# Patient Record
Sex: Male | Born: 1960 | Race: White | Hispanic: No | Smoking: Current every day smoker
Health system: Southern US, Community
[De-identification: ages and names within clinical notes are randomized; demographics above are authoritative.]

## PROBLEM LIST (undated history)

## (undated) DIAGNOSIS — J449 Chronic obstructive pulmonary disease, unspecified: Secondary | ICD-10-CM

## (undated) DIAGNOSIS — F319 Bipolar disorder, unspecified: Secondary | ICD-10-CM

## (undated) DIAGNOSIS — Z8701 Personal history of pneumonia (recurrent): Secondary | ICD-10-CM

## (undated) DIAGNOSIS — G8929 Other chronic pain: Secondary | ICD-10-CM

## (undated) DIAGNOSIS — M719 Bursopathy, unspecified: Secondary | ICD-10-CM

## (undated) DIAGNOSIS — F419 Anxiety disorder, unspecified: Secondary | ICD-10-CM

## (undated) DIAGNOSIS — D6859 Other primary thrombophilia: Secondary | ICD-10-CM

## (undated) DIAGNOSIS — B1920 Unspecified viral hepatitis C without hepatic coma: Secondary | ICD-10-CM

## (undated) DIAGNOSIS — J189 Pneumonia, unspecified organism: Secondary | ICD-10-CM

## (undated) DIAGNOSIS — I2699 Other pulmonary embolism without acute cor pulmonale: Secondary | ICD-10-CM

## (undated) DIAGNOSIS — IMO0002 Reserved for concepts with insufficient information to code with codable children: Secondary | ICD-10-CM

## (undated) DIAGNOSIS — B1910 Unspecified viral hepatitis B without hepatic coma: Secondary | ICD-10-CM

## (undated) DIAGNOSIS — K219 Gastro-esophageal reflux disease without esophagitis: Secondary | ICD-10-CM

## (undated) DIAGNOSIS — K449 Diaphragmatic hernia without obstruction or gangrene: Secondary | ICD-10-CM

## (undated) DIAGNOSIS — I639 Cerebral infarction, unspecified: Secondary | ICD-10-CM

## (undated) DIAGNOSIS — F209 Schizophrenia, unspecified: Secondary | ICD-10-CM

## (undated) DIAGNOSIS — K7460 Unspecified cirrhosis of liver: Secondary | ICD-10-CM

## (undated) DIAGNOSIS — J45909 Unspecified asthma, uncomplicated: Secondary | ICD-10-CM

## (undated) DIAGNOSIS — M1990 Unspecified osteoarthritis, unspecified site: Secondary | ICD-10-CM

## (undated) HISTORY — DX: Other primary thrombophilia: D68.59

## (undated) HISTORY — PX: CHOLECYSTECTOMY: SHX55

## (undated) HISTORY — PX: LEG SURGERY: SHX1003

## (undated) HISTORY — DX: Other chronic pain: G89.29

## (undated) HISTORY — DX: Personal history of pneumonia (recurrent): Z87.01

## (undated) HISTORY — PX: HAND SURGERY: SHX662

## (undated) HISTORY — DX: Bipolar disorder, unspecified: F31.9

## (undated) HISTORY — PX: UMBILICAL HERNIA REPAIR: SHX196

---

## 1998-08-20 ENCOUNTER — Emergency Department (HOSPITAL_COMMUNITY): Admission: EM | Admit: 1998-08-20 | Discharge: 1998-08-20 | Payer: Self-pay | Admitting: Emergency Medicine

## 1998-08-20 ENCOUNTER — Encounter: Payer: Self-pay | Admitting: Emergency Medicine

## 1999-02-11 ENCOUNTER — Emergency Department (HOSPITAL_COMMUNITY): Admission: EM | Admit: 1999-02-11 | Discharge: 1999-02-11 | Payer: Self-pay | Admitting: Emergency Medicine

## 1999-08-31 ENCOUNTER — Emergency Department (HOSPITAL_COMMUNITY): Admission: EM | Admit: 1999-08-31 | Discharge: 1999-08-31 | Payer: Self-pay | Admitting: *Deleted

## 2003-12-18 ENCOUNTER — Emergency Department (HOSPITAL_COMMUNITY): Admission: EM | Admit: 2003-12-18 | Discharge: 2003-12-18 | Payer: Self-pay | Admitting: Emergency Medicine

## 2004-11-28 ENCOUNTER — Emergency Department (HOSPITAL_COMMUNITY): Admission: EM | Admit: 2004-11-28 | Discharge: 2004-11-28 | Payer: Self-pay | Admitting: Emergency Medicine

## 2005-08-22 ENCOUNTER — Emergency Department (HOSPITAL_COMMUNITY): Admission: EM | Admit: 2005-08-22 | Discharge: 2005-08-22 | Payer: Self-pay | Admitting: Emergency Medicine

## 2007-04-29 ENCOUNTER — Emergency Department (HOSPITAL_COMMUNITY): Admission: EM | Admit: 2007-04-29 | Discharge: 2007-04-29 | Payer: Self-pay | Admitting: Emergency Medicine

## 2007-11-26 ENCOUNTER — Emergency Department (HOSPITAL_COMMUNITY): Admission: EM | Admit: 2007-11-26 | Discharge: 2007-11-26 | Payer: Self-pay | Admitting: Emergency Medicine

## 2008-01-30 ENCOUNTER — Inpatient Hospital Stay (HOSPITAL_COMMUNITY): Admission: EM | Admit: 2008-01-30 | Discharge: 2008-02-03 | Payer: Self-pay | Admitting: Emergency Medicine

## 2008-02-14 ENCOUNTER — Emergency Department (HOSPITAL_COMMUNITY): Admission: EM | Admit: 2008-02-14 | Discharge: 2008-02-14 | Payer: Self-pay | Admitting: Emergency Medicine

## 2008-04-24 ENCOUNTER — Emergency Department (HOSPITAL_COMMUNITY): Admission: EM | Admit: 2008-04-24 | Discharge: 2008-04-24 | Payer: Self-pay | Admitting: Emergency Medicine

## 2008-10-18 ENCOUNTER — Emergency Department (HOSPITAL_COMMUNITY): Admission: EM | Admit: 2008-10-18 | Discharge: 2008-10-18 | Payer: Self-pay | Admitting: Emergency Medicine

## 2008-12-19 ENCOUNTER — Emergency Department (HOSPITAL_COMMUNITY): Admission: EM | Admit: 2008-12-19 | Discharge: 2008-12-20 | Payer: Self-pay | Admitting: Emergency Medicine

## 2009-03-03 ENCOUNTER — Emergency Department (HOSPITAL_COMMUNITY): Admission: EM | Admit: 2009-03-03 | Discharge: 2009-03-03 | Payer: Self-pay | Admitting: Emergency Medicine

## 2009-08-22 ENCOUNTER — Ambulatory Visit (HOSPITAL_COMMUNITY): Admission: RE | Admit: 2009-08-22 | Discharge: 2009-08-22 | Payer: Self-pay

## 2009-08-26 ENCOUNTER — Emergency Department (HOSPITAL_COMMUNITY): Admission: EM | Admit: 2009-08-26 | Discharge: 2009-08-26 | Payer: Self-pay | Admitting: Emergency Medicine

## 2009-10-31 ENCOUNTER — Ambulatory Visit (HOSPITAL_COMMUNITY)
Admission: RE | Admit: 2009-10-31 | Discharge: 2009-10-31 | Payer: Self-pay | Source: Home / Self Care | Admitting: General Surgery

## 2009-11-03 ENCOUNTER — Emergency Department (HOSPITAL_COMMUNITY): Admission: EM | Admit: 2009-11-03 | Discharge: 2009-11-03 | Payer: Self-pay | Admitting: Emergency Medicine

## 2010-01-22 ENCOUNTER — Inpatient Hospital Stay (HOSPITAL_COMMUNITY)
Admission: EM | Admit: 2010-01-22 | Discharge: 2010-01-26 | Payer: Self-pay | Source: Home / Self Care | Admitting: Emergency Medicine

## 2010-04-14 DIAGNOSIS — I2699 Other pulmonary embolism without acute cor pulmonale: Secondary | ICD-10-CM

## 2010-04-14 HISTORY — DX: Other pulmonary embolism without acute cor pulmonale: I26.99

## 2010-04-19 ENCOUNTER — Ambulatory Visit (HOSPITAL_COMMUNITY)
Admission: RE | Admit: 2010-04-19 | Discharge: 2010-04-19 | Payer: Self-pay | Source: Home / Self Care | Attending: General Surgery | Admitting: General Surgery

## 2010-06-16 ENCOUNTER — Emergency Department (HOSPITAL_COMMUNITY): Admission: EM | Admit: 2010-06-16 | Payer: Medicare (Managed Care) | Source: Home / Self Care

## 2010-06-24 LAB — BASIC METABOLIC PANEL
CO2: 26 mEq/L (ref 19–32)
Calcium: 9.1 mg/dL (ref 8.4–10.5)
Chloride: 105 mEq/L (ref 96–112)
GFR calc Af Amer: >60 mL/min (ref >60)
Sodium: 141 mEq/L (ref 135–145)

## 2010-06-24 LAB — CBC
Hemoglobin: 12.7 g/dL — ABNORMAL LOW (ref 13.0–17.0)
MCH: 30 pg (ref 26.0–34.0)
Platelets: 181 10*3/uL (ref 150–400)
RBC: 4.24 MIL/uL (ref 4.22–5.81)
WBC: 6.9 10*3/uL (ref 4.0–10.5)

## 2010-06-24 LAB — SURGICAL PCR SCREEN: Staphylococcus aureus: POSITIVE — AB

## 2010-06-26 LAB — DIFFERENTIAL
Basophils Relative: 0 % (ref 0–1)
Basophils Relative: 0 % (ref 0–1)
Eosinophils Absolute: 0.1 10*3/uL (ref 0.0–0.7)
Eosinophils Absolute: 0.1 10*3/uL (ref 0.0–0.7)
Eosinophils Absolute: 0.1 10*3/uL (ref 0.0–0.7)
Eosinophils Relative: 1 % (ref 0–5)
Eosinophils Relative: 2 % (ref 0–5)
Lymphocytes Relative: 17 % (ref 12–46)
Lymphocytes Relative: 22 % (ref 12–46)
Lymphocytes Relative: 24 % (ref 12–46)
Lymphs Abs: 1.2 10*3/uL (ref 0.7–4.0)
Lymphs Abs: 1.5 10*3/uL (ref 0.7–4.0)
Lymphs Abs: 1.5 10*3/uL (ref 0.7–4.0)
Lymphs Abs: 1.6 10*3/uL (ref 0.7–4.0)
Lymphs Abs: 1.8 10*3/uL (ref 0.7–4.0)
Monocytes Absolute: 0.6 10*3/uL (ref 0.1–1.0)
Monocytes Relative: 11 % (ref 3–12)
Monocytes Relative: 13 % — ABNORMAL HIGH (ref 3–12)
Monocytes Relative: 9 % (ref 3–12)
Neutro Abs: 4.6 10*3/uL (ref 1.7–7.7)
Neutro Abs: 4.6 10*3/uL (ref 1.7–7.7)
Neutro Abs: 5.6 10*3/uL (ref 1.7–7.7)
Neutro Abs: 6.4 10*3/uL (ref 1.7–7.7)
Neutrophils Relative %: 67 % (ref 43–77)
Neutrophils Relative %: 70 % (ref 43–77)
Neutrophils Relative %: 72 % (ref 43–77)

## 2010-06-26 LAB — HEPARIN LEVEL (UNFRACTIONATED)
Heparin Unfractionated: <0.1 IU/mL — ABNORMAL LOW (ref 0.30–0.70)
Heparin Unfractionated: <0.1 IU/mL — ABNORMAL LOW (ref 0.30–0.70)

## 2010-06-26 LAB — LUPUS ANTICOAGULANT PANEL
DRVVT: 41.7 secs (ref 36.2–44.3)
Lupus Anticoagulant: NOT DETECTED

## 2010-06-26 LAB — CULTURE, BLOOD (ROUTINE X 2)

## 2010-06-26 LAB — BETA-2-GLYCOPROTEIN I ABS, IGG/M/A
Beta-2 Glyco I IgG: 8 G Units (ref <20)
Beta-2-Glycoprotein I IgM: 7 M Units (ref <20)

## 2010-06-26 LAB — CBC
HCT: 36.2 % — ABNORMAL LOW (ref 39.0–52.0)
HCT: 37.1 % — ABNORMAL LOW (ref 39.0–52.0)
HCT: 38.2 % — ABNORMAL LOW (ref 39.0–52.0)
Hemoglobin: 11.9 g/dL — ABNORMAL LOW (ref 13.0–17.0)
Hemoglobin: 12.3 g/dL — ABNORMAL LOW (ref 13.0–17.0)
MCH: 30.9 pg (ref 26.0–34.0)
MCH: 31.1 pg (ref 26.0–34.0)
MCHC: 33.8 g/dL (ref 30.0–36.0)
MCV: 91.2 fL (ref 78.0–100.0)
MCV: 91.8 fL (ref 78.0–100.0)
Platelets: 144 10*3/uL — ABNORMAL LOW (ref 150–400)
Platelets: 189 10*3/uL (ref 150–400)
RBC: 3.8 MIL/uL — ABNORMAL LOW (ref 4.22–5.81)
RBC: 3.84 MIL/uL — ABNORMAL LOW (ref 4.22–5.81)
RBC: 3.98 MIL/uL — ABNORMAL LOW (ref 4.22–5.81)
RBC: 4.07 MIL/uL — ABNORMAL LOW (ref 4.22–5.81)
RBC: 4.16 MIL/uL — ABNORMAL LOW (ref 4.22–5.81)
RDW: 14.1 % (ref 11.5–15.5)
WBC: 7 10*3/uL (ref 4.0–10.5)
WBC: 7.5 10*3/uL (ref 4.0–10.5)
WBC: 8.6 10*3/uL (ref 4.0–10.5)
WBC: 9.1 10*3/uL (ref 4.0–10.5)

## 2010-06-26 LAB — PROTIME-INR
INR: 1.11 (ref 0.00–1.49)
INR: 1.34 (ref 0.00–1.49)

## 2010-06-26 LAB — CARDIOLIPIN ANTIBODIES, IGG, IGM, IGA
Anticardiolipin IgG: 5 GPL U/mL — ABNORMAL LOW (ref <23)
Anticardiolipin IgM: 8 MPL U/mL — ABNORMAL LOW (ref <11)

## 2010-06-26 LAB — BASIC METABOLIC PANEL
CO2: 26 mEq/L (ref 19–32)
Calcium: 8.5 mg/dL (ref 8.4–10.5)
Calcium: 8.6 mg/dL (ref 8.4–10.5)
Calcium: 8.7 mg/dL (ref 8.4–10.5)
Chloride: 107 mEq/L (ref 96–112)
Creatinine, Ser: 0.59 mg/dL (ref 0.4–1.5)
Creatinine, Ser: 0.67 mg/dL (ref 0.4–1.5)
GFR calc Af Amer: >60 mL/min (ref >60)
GFR calc Af Amer: >60 mL/min (ref >60)
GFR calc Af Amer: >60 mL/min (ref >60)
GFR calc Af Amer: >60 mL/min (ref >60)
GFR calc non Af Amer: >60 mL/min (ref >60)
GFR calc non Af Amer: >60 mL/min (ref >60)
Glucose, Bld: 104 mg/dL — ABNORMAL HIGH (ref 70–99)
Potassium: 3.7 mEq/L (ref 3.5–5.1)
Potassium: 3.8 mEq/L (ref 3.5–5.1)
Sodium: 135 mEq/L (ref 135–145)
Sodium: 142 mEq/L (ref 135–145)

## 2010-06-26 LAB — COMPREHENSIVE METABOLIC PANEL
ALT: 39 U/L (ref 0–53)
Albumin: 2.9 g/dL — ABNORMAL LOW (ref 3.5–5.2)
Alkaline Phosphatase: 44 U/L (ref 39–117)
BUN: 10 mg/dL (ref 6–23)
Chloride: 101 mEq/L (ref 96–112)
Glucose, Bld: 116 mg/dL — ABNORMAL HIGH (ref 70–99)
Potassium: 3.7 mEq/L (ref 3.5–5.1)
Sodium: 137 mEq/L (ref 135–145)
Total Bilirubin: 0.6 mg/dL (ref 0.3–1.2)
Total Protein: 6.7 g/dL (ref 6.0–8.3)

## 2010-06-26 LAB — PROTEIN C, TOTAL: Protein C, Total: 108 % (ref 70–140)

## 2010-06-29 LAB — CBC
HCT: 41.4 % (ref 39.0–52.0)
Hemoglobin: 15.1 g/dL (ref 13.0–17.0)
MCH: 31.1 pg (ref 26.0–34.0)
MCHC: 33.5 g/dL (ref 30.0–36.0)
Platelets: 146 10*3/uL — ABNORMAL LOW (ref 150–400)
Platelets: 182 10*3/uL (ref 150–400)
RBC: 4.84 MIL/uL (ref 4.22–5.81)
RDW: 13.6 % (ref 11.5–15.5)
WBC: 13.6 10*3/uL — ABNORMAL HIGH (ref 4.0–10.5)
WBC: 9.1 10*3/uL (ref 4.0–10.5)

## 2010-06-29 LAB — COMPREHENSIVE METABOLIC PANEL
Albumin: 3.5 g/dL (ref 3.5–5.2)
Alkaline Phosphatase: 63 U/L (ref 39–117)
BUN: 12 mg/dL (ref 6–23)
Calcium: 9.1 mg/dL (ref 8.4–10.5)
Glucose, Bld: 102 mg/dL — ABNORMAL HIGH (ref 70–99)
Potassium: 3.7 mEq/L (ref 3.5–5.1)
Sodium: 137 mEq/L (ref 135–145)
Total Protein: 7.5 g/dL (ref 6.0–8.3)

## 2010-06-29 LAB — URINALYSIS, ROUTINE W REFLEX MICROSCOPIC
Glucose, UA: NEGATIVE mg/dL
Leukocytes, UA: NEGATIVE
Specific Gravity, Urine: 1.025 (ref 1.005–1.030)
pH: 7.5 (ref 5.0–8.0)

## 2010-06-29 LAB — URINE MICROSCOPIC-ADD ON

## 2010-06-29 LAB — DIFFERENTIAL
Lymphs Abs: 2 10*3/uL (ref 0.7–4.0)
Monocytes Absolute: 1 10*3/uL (ref 0.1–1.0)
Monocytes Relative: 7 % (ref 3–12)
Neutro Abs: 10.5 10*3/uL — ABNORMAL HIGH (ref 1.7–7.7)
Neutrophils Relative %: 77 % (ref 43–77)

## 2010-06-29 LAB — HEPATIC FUNCTION PANEL
ALT: 38 U/L (ref 0–53)
AST: 23 U/L (ref 0–37)
Albumin: 4 g/dL (ref 3.5–5.2)
Alkaline Phosphatase: 56 U/L (ref 39–117)
Total Protein: 7 g/dL (ref 6.0–8.3)

## 2010-06-29 LAB — BASIC METABOLIC PANEL
CO2: 25 mEq/L (ref 19–32)
Calcium: 9.6 mg/dL (ref 8.4–10.5)
GFR calc Af Amer: >60 mL/min (ref >60)
GFR calc non Af Amer: >60 mL/min (ref >60)
Sodium: 137 mEq/L (ref 135–145)

## 2010-06-29 LAB — URINE CULTURE: Colony Count: 1000

## 2010-07-01 LAB — DIFFERENTIAL
Eosinophils Absolute: 0.1 10*3/uL (ref 0.0–0.7)
Lymphocytes Relative: 7 % — ABNORMAL LOW (ref 12–46)
Lymphs Abs: 1 10*3/uL (ref 0.7–4.0)
Neutro Abs: 12 10*3/uL — ABNORMAL HIGH (ref 1.7–7.7)
Neutrophils Relative %: 86 % — ABNORMAL HIGH (ref 43–77)

## 2010-07-01 LAB — URINALYSIS, ROUTINE W REFLEX MICROSCOPIC
Glucose, UA: NEGATIVE mg/dL
Protein, ur: NEGATIVE mg/dL
Specific Gravity, Urine: 1.02 (ref 1.005–1.030)

## 2010-07-01 LAB — BASIC METABOLIC PANEL
BUN: 18 mg/dL (ref 6–23)
Calcium: 9.1 mg/dL (ref 8.4–10.5)
Creatinine, Ser: 0.86 mg/dL (ref 0.4–1.5)
GFR calc Af Amer: >60 mL/min (ref >60)
GFR calc non Af Amer: >60 mL/min (ref >60)

## 2010-07-01 LAB — CBC
MCV: 93.1 fL (ref 78.0–100.0)
Platelets: 136 10*3/uL — ABNORMAL LOW (ref 150–400)
RBC: 4.3 MIL/uL (ref 4.22–5.81)
WBC: 14 10*3/uL — ABNORMAL HIGH (ref 4.0–10.5)

## 2010-07-19 LAB — RAPID STREP SCREEN (MED CTR MEBANE ONLY): Streptococcus, Group A Screen (Direct): NEGATIVE

## 2010-07-19 LAB — CBC
Hemoglobin: 14.7 g/dL (ref 13.0–17.0)
Platelets: 115 10*3/uL — ABNORMAL LOW (ref 150–400)
RDW: 13 % (ref 11.5–15.5)
WBC: 11.9 10*3/uL — ABNORMAL HIGH (ref 4.0–10.5)

## 2010-07-19 LAB — ETHANOL: Alcohol, Ethyl (B): <5 mg/dL (ref 0–10)

## 2010-07-19 LAB — DIFFERENTIAL
Basophils Absolute: 0 10*3/uL (ref 0.0–0.1)
Lymphocytes Relative: 11 % — ABNORMAL LOW (ref 12–46)
Lymphs Abs: 1.3 10*3/uL (ref 0.7–4.0)
Monocytes Absolute: 0.6 10*3/uL (ref 0.1–1.0)
Neutro Abs: 9.9 10*3/uL — ABNORMAL HIGH (ref 1.7–7.7)

## 2010-07-19 LAB — POCT I-STAT, CHEM 8
BUN: 14 mg/dL (ref 6–23)
Calcium, Ion: 1.14 mmol/L (ref 1.12–1.32)
Creatinine, Ser: 1 mg/dL (ref 0.4–1.5)
Glucose, Bld: 101 mg/dL — ABNORMAL HIGH (ref 70–99)
TCO2: 23 mmol/L (ref 0–100)

## 2010-07-19 LAB — RAPID URINE DRUG SCREEN, HOSP PERFORMED
Cocaine: NOT DETECTED
Opiates: NOT DETECTED

## 2010-07-24 ENCOUNTER — Emergency Department (HOSPITAL_COMMUNITY)
Admission: EM | Admit: 2010-07-24 | Discharge: 2010-07-24 | Disposition: A | Discharge status: Home / Self Care | Payer: Medicare (Managed Care) | Attending: Emergency Medicine | Admitting: Emergency Medicine

## 2010-07-24 DIAGNOSIS — F17200 Nicotine dependence, unspecified, uncomplicated: Secondary | ICD-10-CM | POA: Insufficient documentation

## 2010-07-24 DIAGNOSIS — R42 Dizziness and giddiness: Secondary | ICD-10-CM | POA: Insufficient documentation

## 2010-07-24 DIAGNOSIS — E876 Hypokalemia: Secondary | ICD-10-CM | POA: Insufficient documentation

## 2010-07-24 DIAGNOSIS — M549 Dorsalgia, unspecified: Secondary | ICD-10-CM | POA: Insufficient documentation

## 2010-07-24 DIAGNOSIS — K219 Gastro-esophageal reflux disease without esophagitis: Secondary | ICD-10-CM | POA: Insufficient documentation

## 2010-07-24 DIAGNOSIS — I2699 Other pulmonary embolism without acute cor pulmonale: Secondary | ICD-10-CM | POA: Insufficient documentation

## 2010-07-24 DIAGNOSIS — Z8739 Personal history of other diseases of the musculoskeletal system and connective tissue: Secondary | ICD-10-CM | POA: Insufficient documentation

## 2010-07-24 DIAGNOSIS — R112 Nausea with vomiting, unspecified: Secondary | ICD-10-CM | POA: Insufficient documentation

## 2010-07-24 DIAGNOSIS — G8929 Other chronic pain: Secondary | ICD-10-CM | POA: Insufficient documentation

## 2010-07-24 LAB — BASIC METABOLIC PANEL
Chloride: 111 mEq/L (ref 96–112)
Creatinine, Ser: 0.66 mg/dL (ref 0.4–1.5)
GFR calc Af Amer: >60 mL/min (ref >60)
GFR calc non Af Amer: >60 mL/min (ref >60)
Potassium: 3.2 mEq/L — ABNORMAL LOW (ref 3.5–5.1)

## 2010-07-24 LAB — URINALYSIS, ROUTINE W REFLEX MICROSCOPIC
Glucose, UA: NEGATIVE mg/dL
Protein, ur: NEGATIVE mg/dL
Specific Gravity, Urine: 1.015 (ref 1.005–1.030)
pH: 7 (ref 5.0–8.0)

## 2010-07-24 LAB — CBC
MCH: 30.2 pg (ref 26.0–34.0)
Platelets: 207 10*3/uL (ref 150–400)
RBC: 4.74 MIL/uL (ref 4.22–5.81)
WBC: 5.8 10*3/uL (ref 4.0–10.5)

## 2010-07-24 LAB — DIFFERENTIAL
Eosinophils Absolute: 0.1 10*3/uL (ref 0.0–0.7)
Lymphocytes Relative: 26 % (ref 12–46)
Lymphs Abs: 1.5 10*3/uL (ref 0.7–4.0)
Neutrophils Relative %: 66 % (ref 43–77)

## 2010-07-24 LAB — PROTIME-INR
INR: 2.31 — ABNORMAL HIGH (ref 0.00–1.49)
Prothrombin Time: 25.5 seconds — ABNORMAL HIGH (ref 11.6–15.2)

## 2010-08-27 NOTE — Group Therapy Note (Signed)
NAMEMACARI, ZALESKY                 ACCOUNT NO.:  [id number]   MEDICAL RECORD NO.:  [id number]          PATIENT TYPE:  INP   LOCATION:  A341                          FACILITY:  [facility]   PHYSICIAN:  Edward L. Juanetta Gosling, M.D.DATE OF BIRTH:  24-Aug-1960   DATE OF PROCEDURE:  DATE OF DISCHARGE:                                 PROGRESS NOTE   PATIENT OF:  [facility].   Mr. Hasten is admitted with pneumonia.  He has been in isolation because  of concerns about a cavity.  I do not think that is necessary after his  CT.   His temperature is 98.1, pulse 45, respirations 22, blood pressure  128/88, O2 sats 93% on room air.   His respiratory cultures so far are normal oropharyngeal flora.   White count 9000, hemoglobin 12, platelets 182.  BMET essentially  normal.  Glucose was 121.  AFB smear is negative so far, and blood  cultures are negative so far.   I think with him having pneumonia by CT, some reactive adenopathy, with  no pulmonary mass, and no cavity, I do not think we need to have him  necessarily in isolation.  Otherwise, we would continue his treatments  and follow.      Edward L. Juanetta Gosling, M.D.  Electronically Signed     ELH/MEDQ  D:  02/02/2008  T:  02/02/2008  Job:  [id number]

## 2010-08-27 NOTE — Consult Note (Signed)
[name], [name]                 ACCOUNT NO.:  [id number]   MEDICAL RECORD NO.:  [id number]          PATIENT TYPE:  INP   LOCATION:  IC08                          FACILITY:  [facility]   PHYSICIAN:  Edward L. Juanetta Gosling, M.D.DATE OF BIRTH:  1960/07/15   DATE OF CONSULTATION:  DATE OF DISCHARGE:                                 CONSULTATION   SUBJECTIVE:  [name] is a 50 year old who came to the emergency room  because of cough.  He says he has had this for several days.  It has  been getting worse.  He says he has had multiple bouts of pneumonia in  the past and he was concerned that he had pneumonia.  He has had fever,  he says to a 105.  He has coughed up a brownish red sputum.  He has had  headache and myalgias.  He did not have any chest pain or he did have  chills.  He had fever and he is coughing up very rusty brown sputum.   PAST MEDICAL HISTORY:  Positive for what is apparently osteoarthritis,  gastroesophageal reflux disease, and previous bouts of pneumonia.  He  has been to [facility] on at least two occasions and there is some question  about whether he had some sort of a abnormality on his chest x-ray.  We  are going to need to get those films.   SOCIAL HISTORY:  He does not use any alcohol or illicit drugs.  He  smokes about a pack or more of cigarettes daily.  He is on no  prescription medications.   REVIEW OF SYSTEMS:  Except as mentioned is negative.   PHYSICAL EXAMINATION:  GENERAL:  Well-developed and well-nourished male  who does not appear to be in any acute distress, but he feels much  better.  VITAL SIGNS:  His pulses in the 90s, blood pressure 106/53, respirations  16, and O2 sats 96%.  HEENT:  His pupils are reactive .  His nose and throat are clear.  NECK:  Supple.  CHEST:  Rhonchi bilaterally.  HEART:  Regular.  ABDOMEN:  Soft.   LABORATORY DATA:  Lab work this morning blood gas on 2 liters pO2 is 62,  pCO2 of 34, and pH 7.35.  Blood gas yesterday showed no  more problem  with oxygenation.  White count was 9800, hemoglobin 13.3, platelets 114  compared to a white count of 17,500 yesterday.  Common Metabolic profile  showed a glucose of 167, potassium of 3.2, and albumin was 2.7.  Cardiac  panel was normal.  Magnesium 2.2, phosphorus 1.4.  PT and INR normal.  Chest x-ray shows no left upper lobe air space disease with some  cavitation.  He has had previous x-rays, which showed  some problems, so we need to try to get that information from [place].  If  he has a cavitary pneumonia, and has been there before that would bring  to my and the possibility of fungal disease, TB, etc.  I think, we  should continue with his chronic treatments with medications for now.  Edward L. Juanetta Gosling, M.D.  Electronically Signed     ELH/MEDQ  D:  01/31/2008  T:  01/31/2008  Job:  [id number]

## 2010-08-27 NOTE — Group Therapy Note (Signed)
[name], [name]                 ACCOUNT NO.:  [id number]   MEDICAL RECORD NO.:  [id number]          PATIENT TYPE:  INP   LOCATION:  A341                          FACILITY:  [facility]   PHYSICIAN:  Edward L. Juanetta Gosling, M.D.DATE OF BIRTH:  July 19, 1960   DATE OF PROCEDURE:  DATE OF DISCHARGE:                                 PROGRESS NOTE   [name] is improving.  He still remains in the respiratory isolation.   His temperature is 97.7, pulse 66, respirations 20, blood pressure  118/71.  He has one AFB smear that is negative so far.  I think it is  unlikely that this represents tuberculosis and I do not think he has to  stay here until three AFBs are done, if he is otherwise okay to go.  I  am going to plan to follow more peripherally.   Thank you very much for allowing me to see him with you.      Edward L. Juanetta Gosling, M.D.  Electronically Signed     ELH/MEDQ  D:  02/03/2008  T:  02/03/2008  Job:  [id number]

## 2010-08-27 NOTE — Group Therapy Note (Signed)
NAMEALPER, GUILMETTE NO.:  [id number]   MEDICAL RECORD NO.:  [id number]          PATIENT TYPE:  INP   LOCATION:  A341                          FACILITY:  [facility]   PHYSICIAN:  Dorris Singh, DO    DATE OF BIRTH:  [birth year]   DATE OF PROCEDURE:  02/01/2008  DATE OF DISCHARGE:                                 PROGRESS NOTE   The patient seen today in the ICU and transferred to the floor,  continuing to do well.  At this point in time, it was determined that  the patient could be discharged to the general floor in an isolation  room.  We have obtained 3 sputums for his AFB, and we will continue to  monitor this with a concern of possible TB.  However, the patient has  improved greatly and we will continue to monitor him.   VITAL SIGNS:  Blood pressure 124/86, temperature 97.2, pulse 93,  respirations 20.   PHYSICAL EXAM:  GENERAL:  The patient is a 50 year old Caucasian male  who is well-developed, well-nourished in no acute distress.  HEART:  Regular rate and rhythm.  LUNGS:  Clear to auscultation bilaterally.  Decreased breath sounds,  however.  ABDOMEN:  Soft, nontender, nondistended.  EXTREMITIES:  Positive pulses.   White count today is 10.6, hemoglobin 11.8, hematocrit 34.7, platelet  count of 149.  His sodium is 139, potassium 3.5, chloride 113, glucose  159, BUN 14, creatinine 0.61.   ASSESSMENT AND PLAN:  1. Respiratory distress.  The patient is improved.  2. Pneumonia.  The patient is improved clinically as well.  He is able      to be transported out of the ICU.  3. Suspicion for tuberculosis.  The patient has a CT ordered, but I      have not seen the CT done, however.  I will double-check to see if      it is going to be accomplished or not.  Will continue to follow the      patient and change therapy as necessary.      Dorris Singh, DO  Electronically Signed     CB/MEDQ  D:  02/01/2008  T:  02/01/2008  Job:  [id number]

## 2010-08-27 NOTE — H&P (Signed)
NAMEZYAIR, RUSSI NO.:  [id number]   MEDICAL RECORD NO.:  [id number]          PATIENT TYPE:  INP   LOCATION:  IC09                          FACILITY:  [facility]   PHYSICIAN:  Dorris Singh, DO    DATE OF BIRTH:  [birth year]   DATE OF ADMISSION:  01/30/2008  DATE OF DISCHARGE:  LH                              HISTORY & PHYSICAL   The patient is a 50 year old Caucasian male who presented to the [facility] emergency room with a chief complaint of cough.  States the cough  has been persistent for the last 2 days.  He also has had fever and has  been getting worse.  He also is complaining of a brown to red sputum and  headache and diffuse myalgias.  Also has sick contacts in the family but  states this is the worst he has felt in a long time.  Nothing was making  it better, everything was making it worse.  He does deny any chest pain.  Positive for nausea.  Negative for vomiting.  No diarrhea.   PAST MEDICAL HISTORY:  Significant for arthritis, GERD, hiatal hernia  and pneumonia.   SOCIAL HISTORY:  He currently is a nondrinker.  No drug abuse but he  does smoke.  He does not have a travel history.   ALLERGIES:  No known drug allergies.   MEDICATIONS:  Currently he has been taking medications Tylenol over-the-  counter as needed and per directed.   REVIEW OF SYSTEMS:  CONSTITUTIONAL:  Positive for fever, chills and  appetite change.  EYES:  Negative.  EARS, NOSE, MOUTH, THROAT:  Positive  sore throat.  HEART:  Negative chest pain, palpitations.  LUNGS:  Positive crackles.  ABDOMEN:  Nausea.  MUSCULOSKELETAL:  Positive  myalgias.  GU:  Negative.   PHYSICAL EXAM:  VITAL SIGNS:  Blood pressure 131/74, pulse rate 96,  respirations 24, temperature 99.  GENERAL:  The patient is a well-developed, well-nourished Caucasian male  who is currently in mild distress.  HEENT:  Head is normocephalic, atraumatic.  Eyes are PERRLA.  EOMI.  PMI  is visualized.  No erythema.   Nose turbinates are moist.  Mouth, the  patient currently is on nonrebreather but mouth is clear.  Mucous  membranes are dry.  NECK:  Neck is supple.  Full range of motion.  No lymphadenopathy noted.  No meningeal signs.  CARDIOVASCULAR:  Tachycardic.  No murmur, rub or gallop noted.  RESPIRATORY:  There are decreased breath sounds particularly in the  right upper lobe.  However, clear to auscultation of the left lobe.  ABDOMEN:  Soft, nontender, nondistended.  No organomegaly noted.  Bowel  sounds x all four quadrants.  EXTREMITIES:  Positive pulses.  No ecchymosis, edema or cyanosis.  Good  strength, good turgor in all four extremities.  NEUROLOGICAL:  Cranial nerves II-XII grossly intact.  The patient is  appropriate and normal speech.  A and O x3.  SKIN:  Good turgor, good texture.   Testing that was done include an x-ray which stated likely cavitary like  lesion in the right lung, likely pneumonia with some atelectasis, hilar  adenopathy.  Close followup was recommended as a component of malignancy  cannot be excluded, however, this most likely represents cavitary  pneumonia.  Consider chest CT if this does not resolved.  Streaky  opacity and bronchial thickening in the left lower lobe may represent an  additional component of infection bronchitis on the left.  White count  is 17.5, hemoglobin 15.4, hematocrit 45.3, platelet count of 143.  BMP  is sodium 135, potassium 3.9, chloride 104, carbon dioxide 22, glucose  161, BUN 16, creatinine 0.7.   ASSESSMENT:  1. Right lobe pneumonia.  2. Respiratory distress.  3. Possible sepsis.  4. Tobacco abuse.   PLAN:  Will be to admit the patient to the service of [facility].  We  will place him in the ICU under droplet precaution.  We will try to keep  his sats above 90% using Venti and nonrebreather BiPAP and if necessary  mechanical ventilation.  Also will put him on IV fluids giving him a 1  liter bolus followed by a rate of 150  mL/hour.  Will check sputum  culture and sensitivity, Gram stain and AFBs x3.  Will repeat chest x-  ray in the morning.  Due to the patient's respiratory condition will go  ahead and place him on steroids q.8 and start him on vancomycin with  pharmacy dosing of Rocephin.  Will place him on an antitussive as well  as antiemetics and breathing treatments.  We will also counsel the  patient on smoking cessation as well.  Will consider a CT of his chest  as he improves from a respiratory standpoint over the next several days.  We will continue to monitor the patient and change therapy as necessary.  All this has been discussed with the patient and he understands the  nature of his admission.      Dorris Singh, DO  Electronically Signed     CB/MEDQ  D:  01/30/2008  T:  01/30/2008  Job:  [id number]

## 2010-08-27 NOTE — Group Therapy Note (Signed)
NAMEJUANDIEGO, KOLENOVIC                 ACCOUNT NO.:  [id number]   MEDICAL RECORD NO.:  [id number]          PATIENT TYPE:  INP   LOCATION:  IC08                          FACILITY:  [facility]   PHYSICIAN:  Edward L. Juanetta Gosling, M.D.DATE OF BIRTH:  [birth year]   DATE OF PROCEDURE:  DATE OF DISCHARGE:                                 PROGRESS NOTE   Mr. Mainwaring is the patient [employer], and he is admitted  with pneumonia.  It was concerned that he has had some sort of a chronic  infiltrative mass on chest x-ray with some cavitation, but it does not  show that on CT.  He does show what appears to be bilateral pneumonia.  He says he is much better.  He has had trouble with his IV through the  night, and he is an actually disconnect his monitor right now.   His blood pressure 143/71.  His respirations are about 14.  He looks  pretty comfortable.  His chest is clear.  His O2 sats 100% on 2 liters.  Marland Kitchen  His white blood count this morning is down to 10,600, hemoglobin  11.8, platelets 149,000 and his BMET shows BUN 14, creatinine 0.61,  respiratory culture reintubated for better growth.  Blood cultures thus  far negative.   ASSESSMENT:  __________ that he has bilateral pneumonia.  He was quite  sick when came to the hospital, but he has made a remarkable  improvement.  Comparison of the x-ray best as I can tell from the x-ray  report is that he had a pneumonia not a mass, TB, cavitation etc.  On  his previous films and his CT does not show any of that now.  I do not  plan to change anything from our doing I think he has got orders written  for transfer to a regular __________ today.      Edward L. Juanetta Gosling, M.D.  Electronically Signed     ELH/MEDQ  D:  02/01/2008  T:  02/01/2008  Job:  [id number]

## 2010-08-27 NOTE — Discharge Summary (Signed)
NAMEURIAH, PHILIPSON [name].:  [id number]   MEDICAL RECORD [name].:  [id number]          PATIENT TYPE:  INP   LOCATION:  A341                          FACILITY:  [facility]   PHYSICIAN:  Monte Fantasia, MD  DATE OF BIRTH:  1960-05-01   DATE OF ADMISSION:  01/30/2008  DATE OF DISCHARGE:  10/22/2009LH                               DISCHARGE SUMMARY   Mr. [name] was admitted on January 30, 2008, for the bilateral cavitary  pneumonia and respiratory failure.  The patient was started on  vancomycin and gradually has symptomatically improved.  CT scan, which  was done showed bilateral cavitary pneumonia.  Three of these were sent  out of which 2 have came back as negative as per pulmonary followup is  appreciated and will discontinued the airborne isolation at present and  the patient has been since symptomatically better and can be discharged  home.   VITALS:  Today morning, the patient's temperature has been 97.9 with  heart rate of 57, respirations of 20, blood pressure 127/80, and 93%  oxygenation on room air.  HEENT:  Neck supple.  [name] pallor.  Pupils equally reacting to light.  [name]  icterus.  [name] lymphadenopathy.  ABDOMEN:  Soft, [name] guarding, [name] rebound, [name] rigidity.  [name] organomegaly.  [name] masses felt.  RESPIRATORY:  Air entry is bilaterally equal.  [name] rales, rhonchi, or  vesicular breath sounds on both sides.  Bilateral lung fields.  CARDIOVASCULAR:  Heart sounds S1 and S2 heard.  Regular rate and rhythm.  EXTREMITIES:  [name] edema.   LABORATORY DATA:  Total WBC count of 9 with an H and H of 12.1/35,  platelet count of 182, and neutrophils of 77.  BMET is sodium of 139,  potassium of 3.9, chloride 111, bicarb of 23, glucose 121, BUN 13, and  creatinine 0.6.  Blood cultures done on January 30, 2008, 2 sets have  been [name] growth for last 4 days.  Respiratory cultures showed normal  oropharyngeal flora.  AFB is negative done on January 31, 2008.   ASSESSMENT AND PLAN:   Bilateral cavitary pneumonia resolved.  The  patient advised to take medications as per the discharge instructions .  The patient discharged on Bactim. We will schedule an appointment with  the primary care physician to follow up with primary care physician.  The patient is fit to be discharged and will follow up with the primary  care physician in 1 week.      Monte Fantasia, MD  Electronically Signed     MP/MEDQ  D:  02/03/2008  T:  02/04/2008  Job:  [id number]

## 2010-08-27 NOTE — Group Therapy Note (Signed)
NAMEHALSEY, HAMMEN NO.:  [id number]   MEDICAL RECORD NO.:  [id number]          PATIENT TYPE:  INP   LOCATION:  A341                          FACILITY:  [facility]   PHYSICIAN:  Monte Fantasia, MD  DATE OF BIRTH:  1961/01/02   DATE OF PROCEDURE:  02/02/2008  DATE OF DISCHARGE:                                 PROGRESS NOTE   HISTORY:  Mr. Raybourn was admitted on January 30, 2008 for pneumonia and  respiratory failure.  The patient was started on vancomycin, was  admitted in the ICU, and has gradually symptomatically improved and has  done much better.  At present, the patient is awaiting the sputum  reports for AFBs x2.  One sputum AFB has been negative.  The patient  denies any chest pain, shortness of breath, and has occasional cough  with productive phlegm.  No complaints of any nausea, vomiting, or belly  pain.   PHYSICAL EXAMINATION:  VITALS:  Temperature today is 98.1 with pulse of  45, respirations of 18, blood pressure 128/88, and oxygen saturations  93%.  HEENT/NECK:  Supple.  No pallor.  No icterus.  No lymphadenopathy.  No  thyromegaly.  RESPIRATORY:  Air entry is bilaterally equal.  No rales.  No rhonchi or  rubs.  CVS:  S1 and S2 plus.  ABDOMEN:  Soft.  No organomegaly.  No distension.  No rigidity.  Bowel  sounds are good.  EXTREMITIES:  There is no edema.   LABORATORY DATA:  WBC is 9, H&H 12.1/35, platelets of 182, neutrophil  count of 77%.  Sodium of 139, potassium 3.9, chloride 111, bicarb of 23,  glucose 121, BUN 13, creatinine 0.6, and calcium of 8.6.  AFB x1  negative.  Two AFBs pending.   ASSESSMENT AND PLAN:  Pneumonia.  The patient is recovering well, but  symptomatically.  No high white count at present.  Acid-fast bacilli x1  is negative.  The patient awaiting 2 acid-fast bacilli smears.  Deep  vein thrombosis and gastrointestinal prophylaxis on board.      Monte Fantasia, MD  Electronically Signed     MP/MEDQ  D:   02/02/2008  T:  02/03/2008  Job:  [id number]

## 2010-08-30 NOTE — Group Therapy Note (Signed)
NAMEAUBURN, HERT NO.:  [id number]   MEDICAL RECORD NO.:  [id number]          PATIENT TYPE:  EMS   LOCATION:  MAJO                         FACILITY:  [facility]   PHYSICIAN:  Osvaldo Shipper, MD     DATE OF BIRTH:  [birth year]   DATE OF PROCEDURE:  DATE OF DISCHARGE:  02/14/2008                                 PROGRESS NOTE   DISCUSSION OF RESULTS  I was called by [facility], yesterday, March 16, 2008, regarding  Mr. [name]'s sputum cultures.  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  This is from sputum that  was obtained on January 31, 2008.  At that time, the patient was  admitted to the [facility] service, but he was admitted by Dr. Elige Radon,  and Dr. Juanetta Gosling was seeing the patient in consultation.  The patient was  admitted for pneumonia.  He was discharged by Dr. Kathryne Hitch on Bactrim.   I informed Dr. Juanetta Gosling regarding these results, and he told me that he  would look at patient's reports and his chart to make further  determination.  I will discuss this case again with him in the next  couple of days.   ADDENDUM:  Dr. Juanetta Gosling informed me that he will contact the patient and will make  an appointment to see him.      Osvaldo Shipper, MD  Electronically Signed     GK/MEDQ  D:  03/17/2008  T:  03/17/2008  Job:  [id number]   cc:   Ramon Dredge L. Juanetta Gosling, M.D.  Fax: [phone]

## 2010-09-04 ENCOUNTER — Inpatient Hospital Stay (HOSPITAL_COMMUNITY): Payer: Medicare (Managed Care)

## 2010-09-04 ENCOUNTER — Emergency Department (HOSPITAL_COMMUNITY): Payer: Medicare (Managed Care)

## 2010-09-04 ENCOUNTER — Inpatient Hospital Stay (HOSPITAL_COMMUNITY)
Admission: EM | Admit: 2010-09-04 | Discharge: 2010-09-05 | DRG: 074 | Disposition: A | Discharge status: Home / Self Care | Payer: Medicare (Managed Care) | Attending: Internal Medicine | Admitting: Internal Medicine

## 2010-09-04 DIAGNOSIS — G894 Chronic pain syndrome: Secondary | ICD-10-CM | POA: Diagnosis present

## 2010-09-04 DIAGNOSIS — K219 Gastro-esophageal reflux disease without esophagitis: Secondary | ICD-10-CM | POA: Diagnosis present

## 2010-09-04 DIAGNOSIS — D6859 Other primary thrombophilia: Secondary | ICD-10-CM | POA: Diagnosis present

## 2010-09-04 DIAGNOSIS — J45909 Unspecified asthma, uncomplicated: Secondary | ICD-10-CM | POA: Diagnosis present

## 2010-09-04 DIAGNOSIS — G5630 Lesion of radial nerve, unspecified upper limb: Principal | ICD-10-CM | POA: Diagnosis present

## 2010-09-04 DIAGNOSIS — F411 Generalized anxiety disorder: Secondary | ICD-10-CM | POA: Diagnosis present

## 2010-09-04 DIAGNOSIS — Z7901 Long term (current) use of anticoagulants: Secondary | ICD-10-CM

## 2010-09-04 DIAGNOSIS — Z86718 Personal history of other venous thrombosis and embolism: Secondary | ICD-10-CM

## 2010-09-04 LAB — CK TOTAL AND CKMB (NOT AT ARMC): CK, MB: 1.5 ng/mL (ref 0.3–4.0)

## 2010-09-04 LAB — CBC
MCH: 30.1 pg (ref 26.0–34.0)
MCHC: 33.8 g/dL (ref 30.0–36.0)
MCV: 89.1 fL (ref 78.0–100.0)
Platelets: 261 10*3/uL (ref 150–400)
RDW: 13.8 % (ref 11.5–15.5)
WBC: 6.2 10*3/uL (ref 4.0–10.5)

## 2010-09-04 LAB — CARDIAC PANEL(CRET KIN+CKTOT+MB+TROPI): CK, MB: 1.8 ng/mL (ref 0.3–4.0)

## 2010-09-04 LAB — TROPONIN I: Troponin I: <0.3 ng/mL (ref <0.30)

## 2010-09-04 LAB — GLUCOSE, CAPILLARY

## 2010-09-04 LAB — DIFFERENTIAL
Eosinophils Absolute: 0.1 10*3/uL (ref 0.0–0.7)
Eosinophils Relative: 1 % (ref 0–5)
Lymphs Abs: 1.4 10*3/uL (ref 0.7–4.0)
Monocytes Absolute: 0.3 10*3/uL (ref 0.1–1.0)
Monocytes Relative: 6 % (ref 3–12)

## 2010-09-04 LAB — COMPREHENSIVE METABOLIC PANEL
AST: 16 U/L (ref 0–37)
Albumin: 3.1 g/dL — ABNORMAL LOW (ref 3.5–5.2)
Alkaline Phosphatase: 64 U/L (ref 39–117)
Chloride: 104 mEq/L (ref 96–112)
GFR calc Af Amer: >60 mL/min (ref >60)
Potassium: 3.8 mEq/L (ref 3.5–5.1)
Sodium: 137 mEq/L (ref 135–145)
Total Bilirubin: 0.2 mg/dL — ABNORMAL LOW (ref 0.3–1.2)
Total Protein: 7.4 g/dL (ref 6.0–8.3)

## 2010-09-05 DIAGNOSIS — I369 Nonrheumatic tricuspid valve disorder, unspecified: Secondary | ICD-10-CM

## 2010-09-05 LAB — CARDIAC PANEL(CRET KIN+CKTOT+MB+TROPI)
CK, MB: 1.8 ng/mL (ref 0.3–4.0)
CK, MB: 1.5 ng/mL (ref 0.3–4.0)
Total CK: 25 U/L (ref 7–232)
Troponin I: <0.3 ng/mL (ref <0.30)

## 2010-09-05 LAB — COMPREHENSIVE METABOLIC PANEL
ALT: 14 U/L (ref 0–53)
AST: 13 U/L (ref 0–37)
Alkaline Phosphatase: 60 U/L (ref 39–117)
Calcium: 9.6 mg/dL (ref 8.4–10.5)
GFR calc Af Amer: >60 mL/min (ref >60)
Glucose, Bld: 112 mg/dL — ABNORMAL HIGH (ref 70–99)
Potassium: 3.5 mEq/L (ref 3.5–5.1)
Sodium: 138 mEq/L (ref 135–145)
Total Protein: 6.3 g/dL (ref 6.0–8.3)

## 2010-09-05 LAB — DIFFERENTIAL
Basophils Relative: 1 % (ref 0–1)
Eosinophils Absolute: 0.3 10*3/uL (ref 0.0–0.7)
Lymphs Abs: 2.3 10*3/uL (ref 0.7–4.0)
Monocytes Relative: 8 % (ref 3–12)
Neutro Abs: 2.3 10*3/uL (ref 1.7–7.7)
Neutrophils Relative %: 43 % (ref 43–77)

## 2010-09-05 LAB — CBC
Hemoglobin: 12.3 g/dL — ABNORMAL LOW (ref 13.0–17.0)
MCH: 30.2 pg (ref 26.0–34.0)
MCV: 88.7 fL (ref 78.0–100.0)
Platelets: 238 10*3/uL (ref 150–400)
RBC: 4.07 MIL/uL — ABNORMAL LOW (ref 4.22–5.81)
WBC: 5.4 10*3/uL (ref 4.0–10.5)

## 2010-09-05 LAB — HEMOGLOBIN A1C
Hgb A1c MFr Bld: 5.2 % (ref <5.7)
Mean Plasma Glucose: 103 mg/dL (ref <117)

## 2010-09-05 LAB — LIPID PANEL: VLDL: 18 mg/dL (ref 0–40)

## 2010-09-05 LAB — PROTIME-INR: Prothrombin Time: 23.7 seconds — ABNORMAL HIGH (ref 11.6–15.2)

## 2010-09-09 NOTE — Discharge Summary (Signed)
NAMEJOVONNI, [name] NO.:  [id number]  MEDICAL RECORD NO.:  [id number]           PATIENT TYPE:  I  LOCATION:  A336                          FACILITY:  [facility]  PHYSICIAN:  Jeoffrey Massed, MD    DATE OF BIRTH:  12/07/1960  DATE OF ADMISSION:  09/04/2010 DATE OF DISCHARGE:  LH                         DISCHARGE SUMMARY-REFERRING   PRIMARY CARE PRACTITIONER:  Dr. Jeanella Cara at [facility] in [place].  PRIMARY DISCHARGE DIAGNOSIS:  Left radial nerve palsy.  SECONDARY DISCHARGE DIAGNOSES: 1. 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 2. History of anxiety disorder. 3. Chronic pain syndrome. 4. Bronchial asthma. 5. Gastroesophageal reflux disease.  DISCHARGE MEDICATIONS: 1. Aspirin 81 mg 1 tablet daily. 2. Combivent inhaler 2 puffs inhaled q.4 h. p.r.n. 3. Flexeril 10 mg 1 tablet p.o. twice daily. 4. Fentanyl patch 75 mcg an hour one patch transdermally q. 48 h. 5. Fish oil 1000 mg 1 capsule p.o. daily. 6. Flaxseed oil 1200 mg 1 capsule daily. 7. L-arginine 900 mg 1 tablet daily. 8. Wadie Lessen flower herbal 1 tablet daily. 9. Meclizine 25 mg 1 tablet p.o. three times a day p.r.n. 10.Milk thistle to 40 mg 2 tablets p.o. daily. 11.N-acetylcysteine 1 tablet p.o. daily. 12.Nexium 40 mg 1 capsule daily. 13.Oxycodone 20 mg 1 tablet p.o. four times a day p.r.n. pain. 14.Bactrim DS 1 tablet p.o. twice daily.  The patient started taking     this on 5/16 and apparently has a 2-week supply.  He is to continue     this as previously directed. 15.Vitamin B complex 1 tablet p.o. daily. 16.Vitamin D3 2 tablets p.o. daily. 17.Coumadin 5 mg, 2.5 tablets p.o. daily. 18.Xanax 1 mg 1 tablet p.o. three times a day p.r.n.  CONSULTATION:  Kofi A. Doonquah, MD  BRIEF HISTORY OF PRESENT ILLNESS:  The patient is a 50 year old male with the above noted chronic medical issues  presented to the ED with 5 to 6 day history of left numbness and weakness.  Initially in the ED, it was felt that he could have had a CVA and as a result he was admitted to the hospitalist service for further evaluation and treatment.  For further details, please see the history and physical that was dictated by Dr. Onalee Hua on admission.  PERTINENT RADIOLOGICAL DATA: 1. CT of the head without contrast was negative for bleed or any other     acute intracranial process. 2. MRI of the brain showed 2 to 3 small right hemispheric white matter     lesions are seen which are nonspecific. 3. Carotid duplex ultrasound did not show any evidence of clot     formation or stenosis.  PERTINENT LABORATORY DATA: 1. Cardiac enzymes cycled and these were negative. 2. INR on discharge is 2.10.  BRIEF HOSPITAL COURSE:  Left arm and hand weakness.  This apparently started 6 to 7 days ago and is waxing and weaning, but over the past few days, he has had more movement and more improvement in his symptoms. Initially, it was thought  that he could have had a CVA and as a result, a CT of the head was done which was negative.  This was then followed by an MRI of his brain which was negative as well.  Neurology was then consulted and they felt that his symptomatology is more consistent with a radial nerve palsy.  I have spoken to Dr. Gerilyn Pilgrim personally on the floor and he suggested further workup to be done as an outpatient and from his point, the patient can be discharged home.  The patient will follow up with Dr. Gerilyn Pilgrim in around 1 to 2 weeks' time for further workup including nerve conduction studies.  Please also note that the patient gets intermittent on and off dizziness and he will also get a EEG done as an outpatient when he follows up with Dr. Gerilyn Pilgrim.  Since this was not a CVA and it is very unlikely that this was a TIA as his clinical symptomatology is most suggestive of left radial nerve  palsy, further stroke workup at this time is not being done.  Rest of his chronic medical issues were stable.  DISPOSITION:  The patient will be discharged home.  FOLLOWUP INSTRUCTIONS: 1. The patient will follow up with Dr. Gerilyn Pilgrim in the next 1 to 2     weeks. 2. The patient is to follow up with his primary care practitioner in     [place] in the next 1 to 2 days for an INR     checkup. 3. Outpatient occupational therapy services will be arranged for the     patient on discharge as well.     Jeoffrey Massed, MD     SG/MEDQ  D:  09/05/2010  T:  09/05/2010  Job:  [id number]  cc:   Jeanella Cara, FNP [facility]  Kofi A. Gerilyn Pilgrim, M.D. Fax: [phone]  Electronically Signed by Jeoffrey Massed  on 09/09/2010 05:42:08 PM

## 2010-09-11 ENCOUNTER — Ambulatory Visit (HOSPITAL_COMMUNITY): Payer: Medicare (Managed Care) | Admitting: Specialist

## 2010-09-13 NOTE — H&P (Signed)
[name], [name]                 ACCOUNT NO.:  [id number]  MEDICAL RECORD NO.:  [id number]           PATIENT TYPE:  I  LOCATION:  A336                          FACILITY:  [facility]  PHYSICIAN:  Tarry Kos, MD       DATE OF BIRTH:  1960-12-19  DATE OF ADMISSION:  09/04/2010 DATE OF DISCHARGE:  LH                             HISTORY & PHYSICAL   CHIEF COMPLAINT:  Left hand numbness.  HISTORY OF PRESENT ILLNESS:  Mr. [name] is a 50 year old male with a history of protein S deficiency, pulmonary emboli about 7 months ago, tobacco abuse who comes to the ED today because of some left hand numbness that he had last week.  His primary care physician instructed him to come to the ED for concerns of stroke.  He says that his whole entire hand is numb on both sides, off life fingers from the wrist beyond.  He is describing some shooting sharp pain into his thumb.  He does not have any history of carpal tunnel syndrome.  He also says that the whole hand is also somewhat weak.  He does not have any history of stroke in the past or any arrhythmias; however, again he does have clotting disorder.  His wife is with him along with his daughter.  He has not had any other focal neurological deficits.  He has not had any problems speaking, any problems walking.  He has not had any issues with his lower extremities.  He says this started last week in his hand and it improved and it kinds of gets worse again and then improves, it waxes and wanes.  Right now he says it is not as bad as it was last week. Denies running any fevers.  Denies any chest pain or any breathing issues.  PAST MEDICAL HISTORY: 1. Protein S deficiency with a history of pulmonary emboli about 7     months ago, on chronic anticoagulation for life. 2. Anxiety. 3. History of asthma. 4. Chronic pain syndrome. 5. Hypertension. 6. GERD. 7. Status post T and A. 8. He is status post right hand surgery. 9. He is status post lap  chole. 10.History of degenerative disk disease in his lumbar sacral spine.  ALLERGIES:  None.  HOME MEDICATIONS: 1. Nexium. 2. Multivitamin. 3. Fentanyl patch. 4. OxyContin. 5. Xanax. 6. Flexeril. 7. OxyContin 30 mg 2 times a day. 8. Xanax 1 mg 3 times a day. 9. Flexeril 10 mg 2 times a day. 10.Fentanyl 75 mcg patch every 72 hours. 11.Nexium 40 mg a day. 12.Multivitamin a day.  SOCIAL HISTORY:  He does smoke about a pack a day.  He is on disability. He denies alcohol.  No IV drug abuse.  He is married.  He has 2 children, one of his daughter is with him right now who is [redacted] weeks pregnant.  I have also advised her to speak to her Ob/Gyn about getting tested for protein S deficiency and the rest of his children.  REVIEW OF SYSTEMS:  Otherwise negative.  PHYSICAL EXAMINATION:  VITALS:  Temperature 98, blood pressure 128/73, pulse 71,  respirations 20, 98% O2 sats on room air. GENERAL:  He is alert and oriented x4.  No apparent distress. Cooperative and friendly. HEENT:  Extraocular muscles intact.  Pupils equal, reactive to light. Oropharynx clear.  Mucous membranes moist. NECK:  No JVD.  No carotid bruits. CARDIOVASCULAR:  Regular rate rhythm without murmurs, rubs, or gallops. CHEST:  Clear to auscultation bilaterally.  No wheezes or rales. ABDOMEN:  Soft, nontender, nondistended.  Positive bowel sounds.  No hepatosplenomegaly. EXTREMITIES:  No clubbing, cyanosis, or edema. PSYCH:  Normal affect. NEURO:  Cranial nerves II through XII are grossly intact.  He has got 4/5 strength in his left upper extremity, 5/5 strength in his right upper extremity.  Lower extremities 5/5 strength bilaterally and equal. Reflexes are intact. PSYCH:  Normal affect. SKIN:  No rashes.  He has already had a carotid duplex ultrasound, which was normal.  He had MRI of his brain also, which did not show any acute issues.  CT of his head was also negative.  His white count is normal.  Hemoglobin  is 13.  Electrolytes are normal.  BUN and creatinine normal.  Cardiac enzymes negative.  LFTs normal.  INR is 2.08.  12-lead EKG normal sinus rhythm without any acute ST-T wave changes.  ASSESSMENT/PLAN:  This is a 50 year old male with a history of protein S deficiency who presents with left hand numbness concerning mini stroke. 1. Transient ischemic attack.  It does not appear that he has had     acute stroke.  Going to place him on full-dose aspirin.  He is     already anticoagulated with Coumadin.  We will continue to monitor     on telemetry to make sure he does not have any underlying of     arrhythmia.  We will obtain neurological checks q.4 h. and obtain     physical therapy evaluation. 2. Protein S deficiency with a history of pulmonary emboli in the     past.  We will continue his Coumadin     treatment and also check cardiac enzymes. 3. Tobacco abuse.  He has been encouraged to stop smoking. 4. The patient is full code.  Further recommendations pending hospital     course.                                           ______________________________ Tarry Kos, MD     RD/MEDQ  D:  09/04/2010  T:  09/05/2010  Job:  [id number]  Electronically Signed by Tarry Kos MD on 09/13/2010 11:47:14 AM

## 2010-09-20 NOTE — Consult Note (Signed)
NAMEKOLBY, [name]                 ACCOUNT NO.:  [id number]  MEDICAL RECORD NO.:  [id number]           PATIENT TYPE:  I  LOCATION:  A336                          FACILITY:  [facility]  PHYSICIAN:  Jobin Montelongo A. Gerilyn Pilgrim, M.D. DATE OF BIRTH:  Jan 17, 1961  DATE OF CONSULTATION: DATE OF DISCHARGE:                                CONSULTATION   REASON FOR CONSULTATION:  Numbness left upper extremity.  HISTORY OF PRESENT ILLNESS:  The patient is a 50 year old white male who has a history of protein S deficiency and apparently is on chronic warfarin therapy.  Had a PE several months ago.  The patient reports that he has been having spells of acute vertiginous symptoms consisting mostly of a spinning like sensation in the room, it will awaken him out of sleep at nighttime.  The inpatient slurs that whenever he has these spells in which he had several days INR is typically up like in the 7 or 8 range.  They are associated with burning hot-like sensation in the back of his head.  The patient developed left arm numbness while sleeping and thought his INR was again up, although this spell was a little different; however, the INR apparently was not increased.  He decided to seek medical attention as the left arm numbness has not improved.  The patient has had MRI and head CT scan, which is essentially unremarkable for anything acute, hence I was consulted.  PAST MEDICAL HISTORY: 1. Protein S deficiency on chronic warfarin therapy. 2. Anxiety disorder. 3. Asthma. 4. Chronic pain syndrome. 5. Hypertension. 6. GERD.  SURGICAL HISTORY:  Status post tonsillectomy and adenoidectomy, status post laparoscopic cholecystectomy, he has had surgery involving the right arm.  ADMISSION MEDICATIONS:  Coumadin, Nexium, multivitamin, fentanyl, OxyContin, Xanax, Flexeril.  ALLERGIES:  None known.  SOCIAL HISTORY:  He is married.  He has 2 children, one apparently is present.  No IV drug use.  He smokes a pack of  cigarettes per day.  No significant alcohol use.  He is on disability.  REVIEW OF SYSTEMS:  As stated in HPI, otherwise negative.  The patient denies any fevers, does not have a lot of headaches other than the burning-type discomfort he has to the back to the head with these episodic spells, again the spells last few minutes up to 30 minutes and again he states that they happen when his INR is high.  PHYSICAL EXAMINATION:  GENERAL:  A pleasant thin man in no acute distress. VITAL SIGNS:  Temperature 97.9, heart rate 70, blood pressure 117/76, respirations 20. HEENT:  Head is normocephalic, atraumatic. NECK:  Supple ABDOMEN:  Soft. EXTREMITIES:  No significant edema. MENTAL STATUS:  He is awake and alert.  Speech, language, and cognition intact. CRANIAL NERVES:  Pupils are equal, round, reactive to light and accommodation.  Extraocular movements are intact.  Visual fields are full.  Facial muscle strength symmetric.  Tongue midline.  Uvula midline.  Shoulder shrug little diminished on the left. MOTOR:  Weakness essentially of distal innovated radial nurse on the left side.  Wrist extension is 3, supinator 3, triceps are actually  relatively well-preserved, deltoids are also good 5, triceps are about 4.  Brachioradialis also 4-.  Right side upper extremity shows normal tone, bulk, and strength.  Legs also shows normal tone, bulk, and strength.  Reflexes are brisk throughout.  Plantars are both downgoing. Sensation, he has impaired sensation in the radial distribution the dorsum of the thumb and the area between the thumb and the index finger on the left side.  Coordination shows no dysmetria, no tremor, no parkinsonism.  I did review the patient's MRI scan in person and he does have 2 tiny areas on left parietal area, otherwise no acute findings.  Head CT scan was negative.  Chemistries are fine.  Liver enzymes normal.  INR 2.  WBC 6, hemoglobin 13, platelet count  261.  ASSESSMENT: 1. Likely Saturday night palsy on the left.  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 2. Unusual spells of burning and vertiginous symptoms, unclear     etiology.  The patient believes is due to increased INR, which is     somewhat unusual.  RECOMMENDATIONS: 1. Nerve conduction study which can be done in the outpatient bed     setting. 2. Also suggest EEG to evaluate these recurrent spells of headache and     vertiginous symptoms. 3. Consider switching the warfarin to one of the neuro agents given     that he thinks it is also due to increased INR.  Thanks for the consultation.     Jery Hollern A. Gerilyn Pilgrim, M.D.     KAD/MEDQ  D:  09/05/2010  T:  09/05/2010  Job:  [id number]  Electronically Signed by Beryle Beams M.D. on 09/20/2010 04:52:59 PM

## 2011-01-14 LAB — CBC
HCT: 45.3 % (ref 39.0–52.0)
Hemoglobin: 13.3 g/dL (ref 13.0–17.0)
Hemoglobin: 15.4 g/dL (ref 13.0–17.0)
MCHC: 33.8 g/dL (ref 30.0–36.0)
MCHC: 33.9 g/dL (ref 30.0–36.0)
MCV: 94.5 fL (ref 78.0–100.0)
Platelets: 114 10*3/uL — ABNORMAL LOW (ref 150–400)
Platelets: 143 10*3/uL — ABNORMAL LOW (ref 150–400)
Platelets: 182 10*3/uL (ref 150–400)
RBC: 3.67 MIL/uL — ABNORMAL LOW (ref 4.22–5.81)
RBC: 3.81 MIL/uL — ABNORMAL LOW (ref 4.22–5.81)
RDW: 13 % (ref 11.5–15.5)
RDW: 13.1 % (ref 11.5–15.5)
RDW: 13.3 % (ref 11.5–15.5)
WBC: 17.5 10*3/uL — ABNORMAL HIGH (ref 4.0–10.5)
WBC: 9.8 10*3/uL (ref 4.0–10.5)

## 2011-01-14 LAB — COMPREHENSIVE METABOLIC PANEL
ALT: 15 U/L (ref 0–53)
Albumin: 2.7 g/dL — ABNORMAL LOW (ref 3.5–5.2)
Alkaline Phosphatase: 57 U/L (ref 39–117)
Potassium: 3.2 mEq/L — ABNORMAL LOW (ref 3.5–5.1)
Sodium: 140 mEq/L (ref 135–145)
Total Protein: 6.1 g/dL (ref 6.0–8.3)

## 2011-01-14 LAB — CARDIAC PANEL(CRET KIN+CKTOT+MB+TROPI)
CK, MB: 0.8 ng/mL (ref 0.3–4.0)
Relative Index: INVALID (ref 0.0–2.5)
Relative Index: INVALID (ref 0.0–2.5)
Total CK: 15 U/L (ref 7–232)
Total CK: 20 U/L (ref 7–232)
Troponin I: 0.02 ng/mL (ref 0.00–0.06)
Troponin I: 0.03 ng/mL (ref 0.00–0.06)

## 2011-01-14 LAB — CULTURE, RESPIRATORY W GRAM STAIN

## 2011-01-14 LAB — DIFFERENTIAL
Basophils Absolute: 0 10*3/uL (ref 0.0–0.1)
Basophils Absolute: 0 10*3/uL (ref 0.0–0.1)
Basophils Relative: 0 % (ref 0–1)
Basophils Relative: 0 % (ref 0–1)
Basophils Relative: 0 % (ref 0–1)
Eosinophils Absolute: 0 10*3/uL (ref 0.0–0.7)
Eosinophils Absolute: 0 10*3/uL (ref 0.0–0.7)
Eosinophils Relative: 0 % (ref 0–5)
Eosinophils Relative: 0 % (ref 0–5)
Lymphocytes Relative: 6 % — ABNORMAL LOW (ref 12–46)
Lymphs Abs: 1.1 10*3/uL (ref 0.7–4.0)
Monocytes Absolute: 0.2 10*3/uL (ref 0.1–1.0)
Monocytes Relative: 2 % — ABNORMAL LOW (ref 3–12)
Monocytes Relative: 5 % (ref 3–12)
Neutro Abs: 15.2 10*3/uL — ABNORMAL HIGH (ref 1.7–7.7)
Neutro Abs: 6.9 10*3/uL (ref 1.7–7.7)
Neutrophils Relative %: 77 % (ref 43–77)
Neutrophils Relative %: 85 % — ABNORMAL HIGH (ref 43–77)

## 2011-01-14 LAB — CULTURE, BLOOD (ROUTINE X 2)
Culture: NO GROWTH
Report Status: 10232009

## 2011-01-14 LAB — BLOOD GAS, ARTERIAL
Acid-base deficit: 5.8 mmol/L — ABNORMAL HIGH (ref 0.0–2.0)
Bicarbonate: 20.5 mEq/L (ref 20.0–24.0)
O2 Saturation: 91.1 %
Patient temperature: 37
TCO2: 15.7 mmol/L (ref 0–100)
TCO2: 18.4 mmol/L (ref 0–100)
pCO2 arterial: 35 mmHg (ref 35.0–45.0)
pH, Arterial: 7.386 (ref 7.350–7.450)
pO2, Arterial: 174 mmHg — ABNORMAL HIGH (ref 80.0–100.0)

## 2011-01-14 LAB — BASIC METABOLIC PANEL
BUN: 13 mg/dL (ref 6–23)
BUN: 14 mg/dL (ref 6–23)
BUN: 16 mg/dL (ref 6–23)
CO2: 21 mEq/L (ref 19–32)
CO2: 23 mEq/L (ref 19–32)
Calcium: 8.6 mg/dL (ref 8.4–10.5)
Calcium: 9.3 mg/dL (ref 8.4–10.5)
Chloride: 113 mEq/L — ABNORMAL HIGH (ref 96–112)
Creatinine, Ser: 0.6 mg/dL (ref 0.4–1.5)
Creatinine, Ser: 0.61 mg/dL (ref 0.4–1.5)
GFR calc Af Amer: >60 mL/min (ref >60)
GFR calc Af Amer: >60 mL/min (ref >60)
GFR calc non Af Amer: >60 mL/min (ref >60)
Glucose, Bld: 121 mg/dL — ABNORMAL HIGH (ref 70–99)
Glucose, Bld: 161 mg/dL — ABNORMAL HIGH (ref 70–99)
Potassium: 3.9 mEq/L (ref 3.5–5.1)
Sodium: 135 mEq/L (ref 135–145)

## 2011-01-14 LAB — AFB CULTURE WITH SMEAR (NOT AT ARMC): Acid Fast Smear: NONE SEEN

## 2011-01-14 LAB — HEMOGLOBIN A1C
Hgb A1c MFr Bld: 5.3 % (ref 4.6–6.1)
Mean Plasma Glucose: 105 mg/dL

## 2011-01-14 LAB — URINE CULTURE
Colony Count: NO GROWTH
Culture: NO GROWTH
Special Requests: NEGATIVE

## 2011-01-14 LAB — TSH: TSH: 0.083 u[IU]/mL — ABNORMAL LOW (ref 0.350–4.500)

## 2011-01-14 LAB — EXPECTORATED SPUTUM ASSESSMENT W GRAM STAIN, RFLX TO RESP C

## 2011-01-31 ENCOUNTER — Encounter: Payer: Self-pay | Admitting: Emergency Medicine

## 2011-01-31 ENCOUNTER — Emergency Department (HOSPITAL_COMMUNITY)
Admission: EM | Admit: 2011-01-31 | Discharge: 2011-01-31 | Disposition: A | Discharge status: Home / Self Care | Payer: Medicare (Managed Care) | Attending: Emergency Medicine | Admitting: Emergency Medicine

## 2011-01-31 DIAGNOSIS — M545 Low back pain, unspecified: Secondary | ICD-10-CM | POA: Insufficient documentation

## 2011-01-31 DIAGNOSIS — G8929 Other chronic pain: Secondary | ICD-10-CM | POA: Insufficient documentation

## 2011-01-31 DIAGNOSIS — Z86718 Personal history of other venous thrombosis and embolism: Secondary | ICD-10-CM | POA: Insufficient documentation

## 2011-01-31 DIAGNOSIS — Z7901 Long term (current) use of anticoagulants: Secondary | ICD-10-CM | POA: Insufficient documentation

## 2011-01-31 DIAGNOSIS — IMO0002 Reserved for concepts with insufficient information to code with codable children: Secondary | ICD-10-CM | POA: Insufficient documentation

## 2011-01-31 DIAGNOSIS — F17200 Nicotine dependence, unspecified, uncomplicated: Secondary | ICD-10-CM | POA: Insufficient documentation

## 2011-01-31 HISTORY — DX: Reserved for concepts with insufficient information to code with codable children: IMO0002

## 2011-01-31 HISTORY — DX: Pneumonia, unspecified organism: J18.9

## 2011-01-31 HISTORY — DX: Other pulmonary embolism without acute cor pulmonale: I26.99

## 2011-01-31 MED ORDER — HYDROMORPHONE HCL 1 MG/ML IJ SOLN
2.0000 mg | Freq: Once | INTRAMUSCULAR | Status: AC
  Administered 2011-01-31: 2 mg via INTRAMUSCULAR
  Filled 2011-01-31: qty 2

## 2011-01-31 MED ORDER — OXYCODONE-ACETAMINOPHEN 5-325 MG PO TABS: 1.0000 | ORAL_TABLET | ORAL | Status: AC | PRN

## 2011-01-31 NOTE — ED Notes (Signed)
Pt c/o bilateral leg and back pain. Pt has been unable to go to pain clinic in 2.5 months.

## 2011-01-31 NOTE — ED Notes (Signed)
Pt c/o severe lower back pain radiating to both legs for the last couple days. States that he had been on Oxycodone 30 mg daily and Fentanyl patches. States that he has been terminated from the pain clinic and is in severe pain. Pt alert and oriented x 3. Skin warm and dry. Color pink. Breath sounds equal bilaterally with bilateral expiratory wheezing. Pt states that he cannot get out of bed due to pain and cannot carry on his activities of daily living. States that he needs to be put in a home so he can get his medicine.
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
## 2011-02-04 NOTE — ED Provider Notes (Addendum)
Medical screening examination/treatment/procedure(s) were conducted as a shared visit with non-physician practitioner(s) and myself.  I personally evaluated the patient during the encounter   Alert HEENT:  Atraumatic Abd:  nondistended Skin: Warm and dry    Nelia Shi, MD 02/04/11 [id number]  Nelia Shi, MD 02/06/11 [phone]

## 2011-03-01 ENCOUNTER — Emergency Department (HOSPITAL_COMMUNITY)
Admission: EM | Admit: 2011-03-01 | Discharge: 2011-03-01 | Disposition: A | Discharge status: Home / Self Care | Payer: Medicare (Managed Care) | Attending: Emergency Medicine | Admitting: Emergency Medicine

## 2011-03-01 ENCOUNTER — Encounter (HOSPITAL_COMMUNITY): Payer: Self-pay | Admitting: *Deleted

## 2011-03-01 DIAGNOSIS — IMO0002 Reserved for concepts with insufficient information to code with codable children: Secondary | ICD-10-CM | POA: Insufficient documentation

## 2011-03-01 DIAGNOSIS — Z9889 Other specified postprocedural states: Secondary | ICD-10-CM | POA: Insufficient documentation

## 2011-03-01 DIAGNOSIS — R209 Unspecified disturbances of skin sensation: Secondary | ICD-10-CM | POA: Insufficient documentation

## 2011-03-01 DIAGNOSIS — M545 Low back pain, unspecified: Secondary | ICD-10-CM | POA: Insufficient documentation

## 2011-03-01 DIAGNOSIS — Z7901 Long term (current) use of anticoagulants: Secondary | ICD-10-CM | POA: Insufficient documentation

## 2011-03-01 DIAGNOSIS — M549 Dorsalgia, unspecified: Secondary | ICD-10-CM

## 2011-03-01 DIAGNOSIS — G8929 Other chronic pain: Secondary | ICD-10-CM | POA: Insufficient documentation

## 2011-03-01 DIAGNOSIS — M129 Arthropathy, unspecified: Secondary | ICD-10-CM | POA: Insufficient documentation

## 2011-03-01 DIAGNOSIS — Z86718 Personal history of other venous thrombosis and embolism: Secondary | ICD-10-CM | POA: Insufficient documentation

## 2011-03-01 DIAGNOSIS — F17200 Nicotine dependence, unspecified, uncomplicated: Secondary | ICD-10-CM | POA: Insufficient documentation

## 2011-03-01 HISTORY — DX: Unspecified osteoarthritis, unspecified site: M19.90

## 2011-03-01 MED ORDER — KETOROLAC TROMETHAMINE 60 MG/2ML IM SOLN
60.0000 mg | Freq: Once | INTRAMUSCULAR | Status: AC
  Administered 2011-03-01: 60 mg via INTRAMUSCULAR
  Filled 2011-03-01: qty 2

## 2011-03-01 MED ORDER — OXYCODONE-ACETAMINOPHEN 5-325 MG PO TABS: 2.0000 | ORAL_TABLET | ORAL | Status: AC | PRN

## 2011-03-01 MED ORDER — HYDROMORPHONE HCL PF 1 MG/ML IJ SOLN
1.0000 mg | Freq: Once | INTRAMUSCULAR | Status: AC
  Administered 2011-03-01: 1 mg via INTRAMUSCULAR
  Filled 2011-03-01: qty 1

## 2011-03-01 NOTE — ED Provider Notes (Signed)
History     CSN: [id number] Arrival date & time: 03/01/2011  5:59 AM   First MD Initiated Contact with Patient 03/01/11 0601      Chief Complaint  Patient presents with  . Back Pain    (Consider location/radiation/quality/duration/timing/severity/associated sxs/prior treatment) HPI Comments: Eric SHOULTZ is a 50 y.o. male who presents to the Emergency Department complaining of back and leg pain. Patient with long standing chronic back pain due to disc disease and arthritis who has been on oxycodone 30 mg daily and fentanyl patch for pain management. In the last two weeks he has been dismissed by his PCP, Dr. Jeanella Cara at [facility] in [place]. He has been seen in the ER here for pain and given a Rx for percocet which he has finished. He continues to have back pain. He was referred to Dr. Gerilyn Pilgrim by the ER and his doctor in [place] for pain management however Dr. Gerilyn Pilgrim does not take patients from [place].Nothing the patient has done makes the pain better. Movement and walking seems to make the pain worse. He is currently scheduled to be seen by a new PCP in two weeks.   Patient is a 50 y.o. male presenting with back pain. The history is provided by the patient and the spouse.  Back Pain  This is a chronic problem. Episode onset: years. The problem occurs constantly. The problem has been gradually worsening. The pain is associated with no known injury. The pain is present in the lumbar spine. The quality of the pain is described as aching and shooting. Radiates to: radiates down both legs. The pain is severe. The symptoms are aggravated by bending, twisting and certain positions. The pain is the same all the time. Stiffness is present all day. Associated symptoms include paresthesias. Pertinent negatives include no bowel incontinence, no perianal numbness, no bladder incontinence and no weakness. He has tried analgesics for the symptoms.    Past Medical  History  Diagnosis Date  . PE (pulmonary embolism)   . Pneumonia   . Degenerative disk disease   . Arthritis     Past Surgical History  Procedure Date  . Cholecystectomy     Family History  Problem Relation Age of Onset  . Coronary artery disease Mother   . Coronary artery disease Father     History  Substance Use Topics  . Smoking status: Current Everyday Smoker    Types: Cigarettes  . Smokeless tobacco: Not on file  . Alcohol Use: No      Review of Systems  Gastrointestinal: Negative for bowel incontinence.  Genitourinary: Negative for bladder incontinence.  Musculoskeletal: Positive for back pain.  Neurological: Positive for paresthesias. Negative for weakness.  All other systems reviewed and are negative.    Allergies  Review of patient's allergies indicates no known allergies.  Home Medications   Current Outpatient Rx  Name Route Sig Dispense Refill  . ALBUTEROL SULFATE HFA 108 (90 BASE) MCG/ACT IN AERS Inhalation Inhale 2 puffs into the lungs every 4 (four) hours as needed. FOR SHORTNESS OF BREATH      . VITAMIN D 2000 UNITS PO TABS Oral Take 2,000 Units by mouth daily.      . CYCLOBENZAPRINE HCL 10 MG PO TABS Oral Take 10 mg by mouth 3 (three) times daily as needed. FOR BACK PAIN     . FENTANYL 75 MCG/HR TD PT72 Transdermal Place 1 patch onto the skin every other day. PATCH WAS REMOVED ON 01-30-11     .  OMEGA-3 FATTY ACIDS 1000 MG PO CAPS Oral Take 1 g by mouth daily.      Marland Kitchen HYDROCODONE-ACETAMINOPHEN 10-500 MG PO TABS Oral Take 1 tablet by mouth every 6 (six) hours as needed. FOR PAIN      . MECLIZINE HCL 25 MG PO TABS Oral Take 25 mg by mouth 3 (three) times daily as needed. FOR NAUSEA     . MILK THISTLE EXTRACT PO Oral Take 3,000 mg by mouth daily. 1000 MG CAPSULES TAKE 3 ONCE DAILY     . OXYCODONE HCL 30 MG PO TABS Oral Take 30 mg by mouth 3 (three) times daily.      Marland Kitchen PROMETHAZINE HCL 25 MG PO TABS Oral Take 25 mg by mouth every 6 (six) hours as  needed. FOR NAUSEA     . WARFARIN SODIUM 10 MG PO TABS Oral Take 10 mg by mouth daily. TAKES WITH 5 MG TABLET. FOR DOSE OF 15 MG FOR 2 DAYS AND 12.5 MG (ONE AND A HALF TABLET) FOR 2 DAYS. LAST DOSE WAS 15 MG ON 01-30-11     . WARFARIN SODIUM 5 MG PO TABS Oral Take 5 mg by mouth daily. TAKES WITH A 10 MG TABLET.  DOSAGE IS 15 MG FOR 2 DAYS AND 12.5 MG ( ONE AND A HALF TABLET ) FOR 2 DAYS. LAST DOSE 15 MG  ON 01-30-11     . ALPRAZOLAM 1 MG PO TABS Oral Take 1 mg by mouth 3 (three) times daily.      Marland Kitchen FLAX PO OIL Oral Take 1,200 mg by mouth daily. 1 CAPSULE ONCE DAILY    . VITAMIN E 400 UNITS PO CAPS Oral Take 400 Units by mouth daily.        BP 120/69  Pulse 87  Temp(Src) 98.2 F (36.8 C) (Oral)  Resp 18  Wt 221 lb (100.245 kg)  SpO2 95%  Physical Exam  Nursing note and vitals reviewed. Constitutional: He is oriented to person, place, and time. He appears well-developed and well-nourished. No distress.  HENT:  Head: Normocephalic and atraumatic.  Neck: Normal range of motion.  Cardiovascular: Normal rate, normal heart sounds and intact distal pulses.   Pulmonary/Chest: Effort normal and breath sounds normal.  Musculoskeletal: Normal range of motion.       Right hip: He exhibits normal range of motion, normal strength, no tenderness, no bony tenderness, no swelling and no crepitus.       Left hip: He exhibits normal range of motion, normal strength, no tenderness, no bony tenderness, no swelling and no crepitus.       Right knee: He exhibits normal range of motion, no swelling, no effusion, no deformity and no erythema.       Left knee: He exhibits normal range of motion, no swelling, no effusion and no deformity. no tenderness found.       Lumbar back: He exhibits tenderness.       Tenderness to lumbar paraspinal muscles bilaterally. No spinal tenderness to percussion. Negative straight leg raise bilaterally.  Neurological: He is alert and oriented to person, place, and time. He has  normal reflexes.  Skin: Skin is warm and dry.    ED Course  Procedures (including critical care time)     MDM  Patient with chronic back pain here with continued back pain. Spoke with him about management of back pain. Will provide him with a referral to the pain management doctor recently established in [place], Dr. Peggye Pitt.Pt stable in  ED with no significant deterioration in condition.The patient appears reasonably screened and/or stabilized for discharge and I doubt any other medical condition or other [facility] requiring further screening, evaluation, or treatment in the ED at this time prior to discharge.  MDM Reviewed: previous chart, nursing note and vitals           Nicoletta Dress. Colon Branch, MD 03/01/11 [phone]

## 2011-03-01 NOTE — ED Notes (Signed)
Says he could not afford to go to pain clinic and was trying to do without pain meds.

## 2011-03-01 NOTE — ED Notes (Signed)
Low back, chronic , bil leg pain,  For 3 days

## 2011-03-01 NOTE — ED Notes (Signed)
Chronic back pain, out of his fentanyl patches and percocet.  Says he has appt with MD but cannot wait for pain meds.  Alert, cooperative.

## 2011-06-21 IMAGING — CT CT HEAD W/O CM
1 series · 16 of 30 positions shown, 20 images · non-contrast
Comparison: None.

CLINICAL DATA: Dizziness, left-sided weakness and numbness

CT HEAD WITHOUT CONTRAST
TECHNIQUE: Contiguous axial images were obtained from the base of
the skull through the vertex without contrast.

[Series 2: headseq 4.8 h37s · axial · 0.43mm/px · z∈[+131,+288]mm · 16 of 36 slices shown, 20 images]
[im 2/36  brain]
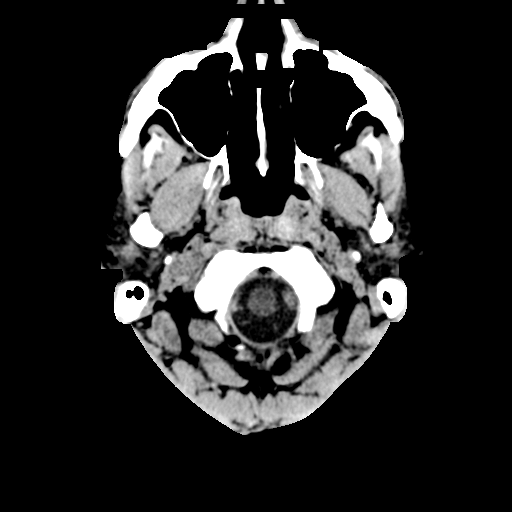
[im 2/36  bone]
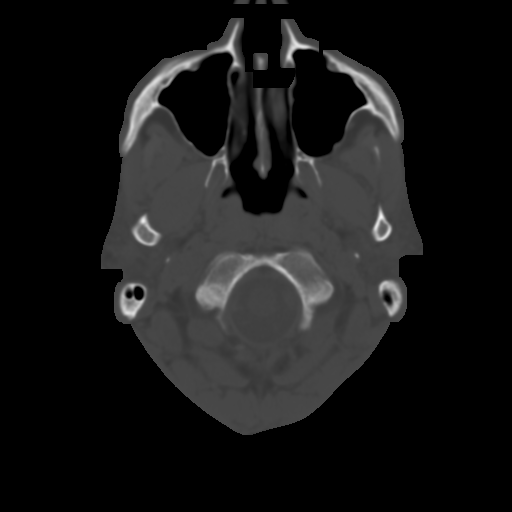
[im 4/36  brain]
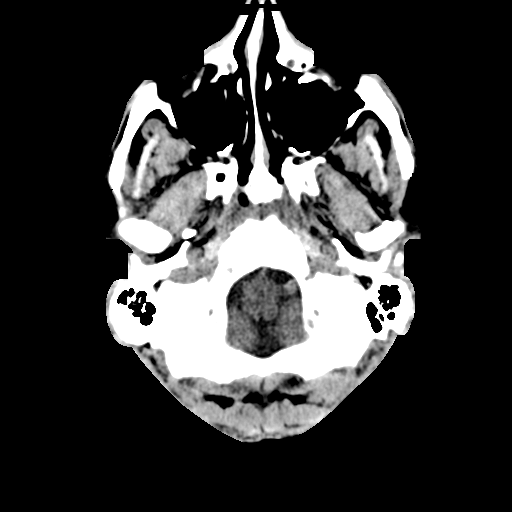
[im 7/36  brain]
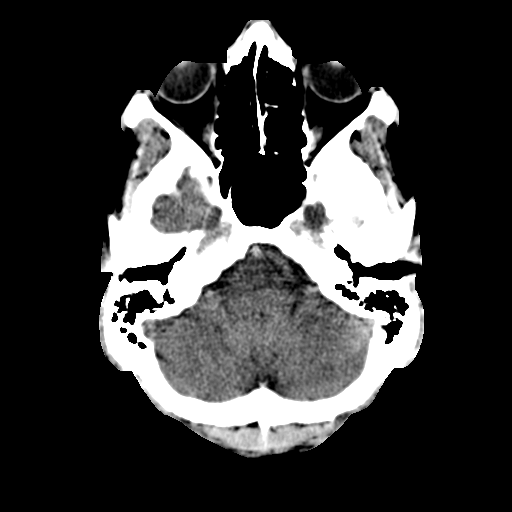
[im 9/36  brain]
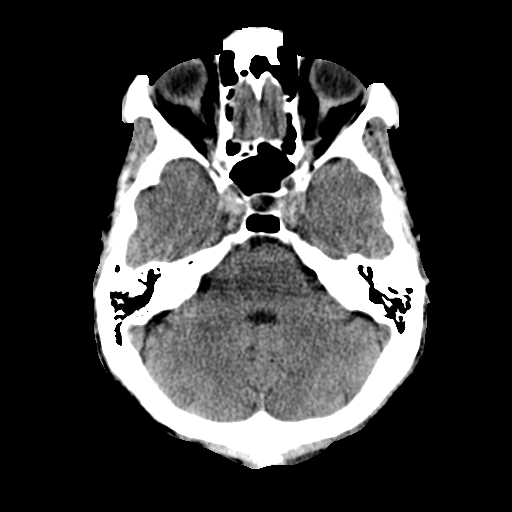
[im 10/36  brain]
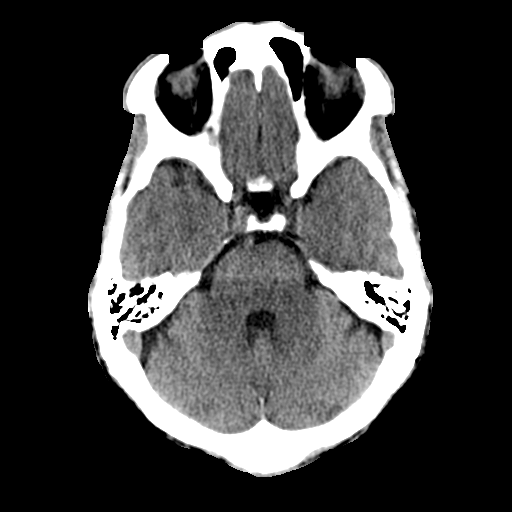
[im 10/36  bone]
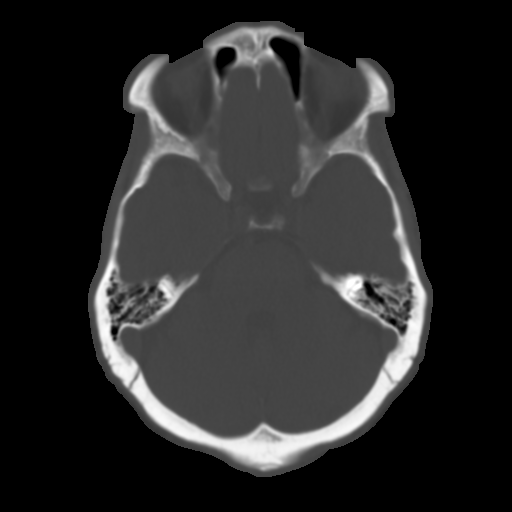
[im 13/36  brain]
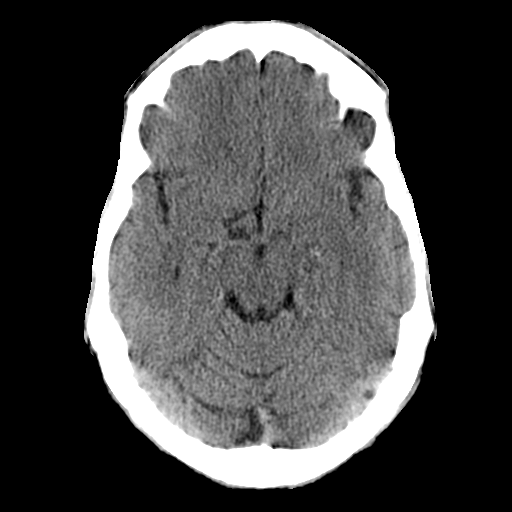
[im 15/36  brain]
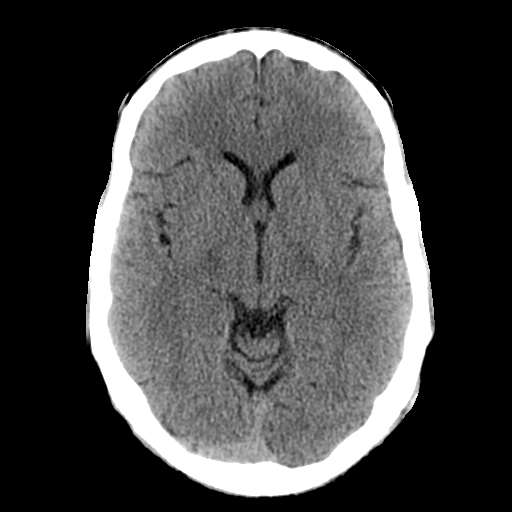
[im 17/36  brain]
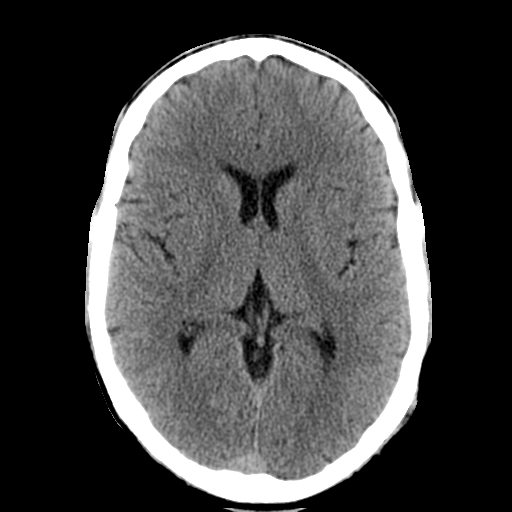
[im 19/36  brain]
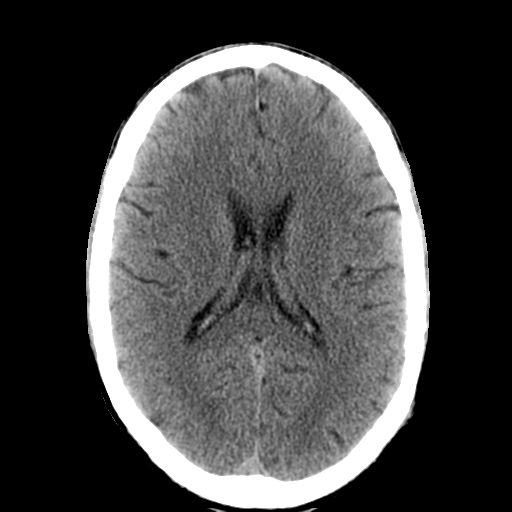
[im 19/36  bone]
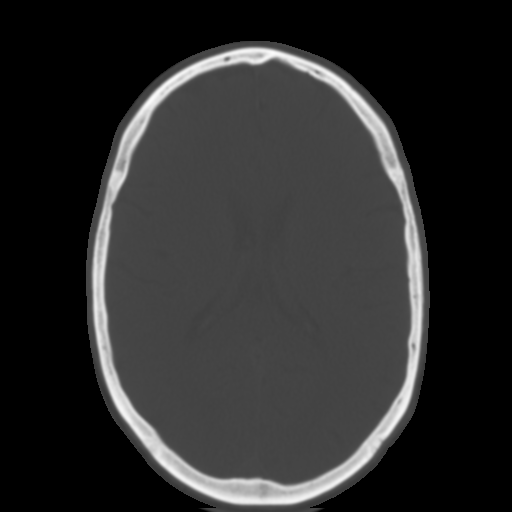
[im 21/36  brain]
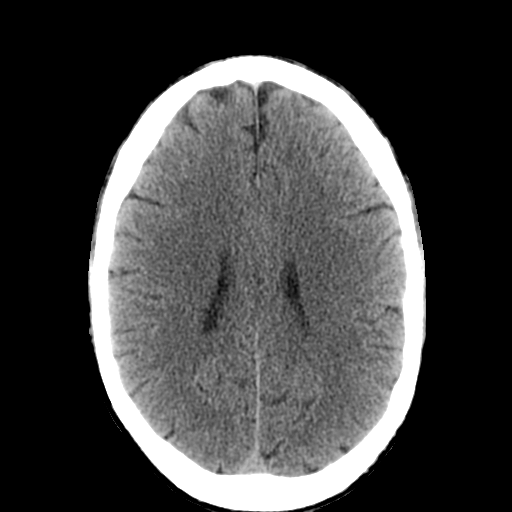
[im 23/36  brain]
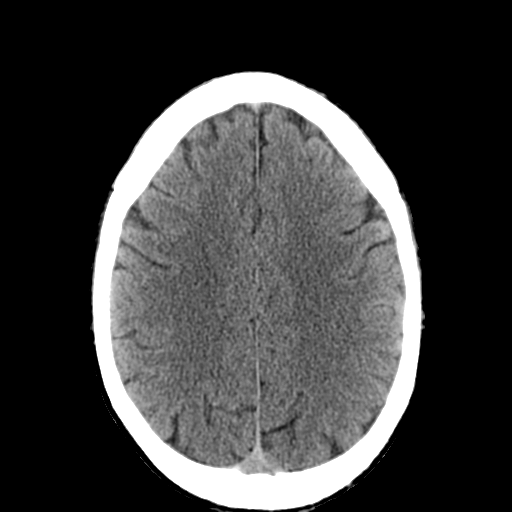
[im 26/36  brain]
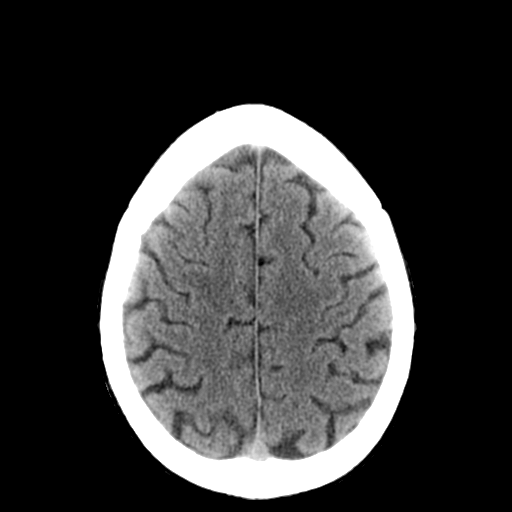
[im 27/36  brain]
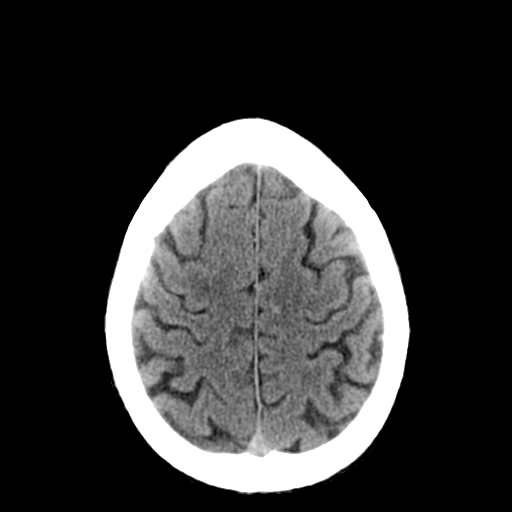
[im 27/36  bone]
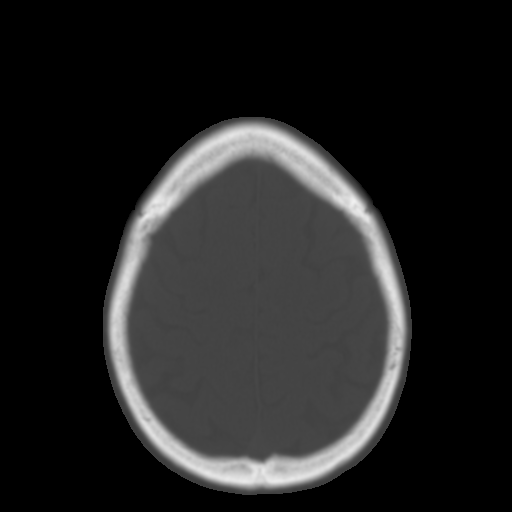
[im 29/36  brain]
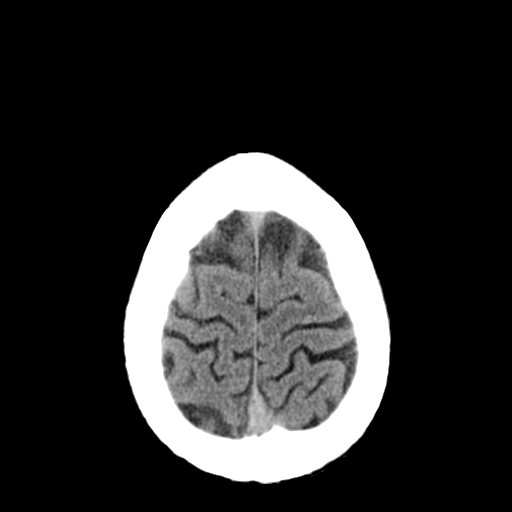
[im 32/36  brain]
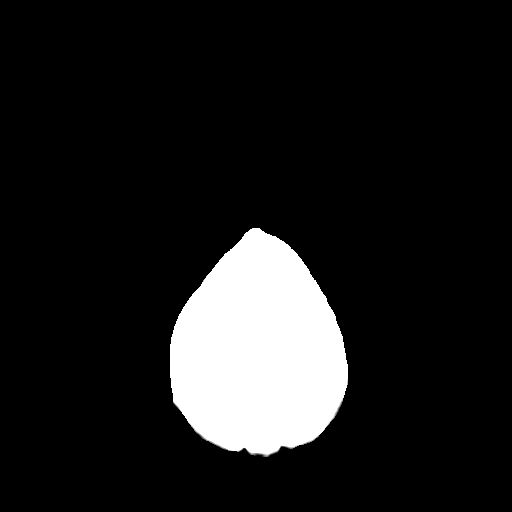
[im 34/36  brain]
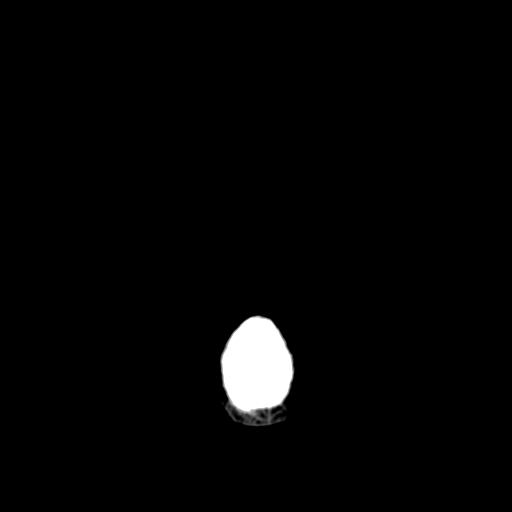

[16 of 30 positions shown; findings below may reference images not displayed]

FINDINGS: There is no evidence of acute intracranial hemorrhage,
brain edema, mass lesion, acute infarction,   mass effect, or
midline shift. Acute infarct may be inapparent on noncontrast CT.
No other intra-axial abnormalities are seen, and the ventricles and
sulci are within normal limits in size and symmetry.   No abnormal
extra-axial fluid collections or masses are identified.  No
significant calvarial abnormality.
IMPRESSION: 1. Negative for bleed or other acute intracranial process.

## 2011-10-18 ENCOUNTER — Encounter (HOSPITAL_COMMUNITY): Payer: Self-pay | Admitting: Emergency Medicine

## 2011-10-18 ENCOUNTER — Emergency Department (HOSPITAL_COMMUNITY)
Admission: EM | Admit: 2011-10-18 | Discharge: 2011-10-18 | Disposition: A | Discharge status: Home / Self Care | Payer: PRIVATE HEALTH INSURANCE | Attending: Emergency Medicine | Admitting: Emergency Medicine

## 2011-10-18 DIAGNOSIS — Z86711 Personal history of pulmonary embolism: Secondary | ICD-10-CM | POA: Insufficient documentation

## 2011-10-18 DIAGNOSIS — S02609A Fracture of mandible, unspecified, initial encounter for closed fracture: Secondary | ICD-10-CM | POA: Insufficient documentation

## 2011-10-18 DIAGNOSIS — M129 Arthropathy, unspecified: Secondary | ICD-10-CM | POA: Insufficient documentation

## 2011-10-18 DIAGNOSIS — IMO0002 Reserved for concepts with insufficient information to code with codable children: Secondary | ICD-10-CM | POA: Insufficient documentation

## 2011-10-18 DIAGNOSIS — Y9229 Other specified public building as the place of occurrence of the external cause: Secondary | ICD-10-CM | POA: Insufficient documentation

## 2011-10-18 DIAGNOSIS — F17200 Nicotine dependence, unspecified, uncomplicated: Secondary | ICD-10-CM | POA: Insufficient documentation

## 2011-10-18 MED ORDER — OXYCODONE-ACETAMINOPHEN 5-325 MG PO TABS
2.0000 | ORAL_TABLET | Freq: Once | ORAL | Status: AC
  Administered 2011-10-18: 2 via ORAL
  Filled 2011-10-18: qty 2

## 2011-10-18 MED ORDER — FLUORESCEIN SODIUM 1 MG OP STRP
1.0000 | ORAL_STRIP | Freq: Once | OPHTHALMIC | Status: AC
  Administered 2011-10-18: 10:00:00 via OPHTHALMIC
  Filled 2011-10-18: qty 1

## 2011-10-18 MED ORDER — PENICILLIN V POTASSIUM 500 MG PO TABS: 500.0000 mg | ORAL_TABLET | Freq: Three times a day (TID) | ORAL | Status: AC

## 2011-10-18 MED ORDER — OXYCODONE-ACETAMINOPHEN 5-325 MG PO TABS: 1.0000 | ORAL_TABLET | ORAL | Status: DC | PRN

## 2011-10-18 MED ORDER — TETRACAINE HCL 0.5 % OP SOLN
1.0000 [drp] | Freq: Once | OPHTHALMIC | Status: AC
  Administered 2011-10-18: 1 [drp] via OPHTHALMIC
  Filled 2011-10-18: qty 2
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
## 2011-10-20 ENCOUNTER — Encounter (HOSPITAL_COMMUNITY): Payer: Self-pay | Admitting: *Deleted

## 2011-10-20 NOTE — ED Notes (Signed)
Chart was opened due to pt calling asking for a refill for pain medication.

## 2011-10-21 ENCOUNTER — Encounter (HOSPITAL_COMMUNITY): Payer: Self-pay | Admitting: Critical Care Medicine

## 2011-10-21 ENCOUNTER — Encounter (HOSPITAL_COMMUNITY)
Admission: RE | Disposition: A | Discharge status: Home / Self Care | Payer: Self-pay | Source: Ambulatory Visit | Attending: Otolaryngology

## 2011-10-21 ENCOUNTER — Ambulatory Visit (HOSPITAL_COMMUNITY)
Admission: RE | Admit: 2011-10-21 | Discharge: 2011-10-21 | Disposition: A | Discharge status: Home / Self Care | Payer: PRIVATE HEALTH INSURANCE | Source: Ambulatory Visit | Attending: Otolaryngology | Admitting: Otolaryngology

## 2011-10-21 ENCOUNTER — Encounter (HOSPITAL_COMMUNITY): Payer: Self-pay | Admitting: *Deleted

## 2011-10-21 ENCOUNTER — Other Ambulatory Visit: Payer: Self-pay | Admitting: Otolaryngology

## 2011-10-21 ENCOUNTER — Ambulatory Visit (HOSPITAL_COMMUNITY): Payer: PRIVATE HEALTH INSURANCE | Admitting: Critical Care Medicine

## 2011-10-21 DIAGNOSIS — J45909 Unspecified asthma, uncomplicated: Secondary | ICD-10-CM | POA: Insufficient documentation

## 2011-10-21 DIAGNOSIS — S02609A Fracture of mandible, unspecified, initial encounter for closed fracture: Secondary | ICD-10-CM

## 2011-10-21 DIAGNOSIS — K219 Gastro-esophageal reflux disease without esophagitis: Secondary | ICD-10-CM | POA: Insufficient documentation

## 2011-10-21 DIAGNOSIS — Y9229 Other specified public building as the place of occurrence of the external cause: Secondary | ICD-10-CM | POA: Insufficient documentation

## 2011-10-21 DIAGNOSIS — S02620A Fracture of subcondylar process of mandible, unspecified side, initial encounter for closed fracture: Secondary | ICD-10-CM | POA: Insufficient documentation

## 2011-10-21 HISTORY — DX: Unspecified asthma, uncomplicated: J45.909

## 2011-10-21 HISTORY — DX: Gastro-esophageal reflux disease without esophagitis: K21.9

## 2011-10-21 HISTORY — DX: Anxiety disorder, unspecified: F41.9

## 2011-10-21 HISTORY — PX: ORIF MANDIBULAR FRACTURE: SHX2127

## 2011-10-21 HISTORY — PX: CLOSED REDUCTION MANDIBLE WITH MANDIBULOMA: SHX5313

## 2011-10-21 LAB — SURGICAL PCR SCREEN
MRSA, PCR: NEGATIVE
Staphylococcus aureus: NEGATIVE

## 2011-10-21 LAB — CBC
HCT: 37.4 % — ABNORMAL LOW (ref 39.0–52.0)
Hemoglobin: 12.5 g/dL — ABNORMAL LOW (ref 13.0–17.0)
MCH: 30.1 pg (ref 26.0–34.0)
MCHC: 33.4 g/dL (ref 30.0–36.0)

## 2011-10-21 LAB — PROTIME-INR: INR: 2.16 — ABNORMAL HIGH (ref 0.00–1.49)

## 2011-10-21 LAB — COMPREHENSIVE METABOLIC PANEL
ALT: 22 U/L (ref 0–53)
AST: 26 U/L (ref 0–37)
Calcium: 8.7 mg/dL (ref 8.4–10.5)
Sodium: 140 mEq/L (ref 135–145)
Total Protein: 6.8 g/dL (ref 6.0–8.3)

## 2011-10-21 SURGERY — CLOSED REDUCTION, MANDIBLE, WITH ARCH BAR APPLICATION AND INTERMAXILLARY FIXATION
Anesthesia: General | Site: Mouth | Wound class: Clean

## 2011-10-21 MED ORDER — HYDROMORPHONE HCL PF 1 MG/ML IJ SOLN
0.2500 mg | INTRAMUSCULAR | Status: DC | PRN
  Administered 2011-10-21: 1 mg via INTRAVENOUS

## 2011-10-21 MED ORDER — HYDROMORPHONE HCL PF 1 MG/ML IJ SOLN
0.5000 mg | INTRAMUSCULAR | Status: DC | PRN
  Administered 2011-10-21 (×2): 1 mg via INTRAVENOUS

## 2011-10-21 MED ORDER — LACTATED RINGERS IV SOLN
INTRAVENOUS | Status: DC | PRN
  Administered 2011-10-21 (×2): via INTRAVENOUS

## 2011-10-21 MED ORDER — HYDROMORPHONE HCL PF 1 MG/ML IJ SOLN
INTRAMUSCULAR | Status: AC
  Filled 2011-10-21: qty 1

## 2011-10-21 MED ORDER — CHLORHEXIDINE GLUCONATE 0.12 % MT SOLN
15.0000 mL | Freq: Once | OROMUCOSAL | Status: DC
  Filled 2011-10-21: qty 15

## 2011-10-21 MED ORDER — LIDOCAINE HCL (CARDIAC) 20 MG/ML IV SOLN
INTRAVENOUS | Status: DC | PRN
  Administered 2011-10-21: 100 mg via INTRAVENOUS

## 2011-10-21 MED ORDER — LIDOCAINE-EPINEPHRINE 1 %-1:100000 IJ SOLN
INTRAMUSCULAR | Status: AC
  Filled 2011-10-21: qty 1

## 2011-10-21 MED ORDER — SUCCINYLCHOLINE CHLORIDE 20 MG/ML IJ SOLN
INTRAMUSCULAR | Status: DC | PRN
  Administered 2011-10-21: 100 mg via INTRAVENOUS

## 2011-10-21 MED ORDER — DEXAMETHASONE SODIUM PHOSPHATE 4 MG/ML IJ SOLN
INTRAMUSCULAR | Status: DC | PRN
  Administered 2011-10-21: 8 mg via INTRAVENOUS

## 2011-10-21 MED ORDER — BACITRACIN ZINC 500 UNIT/GM EX OINT
TOPICAL_OINTMENT | CUTANEOUS | Status: AC
  Filled 2011-10-21: qty 15

## 2011-10-21 MED ORDER — MUPIROCIN 2 % EX OINT
TOPICAL_OINTMENT | Freq: Two times a day (BID) | CUTANEOUS | Status: DC
  Administered 2011-10-21: 16:00:00 via NASAL
  Filled 2011-10-21: qty 22

## 2011-10-21 MED ORDER — OXYCODONE-ACETAMINOPHEN 5-325 MG/5ML PO SOLN: 5.0000 mL | ORAL | Status: DC | PRN

## 2011-10-21 MED ORDER — HYDROMORPHONE HCL PF 1 MG/ML IJ SOLN
INTRAMUSCULAR | Status: AC
  Administered 2011-10-21: 1 mg via INTRAVENOUS
  Filled 2011-10-21: qty 1

## 2011-10-21 MED ORDER — ONDANSETRON HCL 4 MG/2ML IJ SOLN
INTRAMUSCULAR | Status: DC | PRN
  Administered 2011-10-21: 4 mg via INTRAVENOUS

## 2011-10-21 MED ORDER — PROPOFOL 10 MG/ML IV EMUL
INTRAVENOUS | Status: DC | PRN
  Administered 2011-10-21: 200 mg via INTRAVENOUS

## 2011-10-21 MED ORDER — MIDAZOLAM HCL 5 MG/5ML IJ SOLN
INTRAMUSCULAR | Status: DC | PRN
  Administered 2011-10-21: 2 mg via INTRAVENOUS

## 2011-10-21 MED ORDER — LIDOCAINE-EPINEPHRINE 1 %-1:100000 IJ SOLN
INTRAMUSCULAR | Status: DC | PRN
  Administered 2011-10-21: 3 mL

## 2011-10-21 MED ORDER — LACTATED RINGERS IV SOLN
INTRAVENOUS | Status: DC
  Administered 2011-10-21: 17:00:00 via INTRAVENOUS

## 2011-10-21 MED ORDER — DROPERIDOL 2.5 MG/ML IJ SOLN: 0.6250 mg | INTRAMUSCULAR | Status: DC | PRN

## 2011-10-21 MED ORDER — FENTANYL CITRATE 0.05 MG/ML IJ SOLN
INTRAMUSCULAR | Status: DC | PRN
  Administered 2011-10-21 (×2): 50 ug via INTRAVENOUS
  Administered 2011-10-21: 150 ug via INTRAVENOUS
  Administered 2011-10-21: 50 ug via INTRAVENOUS
  Administered 2011-10-21 (×2): 100 ug via INTRAVENOUS

## 2011-10-21 MED ORDER — OXYMETAZOLINE HCL 0.05 % NA SOLN
NASAL | Status: AC
  Filled 2011-10-21: qty 15

## 2011-10-21 MED ORDER — MUPIROCIN 2 % EX OINT
TOPICAL_OINTMENT | CUTANEOUS | Status: AC
  Filled 2011-10-21: qty 22

## 2011-10-21 MED ORDER — CHLORHEXIDINE GLUCONATE 0.12 % MT SOLN: 15.0000 mL | Freq: Two times a day (BID) | OROMUCOSAL | Status: AC

## 2011-10-21 SURGICAL SUPPLY — 25 items
BAG DECANTER FOR FLEXI CONT (MISCELLANEOUS) IMPLANT
CANISTER SUCTION 2500CC (MISCELLANEOUS) ×2 IMPLANT
CLEANER TIP ELECTROSURG 2X2 (MISCELLANEOUS) ×2 IMPLANT
CLOTH BEACON ORANGE TIMEOUT ST (SAFETY) ×2 IMPLANT
ELECT COATED BLADE 2.86 ST (ELECTRODE) ×2 IMPLANT
ELECT NEEDLE BLADE 2-5/6 (NEEDLE) IMPLANT
ELECT REM PT RETURN 9FT ADLT (ELECTROSURGICAL) ×2
ELECTRODE REM PT RTRN 9FT ADLT (ELECTROSURGICAL) ×1 IMPLANT
GLOVE ECLIPSE 7.5 STRL STRAW (GLOVE) ×4 IMPLANT
GOWN STRL NON-REIN LRG LVL3 (GOWN DISPOSABLE) ×4 IMPLANT
KIT BASIN OR (CUSTOM PROCEDURE TRAY) ×2 IMPLANT
KIT ROOM TURNOVER OR (KITS) ×2 IMPLANT
NS IRRIG 1000ML POUR BTL (IV SOLUTION) ×2 IMPLANT
PAD ARMBOARD 7.5X6 YLW CONV (MISCELLANEOUS) ×4 IMPLANT
PENCIL BUTTON HOLSTER BLD 10FT (ELECTRODE) ×2 IMPLANT
SCISSORS WIRE ANG 4 3/4 DISP (INSTRUMENTS) ×2 IMPLANT
SCREW UPPER FACE 2.0X8MM (Screw) ×8 IMPLANT
SUT PLAIN 6 0 TG1408 (SUTURE) IMPLANT
SUT PROLENE 5 0 C1 (SUTURE) IMPLANT
SUT STEEL 2 (SUTURE) ×2 IMPLANT
SUT VIC AB 3-0 FS2 27 (SUTURE) IMPLANT
TOWEL OR 17X24 6PK STRL BLUE (TOWEL DISPOSABLE) ×2 IMPLANT
TOWEL OR 17X26 10 PK STRL BLUE (TOWEL DISPOSABLE) ×2 IMPLANT
TRAY ENT MC OR (CUSTOM PROCEDURE TRAY) ×2 IMPLANT
WATER STERILE IRR 1000ML POUR (IV SOLUTION) ×2 IMPLANT

## 2011-10-21 NOTE — Progress Notes (Signed)
Patient reports that pain is still 9/10

## 2011-10-21 NOTE — Anesthesia Postprocedure Evaluation (Signed)
Anesthesia Post Note  Patient: [name]  Procedure(s) Performed: Procedure(s) (LRB): CLOSED REDUCTION MANDIBLE WITH MANDIBULOMAXILLARY FUSION (N/A) OPEN REDUCTION INTERNAL FIXATION (ORIF) MANDIBULAR FRACTURE (N/A)  Anesthesia type: general  Patient location: PACU  Post pain: Pain level controlled  Post assessment: Patient's Cardiovascular Status Stable  Last Vitals:  Filed Vitals:   10/21/11 2215  BP: 119/70  Pulse: 80  Temp: 37.2 C  Resp: 17    Post vital signs: Reviewed and stable  Level of consciousness: sedated  Complications: No apparent anesthesia complications

## 2011-10-21 NOTE — Transfer of Care (Signed)
Immediate Anesthesia Transfer of Care Note  Patient: [name]  Procedure(s) Performed: Procedure(s) (LRB): CLOSED REDUCTION MANDIBLE WITH MANDIBULOMAXILLARY FUSION (N/A) OPEN REDUCTION INTERNAL FIXATION (ORIF) MANDIBULAR FRACTURE (N/A)  Patient Location: PACU  Anesthesia Type: General  Level of Consciousness: awake, alert  and oriented  Airway & Oxygen Therapy: Patient Spontanous Breathing  Post-op Assessment: Report given to PACU RN, Post -op Vital signs reviewed and stable and Patient moving all extremities X 4  Post vital signs: Reviewed and stable  Complications: No apparent anesthesia complications

## 2011-10-21 NOTE — Brief Op Note (Signed)
10/21/2011  8:43 PM  PATIENT:  [name]  51 y.o. male  PRE-OPERATIVE DIAGNOSIS: Bilateral mandible fracture  POST-OPERATIVE DIAGNOSIS: Bilateral mandible fracture  PROCEDURE:  Procedure(s) (LRB): CLOSED REDUCTION MANDIBLE FX WITH MANDIBULOMAXILLARY FIXATION (N/A)  SURGEON:  Surgeon(s) and Role:    * Darletta Moll, MD - Primary  PHYSICIAN ASSISTANT:   ASSISTANTS: none   ANESTHESIA:   general  EBL:     BLOOD ADMINISTERED:none  DRAINS: none   LOCAL MEDICATIONS USED:  LIDOCAINE  and Amount: 3 ml  SPECIMEN:  No Specimen  DISPOSITION OF SPECIMEN:  N/A  COUNTS:  YES  TOURNIQUET:  * No tourniquets in log *  DICTATION: .Other Dictation: Dictation Number [id number]  PLAN OF CARE: Discharge to home after PACU  PATIENT DISPOSITION:  PACU - hemodynamically stable.   Delay start of Pharmacological VTE agent (>24hrs) due to surgical blood loss or risk of bleeding: not applicable

## 2011-10-21 NOTE — Progress Notes (Signed)
Patient intermittently resting but alert and oriented. Patient reports that pain remains controlled at 7/10. Patient remains on continuous pulse ox 93% on 2 lpm oxygen via [place].

## 2011-10-21 NOTE — Anesthesia Preprocedure Evaluation (Addendum)
Anesthesia Evaluation  Patient identified by MRN, date of birth, ID band Patient awake    Reviewed: Allergy & Precautions, H&P , NPO status , Patient's Chart, lab work & pertinent test results  Airway   Neck ROM: limited    Dental  (+) Dental Advisory Given   Pulmonary asthma , PE         Cardiovascular Rhythm:regular Rate:Normal     Neuro/Psych  Headaches, PSYCHIATRIC DISORDERS Anxiety    GI/Hepatic GERD-  ,(+) Hepatitis -, B  Endo/Other    Renal/GU      Musculoskeletal  (+) Arthritis -,   Abdominal   Peds  Hematology  (+) Blood dyscrasia, ,   Anesthesia Other Findings Blood clotting disorder.  Takes blood thinners. Hx blood clots/ lungs  Reproductive/Obstetrics                         Anesthesia Physical Anesthesia Plan  ASA: III  Anesthesia Plan: General   Post-op Pain Management:    Induction: Intravenous  Airway Management Planned: Oral ETT and Nasal ETT  Additional Equipment:   Intra-op Plan:   Post-operative Plan: Extubation in OR  Informed Consent: I have reviewed the patients History and Physical, chart, labs and discussed the procedure including the risks, benefits and alternatives for the proposed anesthesia with the patient or authorized representative who has indicated his/her understanding and acceptance.   Dental advisory given  Plan Discussed with: CRNA, Anesthesiologist and Surgeon  Anesthesia Plan Comments:        Anesthesia Quick Evaluation
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
## 2011-10-21 NOTE — Progress Notes (Signed)
Report given to Theresa,CRNA. 

## 2011-10-21 NOTE — Preoperative (Signed)
Beta Blockers   Reason not to administer Beta Blockers:Not Applicable 

## 2011-10-21 NOTE — Anesthesia Procedure Notes (Signed)
Procedure Name: Intubation Date/Time: 10/21/2011 8:08 PM Performed by: Molli Hazard Pre-anesthesia Checklist: Patient identified, Emergency Drugs available, Suction available and Patient being monitored Patient Re-evaluated:Patient Re-evaluated prior to inductionOxygen Delivery Method: Circle system utilized Preoxygenation: Pre-oxygenation with 100% oxygen Intubation Type: IV induction Laryngoscope Size: Miller and 3 Grade View: Grade II Nasal Tubes: Nasal Rae Tube size: 7.5 mm Number of attempts: 2 Placement Confirmation: ETT inserted through vocal cords under direct vision,  positive ETCO2 and breath sounds checked- equal and bilateral Tube secured with: Tape Dental Injury: Bloody posterior oropharynx  Comments: First attempt w/ Miller 2. Epiglottis visualized. Mask ventilate. Second DL with Hyacinth Meeker 3. Blood noted in posterior oropharynx. Grade II view and easily intubated.

## 2011-10-21 NOTE — Progress Notes (Signed)
Per Dr. Katrinka Blazing when giving dilaudid give 1 mg wait 10 minutes assess pain. If needed give another 1 mg then follow dosing as ordered.

## 2011-10-21 NOTE — Progress Notes (Signed)
Patient reports 7/10

## 2011-10-22 ENCOUNTER — Encounter (HOSPITAL_COMMUNITY): Payer: Self-pay | Admitting: Otolaryngology
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
## 2011-10-25 ENCOUNTER — Encounter (HOSPITAL_COMMUNITY): Payer: Self-pay | Admitting: Emergency Medicine

## 2011-10-25 ENCOUNTER — Emergency Department (HOSPITAL_COMMUNITY)
Admission: EM | Admit: 2011-10-25 | Discharge: 2011-10-25 | Disposition: A | Discharge status: Home / Self Care | Payer: PRIVATE HEALTH INSURANCE | Attending: Emergency Medicine | Admitting: Emergency Medicine

## 2011-10-25 DIAGNOSIS — F411 Generalized anxiety disorder: Secondary | ICD-10-CM | POA: Insufficient documentation

## 2011-10-25 DIAGNOSIS — K219 Gastro-esophageal reflux disease without esophagitis: Secondary | ICD-10-CM | POA: Insufficient documentation

## 2011-10-25 DIAGNOSIS — F17200 Nicotine dependence, unspecified, uncomplicated: Secondary | ICD-10-CM | POA: Insufficient documentation

## 2011-10-25 DIAGNOSIS — IMO0001 Reserved for inherently not codable concepts without codable children: Secondary | ICD-10-CM | POA: Insufficient documentation

## 2011-10-25 DIAGNOSIS — J45909 Unspecified asthma, uncomplicated: Secondary | ICD-10-CM | POA: Insufficient documentation

## 2011-10-25 DIAGNOSIS — Z760 Encounter for issue of repeat prescription: Secondary | ICD-10-CM | POA: Insufficient documentation

## 2011-10-25 DIAGNOSIS — IMO0002 Reserved for concepts with insufficient information to code with codable children: Secondary | ICD-10-CM | POA: Insufficient documentation

## 2011-10-25 DIAGNOSIS — R6884 Jaw pain: Secondary | ICD-10-CM | POA: Insufficient documentation

## 2011-10-25 DIAGNOSIS — Z86711 Personal history of pulmonary embolism: Secondary | ICD-10-CM | POA: Insufficient documentation

## 2011-10-25 DIAGNOSIS — Z8619 Personal history of other infectious and parasitic diseases: Secondary | ICD-10-CM | POA: Insufficient documentation

## 2011-10-25 MED ORDER — OXYCODONE HCL 5 MG/5ML PO SOLN: 5.0000 mg | ORAL | Status: DC | PRN

## 2011-10-25 MED ORDER — ONDANSETRON 4 MG PO TBDP: 4.0000 mg | ORAL_TABLET | Freq: Three times a day (TID) | ORAL | Status: AC | PRN

## 2011-10-25 MED ORDER — HYDROMORPHONE HCL PF 1 MG/ML IJ SOLN
1.0000 mg | Freq: Once | INTRAMUSCULAR | Status: AC
  Administered 2011-10-25: 1 mg via INTRAMUSCULAR
  Filled 2011-10-25: qty 1

## 2011-10-25 MED ORDER — ONDANSETRON 4 MG PO TBDP
4.0000 mg | ORAL_TABLET | Freq: Once | ORAL | Status: AC
  Administered 2011-10-25: 4 mg via ORAL
  Filled 2011-10-25: qty 1
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
## 2011-10-30 NOTE — ED Provider Notes (Signed)
Medical screening examination/treatment/procedure(s) were performed by non-physician practitioner and as supervising physician I was immediately available for consultation/collaboration.   Shelda Jakes, MD 10/30/11 [phone]

## 2011-11-10 ENCOUNTER — Encounter (HOSPITAL_COMMUNITY): Payer: Self-pay

## 2011-11-10 ENCOUNTER — Emergency Department (HOSPITAL_COMMUNITY)
Admission: EM | Admit: 2011-11-10 | Discharge: 2011-11-10 | Disposition: A | Discharge status: Home / Self Care | Payer: PRIVATE HEALTH INSURANCE | Attending: Emergency Medicine | Admitting: Emergency Medicine

## 2011-11-10 DIAGNOSIS — IMO0001 Reserved for inherently not codable concepts without codable children: Secondary | ICD-10-CM | POA: Insufficient documentation

## 2011-11-10 DIAGNOSIS — K1379 Other lesions of oral mucosa: Secondary | ICD-10-CM

## 2011-11-10 DIAGNOSIS — K1370 Unspecified lesions of oral mucosa: Secondary | ICD-10-CM | POA: Insufficient documentation

## 2011-11-10 DIAGNOSIS — S02609A Fracture of mandible, unspecified, initial encounter for closed fracture: Secondary | ICD-10-CM

## 2011-11-10 MED ORDER — HYDROMORPHONE HCL PF 1 MG/ML IJ SOLN
2.0000 mg | Freq: Once | INTRAMUSCULAR | Status: AC
  Administered 2011-11-10: 2 mg via INTRAMUSCULAR
  Filled 2011-11-10 (×2): qty 1

## 2011-11-10 MED ORDER — OXYCODONE HCL 5 MG/5ML PO SOLN: 5.0000 mg | ORAL | Status: DC | PRN

## 2011-11-10 NOTE — Progress Notes (Signed)
Pt in er-phone not with him Dr Luther Hearing office told him to be npo p mn Arrive 8am pt was given pain meds Not sure if he is on coumadin-if so-will need pt-ptt
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
## 2011-11-10 NOTE — ED Notes (Signed)
I received a call from Dr. Avel Sensor office to notify patient to be at Outpatient Surgery tomorrow at 8 am. Surgery originally scheduled for August 5th will be done in am. Pt states that he will be there.

## 2011-11-11 ENCOUNTER — Encounter (HOSPITAL_BASED_OUTPATIENT_CLINIC_OR_DEPARTMENT_OTHER): Payer: Self-pay | Admitting: *Deleted

## 2011-11-11 ENCOUNTER — Ambulatory Visit (HOSPITAL_BASED_OUTPATIENT_CLINIC_OR_DEPARTMENT_OTHER)
Admission: RE | Admit: 2011-11-11 | Discharge: 2011-11-11 | Disposition: A | Discharge status: Home / Self Care | Payer: PRIVATE HEALTH INSURANCE | Source: Ambulatory Visit | Attending: Otolaryngology | Admitting: Otolaryngology

## 2011-11-11 ENCOUNTER — Ambulatory Visit (HOSPITAL_BASED_OUTPATIENT_CLINIC_OR_DEPARTMENT_OTHER)
Admission: RE | Admit: 2011-11-11 | Payer: Medicare (Managed Care) | Source: Ambulatory Visit | Admitting: Otolaryngology

## 2011-11-11 ENCOUNTER — Encounter (HOSPITAL_BASED_OUTPATIENT_CLINIC_OR_DEPARTMENT_OTHER)
Admission: RE | Disposition: A | Discharge status: Home / Self Care | Payer: Self-pay | Source: Ambulatory Visit | Attending: Otolaryngology

## 2011-11-11 ENCOUNTER — Ambulatory Visit (HOSPITAL_BASED_OUTPATIENT_CLINIC_OR_DEPARTMENT_OTHER): Payer: PRIVATE HEALTH INSURANCE | Admitting: *Deleted

## 2011-11-11 DIAGNOSIS — Z8781 Personal history of (healed) traumatic fracture: Secondary | ICD-10-CM | POA: Insufficient documentation

## 2011-11-11 DIAGNOSIS — Z472 Encounter for removal of internal fixation device: Secondary | ICD-10-CM | POA: Insufficient documentation

## 2011-11-11 DIAGNOSIS — K219 Gastro-esophageal reflux disease without esophagitis: Secondary | ICD-10-CM | POA: Insufficient documentation

## 2011-11-11 DIAGNOSIS — S02609A Fracture of mandible, unspecified, initial encounter for closed fracture: Secondary | ICD-10-CM

## 2011-11-11 HISTORY — PX: MANDIBULAR HARDWARE REMOVAL: SHX5205

## 2011-11-11 LAB — POCT HEMOGLOBIN-HEMACUE: Hemoglobin: 13.9 g/dL (ref 13.0–17.0)

## 2011-11-11 LAB — PROTIME-INR
INR: 1.4 (ref 0.00–1.49)
Prothrombin Time: 17.4 seconds — ABNORMAL HIGH (ref 11.6–15.2)

## 2011-11-11 SURGERY — REMOVAL, HARDWARE, MANDIBLE
Anesthesia: Monitor Anesthesia Care | Site: Mouth | Wound class: Clean Contaminated

## 2011-11-11 MED ORDER — ONDANSETRON HCL 4 MG/2ML IJ SOLN
INTRAMUSCULAR | Status: DC | PRN
  Administered 2011-11-11: 4 mg via INTRAVENOUS

## 2011-11-11 MED ORDER — ONDANSETRON HCL 4 MG/2ML IJ SOLN: 4.0000 mg | Freq: Once | INTRAMUSCULAR | Status: DC | PRN

## 2011-11-11 MED ORDER — MIDAZOLAM HCL 5 MG/5ML IJ SOLN
INTRAMUSCULAR | Status: DC | PRN
  Administered 2011-11-11: 2 mg via INTRAVENOUS

## 2011-11-11 MED ORDER — LIDOCAINE HCL (CARDIAC) 20 MG/ML IV SOLN
INTRAVENOUS | Status: DC | PRN
  Administered 2011-11-11: 50 mg via INTRAVENOUS

## 2011-11-11 MED ORDER — LIDOCAINE-EPINEPHRINE 1 %-1:100000 IJ SOLN
INTRAMUSCULAR | Status: DC | PRN
  Administered 2011-11-11: 1 mL

## 2011-11-11 MED ORDER — HYDROMORPHONE HCL PF 1 MG/ML IJ SOLN
0.2500 mg | INTRAMUSCULAR | Status: DC | PRN
  Administered 2011-11-11: 0.5 mg via INTRAVENOUS

## 2011-11-11 MED ORDER — LACTATED RINGERS IV SOLN
INTRAVENOUS | Status: DC
  Administered 2011-11-11: 09:00:00 via INTRAVENOUS

## 2011-11-11 MED ORDER — FENTANYL CITRATE 0.05 MG/ML IJ SOLN
INTRAMUSCULAR | Status: DC | PRN
  Administered 2011-11-11: 50 ug via INTRAVENOUS

## 2011-11-11 MED ORDER — PROPOFOL 10 MG/ML IV BOLUS
INTRAVENOUS | Status: DC | PRN
  Administered 2011-11-11: 30 mg via INTRAVENOUS
  Administered 2011-11-11: 20 mg via INTRAVENOUS

## 2011-11-11 SURGICAL SUPPLY — 30 items
BLADE SURG 15 STRL LF DISP TIS (BLADE) ×1 IMPLANT
BLADE SURG 15 STRL SS (BLADE) ×1
CANISTER SUCTION 1200CC (MISCELLANEOUS) ×2 IMPLANT
CLOTH BEACON ORANGE TIMEOUT ST (SAFETY) ×2 IMPLANT
COVER MAYO STAND STRL (DRAPES) ×2 IMPLANT
DECANTER SPIKE VIAL GLASS SM (MISCELLANEOUS) IMPLANT
ELECT COATED BLADE 2.86 ST (ELECTRODE) ×2 IMPLANT
ELECT REM PT RETURN 9FT ADLT (ELECTROSURGICAL) ×2
ELECTRODE REM PT RTRN 9FT ADLT (ELECTROSURGICAL) ×1 IMPLANT
GAUZE SPONGE 4X4 12PLY STRL LF (GAUZE/BANDAGES/DRESSINGS) ×4 IMPLANT
GAUZE SPONGE 4X4 16PLY XRAY LF (GAUZE/BANDAGES/DRESSINGS) IMPLANT
GLOVE BIO SURGEON STRL SZ7.5 (GLOVE) ×2 IMPLANT
GLOVE BIOGEL M STRL SZ7.5 (GLOVE) ×2 IMPLANT
GOWN PREVENTION PLUS XLARGE (GOWN DISPOSABLE) ×4 IMPLANT
MARKER SKIN DUAL TIP RULER LAB (MISCELLANEOUS) IMPLANT
NEEDLE 27GAX1X1/2 (NEEDLE) ×2 IMPLANT
NS IRRIG 1000ML POUR BTL (IV SOLUTION) ×2 IMPLANT
PACK BASIN DAY SURGERY FS (CUSTOM PROCEDURE TRAY) ×2 IMPLANT
PENCIL BUTTON HOLSTER BLD 10FT (ELECTRODE) ×2 IMPLANT
SCISSORS WIRE ANG 4 3/4 DISP (INSTRUMENTS) IMPLANT
SHEET MEDIUM DRAPE 40X70 STRL (DRAPES) ×2 IMPLANT
SUT CHROMIC 3 0 PS 2 (SUTURE) IMPLANT
SUT CHROMIC 4 0 PS 2 18 (SUTURE) IMPLANT
SUT CHROMIC 4 0 RB 1X27 (SUTURE) IMPLANT
SYR CONTROL 10ML LL (SYRINGE) ×2 IMPLANT
TOWEL OR 17X24 6PK STRL BLUE (TOWEL DISPOSABLE) ×2 IMPLANT
TRAY DSU PREP LF (CUSTOM PROCEDURE TRAY) IMPLANT
TUBE CONNECTING 20X1/4 (TUBING) ×2 IMPLANT
WATER STERILE IRR 1000ML POUR (IV SOLUTION) IMPLANT
YANKAUER SUCT BULB TIP NO VENT (SUCTIONS) IMPLANT

## 2011-11-11 NOTE — Transfer of Care (Signed)
Immediate Anesthesia Transfer of Care Note  Patient: [name]  Procedure(s) Performed: Procedure(s) (LRB): MANDIBULAR HARDWARE REMOVAL (N/A)  Patient Location: PACU  Anesthesia Type: MAC  Level of Consciousness: awake, alert  and oriented  Airway & Oxygen Therapy: Patient Spontanous Breathing  Post-op Assessment: Report given to PACU RN, Post -op Vital signs reviewed and stable and Patient moving all extremities  Post vital signs: Reviewed and stable  Complications: No apparent anesthesia complications

## 2011-11-11 NOTE — Anesthesia Procedure Notes (Signed)
Procedure Name: MAC Date/Time: 11/11/2011 10:12 AM Performed by: Meyer Russel Pre-anesthesia Checklist: Patient identified, Emergency Drugs available, Suction available and Patient being monitored Patient Re-evaluated:Patient Re-evaluated prior to inductionOxygen Delivery Method: Nasal cannula Preoxygenation: Pre-oxygenation with 100% oxygen Intubation Type: IV induction
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
## 2011-11-11 NOTE — Anesthesia Postprocedure Evaluation (Signed)
Anesthesia Post Note  Patient: [name]  Procedure(s) Performed: Procedure(s) (LRB): MANDIBULAR HARDWARE REMOVAL (N/A)  Anesthesia type: general  Patient location: PACU  Post pain: Pain level controlled  Post assessment: Patient's Cardiovascular Status Stable  Last Vitals:  Filed Vitals:   11/11/11 1045  BP:   Pulse: 72  Temp:   Resp: 16    Post vital signs: Reviewed and stable  Level of consciousness: sedated  Complications: No apparent anesthesia complications

## 2011-11-11 NOTE — Anesthesia Preprocedure Evaluation (Addendum)
Anesthesia Evaluation  Patient identified by MRN, date of birth, ID band Patient awake    Reviewed: Allergy & Precautions, H&P , NPO status , Patient's Chart, lab work & pertinent test results  Airway   Neck ROM: Full  Mouth opening: Limited Mouth Opening  Dental   Pulmonary          Cardiovascular     Neuro/Psych    GI/Hepatic GERD-  Controlled and Medicated,  Endo/Other    Renal/GU      Musculoskeletal   Abdominal   Peds  Hematology   Anesthesia Other Findings   Reproductive/Obstetrics                           Anesthesia Physical Anesthesia Plan  ASA: II  Anesthesia Plan: MAC   Post-op Pain Management:    Induction: Intravenous  Airway Management Planned: Simple Face Mask  Additional Equipment:   Intra-op Plan:   Post-operative Plan:   Informed Consent: I have reviewed the patients History and Physical, chart, labs and discussed the procedure including the risks, benefits and alternatives for the proposed anesthesia with the patient or authorized representative who has indicated his/her understanding and acceptance.     Plan Discussed with: CRNA and Surgeon  Anesthesia Plan Comments:         Anesthesia Quick Evaluation

## 2011-11-11 NOTE — Brief Op Note (Signed)
11/11/2011  10:27 AM  PATIENT:  [name]  51 y.o. male  PRE-OPERATIVE DIAGNOSIS:  CLOSED FRACTURE OF MANDIBLE, BILATERAL  POST-OPERATIVE DIAGNOSIS:  CLOSED FRACTURE OF MANDIBLE, BILATERAL  PROCEDURE:  Procedure(s) (LRB): MANDIBULAR HARDWARE REMOVAL (N/A)  SURGEON:  Surgeon(s) and Role:    * Darletta Moll, MD - Primary  PHYSICIAN ASSISTANT:   ASSISTANTS: none   ANESTHESIA:   IV sedation and local  EBL:  Total I/O In: 400 [I.V.:400] Out: -   BLOOD ADMINISTERED:none  DRAINS: none   LOCAL MEDICATIONS USED:  LIDOCAINE   SPECIMEN:  No Specimen  DISPOSITION OF SPECIMEN:  N/A  COUNTS:  YES  TOURNIQUET:  * No tourniquets in log *  DICTATION: .Other Dictation: Dictation Number [id number]  PLAN OF CARE: Discharge to home after PACU  PATIENT DISPOSITION:  PACU - hemodynamically stable.   Delay start of Pharmacological VTE agent (>24hrs) due to surgical blood loss or risk of bleeding: not applicable

## 2011-11-12 ENCOUNTER — Encounter (HOSPITAL_BASED_OUTPATIENT_CLINIC_OR_DEPARTMENT_OTHER): Payer: Self-pay | Admitting: Otolaryngology
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
## 2011-12-12 ENCOUNTER — Emergency Department (HOSPITAL_COMMUNITY)
Admission: EM | Admit: 2011-12-12 | Discharge: 2011-12-12 | Disposition: A | Discharge status: Home / Self Care | Payer: PRIVATE HEALTH INSURANCE | Attending: Emergency Medicine | Admitting: Emergency Medicine

## 2011-12-12 ENCOUNTER — Encounter (HOSPITAL_COMMUNITY): Payer: Self-pay | Admitting: *Deleted

## 2011-12-12 DIAGNOSIS — G8929 Other chronic pain: Secondary | ICD-10-CM

## 2011-12-12 DIAGNOSIS — Z79899 Other long term (current) drug therapy: Secondary | ICD-10-CM | POA: Insufficient documentation

## 2011-12-12 DIAGNOSIS — Z86711 Personal history of pulmonary embolism: Secondary | ICD-10-CM | POA: Insufficient documentation

## 2011-12-12 DIAGNOSIS — K219 Gastro-esophageal reflux disease without esophagitis: Secondary | ICD-10-CM | POA: Insufficient documentation

## 2011-12-12 DIAGNOSIS — F411 Generalized anxiety disorder: Secondary | ICD-10-CM | POA: Insufficient documentation

## 2011-12-12 DIAGNOSIS — M129 Arthropathy, unspecified: Secondary | ICD-10-CM | POA: Insufficient documentation

## 2011-12-12 DIAGNOSIS — Z7901 Long term (current) use of anticoagulants: Secondary | ICD-10-CM | POA: Insufficient documentation

## 2011-12-12 DIAGNOSIS — F419 Anxiety disorder, unspecified: Secondary | ICD-10-CM

## 2011-12-12 DIAGNOSIS — F17200 Nicotine dependence, unspecified, uncomplicated: Secondary | ICD-10-CM | POA: Insufficient documentation

## 2011-12-12 DIAGNOSIS — IMO0002 Reserved for concepts with insufficient information to code with codable children: Secondary | ICD-10-CM | POA: Insufficient documentation

## 2011-12-12 DIAGNOSIS — J45909 Unspecified asthma, uncomplicated: Secondary | ICD-10-CM | POA: Insufficient documentation

## 2011-12-12 MED ORDER — OXYCODONE HCL 10 MG PO TABS
10.0000 mg | ORAL_TABLET | Freq: Four times a day (QID) | ORAL | Status: DC | PRN
Start: 2011-12-12 — End: 2018-03-02

## 2011-12-12 MED ORDER — ALPRAZOLAM 1 MG PO TABS: 1.0000 mg | ORAL_TABLET | Freq: Four times a day (QID) | ORAL | Status: AC | PRN

## 2011-12-12 NOTE — ED Notes (Signed)
"  panic attacks for 2 weeks, I can't sleep", Back and bil leg pain.
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
## 2011-12-12 NOTE — ED Notes (Signed)
Patient states he has been on long-term meds for pain and anxiety.  Approximately 2 weeks ago, he was put in jail and meds were stopped abruptly.  He now has increased pain and insomnia.  It is difficult to follow patient's conversation as he jumps from topic to topic.  He appears very lethargic in movements and speech is very slightly slurred.

## 2011-12-15 ENCOUNTER — Emergency Department (HOSPITAL_COMMUNITY)
Admission: EM | Admit: 2011-12-15 | Discharge: 2011-12-15 | Disposition: A | Discharge status: Home / Self Care | Payer: PRIVATE HEALTH INSURANCE | Attending: Emergency Medicine | Admitting: Emergency Medicine

## 2011-12-15 ENCOUNTER — Encounter (HOSPITAL_COMMUNITY): Payer: Self-pay | Admitting: *Deleted

## 2011-12-15 ENCOUNTER — Emergency Department (HOSPITAL_COMMUNITY): Payer: PRIVATE HEALTH INSURANCE

## 2011-12-15 DIAGNOSIS — J45909 Unspecified asthma, uncomplicated: Secondary | ICD-10-CM | POA: Insufficient documentation

## 2011-12-15 DIAGNOSIS — M25579 Pain in unspecified ankle and joints of unspecified foot: Secondary | ICD-10-CM | POA: Insufficient documentation

## 2011-12-15 DIAGNOSIS — K219 Gastro-esophageal reflux disease without esophagitis: Secondary | ICD-10-CM | POA: Insufficient documentation

## 2011-12-15 DIAGNOSIS — Z9089 Acquired absence of other organs: Secondary | ICD-10-CM | POA: Insufficient documentation

## 2011-12-15 DIAGNOSIS — J189 Pneumonia, unspecified organism: Secondary | ICD-10-CM

## 2011-12-15 DIAGNOSIS — Z79899 Other long term (current) drug therapy: Secondary | ICD-10-CM | POA: Insufficient documentation

## 2011-12-15 DIAGNOSIS — Z7901 Long term (current) use of anticoagulants: Secondary | ICD-10-CM | POA: Insufficient documentation

## 2011-12-15 DIAGNOSIS — IMO0002 Reserved for concepts with insufficient information to code with codable children: Secondary | ICD-10-CM | POA: Insufficient documentation

## 2011-12-15 DIAGNOSIS — F17200 Nicotine dependence, unspecified, uncomplicated: Secondary | ICD-10-CM | POA: Insufficient documentation

## 2011-12-15 DIAGNOSIS — G8929 Other chronic pain: Secondary | ICD-10-CM

## 2011-12-15 LAB — BASIC METABOLIC PANEL
BUN: 16 mg/dL (ref 6–23)
CO2: 30 mEq/L (ref 19–32)
Calcium: 9.3 mg/dL (ref 8.4–10.5)
Creatinine, Ser: 0.58 mg/dL (ref 0.50–1.35)
Glucose, Bld: 125 mg/dL — ABNORMAL HIGH (ref 70–99)

## 2011-12-15 LAB — CBC WITH DIFFERENTIAL/PLATELET
Eosinophils Relative: 5 % (ref 0–5)
HCT: 41.3 % (ref 39.0–52.0)
Lymphocytes Relative: 42 % (ref 12–46)
Lymphs Abs: 2.1 10*3/uL (ref 0.7–4.0)
MCH: 31.1 pg (ref 26.0–34.0)
MCV: 94.3 fL (ref 78.0–100.0)
Monocytes Absolute: 0.4 10*3/uL (ref 0.1–1.0)
Monocytes Relative: 8 % (ref 3–12)
RBC: 4.38 MIL/uL (ref 4.22–5.81)
WBC: 5.1 10*3/uL (ref 4.0–10.5)

## 2011-12-15 LAB — PROTIME-INR: INR: 1.27 (ref 0.00–1.49)

## 2011-12-15 MED ORDER — OXYCODONE-ACETAMINOPHEN 5-325 MG PO TABS: 1.0000 | ORAL_TABLET | Freq: Four times a day (QID) | ORAL | Status: AC | PRN

## 2011-12-15 MED ORDER — OXYCODONE-ACETAMINOPHEN 5-325 MG PO TABS
2.0000 | ORAL_TABLET | Freq: Once | ORAL | Status: AC
  Administered 2011-12-15: 2 via ORAL
  Filled 2011-12-15: qty 2

## 2011-12-15 MED ORDER — AZITHROMYCIN 250 MG PO TABS: 250.0000 mg | ORAL_TABLET | Freq: Every day | ORAL | Status: AC

## 2011-12-15 NOTE — ED Notes (Addendum)
Pain legs, hands, chronic, Here for pain control problems.  Using fentanyl patchs , but says they do not help.  Seen here 8/30 for similar problems.  Also says he has jaw pain, previous mandible fx.

## 2011-12-15 NOTE — ED Provider Notes (Addendum)
History  This chart was scribed for Derwood Kaplan, MD by Shari Heritage. The patient was seen in room APA07/APA07. Patient's care was started at 1118.     CSN: [id number]  Arrival date & time 12/15/11  1118   First MD Initiated Contact with Patient 12/15/11 1302      No chief complaint on file.   Patient is a 51 y.o. male presenting with musculoskeletal pain. The history is provided by the patient. No language interpreter was used.  Muscle Pain This is a chronic problem. The current episode started more than 2 days ago. The problem occurs constantly. The problem has not changed since onset.The symptoms are relieved by narcotics.    [name] is a 51 y.o. male with a history of chronic pain presents to the Emergency Department complaining of moderate to severe, constant jaw pain and moderate to severe, constant bilateral foot pain that radiates up to his knees. Patient states that he was recently incarcerated for 10 days and lost his place in his pain clinic as a result. Patient has a history of mandible fracture. He states that the wires placed in his jaw were removed recently. Patient was seen here on 8/30 with similar complaints. Patient is requesting pain medication to alleviate pain. Patient has a medical history of PE, degenerative disk disease, arthritis, GERD, hepatitis B & C, pneumonia.  Past Medical History  Diagnosis Date  . PE (pulmonary embolism)   . Degenerative disk disease   . Arthritis   . Family history of anesthesia complication     mother has N/V  . Anxiety   . GERD (gastroesophageal reflux disease)   . Headache   . Hepatitis     Hep B & C  . Pneumonia     has recurrent pneumonia, last ~1 year ago  . Asthma     has not had an attack in over a year    Past Surgical History  Procedure Date  . Cholecystectomy   . Hernia repair     umbilical  . Closed reduction mandible with mandibuloma 10/21/2011    Procedure: CLOSED REDUCTION MANDIBLE WITH  MANDIBULOMAXILLARY FUSION;  Surgeon: Darletta Moll, MD;  Location: [facility] OR;  Service: ENT;  Laterality: N/A;  MMF screws  . Orif mandibular fracture 10/21/2011    Procedure: OPEN REDUCTION INTERNAL FIXATION (ORIF) MANDIBULAR FRACTURE;  Surgeon: Darletta Moll, MD;  Location: [facility] OR;  Service: ENT;  Laterality: N/A;  . Hand surgery 20 years ago    right  . Mandibular hardware removal 11/11/2011    Procedure: MANDIBULAR HARDWARE REMOVAL;  Surgeon: Darletta Moll, MD;  Location: [facility];  Service: ENT;  Laterality: N/A;    Family History  Problem Relation Age of Onset  . Coronary artery disease Mother   . Coronary artery disease Father     History  Substance Use Topics  . Smoking status: Current Everyday Smoker -- 0.5 packs/day    Types: Cigarettes  . Smokeless tobacco: Not on file   Comment: a few cigarettes a day  . Alcohol Use: No      Review of Systems  Musculoskeletal: Positive for myalgias and arthralgias.  All other systems reviewed and are negative.    Allergies  Review of patient's allergies indicates no known allergies.  Home Medications   Current Outpatient Rx  Name Route Sig Dispense Refill  . ALBUTEROL SULFATE HFA 108 (90 BASE) MCG/ACT IN AERS Inhalation Inhale 2 puffs into the lungs every  4 (four) hours as needed. FOR SHORTNESS OF BREATH    . ALPRAZOLAM 1 MG PO TABS Oral Take 1 tablet (1 mg total) by mouth 4 (four) times daily as needed for sleep. 28 tablet 0  . VITAMIN D 2000 UNITS PO TABS Oral Take 2,000 Units by mouth daily.     Marland Kitchen ESOMEPRAZOLE MAGNESIUM 40 MG PO CPDR Oral Take 40 mg by mouth at bedtime.    Marland Kitchen FLAXSEED OIL 1200 MG PO CAPS Oral Take 1 capsule by mouth daily.    Marland Kitchen GABAPENTIN 400 MG PO CAPS Oral Take 400 mg by mouth 3 (three) times daily.    . IBUPROFEN 800 MG PO TABS Oral Take 800 mg by mouth every 8 (eight) hours as needed. Pain    . MILK THISTLE EXTRACT PO Oral Take 3,000 mg by mouth 2 (two) times daily. 1000 mg capsules 3 times daily    .  OXYCODONE HCL 10 MG PO TABS Oral Take 1 tablet (10 mg total) by mouth 4 (four) times daily as needed. 28 tablet 0  . VITAMIN E 400 UNITS PO CAPS Oral Take 400 Units by mouth daily.     . WARFARIN SODIUM 5 MG PO TABS Oral Take 15 mg by mouth daily.       BP 106/70  Pulse 86  Temp 98.3 F (36.8 C) (Oral)  Resp 20  Ht 6\' 4"  (1.93 m)  Wt 195 lb (88.451 kg)  BMI 23.74 kg/m2  SpO2 93%  Physical Exam  Constitutional: He appears well-developed and well-nourished.  HENT:  Head: Normocephalic and atraumatic.  Eyes: Pupils are equal, round, and reactive to light.       Pupils are 2 mm and equal.  Cardiovascular: Normal rate and regular rhythm.   No murmur heard. Pulses:      Dorsalis pedis pulses are 2+ on the right side, and 2+ on the left side.  Pulmonary/Chest: Effort normal. No respiratory distress. He has no wheezes. He has rhonchi (diffuse rhonchus breath sounds). He has no rales.  Abdominal: Soft. Bowel sounds are normal. There is no tenderness.  Musculoskeletal:       Bunion to the foot of LLE. +2 DP pulses.  Skin: Skin is warm and dry.    ED Course  Procedures (including critical care time) DIAGNOSTIC STUDIES: Oxygen Saturation is 93% on room air, adequate by my interpretation.    COORDINATION OF CARE: 1:07pm- Patient informed of current plan for treatment and evaluation and agrees with plan at this time.    Labs Reviewed - No data to display  No results found.   No diagnosis found.    MDM  Pt comes in with multiple pain related complains. He has chronic pains, and is managed by pain specialist - however he was recently incarcerated, and missed his pill counting and was dismissed from the pain clinic. Our exam only shows some rhonchus breath sounds - pt has cough and noted to be saturating 94% at triage. No respiratory distress, has hx of PE. We will get CXR for this and check his inr to r/o pneumonia.  As far as the pain is concerned, he has been explained that  he will not get any doses that pain specialist was providing, nor will he get a month of bridging supply as he is requesting.  Medical screening examination/treatment/procedure(s) were performed by me as the supervising physician. Scribe service was utilized for documentation only.     Medical screening examination/treatment/procedure(s) were performed by me  as the supervising physician. Scribe service was utilized for documentation only.   Derwood Kaplan, MD 12/15/11 1401  2:51 PM CXR shows left side opacity. PSI score is class I denomination and no significant co morbidities - so we will d/c with azithromucin.  Derwood Kaplan, MD 12/15/11 1452

## 2011-12-15 NOTE — ED Notes (Addendum)
Patient with no complaints at this time. Respirations even and unlabored. Skin warm/dry. Discharge instructions reviewed with patient at this time. Patient given opportunity to voice concerns/ask questions. Patient discharged at this time and left Emergency Department with steady gait.   

## 2011-12-16 ENCOUNTER — Other Ambulatory Visit (HOSPITAL_COMMUNITY): Payer: Self-pay | Admitting: General Surgery

## 2011-12-16 DIAGNOSIS — K469 Unspecified abdominal hernia without obstruction or gangrene: Secondary | ICD-10-CM

## 2011-12-18 ENCOUNTER — Ambulatory Visit (HOSPITAL_COMMUNITY): Payer: PRIVATE HEALTH INSURANCE

## 2011-12-28 ENCOUNTER — Emergency Department (HOSPITAL_COMMUNITY): Payer: PRIVATE HEALTH INSURANCE

## 2011-12-28 ENCOUNTER — Emergency Department (HOSPITAL_COMMUNITY)
Admission: EM | Admit: 2011-12-28 | Discharge: 2011-12-28 | Disposition: A | Discharge status: Home / Self Care | Payer: PRIVATE HEALTH INSURANCE | Attending: Emergency Medicine | Admitting: Emergency Medicine

## 2011-12-28 ENCOUNTER — Encounter (HOSPITAL_COMMUNITY): Payer: Self-pay | Admitting: Emergency Medicine

## 2011-12-28 DIAGNOSIS — Z79899 Other long term (current) drug therapy: Secondary | ICD-10-CM | POA: Insufficient documentation

## 2011-12-28 DIAGNOSIS — F17200 Nicotine dependence, unspecified, uncomplicated: Secondary | ICD-10-CM | POA: Insufficient documentation

## 2011-12-28 DIAGNOSIS — Z86711 Personal history of pulmonary embolism: Secondary | ICD-10-CM | POA: Insufficient documentation

## 2011-12-28 DIAGNOSIS — Y92009 Unspecified place in unspecified non-institutional (private) residence as the place of occurrence of the external cause: Secondary | ICD-10-CM | POA: Insufficient documentation

## 2011-12-28 DIAGNOSIS — W19XXXA Unspecified fall, initial encounter: Secondary | ICD-10-CM | POA: Insufficient documentation

## 2011-12-28 DIAGNOSIS — K219 Gastro-esophageal reflux disease without esophagitis: Secondary | ICD-10-CM | POA: Insufficient documentation

## 2011-12-28 DIAGNOSIS — B1920 Unspecified viral hepatitis C without hepatic coma: Secondary | ICD-10-CM | POA: Insufficient documentation

## 2011-12-28 DIAGNOSIS — J45909 Unspecified asthma, uncomplicated: Secondary | ICD-10-CM | POA: Insufficient documentation

## 2011-12-28 DIAGNOSIS — M129 Arthropathy, unspecified: Secondary | ICD-10-CM | POA: Insufficient documentation

## 2011-12-28 DIAGNOSIS — S8990XA Unspecified injury of unspecified lower leg, initial encounter: Secondary | ICD-10-CM | POA: Insufficient documentation

## 2011-12-28 DIAGNOSIS — Z7901 Long term (current) use of anticoagulants: Secondary | ICD-10-CM | POA: Insufficient documentation

## 2011-12-28 DIAGNOSIS — M79605 Pain in left leg: Secondary | ICD-10-CM

## 2011-12-28 DIAGNOSIS — F411 Generalized anxiety disorder: Secondary | ICD-10-CM | POA: Insufficient documentation

## 2011-12-28 MED ORDER — OXYCODONE-ACETAMINOPHEN 5-325 MG PO TABS
2.0000 | ORAL_TABLET | Freq: Once | ORAL | Status: AC
  Administered 2011-12-28: 2 via ORAL
  Filled 2011-12-28: qty 2

## 2011-12-28 NOTE — ED Notes (Signed)
Pt states he fell last night and landed on left side. Pts wife states he had a "few bumps" before falling on left side recently.

## 2011-12-28 NOTE — ED Notes (Signed)
Patient up out of bed without assistance.  Asked pt if he was supposed to putting weight on his leg and he stated "no", "but it doesn't matter."  Pt urged to use wheelchair and not put weight onto his casted leg, pt sat in wheelchair.   Pt very upset at time of discharge because he did not receive a stronger medication for pain, states he felt like he needed a shot and the doctor just didn't want to give him anything.  Pain management explained to patient.  Patient discharge instruction reviewed carefully with patient, who disagreed.

## 2011-12-28 NOTE — ED Notes (Signed)
Pt c/o left leg pain. Pt has cast on left leg. Pt states he fell last night and hit left side of face. Abrasion noted on right side of forehead. Pt states he thinks he has pneumonia.

## 2011-12-28 NOTE — ED Provider Notes (Signed)
History     CSN: [id number]  Arrival date & time 12/28/11  0434   First MD Initiated Contact with Patient 12/28/11 0505      Chief Complaint  Patient presents with  . Leg Pain    (Consider location/radiation/quality/duration/timing/severity/associated sxs/prior treatment) HPI [name] is a 51 y.o. male with a h/o chronic pain, PE on chronic coumadin, anxiety who presents to the Emergency Department complaining of left leg pain after a fall in his home while going to the bathroom. Patient fell on his left side.Patient is s/p cast placement to the left leg due to accident involving a table saw and injury to leg with a table saw. He is being followed by Dr. Emelda Fear, orthopedist at [facility]. He is managed by a pain clinic. He makes frequent visits to the ER for pain c/o.   Past Medical History  Diagnosis Date  . PE (pulmonary embolism)   . Degenerative disk disease   . Arthritis   . Family history of anesthesia complication     mother has N/V  . Anxiety   . GERD (gastroesophageal reflux disease)   . Headache   . Hepatitis     Hep B & C  . Pneumonia     has recurrent pneumonia, last ~1 year ago  . Asthma     has not had an attack in over a year    Past Surgical History  Procedure Date  . Cholecystectomy   . Hernia repair     umbilical  . Closed reduction mandible with mandibuloma 10/21/2011    Procedure: CLOSED REDUCTION MANDIBLE WITH MANDIBULOMAXILLARY FUSION;  Surgeon: Darletta Moll, MD;  Location: [facility] OR;  Service: ENT;  Laterality: N/A;  MMF screws  . Orif mandibular fracture 10/21/2011    Procedure: OPEN REDUCTION INTERNAL FIXATION (ORIF) MANDIBULAR FRACTURE;  Surgeon: Darletta Moll, MD;  Location: [facility] OR;  Service: ENT;  Laterality: N/A;  . Hand surgery 20 years ago    right  . Mandibular hardware removal 11/11/2011    Procedure: MANDIBULAR HARDWARE REMOVAL;  Surgeon: Darletta Moll, MD;  Location: [facility];  Service: ENT;  Laterality: N/A;    Family History    Problem Relation Age of Onset  . Coronary artery disease Mother   . Coronary artery disease Father     History  Substance Use Topics  . Smoking status: Current Every Day Smoker -- 0.5 packs/day    Types: Cigarettes  . Smokeless tobacco: Not on file   Comment: a few cigarettes a day  . Alcohol Use: No      Review of Systems  Constitutional: Negative for fever.       10 Systems reviewed and are negative for acute change except as noted in the HPI.  HENT: Negative for congestion.   Eyes: Negative for discharge and redness.  Respiratory: Negative for cough and shortness of breath.   Cardiovascular: Negative for chest pain.  Gastrointestinal: Negative for vomiting and abdominal pain.  Musculoskeletal: Negative for back pain.       Left leg pain between ankle and knee.  Skin: Negative for rash.  Neurological: Negative for syncope, numbness and headaches.  Psychiatric/Behavioral:       No behavior change.    Allergies  Review of patient's allergies indicates no known allergies.  Home Medications   Current Outpatient Rx  Name Route Sig Dispense Refill  . ALBUTEROL SULFATE HFA 108 (90 BASE) MCG/ACT IN AERS Inhalation Inhale 2 puffs into  the lungs every 4 (four) hours as needed. FOR SHORTNESS OF BREATH    . ALPRAZOLAM 1 MG PO TABS Oral Take 1 tablet (1 mg total) by mouth 4 (four) times daily as needed for sleep. 28 tablet 0  . ASPIRIN EC 81 MG PO TBEC Oral Take 81 mg by mouth daily.    Marland Kitchen VITAMIN D 2000 UNITS PO TABS Oral Take 2,000 Units by mouth daily.     Marland Kitchen ESOMEPRAZOLE MAGNESIUM 40 MG PO CPDR Oral Take 40 mg by mouth at bedtime.    Marland Kitchen FLAXSEED OIL 1200 MG PO CAPS Oral Take 1 capsule by mouth daily.    Marland Kitchen GABAPENTIN 400 MG PO CAPS Oral Take 400 mg by mouth 3 (three) times daily.    . IBUPROFEN 800 MG PO TABS Oral Take 800 mg by mouth every 8 (eight) hours as needed. Pain    . MILK THISTLE EXTRACT PO Oral Take 3,000 mg by mouth 2 (two) times daily. 1000 mg capsules 3 times daily     . OXYCODONE HCL 10 MG PO TABS Oral Take 1 tablet (10 mg total) by mouth 4 (four) times daily as needed. 28 tablet 0  . VITAMIN E 400 UNITS PO CAPS Oral Take 400 Units by mouth daily.     . WARFARIN SODIUM 5 MG PO TABS Oral Take 15 mg by mouth daily.       BP 110/67  Pulse 94  Temp 98.2 F (36.8 C) (Oral)  Resp 20  SpO2 93%  Physical Exam  Nursing note and vitals reviewed. Constitutional: He appears well-developed and well-nourished.       Awake, alert,appears intoxicated or under the influence of medications.   HENT:  Head: Normocephalic.       Abrasion to right forehead  Eyes: Right eye exhibits no discharge. Left eye exhibits no discharge.  Neck: Normal range of motion. Neck supple. No JVD present.  Cardiovascular: Normal heart sounds.   Pulmonary/Chest: Effort normal and breath sounds normal. He exhibits no tenderness.  Abdominal: Soft. There is no tenderness. There is no rebound.  Musculoskeletal: He exhibits no tenderness.       Baseline ROM, no obvious new focal weakness.Unable to perform ROM on left leg. Long leg cast in place and intact.  Lymphadenopathy:    He has no cervical adenopathy.  Neurological:       Mental status and motor strength appears baseline for patient and situation.  Skin: No rash noted.  Psychiatric: He has a normal mood and affect.    ED Course  Procedures (including critical care time)  Dg Chest 2 View  12/28/2011  *RADIOLOGY REPORT*  Clinical Data: Shortness of breath, wheezing.  CHEST - 2 VIEW  Comparison: 12/15/2011  Findings: Heart size upper normal.  Central vascular congestion. Peribronchial thickening.  Mild right infrahilar and retrocardiac opacity.  Interstitial prominence.  No pneumothorax.  Multilevel degenerative change.  IMPRESSION: Bibasilar opacities; atelectasis versus pneumonia.  Central peribronchial thickening and interstitial prominence may reflect infection or edema.   Original Report Authenticated By: Waneta Martins,  M.D.    Dg Tibia/fibula Left  12/28/2011  *RADIOLOGY REPORT*  Clinical Data: Left leg pain  LEFT TIBIA AND FIBULA - 2 VIEW  Comparison: No recent prior available  Findings: Detailed osseous evaluation degraded by overlying cast artifact.  Fracture through the proximal tibia with margins sharply defined, raising possibility for osteotomy. I have no prior radiograph to evaluate for the stability or change.  No joint effusion. Quadriceps  tendon enthesopathic change.  IMPRESSION: Linear lucency through the proximal anterior tibia.  Recommend comparison to prior as I have no prior study to evaluate for stability.   Original Report Authenticated By: Waneta Martins, M.D.      MDM  Patient with pain to left leg following a fall in his home. xrays do not reveal a new injury to leg. Original proximal anterior tibia injury noted. H/o PE and xray showing basilar atelectasis. Patient was given analgesic without relief. He was asking for IV analgesic. As he is on PO oxycodone and taking 15 mg Q4 hours I declined  Administering additional analgesic. Dx testing d/w pt and wife. Questions answered.  Verb understanding, agreeable to d/c home with outpt f/u.Patient was given a paper copy of the film. Pt stable in ED with no significant deterioration in condition.The patient appears reasonably screened and/or stabilized for discharge and I doubt any other medical condition or other [facility] requiring further screening, evaluation, or treatment in the ED at this time prior to discharge.  MDM Reviewed: previous chart, nursing note and vitals Interpretation: x-ray            Nicoletta Dress. Colon Branch, MD 12/28/11 [phone]

## 2011-12-28 NOTE — ED Notes (Signed)
States they usually give me a shot of "dilauda" and it kills it.

## 2012-10-13 IMAGING — CR DG CHEST 2V
3 series · 3 of 3 positions shown · non-contrast
Comparison: 12/15/2011

CLINICAL DATA: Shortness of breath, wheezing.

CHEST - 2 VIEW

[view not recorded (1 of 3)]
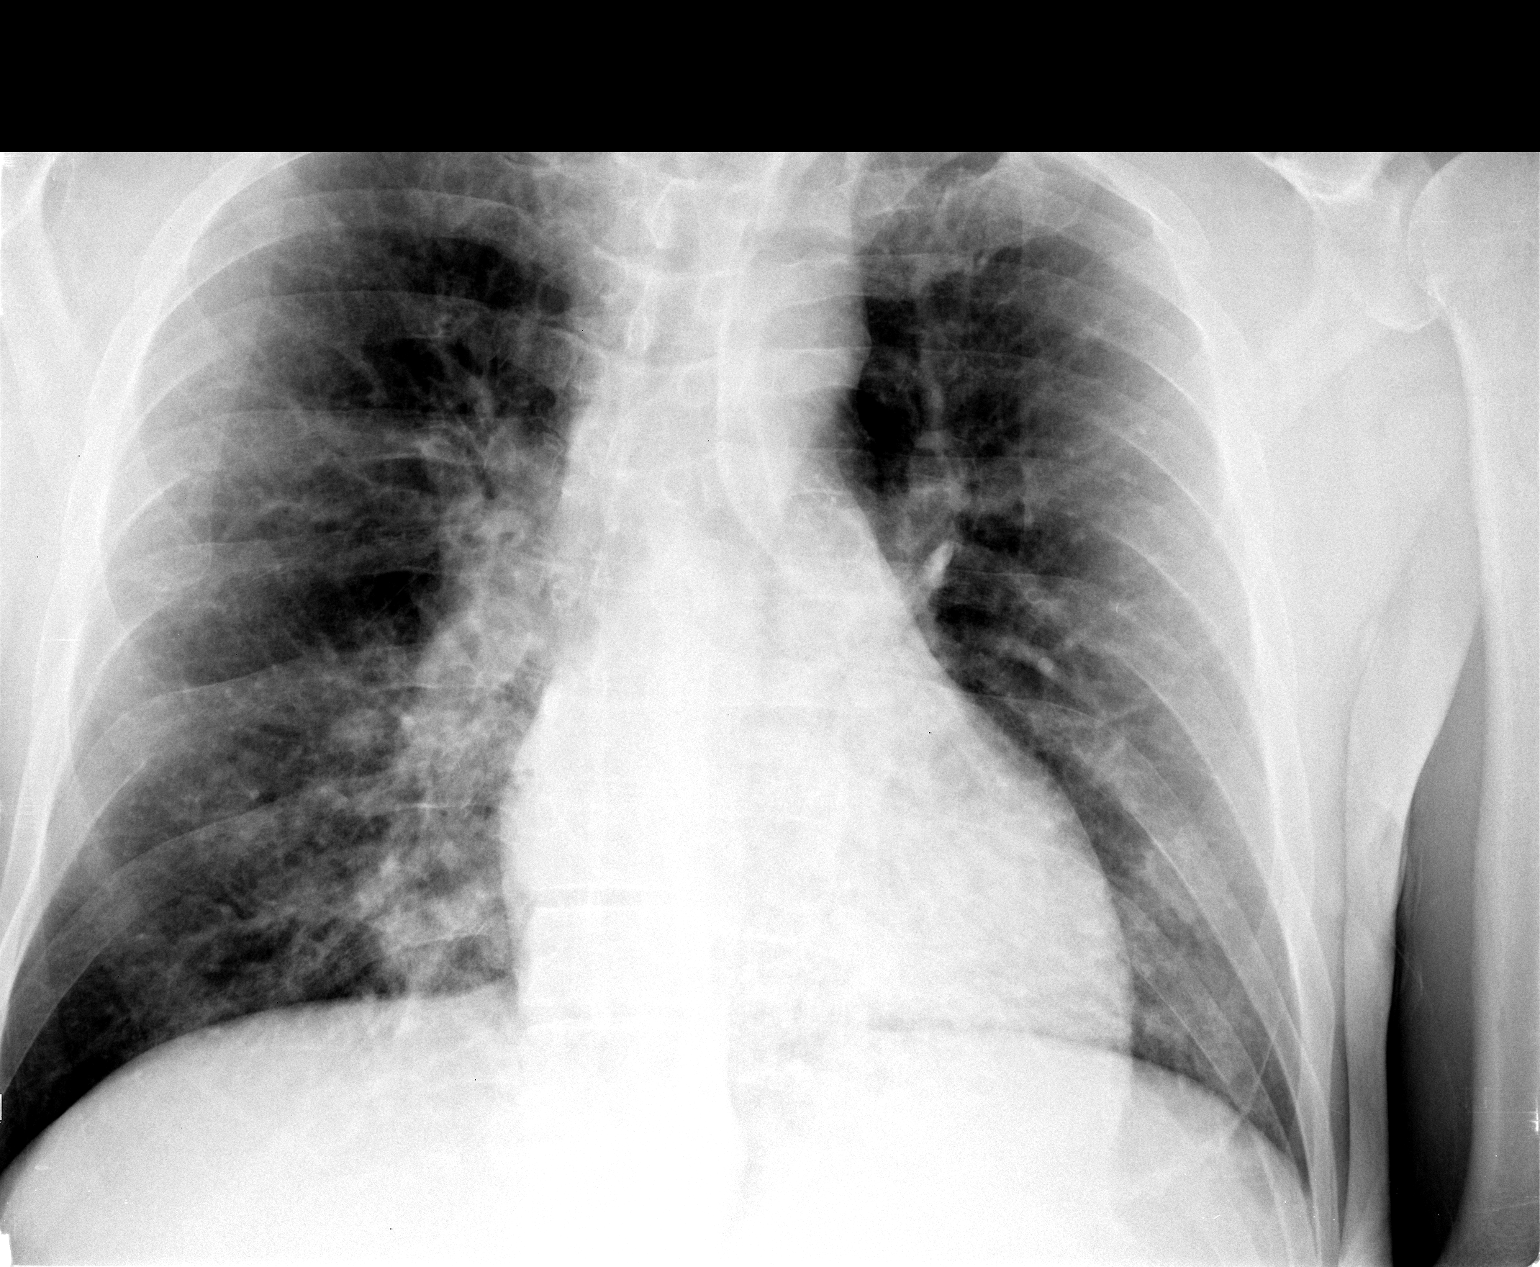

[view not recorded (2 of 3)]
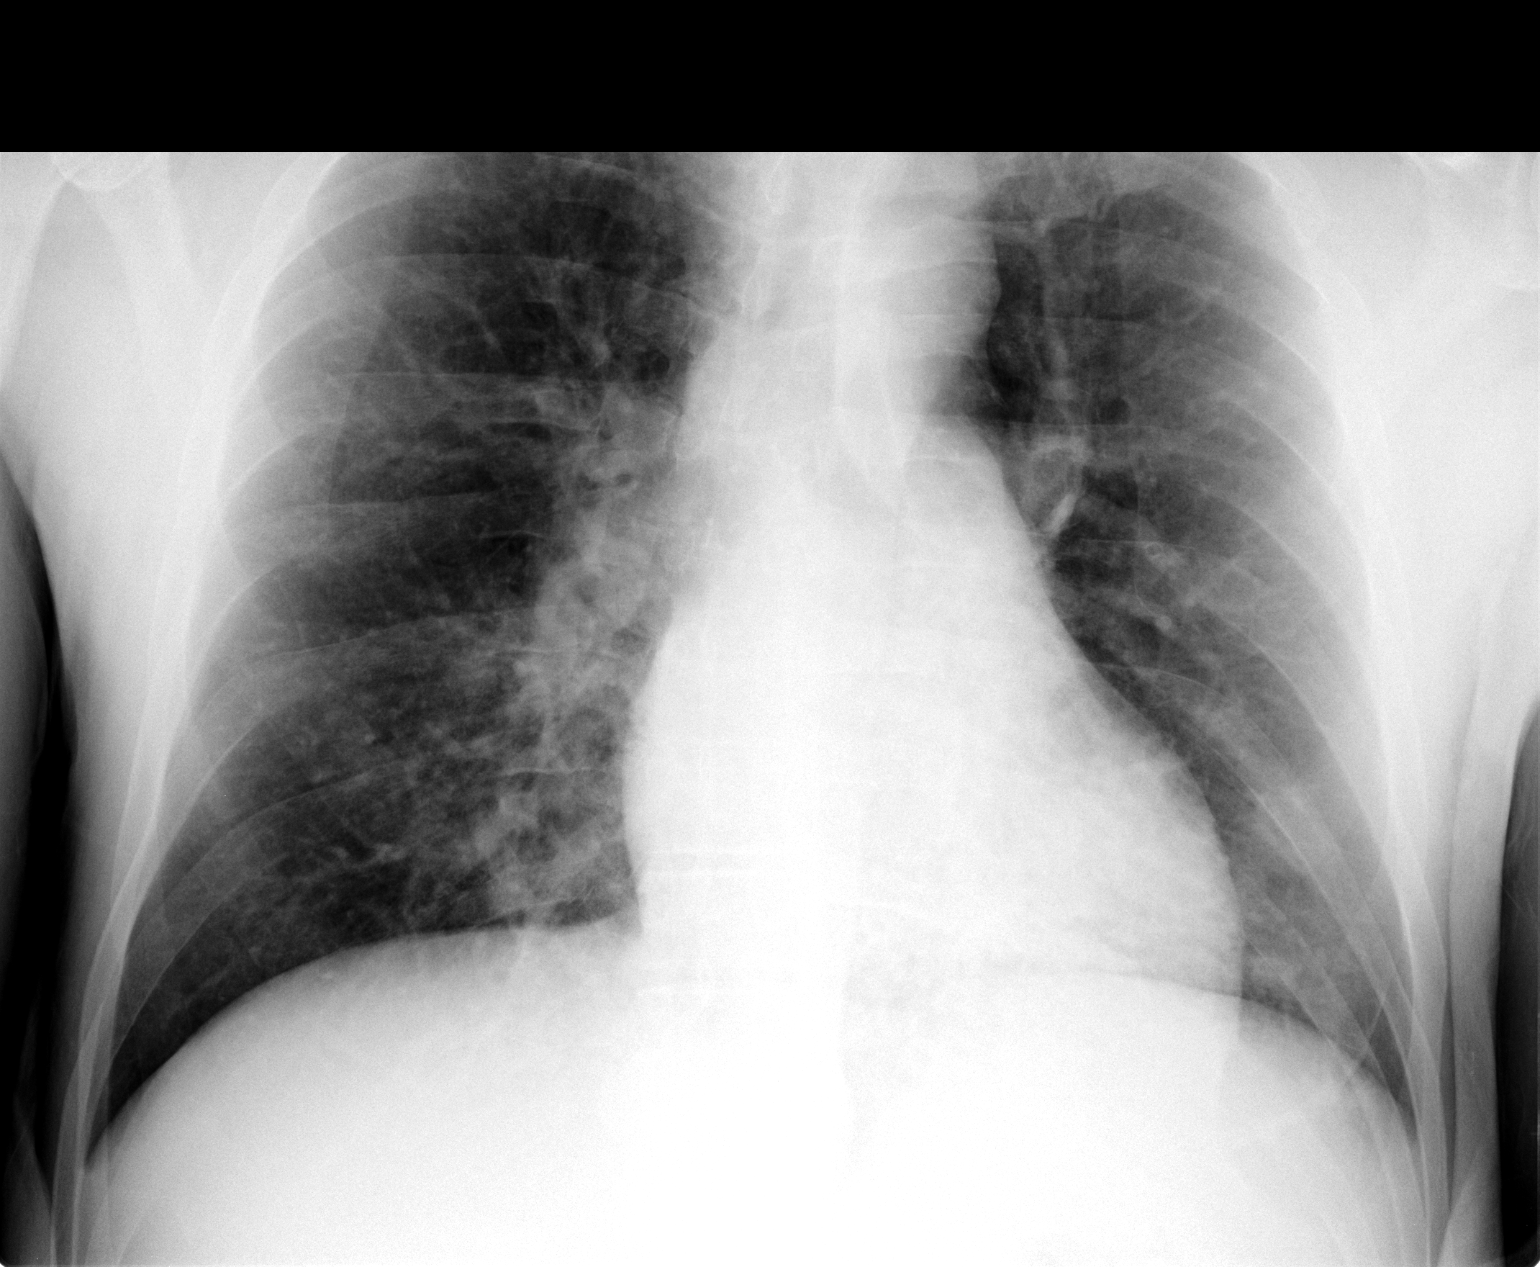

[view not recorded (3 of 3)]
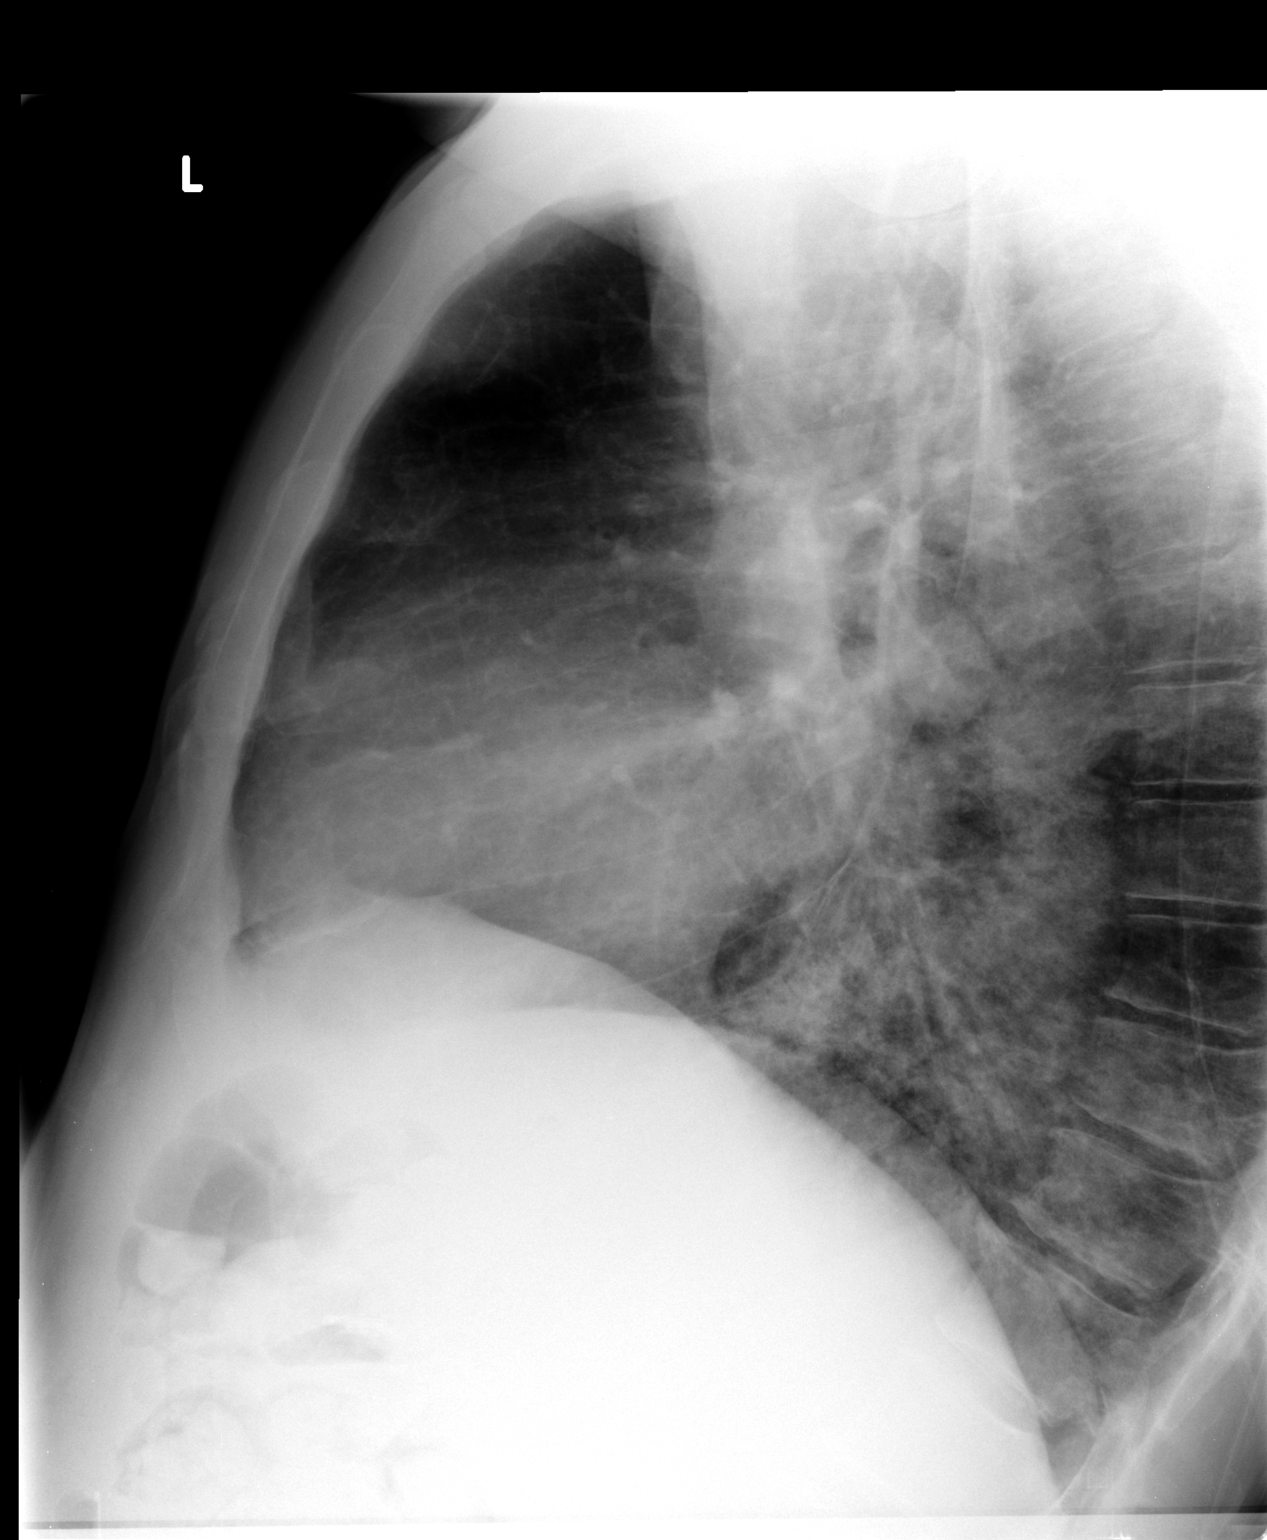

[3 of 3 positions shown; findings below may reference images not displayed]

FINDINGS: Heart size upper normal.  Central vascular congestion.
Peribronchial thickening.  Mild right infrahilar and retrocardiac
opacity.  Interstitial prominence.  No pneumothorax.  Multilevel
degenerative change.
IMPRESSION: Bibasilar opacities; atelectasis versus pneumonia.

Central peribronchial thickening and interstitial prominence may
reflect infection or edema.

## 2012-10-26 ENCOUNTER — Emergency Department (HOSPITAL_COMMUNITY)
Admission: EM | Admit: 2012-10-26 | Discharge: 2012-10-26 | Disposition: A | Discharge status: Home / Self Care | Payer: PRIVATE HEALTH INSURANCE | Attending: Emergency Medicine | Admitting: Emergency Medicine

## 2012-10-26 ENCOUNTER — Encounter (HOSPITAL_COMMUNITY): Payer: Self-pay

## 2012-10-26 DIAGNOSIS — J45909 Unspecified asthma, uncomplicated: Secondary | ICD-10-CM | POA: Insufficient documentation

## 2012-10-26 DIAGNOSIS — Z86711 Personal history of pulmonary embolism: Secondary | ICD-10-CM | POA: Insufficient documentation

## 2012-10-26 DIAGNOSIS — Z79899 Other long term (current) drug therapy: Secondary | ICD-10-CM | POA: Insufficient documentation

## 2012-10-26 DIAGNOSIS — K5289 Other specified noninfective gastroenteritis and colitis: Secondary | ICD-10-CM | POA: Insufficient documentation

## 2012-10-26 DIAGNOSIS — R112 Nausea with vomiting, unspecified: Secondary | ICD-10-CM | POA: Insufficient documentation

## 2012-10-26 DIAGNOSIS — K219 Gastro-esophageal reflux disease without esophagitis: Secondary | ICD-10-CM | POA: Insufficient documentation

## 2012-10-26 DIAGNOSIS — Z8701 Personal history of pneumonia (recurrent): Secondary | ICD-10-CM | POA: Insufficient documentation

## 2012-10-26 DIAGNOSIS — K529 Noninfective gastroenteritis and colitis, unspecified: Secondary | ICD-10-CM

## 2012-10-26 DIAGNOSIS — Z8739 Personal history of other diseases of the musculoskeletal system and connective tissue: Secondary | ICD-10-CM | POA: Insufficient documentation

## 2012-10-26 DIAGNOSIS — Z8719 Personal history of other diseases of the digestive system: Secondary | ICD-10-CM | POA: Insufficient documentation

## 2012-10-26 DIAGNOSIS — Z7901 Long term (current) use of anticoagulants: Secondary | ICD-10-CM | POA: Insufficient documentation

## 2012-10-26 DIAGNOSIS — M129 Arthropathy, unspecified: Secondary | ICD-10-CM | POA: Insufficient documentation

## 2012-10-26 DIAGNOSIS — Z7982 Long term (current) use of aspirin: Secondary | ICD-10-CM | POA: Insufficient documentation

## 2012-10-26 DIAGNOSIS — F411 Generalized anxiety disorder: Secondary | ICD-10-CM | POA: Insufficient documentation

## 2012-10-26 LAB — CBC WITH DIFFERENTIAL/PLATELET
Basophils Absolute: 0 10*3/uL (ref 0.0–0.1)
Basophils Relative: 0 % (ref 0–1)
Eosinophils Absolute: 0 10*3/uL (ref 0.0–0.7)
Hemoglobin: 14.5 g/dL (ref 13.0–17.0)
MCHC: 34.6 g/dL (ref 30.0–36.0)
Monocytes Relative: 10 % (ref 3–12)
Neutro Abs: 4 10*3/uL (ref 1.7–7.7)
Neutrophils Relative %: 72 % (ref 43–77)
Platelets: 160 10*3/uL (ref 150–400)

## 2012-10-26 LAB — PROTIME-INR
INR: 1.09 (ref 0.00–1.49)
Prothrombin Time: 13.9 seconds (ref 11.6–15.2)

## 2012-10-26 LAB — BASIC METABOLIC PANEL
Chloride: 102 mEq/L (ref 96–112)
GFR calc Af Amer: >90 mL/min (ref >90)
GFR calc non Af Amer: >90 mL/min (ref >90)
Potassium: 3.4 mEq/L — ABNORMAL LOW (ref 3.5–5.1)
Sodium: 136 mEq/L (ref 135–145)

## 2012-10-26 LAB — HEPATIC FUNCTION PANEL
AST: 12 U/L (ref 0–37)
Albumin: 3.2 g/dL — ABNORMAL LOW (ref 3.5–5.2)
Total Protein: 7 g/dL (ref 6.0–8.3)

## 2012-10-26 LAB — TROPONIN I: Troponin I: <0.3 ng/mL (ref <0.30)

## 2012-10-26 LAB — LIPASE, BLOOD: Lipase: 7 U/L — ABNORMAL LOW (ref 11–59)

## 2012-10-26 MED ORDER — ONDANSETRON HCL 4 MG/2ML IJ SOLN
4.0000 mg | Freq: Once | INTRAMUSCULAR | Status: AC
  Administered 2012-10-26: 4 mg via INTRAVENOUS
  Filled 2012-10-26: qty 2

## 2012-10-26 MED ORDER — SODIUM CHLORIDE 0.9 % IV BOLUS (SEPSIS)
1000.0000 mL | Freq: Once | INTRAVENOUS | Status: AC
  Administered 2012-10-26: 1000 mL via INTRAVENOUS

## 2012-10-26 MED ORDER — SODIUM CHLORIDE 0.9 % IV SOLN
Freq: Once | INTRAVENOUS | Status: AC
  Administered 2012-10-26: 17:00:00 via INTRAVENOUS

## 2012-10-26 MED ORDER — HYDROMORPHONE HCL PF 1 MG/ML IJ SOLN
0.5000 mg | Freq: Once | INTRAMUSCULAR | Status: AC
  Administered 2012-10-26: 0.5 mg via INTRAVENOUS
  Filled 2012-10-26: qty 1

## 2012-10-26 MED ORDER — ONDANSETRON 4 MG PO TBDP: ORAL_TABLET | ORAL | Status: DC

## 2012-10-26 MED ORDER — DICYCLOMINE HCL 20 MG PO TABS: ORAL_TABLET | ORAL | Status: DC

## 2012-10-26 MED ORDER — CIPROFLOXACIN HCL 500 MG PO TABS: 500.0000 mg | ORAL_TABLET | Freq: Two times a day (BID) | ORAL | Status: DC

## 2012-10-26 NOTE — ED Provider Notes (Signed)
History  This chart was scribed for Benny Lennert, MD, by Candelaria Stagers, ED Scribe. This patient was seen in room APA12/APA12 and the patient's care was started at 3:49 PM  CSN: [id number] Arrival date & time 10/26/12  1520  First MD Initiated Contact with Patient 10/26/12 1539     Chief Complaint  Patient presents with  . Diarrhea  . Emesis    Patient is a 52 y.o. male presenting with diarrhea and vomiting. The history is provided by the patient. No language interpreter was used.  Diarrhea Quality:  Bloody Severity:  Moderate Onset quality:  Sudden Duration:  4 days Timing:  Intermittent Progression:  Unchanged Relieved by:  Nothing Worsened by:  Nothing tried Ineffective treatments:  None tried Associated symptoms: vomiting   Risk factors: no recent antibiotic use   Emesis Associated symptoms: diarrhea    HPI Comments: [name] is a 52 y.o. male who presents to the Emergency Department complaining of diarrhea that started four days ago with blood in stool that started two days ago.  Pt is now experiencing emesis that started yesterday.  He is also experiencing associated symptoms of cough and fatigue.  Nothing seems to make the sx better or worse.  Pt takes xarelto.    PCP Dr. Farris Has   Past Medical History  Diagnosis Date  . PE (pulmonary embolism)   . Degenerative disk disease   . Arthritis   . Family history of anesthesia complication     mother has N/V  . Anxiety   . GERD (gastroesophageal reflux disease)   . Headache(784.0)   . Hepatitis     Hep B & C  . Pneumonia     has recurrent pneumonia, last ~1 year ago  . Asthma     has not had an attack in over a year   Past Surgical History  Procedure Laterality Date  . Cholecystectomy    . Hernia repair      umbilical  . Closed reduction mandible with mandibuloma  10/21/2011    Procedure: CLOSED REDUCTION MANDIBLE WITH MANDIBULOMAXILLARY FUSION;  Surgeon: Darletta Moll, MD;  Location: [facility] OR;  Service:  ENT;  Laterality: N/A;  MMF screws  . Orif mandibular fracture  10/21/2011    Procedure: OPEN REDUCTION INTERNAL FIXATION (ORIF) MANDIBULAR FRACTURE;  Surgeon: Darletta Moll, MD;  Location: [facility] OR;  Service: ENT;  Laterality: N/A;  . Hand surgery  20 years ago    right  . Mandibular hardware removal  11/11/2011    Procedure: MANDIBULAR HARDWARE REMOVAL;  Surgeon: Darletta Moll, MD;  Location: [facility];  Service: ENT;  Laterality: N/A;  . Leg surgery     Family History  Problem Relation Age of Onset  . Coronary artery disease Mother   . Coronary artery disease Father    History  Substance Use Topics  . Smoking status: Current Every Day Smoker -- 0.50 packs/day    Types: Cigarettes  . Smokeless tobacco: Not on file     Comment: a few cigarettes a day  . Alcohol Use: No    Review of Systems  Gastrointestinal: Positive for nausea, vomiting and diarrhea.  All other systems reviewed and are negative.    Allergies  Review of patient's allergies indicates no known allergies.  Home Medications   Current Outpatient Rx  Name  Route  Sig  Dispense  Refill  . aspirin EC 81 MG tablet   Oral   Take 81  mg by mouth daily.         . Cholecalciferol (VITAMIN D) 2000 UNITS tablet   Oral   Take 2,000 Units by mouth daily.          . citalopram (CELEXA) 40 MG tablet   Oral   Take 40 mg by mouth daily.         . clonazePAM (KLONOPIN) 1 MG tablet   Oral   Take 1 mg by mouth 4 (four) times daily.         Marland Kitchen esomeprazole (NEXIUM) 40 MG capsule   Oral   Take 40 mg by mouth at bedtime.         . Flaxseed, Linseed, (FLAXSEED OIL) 1200 MG CAPS   Oral   Take 1 capsule by mouth daily.         Marland Kitchen gabapentin (NEURONTIN) 400 MG capsule   Oral   Take 400 mg by mouth 3 (three) times daily.         Marland Kitchen MILK THISTLE EXTRACT PO   Oral   Take 3,000 mg by mouth 2 (two) times daily. 1000 mg capsules 3 times daily         . Oxycodone HCl 10 MG TABS   Oral   Take 1 tablet  (10 mg total) by mouth 4 (four) times daily as needed.   28 tablet   0   . risperiDONE (RISPERDAL) 2 MG tablet   Oral   Take 2 mg by mouth at bedtime.         . Rivaroxaban (XARELTO) 20 MG TABS   Oral   Take 20 mg by mouth daily.         . vitamin E (VITAMIN E) 400 UNIT capsule   Oral   Take 400 Units by mouth daily.          Marland Kitchen albuterol (PROVENTIL HFA;VENTOLIN HFA) 108 (90 BASE) MCG/ACT inhaler   Inhalation   Inhale 2 puffs into the lungs every 4 (four) hours as needed. FOR SHORTNESS OF BREATH          BP 114/75  Pulse 83  Temp(Src) 98.2 F (36.8 C) (Oral)  Resp 20  Ht 6\' 4"  (1.93 m)  Wt 210 lb (95.255 kg)  BMI 25.57 kg/m2  SpO2 96% Physical Exam  Nursing note and vitals reviewed. Constitutional: He is oriented to person, place, and time. He appears well-developed and well-nourished. No distress.  HENT:  Head: Normocephalic and atraumatic.  Eyes: EOM are normal.  Neck: Neck supple. No tracheal deviation present.  Cardiovascular: Normal rate.   Pulmonary/Chest: Effort normal. No respiratory distress.  Abdominal: There is tenderness (mild suprapubic tenderness).  Musculoskeletal: Normal range of motion.  Neurological: He is alert and oriented to person, place, and time.  Skin: Skin is warm and dry.  Psychiatric: He has a normal mood and affect. His behavior is normal.    ED Course  Procedures   DIAGNOSTIC STUDIES: Oxygen Saturation is 96% on room air, normal by my interpretation.    COORDINATION OF CARE:  3:52 PM Discussed course of care with pt which includes basic blood work and zophran.  Pt understands and agrees.   6:19 PM Discussed lab results with pt.  Pt reports he is feeling better.  Advised pt to follow up with PCP if sx persist.   Labs Reviewed  BASIC METABOLIC PANEL - Abnormal; Notable for the following:    Potassium 3.4 (*)    Glucose, Bld 122 (*)  All other components within normal limits  HEPATIC FUNCTION PANEL - Abnormal; Notable  for the following:    Albumin 3.2 (*)    All other components within normal limits  LIPASE, BLOOD - Abnormal; Notable for the following:    Lipase 7 (*)    All other components within normal limits  CBC WITH DIFFERENTIAL  TROPONIN I  PROTIME-INR   No results found. No diagnosis found.  MDM   The chart was scribed for me under my direct supervision.  I personally performed the history, physical, and medical decision making and all procedures in the evaluation of this patient.Benny Lennert, MD 10/26/12 [phone]

## 2012-10-26 NOTE — ED Notes (Addendum)
Pt c/o diarrhea and abd pain x 4 days.  Reports started vomiting yesterday.  Pt reports today has had bright red blood in stool.  Wife says stool is "pure blood" today.  Pt also says woke up around 4am this morning with severe chest pain and was diaphoretic.  Denies cp at present.

## 2013-08-01 ENCOUNTER — Emergency Department (HOSPITAL_COMMUNITY): Payer: Medicare HMO

## 2013-08-01 ENCOUNTER — Emergency Department (HOSPITAL_COMMUNITY)
Admission: EM | Admit: 2013-08-01 | Discharge: 2013-08-01 | Disposition: A | Discharge status: Home / Self Care | Payer: Medicare HMO | Attending: Emergency Medicine | Admitting: Emergency Medicine

## 2013-08-01 ENCOUNTER — Encounter (HOSPITAL_COMMUNITY): Payer: Self-pay | Admitting: Emergency Medicine

## 2013-08-01 DIAGNOSIS — Z7982 Long term (current) use of aspirin: Secondary | ICD-10-CM | POA: Insufficient documentation

## 2013-08-01 DIAGNOSIS — K219 Gastro-esophageal reflux disease without esophagitis: Secondary | ICD-10-CM | POA: Insufficient documentation

## 2013-08-01 DIAGNOSIS — J45909 Unspecified asthma, uncomplicated: Secondary | ICD-10-CM | POA: Insufficient documentation

## 2013-08-01 DIAGNOSIS — R079 Chest pain, unspecified: Secondary | ICD-10-CM | POA: Insufficient documentation

## 2013-08-01 DIAGNOSIS — J159 Unspecified bacterial pneumonia: Secondary | ICD-10-CM | POA: Insufficient documentation

## 2013-08-01 DIAGNOSIS — M129 Arthropathy, unspecified: Secondary | ICD-10-CM | POA: Insufficient documentation

## 2013-08-01 DIAGNOSIS — Z86711 Personal history of pulmonary embolism: Secondary | ICD-10-CM | POA: Insufficient documentation

## 2013-08-01 DIAGNOSIS — F411 Generalized anxiety disorder: Secondary | ICD-10-CM | POA: Insufficient documentation

## 2013-08-01 DIAGNOSIS — J189 Pneumonia, unspecified organism: Secondary | ICD-10-CM

## 2013-08-01 DIAGNOSIS — Z79899 Other long term (current) drug therapy: Secondary | ICD-10-CM | POA: Insufficient documentation

## 2013-08-01 DIAGNOSIS — Z792 Long term (current) use of antibiotics: Secondary | ICD-10-CM | POA: Insufficient documentation

## 2013-08-01 DIAGNOSIS — R61 Generalized hyperhidrosis: Secondary | ICD-10-CM | POA: Insufficient documentation

## 2013-08-01 DIAGNOSIS — F17200 Nicotine dependence, unspecified, uncomplicated: Secondary | ICD-10-CM | POA: Insufficient documentation

## 2013-08-01 LAB — COMPREHENSIVE METABOLIC PANEL
ALBUMIN: 3.4 g/dL — AB (ref 3.5–5.2)
ALT: 7 U/L (ref 0–53)
AST: 12 U/L (ref 0–37)
Alkaline Phosphatase: 57 U/L (ref 39–117)
BILIRUBIN TOTAL: 1.2 mg/dL (ref 0.3–1.2)
BUN: 13 mg/dL (ref 6–23)
CHLORIDE: 99 meq/L (ref 96–112)
CO2: 26 mEq/L (ref 19–32)
CREATININE: 0.75 mg/dL (ref 0.50–1.35)
Calcium: 8.7 mg/dL (ref 8.4–10.5)
GFR calc Af Amer: >90 mL/min (ref >90)
GFR calc non Af Amer: >90 mL/min (ref >90)
Glucose, Bld: 137 mg/dL — ABNORMAL HIGH (ref 70–99)
Potassium: 3.7 mEq/L (ref 3.7–5.3)
Sodium: 136 mEq/L — ABNORMAL LOW (ref 137–147)
Total Protein: 6.9 g/dL (ref 6.0–8.3)

## 2013-08-01 LAB — CBC
HEMATOCRIT: 40.2 % (ref 39.0–52.0)
Hemoglobin: 13 g/dL (ref 13.0–17.0)
MCH: 31 pg (ref 26.0–34.0)
MCHC: 32.3 g/dL (ref 30.0–36.0)
MCV: 95.7 fL (ref 78.0–100.0)
Platelets: 132 10*3/uL — ABNORMAL LOW (ref 150–400)
RBC: 4.2 MIL/uL — ABNORMAL LOW (ref 4.22–5.81)
RDW: 14 % (ref 11.5–15.5)
WBC: 17.8 10*3/uL — AB (ref 4.0–10.5)

## 2013-08-01 LAB — TROPONIN I

## 2013-08-01 LAB — D-DIMER, QUANTITATIVE (NOT AT ARMC): D DIMER QUANT: 0.52 ug{FEU}/mL — AB (ref 0.00–0.48)

## 2013-08-01 MED ORDER — LEVOFLOXACIN 500 MG PO TABS: 500.0000 mg | ORAL_TABLET | Freq: Every day | ORAL | Status: DC

## 2013-08-01 MED ORDER — LEVOFLOXACIN IN D5W 750 MG/150ML IV SOLN
750.0000 mg | Freq: Once | INTRAVENOUS | Status: AC
  Administered 2013-08-01: 750 mg via INTRAVENOUS
  Filled 2013-08-01: qty 150

## 2013-08-01 MED ORDER — OXYCODONE-ACETAMINOPHEN 5-325 MG PO TABS: 1.0000 | ORAL_TABLET | Freq: Four times a day (QID) | ORAL | Status: DC | PRN

## 2013-08-01 MED ORDER — ONDANSETRON HCL 4 MG/2ML IJ SOLN
4.0000 mg | Freq: Once | INTRAMUSCULAR | Status: AC
  Administered 2013-08-01: 4 mg via INTRAVENOUS
  Filled 2013-08-01: qty 2

## 2013-08-01 MED ORDER — OXYCODONE-ACETAMINOPHEN 5-325 MG PO TABS
1.0000 | ORAL_TABLET | Freq: Once | ORAL | Status: AC
  Administered 2013-08-01: 1 via ORAL
  Filled 2013-08-01: qty 1

## 2013-08-01 MED ORDER — HYDROMORPHONE HCL PF 1 MG/ML IJ SOLN
1.0000 mg | Freq: Once | INTRAMUSCULAR | Status: AC
  Administered 2013-08-01: 1 mg via INTRAVENOUS
  Filled 2013-08-01: qty 1

## 2013-08-01 MED ORDER — IOHEXOL 350 MG/ML SOLN
100.0000 mL | Freq: Once | INTRAVENOUS | Status: AC | PRN
  Administered 2013-08-01: 100 mL via INTRAVENOUS

## 2013-08-01 NOTE — Discharge Instructions (Signed)
Follow up with your md in 2 days.  Return sooner if problems

## 2013-08-01 NOTE — ED Notes (Signed)
Pt requesting pain medication. Dr. Estell HarpinZammit aware and new order for percocet

## 2013-08-01 NOTE — ED Notes (Signed)
Dc instructions reviewed with pt and voiced understanding. nad noted. rx given ambulated out without difficulty.

## 2013-08-01 NOTE — ED Notes (Signed)
Pt states hight fever last week and during the night. PT reports fever of 105 last night. Pt also states "pain to lungs" which began at 0100 with chills. PT denies cough but states COPD and hx of PEs. Pt took one NTG at 0300 without reflief.

## 2013-08-01 NOTE — ED Provider Notes (Signed)
CSN: [id number]     Arrival date & time 08/01/13  4098 History  This chart was scribed for Benny Lennert, MD by Beverly Milch, ED Scribe. This patient was seen in room APA04/APA04 and the patient's care was started at 9:39 AM.  Chief Complaint  Patient presents with  . Fever      Patient is a 53 y.o. male presenting with fever. The history is provided by the patient. No language interpreter was used.  Fever Max temp prior to arrival:   105.42F Temp source:  Oral Severity:  Moderate Onset quality:  Gradual Duration:  7 days Timing:  Intermittent (Pt reports fevers at night.) Progression:  Worsening Chronicity:  New Relieved by:  Nothing Worsened by:  Exertion and movement (Deep breathing) Ineffective treatments:  None tried Associated symptoms: chest pain (pt reports lung pain), chills and cough (mild nonproductive)   Associated symptoms: no congestion and no diarrhea   Risk factors comment:  H/o COPD and PEs. Pt reports he does smoke cigarettes. Pt reports Dr. Farris Has is his PCP.  Past Medical History  Diagnosis Date  . PE (pulmonary embolism)   . Degenerative disk disease   . Arthritis   . Family history of anesthesia complication     mother has N/V  . Anxiety   . GERD (gastroesophageal reflux disease)   . Headache(784.0)   . Hepatitis     Hep B & C  . Pneumonia     has recurrent pneumonia, last ~1 year ago  . Asthma     has not had an attack in over a year   Past Surgical History  Procedure Laterality Date  . Cholecystectomy    . Hernia repair      umbilical  . Closed reduction mandible with mandibuloma  10/21/2011    Procedure: CLOSED REDUCTION MANDIBLE WITH MANDIBULOMAXILLARY FUSION;  Surgeon: Darletta Moll, MD;  Location: [facility] OR;  Service: ENT;  Laterality: N/A;  MMF screws  . Orif mandibular fracture  10/21/2011    Procedure: OPEN REDUCTION INTERNAL FIXATION (ORIF) MANDIBULAR FRACTURE;  Surgeon: Darletta Moll, MD;  Location: [facility] OR;  Service: ENT;  Laterality: N/A;   . Hand surgery  20 years ago    right  . Mandibular hardware removal  11/11/2011    Procedure: MANDIBULAR HARDWARE REMOVAL;  Surgeon: Darletta Moll, MD;  Location: [facility];  Service: ENT;  Laterality: N/A;  . Leg surgery     Family History  Problem Relation Age of Onset  . Coronary artery disease Mother   . Coronary artery disease Father    History  Substance Use Topics  . Smoking status: Current Every Day Smoker -- 0.50 packs/day    Types: Cigarettes  . Smokeless tobacco: Not on file     Comment: a few cigarettes a day  . Alcohol Use: No    Review of Systems  Constitutional: Positive for fever and chills. Negative for appetite change and fatigue.  HENT: Negative for congestion, ear discharge and sinus pressure.   Eyes: Negative for discharge.  Respiratory: Positive for cough (mild nonproductive).   Cardiovascular: Positive for chest pain (pt reports lung pain).  Gastrointestinal: Negative for diarrhea.  Genitourinary: Negative for frequency and hematuria.  Musculoskeletal: Negative for back pain.  Neurological: Negative for seizures.  Psychiatric/Behavioral: Negative for hallucinations.      Allergies  Review of patient's allergies indicates no known allergies.  Home Medications   Prior to Admission medications   Medication  Sig Start Date End Date Taking? Authorizing Provider  albuterol (PROVENTIL HFA;VENTOLIN HFA) 108 (90 BASE) MCG/ACT inhaler Inhale 2 puffs into the lungs every 4 (four) hours as needed. FOR SHORTNESS OF BREATH    Historical Provider, MD  aspirin EC 81 MG tablet Take 81 mg by mouth daily.    Historical Provider, MD  Cholecalciferol (VITAMIN D) 2000 UNITS tablet Take 2,000 Units by mouth daily.     Historical Provider, MD  ciprofloxacin (CIPRO) 500 MG tablet Take 1 tablet (500 mg total) by mouth 2 (two) times daily. One po bid x 7 days 10/26/12   Benny LennertJoseph L Amanada Philbrick, MD  citalopram (CELEXA) 40 MG tablet Take 40 mg by mouth daily.     Historical Provider, MD  clonazePAM (KLONOPIN) 1 MG tablet Take 1 mg by mouth 4 (four) times daily.    Historical Provider, MD  dicyclomine (BENTYL) 20 MG tablet Take one every 6-8 hours for abd. cramping 10/26/12   Benny LennertJoseph L Gizella Belleville, MD  esomeprazole (NEXIUM) 40 MG capsule Take 40 mg by mouth at bedtime.    Historical Provider, MD  Flaxseed, Linseed, (FLAXSEED OIL) 1200 MG CAPS Take 1 capsule by mouth daily.    Historical Provider, MD  gabapentin (NEURONTIN) 400 MG capsule Take 400 mg by mouth 3 (three) times daily.    Historical Provider, MD  MILK THISTLE EXTRACT PO Take 3,000 mg by mouth 2 (two) times daily. 1000 mg capsules 3 times daily    Historical Provider, MD  ondansetron (ZOFRAN ODT) 4 MG disintegrating tablet 4mg  ODT q6 hours prn nausea/vomit 10/26/12   Benny LennertJoseph L Meilah Delrosario, MD  Oxycodone HCl 10 MG TABS Take 1 tablet (10 mg total) by mouth 4 (four) times daily as needed. 12/12/11   Carleene CooperAlan Davidson III, MD  risperiDONE (RISPERDAL) 2 MG tablet Take 2 mg by mouth at bedtime.    Historical Provider, MD  Rivaroxaban (XARELTO) 20 MG TABS Take 20 mg by mouth daily.    Historical Provider, MD  vitamin E (VITAMIN E) 400 UNIT capsule Take 400 Units by mouth daily.     Historical Provider, MD   Triage Vitals: BP 123/70  Pulse 85  Temp(Src) 98 F (36.7 C) (Oral)  Resp 20  Ht 6\' 4"  (1.93 m)  Wt 220 lb (99.791 kg)  BMI 26.79 kg/m2  SpO2 94%  Physical Exam  Nursing note and vitals reviewed. Constitutional: He is oriented to person, place, and time. He appears well-developed.  HENT:  Head: Normocephalic.  Mouth/Throat: Mucous membranes are dry.  Eyes: Conjunctivae and EOM are normal. No scleral icterus.  Neck: Neck supple. No thyromegaly present.  Cardiovascular: Normal rate and regular rhythm.  Exam reveals no gallop and no friction rub.   No murmur heard. Pulmonary/Chest: No stridor. He has wheezes (minimal wheezing bilaterally). He has no rales. He exhibits tenderness (right anterior).   Abdominal: He exhibits no distension. There is no tenderness. There is no rebound.  Musculoskeletal: Normal range of motion. He exhibits no edema.  Lymphadenopathy:    He has no cervical adenopathy.  Neurological: He is oriented to person, place, and time. He exhibits normal muscle tone. Coordination normal.  Skin: No rash noted. He is diaphoretic. No erythema.  Psychiatric: He has a normal mood and affect. His behavior is normal.    ED Course  Procedures (including critical care time)  DIAGNOSTIC STUDIES: Oxygen Saturation is 94% on RA, low by my interpretation.    COORDINATION OF CARE: 9:44 AM- Pt advised of plan for  treatment and pt agrees.   Labs Review Labs Reviewed  CBC - Abnormal; Notable for the following:    WBC 17.8 (*)    RBC 4.20 (*)    Platelets 132 (*)    All other components within normal limits  COMPREHENSIVE METABOLIC PANEL  TROPONIN I  D-DIMER, QUANTITATIVE    Imaging Review No results found.   EKG Interpretation None      MDM   Final diagnoses:  None    Pneumonia.  Pt did not want admission.  Pt will be txed with levaquin and close follow up with pcpc  The chart was scribed for me under my direct supervision.  I personally performed the history, physical, and medical decision making and all procedures in the evaluation of this patient.Benny Lennert.   Ayyub Krall L Abdulkarim Eberlin, MD 08/01/13 [phone]

## 2013-08-06 LAB — CULTURE, BLOOD (ROUTINE X 2)
CULTURE: NO GROWTH
CULTURE: NO GROWTH

## 2013-08-17 ENCOUNTER — Emergency Department (HOSPITAL_COMMUNITY): Payer: Medicare HMO

## 2013-08-17 ENCOUNTER — Inpatient Hospital Stay (HOSPITAL_COMMUNITY)
Admission: EM | Admit: 2013-08-17 | Discharge: 2013-08-19 | DRG: 194 | Disposition: A | Discharge status: Home / Self Care | Payer: Medicare HMO | Attending: Internal Medicine | Admitting: Internal Medicine

## 2013-08-17 ENCOUNTER — Encounter (HOSPITAL_COMMUNITY): Payer: Self-pay | Admitting: Emergency Medicine

## 2013-08-17 DIAGNOSIS — Z7982 Long term (current) use of aspirin: Secondary | ICD-10-CM

## 2013-08-17 DIAGNOSIS — Z79899 Other long term (current) drug therapy: Secondary | ICD-10-CM

## 2013-08-17 DIAGNOSIS — J441 Chronic obstructive pulmonary disease with (acute) exacerbation: Secondary | ICD-10-CM | POA: Diagnosis present

## 2013-08-17 DIAGNOSIS — F419 Anxiety disorder, unspecified: Secondary | ICD-10-CM | POA: Diagnosis present

## 2013-08-17 DIAGNOSIS — F411 Generalized anxiety disorder: Secondary | ICD-10-CM | POA: Diagnosis present

## 2013-08-17 DIAGNOSIS — M169 Osteoarthritis of hip, unspecified: Secondary | ICD-10-CM | POA: Diagnosis present

## 2013-08-17 DIAGNOSIS — J189 Pneumonia, unspecified organism: Principal | ICD-10-CM | POA: Diagnosis present

## 2013-08-17 DIAGNOSIS — M1990 Unspecified osteoarthritis, unspecified site: Secondary | ICD-10-CM | POA: Diagnosis present

## 2013-08-17 DIAGNOSIS — B1920 Unspecified viral hepatitis C without hepatic coma: Secondary | ICD-10-CM | POA: Diagnosis present

## 2013-08-17 DIAGNOSIS — F17200 Nicotine dependence, unspecified, uncomplicated: Secondary | ICD-10-CM | POA: Diagnosis present

## 2013-08-17 DIAGNOSIS — F329 Major depressive disorder, single episode, unspecified: Secondary | ICD-10-CM | POA: Diagnosis present

## 2013-08-17 DIAGNOSIS — Z8701 Personal history of pneumonia (recurrent): Secondary | ICD-10-CM

## 2013-08-17 DIAGNOSIS — Z86711 Personal history of pulmonary embolism: Secondary | ICD-10-CM | POA: Diagnosis present

## 2013-08-17 DIAGNOSIS — E876 Hypokalemia: Secondary | ICD-10-CM | POA: Diagnosis present

## 2013-08-17 DIAGNOSIS — G8929 Other chronic pain: Secondary | ICD-10-CM | POA: Diagnosis present

## 2013-08-17 DIAGNOSIS — J4489 Other specified chronic obstructive pulmonary disease: Secondary | ICD-10-CM | POA: Diagnosis present

## 2013-08-17 DIAGNOSIS — K219 Gastro-esophageal reflux disease without esophagitis: Secondary | ICD-10-CM | POA: Diagnosis present

## 2013-08-17 DIAGNOSIS — F3289 Other specified depressive episodes: Secondary | ICD-10-CM | POA: Diagnosis present

## 2013-08-17 DIAGNOSIS — B1910 Unspecified viral hepatitis B without hepatic coma: Secondary | ICD-10-CM | POA: Diagnosis present

## 2013-08-17 DIAGNOSIS — M1610 Unilateral primary osteoarthritis, unspecified hip: Secondary | ICD-10-CM | POA: Diagnosis present

## 2013-08-17 DIAGNOSIS — J449 Chronic obstructive pulmonary disease, unspecified: Secondary | ICD-10-CM | POA: Diagnosis present

## 2013-08-17 DIAGNOSIS — Z8249 Family history of ischemic heart disease and other diseases of the circulatory system: Secondary | ICD-10-CM

## 2013-08-17 LAB — RAPID URINE DRUG SCREEN, HOSP PERFORMED
Amphetamines: NOT DETECTED
BARBITURATES: NOT DETECTED
Benzodiazepines: POSITIVE — AB
Cocaine: NOT DETECTED
Opiates: POSITIVE — AB
Tetrahydrocannabinol: NOT DETECTED

## 2013-08-17 LAB — COMPREHENSIVE METABOLIC PANEL
ALK PHOS: 62 U/L (ref 39–117)
ALT: 10 U/L (ref 0–53)
AST: 27 U/L (ref 0–37)
Albumin: 3.6 g/dL (ref 3.5–5.2)
BUN: 18 mg/dL (ref 6–23)
CHLORIDE: 101 meq/L (ref 96–112)
CO2: 25 mEq/L (ref 19–32)
Calcium: 9 mg/dL (ref 8.4–10.5)
Creatinine, Ser: 0.9 mg/dL (ref 0.50–1.35)
GFR calc non Af Amer: >90 mL/min (ref >90)
GLUCOSE: 136 mg/dL — AB (ref 70–99)
POTASSIUM: 3.6 meq/L — AB (ref 3.7–5.3)
Sodium: 138 mEq/L (ref 137–147)
TOTAL PROTEIN: 7.3 g/dL (ref 6.0–8.3)
Total Bilirubin: 1.1 mg/dL (ref 0.3–1.2)

## 2013-08-17 LAB — CBC WITH DIFFERENTIAL/PLATELET
Basophils Absolute: 0 10*3/uL (ref 0.0–0.1)
Basophils Relative: 0 % (ref 0–1)
Eosinophils Absolute: 0 10*3/uL (ref 0.0–0.7)
Eosinophils Relative: 0 % (ref 0–5)
HCT: 41.8 % (ref 39.0–52.0)
HEMOGLOBIN: 13.5 g/dL (ref 13.0–17.0)
LYMPHS ABS: 0.6 10*3/uL — AB (ref 0.7–4.0)
LYMPHS PCT: 5 % — AB (ref 12–46)
MCH: 30.5 pg (ref 26.0–34.0)
MCHC: 32.3 g/dL (ref 30.0–36.0)
MCV: 94.4 fL (ref 78.0–100.0)
MONOS PCT: 6 % (ref 3–12)
Monocytes Absolute: 0.7 10*3/uL (ref 0.1–1.0)
NEUTROS ABS: 10.6 10*3/uL — AB (ref 1.7–7.7)
NEUTROS PCT: 89 % — AB (ref 43–77)
PLATELETS: 135 10*3/uL — AB (ref 150–400)
RBC: 4.43 MIL/uL (ref 4.22–5.81)
RDW: 13.4 % (ref 11.5–15.5)
WBC: 11.9 10*3/uL — AB (ref 4.0–10.5)

## 2013-08-17 LAB — INFLUENZA PANEL BY PCR (TYPE A & B)
H1N1 flu by pcr: NOT DETECTED
Influenza A By PCR: NEGATIVE
Influenza B By PCR: NEGATIVE

## 2013-08-17 MED ORDER — QUETIAPINE FUMARATE 100 MG PO TABS
300.0000 mg | ORAL_TABLET | Freq: Every day | ORAL | Status: DC
  Administered 2013-08-18: 300 mg via ORAL
  Filled 2013-08-17 (×2): qty 3

## 2013-08-17 MED ORDER — VANCOMYCIN HCL IN DEXTROSE 1-5 GM/200ML-% IV SOLN
1000.0000 mg | Freq: Three times a day (TID) | INTRAVENOUS | Status: DC
  Administered 2013-08-18 – 2013-08-19 (×4): 1000 mg via INTRAVENOUS
  Filled 2013-08-17 (×5): qty 200

## 2013-08-17 MED ORDER — CYCLOBENZAPRINE HCL 10 MG PO TABS: 10.0000 mg | ORAL_TABLET | Freq: Three times a day (TID) | ORAL | Status: DC | PRN

## 2013-08-17 MED ORDER — SODIUM CHLORIDE 0.9 % IV SOLN: INTRAVENOUS | Status: AC

## 2013-08-17 MED ORDER — QUETIAPINE FUMARATE 100 MG PO TABS
300.0000 mg | ORAL_TABLET | Freq: Once | ORAL | Status: AC
  Administered 2013-08-18: 300 mg via ORAL
  Filled 2013-08-17: qty 3

## 2013-08-17 MED ORDER — CLONAZEPAM 0.5 MG PO TABS
1.0000 mg | ORAL_TABLET | Freq: Four times a day (QID) | ORAL | Status: DC | PRN
  Administered 2013-08-18 (×2): 1 mg via ORAL
  Filled 2013-08-17 (×2): qty 2

## 2013-08-17 MED ORDER — SODIUM CHLORIDE 0.9 % IV BOLUS (SEPSIS)
1000.0000 mL | Freq: Once | INTRAVENOUS | Status: AC
  Administered 2013-08-17: 1000 mL via INTRAVENOUS

## 2013-08-17 MED ORDER — DEXTROSE 5 % IV SOLN: 1.0000 g | Freq: Three times a day (TID) | INTRAVENOUS | Status: DC

## 2013-08-17 MED ORDER — TIOTROPIUM BROMIDE MONOHYDRATE 18 MCG IN CAPS
18.0000 ug | ORAL_CAPSULE | Freq: Every day | RESPIRATORY_TRACT | Status: DC
  Administered 2013-08-18 – 2013-08-19 (×2): 18 ug via RESPIRATORY_TRACT
  Filled 2013-08-17: qty 5

## 2013-08-17 MED ORDER — MORPHINE SULFATE 4 MG/ML IJ SOLN
4.0000 mg | Freq: Once | INTRAMUSCULAR | Status: AC
  Administered 2013-08-17: 4 mg via INTRAVENOUS
  Filled 2013-08-17: qty 1

## 2013-08-17 MED ORDER — VANCOMYCIN HCL 10 G IV SOLR
2000.0000 mg | Freq: Once | INTRAVENOUS | Status: AC
  Administered 2013-08-17: 2000 mg via INTRAVENOUS
  Filled 2013-08-17: qty 2000

## 2013-08-17 MED ORDER — ONDANSETRON HCL 4 MG/2ML IJ SOLN
4.0000 mg | Freq: Once | INTRAMUSCULAR | Status: AC
  Administered 2013-08-17: 4 mg via INTRAVENOUS
  Filled 2013-08-17: qty 2

## 2013-08-17 MED ORDER — POTASSIUM CHLORIDE CRYS ER 20 MEQ PO TBCR
40.0000 meq | EXTENDED_RELEASE_TABLET | Freq: Once | ORAL | Status: AC
  Administered 2013-08-17: 40 meq via ORAL
  Filled 2013-08-17: qty 2

## 2013-08-17 MED ORDER — PANTOPRAZOLE SODIUM 40 MG PO TBEC
40.0000 mg | DELAYED_RELEASE_TABLET | Freq: Every day | ORAL | Status: DC
  Administered 2013-08-17 – 2013-08-19 (×3): 40 mg via ORAL
  Filled 2013-08-17 (×3): qty 1

## 2013-08-17 MED ORDER — CITALOPRAM HYDROBROMIDE 20 MG PO TABS
40.0000 mg | ORAL_TABLET | Freq: Every day | ORAL | Status: DC
  Administered 2013-08-17 – 2013-08-19 (×3): 40 mg via ORAL
  Filled 2013-08-17 (×3): qty 2

## 2013-08-17 MED ORDER — VITAMIN E 180 MG (400 UNIT) PO CAPS
400.0000 [IU] | ORAL_CAPSULE | Freq: Every day | ORAL | Status: DC
  Administered 2013-08-17 – 2013-08-19 (×3): 400 [IU] via ORAL
  Filled 2013-08-17 (×6): qty 1

## 2013-08-17 MED ORDER — TRAMADOL-ACETAMINOPHEN 37.5-325 MG PO TABS
2.0000 | ORAL_TABLET | Freq: Four times a day (QID) | ORAL | Status: DC | PRN
  Administered 2013-08-18: 2 via ORAL
  Filled 2013-08-17: qty 2

## 2013-08-17 MED ORDER — IPRATROPIUM-ALBUTEROL 0.5-2.5 (3) MG/3ML IN SOLN: 3.0000 mL | RESPIRATORY_TRACT | Status: DC | PRN

## 2013-08-17 MED ORDER — GABAPENTIN 300 MG PO CAPS
600.0000 mg | ORAL_CAPSULE | Freq: Three times a day (TID) | ORAL | Status: DC
  Administered 2013-08-17 – 2013-08-19 (×6): 600 mg via ORAL
  Filled 2013-08-17 (×6): qty 2

## 2013-08-17 MED ORDER — OXYCODONE-ACETAMINOPHEN 5-325 MG PO TABS
1.0000 | ORAL_TABLET | Freq: Four times a day (QID) | ORAL | Status: DC | PRN
  Administered 2013-08-17 – 2013-08-19 (×4): 1 via ORAL
  Filled 2013-08-17 (×5): qty 1

## 2013-08-17 MED ORDER — DEXTROSE 5 % IV SOLN
2.0000 g | Freq: Once | INTRAVENOUS | Status: AC
  Administered 2013-08-17: 2 g via INTRAVENOUS

## 2013-08-17 MED ORDER — ASPIRIN EC 81 MG PO TBEC
81.0000 mg | DELAYED_RELEASE_TABLET | Freq: Every day | ORAL | Status: DC
  Administered 2013-08-17 – 2013-08-19 (×3): 81 mg via ORAL
  Filled 2013-08-17 (×3): qty 1

## 2013-08-17 MED ORDER — NICOTINE 21 MG/24HR TD PT24
21.0000 mg | MEDICATED_PATCH | Freq: Every day | TRANSDERMAL | Status: DC
  Administered 2013-08-17 – 2013-08-19 (×3): 21 mg via TRANSDERMAL
  Filled 2013-08-17 (×3): qty 1

## 2013-08-17 MED ORDER — RISPERIDONE 1 MG PO TABS
2.0000 mg | ORAL_TABLET | Freq: Every day | ORAL | Status: DC
  Administered 2013-08-17 – 2013-08-18 (×2): 2 mg via ORAL
  Filled 2013-08-17 (×2): qty 2

## 2013-08-17 MED ORDER — GUAIFENESIN ER 600 MG PO TB12
600.0000 mg | ORAL_TABLET | Freq: Two times a day (BID) | ORAL | Status: DC
  Administered 2013-08-17 – 2013-08-19 (×4): 600 mg via ORAL
  Filled 2013-08-17 (×5): qty 1

## 2013-08-17 MED ORDER — KETOROLAC TROMETHAMINE 30 MG/ML IJ SOLN
15.0000 mg | Freq: Once | INTRAMUSCULAR | Status: AC
  Administered 2013-08-17: 15 mg via INTRAVENOUS
  Filled 2013-08-17: qty 1

## 2013-08-17 MED ORDER — DEXTROSE 5 % IV SOLN
1.0000 g | Freq: Three times a day (TID) | INTRAVENOUS | Status: DC
  Administered 2013-08-17 – 2013-08-19 (×6): 1 g via INTRAVENOUS
  Filled 2013-08-17 (×15): qty 1

## 2013-08-17 MED ORDER — ENOXAPARIN SODIUM 40 MG/0.4ML ~~LOC~~ SOLN: 40.0000 mg | SUBCUTANEOUS | Status: DC

## 2013-08-17 MED ORDER — ALBUTEROL SULFATE (2.5 MG/3ML) 0.083% IN NEBU: 3.0000 mL | INHALATION_SOLUTION | RESPIRATORY_TRACT | Status: DC | PRN

## 2013-08-17 MED ORDER — SODIUM CHLORIDE 0.9 % IV SOLN
INTRAVENOUS | Status: DC
  Administered 2013-08-17 – 2013-08-18 (×2): via INTRAVENOUS

## 2013-08-17 MED ORDER — RIVAROXABAN 20 MG PO TABS
20.0000 mg | ORAL_TABLET | Freq: Every day | ORAL | Status: DC
  Administered 2013-08-17 – 2013-08-18 (×2): 20 mg via ORAL
  Filled 2013-08-17 (×2): qty 1

## 2013-08-17 MED ORDER — VITAMIN D 1000 UNITS PO TABS
2000.0000 [IU] | ORAL_TABLET | Freq: Every day | ORAL | Status: DC
  Administered 2013-08-17 – 2013-08-19 (×3): 2000 [IU] via ORAL
  Filled 2013-08-17 (×3): qty 2

## 2013-08-17 NOTE — Progress Notes (Signed)
ANTIBIOTIC CONSULT NOTE - INITIAL  Pharmacy Consult for Vancomycin and Cefepime Indication: pneumonia  No Known Allergies  Patient Measurements: Height: 6\' 4"  (193 cm) Weight: 220 lb (99.791 kg) IBW/kg (Calculated) : 86.8  Vital Signs: Temp: 98.7 F (37.1 C) (05/06 1241) Temp src: Oral (05/06 1241) BP: 122/66 mmHg (05/06 1241) Pulse Rate: 101 (05/06 1241) Intake/Output from previous day:   Intake/Output from this shift:    Labs:  Recent Labs  08/17/13 1310  WBC 11.9*  HGB 13.5  PLT 135*  CREATININE 0.90   Estimated Creatinine Clearance: 119.2 ml/min (by C-G formula based on Cr of 0.9). No results found for this basename: VANCOTROUGH, VANCOPEAK, VANCORANDOM, GENTTROUGH, GENTPEAK, GENTRANDOM, TOBRATROUGH, TOBRAPEAK, TOBRARND, AMIKACINPEAK, AMIKACINTROU, AMIKACIN,  in the last 72 hours   Microbiology: Recent Results (from the past 720 hour(s))  CULTURE, BLOOD (ROUTINE X 2)     Status: None   Collection Time    08/01/13  9:42 AM      Result Value Ref Range Status   Specimen Description BLOOD RIGHT ANTECUBITAL DRAWN BY RN   Final   Special Requests BOTTLES DRAWN AEROBIC AND ANAEROBIC 5CC   Final   Culture NO GROWTH 5 DAYS   Final   Report Status 08/06/2013 FINAL   Final  CULTURE, BLOOD (ROUTINE X 2)     Status: None   Collection Time    08/01/13 11:03 AM      Result Value Ref Range Status   Specimen Description BLOOD LEFT ANTECUBITAL   Final   Special Requests BOTTLES DRAWN AEROBIC AND ANAEROBIC 8CC   Final   Culture NO GROWTH 5 DAYS   Final   Report Status 08/06/2013 FINAL   Final   Medical History: Past Medical History  Diagnosis Date  . PE (pulmonary embolism)   . Degenerative disk disease   . Arthritis   . Family history of anesthesia complication     mother has N/V  . Anxiety   . GERD (gastroesophageal reflux disease)   . Headache(784.0)   . Hepatitis     Hep B & C  . Pneumonia     has recurrent pneumonia, last ~1 year ago  . Asthma     has not  had an attack in over a year   Medications:  Scheduled:  .  morphine injection  4 mg Intravenous Once  . ondansetron (ZOFRAN) IV  4 mg Intravenous Once   Assessment: 53yo male admitted with suspected pneumonia.  Pt has large body habitus and good renal fxn.  Estimated Creatinine Clearance: 119.2 ml/min (by C-G formula based on Cr of 0.9).  Vancomycin 5/6 >> Cefepime 5/6 >>  Goal of Therapy:  Vancomycin trough level 15-20 mcg/ml  Plan:  Vancomycin 2000mg  IV now x 1 (loading dose) then Vancomycin 1000mg  IV q8hrs Check trough at steady state Cefepime 2gm x 1 initially then 1gm IV q8h Monitor labs, renal fxn, and cultures  Kyrillos Adams A Dora Clauss 08/17/2013,2:40 PM

## 2013-08-17 NOTE — ED Provider Notes (Signed)
CSN: [id number]     Arrival date & time 08/17/13  1238 History   First MD Initiated Contact with Patient 08/17/13 1250     Chief Complaint  Patient presents with  . Fever     (Consider location/radiation/quality/duration/timing/severity/associated sxs/prior Treatment) HPI.....Marland Kitchen fever, cough since last night with associated right hip pain. Patient was recently treated for community-acquired pneumonia with an oral antibiotic. He is immunocompromised with hepatitis B and C. Severity is moderate. Exertion makes symptoms worse. No anterior chest pain  Past Medical History  Diagnosis Date  . PE (pulmonary embolism)   . Degenerative disk disease   . Arthritis   . Family history of anesthesia complication     mother has N/V  . Anxiety   . GERD (gastroesophageal reflux disease)   . Headache(784.0)   . Hepatitis     Hep B & C  . Pneumonia     has recurrent pneumonia, last ~1 year ago  . Asthma     has not had an attack in over a year   Past Surgical History  Procedure Laterality Date  . Cholecystectomy    . Hernia repair      umbilical  . Closed reduction mandible with mandibuloma  10/21/2011    Procedure: CLOSED REDUCTION MANDIBLE WITH MANDIBULOMAXILLARY FUSION;  Surgeon: Darletta Moll, MD;  Location: [facility] OR;  Service: ENT;  Laterality: N/A;  MMF screws  . Orif mandibular fracture  10/21/2011    Procedure: OPEN REDUCTION INTERNAL FIXATION (ORIF) MANDIBULAR FRACTURE;  Surgeon: Darletta Moll, MD;  Location: [facility] OR;  Service: ENT;  Laterality: N/A;  . Hand surgery  20 years ago    right  . Mandibular hardware removal  11/11/2011    Procedure: MANDIBULAR HARDWARE REMOVAL;  Surgeon: Darletta Moll, MD;  Location: [facility];  Service: ENT;  Laterality: N/A;  . Leg surgery     Family History  Problem Relation Age of Onset  . Coronary artery disease Mother   . Coronary artery disease Father    History  Substance Use Topics  . Smoking status: Current Every Day Smoker -- 0.50 packs/day     Types: Cigarettes  . Smokeless tobacco: Not on file     Comment: a few cigarettes a day  . Alcohol Use: No    Review of Systems  All other systems reviewed and are negative.     Allergies  Review of patient's allergies indicates no known allergies.  Home Medications   Prior to Admission medications   Medication Sig Start Date End Date Taking? Authorizing Provider  albuterol (PROVENTIL HFA;VENTOLIN HFA) 108 (90 BASE) MCG/ACT inhaler Inhale 2 puffs into the lungs every 4 (four) hours as needed. FOR SHORTNESS OF BREATH   Yes Historical Provider, MD  aspirin EC 81 MG tablet Take 81 mg by mouth daily.   Yes Historical Provider, MD  Cholecalciferol (VITAMIN D) 2000 UNITS tablet Take 2,000 Units by mouth daily.    Yes Historical Provider, MD  citalopram (CELEXA) 40 MG tablet Take 40 mg by mouth daily.   Yes Historical Provider, MD  clonazePAM (KLONOPIN) 1 MG tablet Take 1 mg by mouth 4 (four) times daily.   Yes Historical Provider, MD  cyclobenzaprine (FLEXERIL) 10 MG tablet Take 1 tablet by mouth 3 (three) times daily as needed. 08/10/13  Yes Historical Provider, MD  esomeprazole (NEXIUM) 40 MG capsule Take 40 mg by mouth.   Yes Historical Provider, MD  gabapentin (NEURONTIN) 600 MG tablet Take 600  mg by mouth 3 (three) times daily.   Yes Historical Provider, MD  MILK THISTLE EXTRACT PO Take 3,000 mg by mouth 2 (two) times daily. 1000 mg capsules 3 times daily   Yes Historical Provider, MD  ondansetron (ZOFRAN ODT) 4 MG disintegrating tablet 4mg  ODT q6 hours prn nausea/vomit 10/26/12  Yes Benny LennertJoseph L Zammit, MD  Oxycodone HCl 10 MG TABS Take 1 tablet (10 mg total) by mouth 4 (four) times daily as needed. 12/12/11  Yes Carleene CooperAlan Davidson III, MD  oxyCODONE-acetaminophen (PERCOCET/ROXICET) 5-325 MG per tablet Take 1 tablet by mouth every 6 (six) hours as needed for severe pain. 08/01/13  Yes Benny LennertJoseph L Zammit, MD  pantoprazole (PROTONIX) 40 MG tablet Take 40 mg by mouth daily.   Yes Historical Provider,  MD  QUEtiapine (SEROQUEL) 300 MG tablet Take 1 tablet by mouth daily. 08/04/13  Yes Historical Provider, MD  risperiDONE (RISPERDAL) 2 MG tablet Take 2 mg by mouth at bedtime.   Yes Historical Provider, MD  Rivaroxaban (XARELTO) 20 MG TABS Take 20 mg by mouth daily.   Yes Historical Provider, MD  tiotropium (SPIRIVA) 18 MCG inhalation capsule Place 18 mcg into inhaler and inhale.   Yes Historical Provider, MD  vitamin E (VITAMIN E) 400 UNIT capsule Take 400 Units by mouth daily.    Yes Historical Provider, MD   BP 122/66  Pulse 101  Temp(Src) 98.7 F (37.1 C) (Oral)  Resp 18  Ht 6\' 4"  (1.93 m)  Wt 220 lb (99.791 kg)  BMI 26.79 kg/m2  SpO2 92% Physical Exam  Nursing note and vitals reviewed. Constitutional: He is oriented to person, place, and time.  Pale, slightly tachypneic  HENT:  Head: Normocephalic and atraumatic.  Eyes: Conjunctivae and EOM are normal. Pupils are equal, round, and reactive to light.  Neck: Normal range of motion. Neck supple.  Cardiovascular: Normal rate, regular rhythm and normal heart sounds.   Pulmonary/Chest: Effort normal.  Scattered rhonchi on right   Abdominal: Soft. Bowel sounds are normal.  Musculoskeletal: Normal range of motion.  Neurological: He is alert and oriented to person, place, and time.  Skin: Skin is warm and dry.  Psychiatric: He has a normal mood and affect. His behavior is normal.    ED Course  Procedures (including critical care time) Labs Review Labs Reviewed  CBC WITH DIFFERENTIAL - Abnormal; Notable for the following:    WBC 11.9 (*)    Platelets 135 (*)    Neutrophils Relative % 89 (*)    Neutro Abs 10.6 (*)    Lymphocytes Relative 5 (*)    Lymphs Abs 0.6 (*)    All other components within normal limits  COMPREHENSIVE METABOLIC PANEL - Abnormal; Notable for the following:    Potassium 3.6 (*)    Glucose, Bld 136 (*)    All other components within normal limits  CULTURE, BLOOD (ROUTINE X 2)  CULTURE, BLOOD (ROUTINE X  2)    Imaging Review Dg Chest 1 View  08/17/2013   CLINICAL DATA:  Move fever and weakness.  Right hip pain.  EXAM: CHEST - 1 VIEW  COMPARISON:  CT chest 08/01/2013. PA and lateral chest 08/01/2013 and 12/28/2011.  FINDINGS: There is extensive airspace disease in the right mid and lower lung zones. Left basilar airspace disease seen on the most recent exams has cleared. No pneumothorax or pleural effusion. Heart size is normal.  IMPRESSION: Right mid and lower lung zone airspace disease most consistent with pneumonia.  Left lower lobe  airspace disease seen on the most recent studies has cleared.   Electronically Signed   By: Drusilla Kannerhomas  Dalessio M.D.   On: 08/17/2013 14:09   Dg Hip Complete Right  08/17/2013   CLINICAL DATA:  Right hip pain.  EXAM: RIGHT HIP - COMPLETE 2+ VIEW  COMPARISON:  None.  FINDINGS: There is no evidence of hip fracture or dislocation. Mild osteoarthritis present. No bony lesions or destruction. Soft tissues are unremarkable.  IMPRESSION: Mild osteoarthritis of the right hip.   Electronically Signed   By: Irish LackGlenn  Yamagata M.D.   On: 08/17/2013 14:09     EKG Interpretation None      MDM   Final diagnoses:  Healthcare-associated pneumonia    Patient had a second bout of pneumonia in the past couple weeks. Rx IV vancomycin, IV Maxipime. Admit to general medicine    Donnetta HutchingBrian Anothy Bufano, MD 08/17/13 [phone]

## 2013-08-17 NOTE — ED Notes (Signed)
Pt dozing off at triage

## 2013-08-17 NOTE — ED Notes (Signed)
Pt states he had a fever last night. Complain of pain in right hip started last night also. States has arthritis

## 2013-08-17 NOTE — H&P (Addendum)
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 Patient reports becoming completely from recent pneumonia and completing his antibiotics. He suddenly developed high-grade fever with chills and sweating last night. He reports chronic cough since yesterday this has been more pronounced and nonproductive. He had headache and  generalized bodyaches. Also reports having runny nose. His girlfriend checked his temperature and it was 104 degree Fahrenheit. Patient also reports pain in his right hip and difficulty walking since yesterday. He is not sure if he had a fall. Denies any loss of consciousness. Patient denies nausea , vomiting, chest pain, palpitations, , abdominal pain, bowel or urinary symptoms. Denies change in weight or appetite.   Course in the ED patient had low-grade temperature of 58F, tachycardic to 101, O2 sat in low 90s on room air . Blood work done showed a WBC of 11.9 thousand with normal hemoglobin and platelets. Sodium of 135 and potassium was 2.6. But was was 136. Chest x-ray showed mild pneumonia involving right middle and lower lobe. X-ray of the right hip showed mild arthritic changes  Review of Systems:  Constitutional:  fever, chills, diaphoresis, appetite change and fatigue.  HEENT: Cough and rhinorrhea, Denies photophobia, eye pain, redness,  hearing loss, ear pain,  sore throat,  sneezing, mouth sores, trouble swallowing, neck pain,  Respiratory: SOB, DOE, cough, denies chest tightness,  and wheezing.   Cardiovascular: Denies chest pain, palpitations and leg swelling.  Gastrointestinal: Denies nausea, vomiting, abdominal pain, diarrhea, constipation, blood in stool and abdominal distention.  Genitourinary: Denies dysuria, urgency, frequency, hematuria, flank pain and difficulty urinating.  Endocrine: Denies hot or cold intolerance, polyuria, polydipsia. Musculoskeletal: Myalgias, chronic back pain, pain in right right hip.  Denies joint swelling,  Skin: Denies pallor, rash and wound.  Neurological: Denies dizziness, seizures, syncope, weakness, light-headedness, numbness .  Headaches+.  Psychiatric/Behavioral: Denies  confusion, nervousness, sleep disturbance and agitation   Past Medical History  Diagnosis Date  . PE (pulmonary embolism)   . Degenerative disk disease   . Arthritis   . Family history of anesthesia complication     mother has N/V  . Anxiety   . GERD (gastroesophageal reflux disease)   . Headache(784.0)   . Hepatitis     Hep B & C  . Pneumonia     has recurrent pneumonia, last ~1 year ago  . Asthma     has not had an attack in over a year   Past Surgical History  Procedure Laterality Date  . Cholecystectomy    . Hernia repair      umbilical  . Closed reduction mandible with mandibuloma  10/21/2011    Procedure: CLOSED REDUCTION MANDIBLE WITH MANDIBULOMAXILLARY FUSION;  Surgeon: Darletta MollSui W Teoh, MD;  Location: [facility] OR;  Service: ENT;  Laterality: N/A;  MMF screws  . Orif mandibular fracture  10/21/2011    Procedure: OPEN REDUCTION INTERNAL FIXATION (ORIF) MANDIBULAR FRACTURE;  Surgeon: Darletta Moll, MD;  Location: [facility] OR;  Service: ENT;  Laterality: N/A;  . Hand surgery  20 years ago    right  . Mandibular hardware removal  11/11/2011    Procedure: MANDIBULAR HARDWARE REMOVAL;  Surgeon: Darletta Moll, MD;  Location:  [facility];  Service: ENT;  Laterality: N/A;  . Leg surgery     Social History:  reports that he has been smoking Cigarettes.  He has been smoking about 0.50 packs per day. He does not have any smokeless tobacco history on file. He reports that he does not drink alcohol or use illicit drugs.  No Known Allergies  Family History  Problem Relation Age of Onset  . Coronary artery disease Mother   . Coronary artery disease Father     Prior to Admission medications   Medication Sig Start Date End Date Taking? Authorizing Provider  albuterol (PROVENTIL HFA;VENTOLIN HFA) 108 (90 BASE) MCG/ACT inhaler Inhale 2 puffs into the lungs every 4 (four) hours as needed. FOR SHORTNESS OF BREATH   Yes Historical Provider, MD  aspirin EC 81 MG tablet Take 81 mg by mouth daily.   Yes Historical Provider, MD  Cholecalciferol (VITAMIN D) 2000 UNITS tablet Take 2,000 Units by mouth daily.    Yes Historical Provider, MD  citalopram (CELEXA) 40 MG tablet Take 40 mg by mouth daily.   Yes Historical Provider, MD  clonazePAM (KLONOPIN) 1 MG tablet Take 1 mg by mouth 4 (four) times daily.   Yes Historical Provider, MD  cyclobenzaprine (FLEXERIL) 10 MG tablet Take 1 tablet by mouth 3 (three) times daily as needed. 08/10/13  Yes Historical Provider, MD  esomeprazole (NEXIUM) 40 MG capsule Take 40 mg by mouth.   Yes Historical Provider, MD  gabapentin (NEURONTIN) 600 MG tablet Take 600 mg by mouth 3 (three) times daily.   Yes Historical Provider, MD  MILK THISTLE EXTRACT PO Take 3,000 mg by mouth 2 (two) times daily. 1000 mg capsules 3 times daily   Yes Historical Provider, MD  ondansetron (ZOFRAN ODT) 4 MG disintegrating tablet 4mg  ODT q6 hours prn nausea/vomit 10/26/12  Yes Benny Lennert, MD  Oxycodone HCl 10 MG TABS Take 1 tablet (10 mg total) by mouth 4 (four) times daily as needed. 12/12/11  Yes Carleene Cooper III, MD  oxyCODONE-acetaminophen (PERCOCET/ROXICET) 5-325 MG per tablet Take 1 tablet by mouth  every 6 (six) hours as needed for severe pain. 08/01/13  Yes Benny Lennert, MD  pantoprazole (PROTONIX) 40 MG tablet Take 40 mg by mouth daily.   Yes Historical Provider, MD  QUEtiapine (SEROQUEL) 300 MG tablet Take 1 tablet by mouth daily. 08/04/13  Yes Historical Provider, MD  risperiDONE (RISPERDAL) 2 MG tablet Take 2 mg by mouth at bedtime.   Yes Historical Provider, MD  Rivaroxaban (XARELTO) 20 MG TABS Take 20 mg by mouth daily.   Yes Historical Provider, MD  tiotropium (SPIRIVA) 18 MCG inhalation capsule Place 18 mcg into inhaler and inhale.   Yes Historical Provider, MD  vitamin E (VITAMIN E) 400 UNIT capsule Take 400 Units by mouth daily.    Yes Historical Provider, MD     Physical Exam:  Filed Vitals:   08/17/13 1241 08/17/13 1532  BP: 122/66   Pulse: 101 89  Temp: 98.7 F (37.1 C)   TempSrc: Oral   Resp: 18   Height: 6\' 4"  (1.93 m)  Weight: 99.791 kg (220 lb)   SpO2: 92% 93%    Constitutional: Vital signs reviewed.  Patient is a well-developed and well-nourished in no acute distress. HEENT: no pallor, no icterus, moist oral mucosa, no cervical lymphadenopathy Cardiovascular: RRR, S1 normal, S2 normal, no MRG Chest: coarse crackles over right lung. No rhonchi or wheeze Abdominal: Soft. Non-tender, non-distended, bowel sounds are normal, no masses, organomegaly, or guarding present.  GU: no CVA tenderness Ext: warm, no edema. Normal SLTR b/l, no leg swelling or deformity. Tender ROM of right hip. Neurological: A&O x3, non focal  Labs on Admission:  Basic Metabolic Panel:  Recent Labs Lab 08/17/13 1310  NA 138  K 3.6*  CL 101  CO2 25  GLUCOSE 136*  BUN 18  CREATININE 0.90  CALCIUM 9.0   Liver Function Tests:  Recent Labs Lab 08/17/13 1310  AST 27  ALT 10  ALKPHOS 62  BILITOT 1.1  PROT 7.3  ALBUMIN 3.6   No results found for this basename: LIPASE, AMYLASE,  in the last 168 hours No results found for this basename: AMMONIA,  in the last 168  hours CBC:  Recent Labs Lab 08/17/13 1310  WBC 11.9*  NEUTROABS 10.6*  HGB 13.5  HCT 41.8  MCV 94.4  PLT 135*   Cardiac Enzymes: No results found for this basename: CKTOTAL, CKMB, CKMBINDEX, TROPONINI,  in the last 168 hours BNP: No components found with this basename: POCBNP,  CBG: No results found for this basename: GLUCAP,  in the last 168 hours  Radiological Exams on Admission: Dg Chest 1 View  08/17/2013   CLINICAL DATA:  Move fever and weakness.  Right hip pain.  EXAM: CHEST - 1 VIEW  COMPARISON:  CT chest 08/01/2013. PA and lateral chest 08/01/2013 and 12/28/2011.  FINDINGS: There is extensive airspace disease in the right mid and lower lung zones. Left basilar airspace disease seen on the most recent exams has cleared. No pneumothorax or pleural effusion. Heart size is normal.  IMPRESSION: Right mid and lower lung zone airspace disease most consistent with pneumonia.  Left lower lobe airspace disease seen on the most recent studies has cleared.   Electronically Signed   By: Drusilla Kannerhomas  Dalessio M.D.   On: 08/17/2013 14:09   Dg Hip Complete Right  08/17/2013   CLINICAL DATA:  Right hip pain.  EXAM: RIGHT HIP - COMPLETE 2+ VIEW  COMPARISON:  None.  FINDINGS: There is no evidence of hip fracture or dislocation. Mild osteoarthritis present. No bony lesions or destruction. Soft tissues are unremarkable.  IMPRESSION: Mild osteoarthritis of the right hip.   Electronically Signed   By: Irish LackGlenn  Yamagata M.D.   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 -start empiric vancomycin and cefepime. dosing per pharmacy -Follow blood cx.  urine for strep and legionella.  -02 via [place] prn. Continue albuterol and Atrovent nebs prn counseled on smoking cessation. Possibly has some underlying COPD. Also has had recurrent pneumonias in past.  Recent CT angiogram fo the chest to  r/o PE shows 6 mm nodule which needs follow up CT chest in 6-12 months given risk for bronchogenic ca. - swallow eval given recurrent PNAs. Supportive care with tylenol, antitussives and IV fluids Check flu PCR and UDS   GERD  continue PPI  Anxiety and ? Depression  patient on klonipin, Risperdal and Seroquel which i will continue  Chronic back and hip pains Resume neurontin  And flexeril. Prn tramadol for right hip pain.   Right hip pain No acute findings on xray of the hip. Patient has pain on ROM of right hip. Has underlying mild osteoarthritis of the hip on x ray. Prn tramadol for pain. Will obtain CT of the hip if symptoms not improved.   Hep B &C  reports getting hepatitis from tattoos and reports getting treated at [facility]  Hx of PE  on xarelto  COPD  resume albuterol inhaler and  spiriva  Tobacco abuse  counseled strongly on cessation. Will provide nicotine patch   Diet:regular  DVT prophylaxis: sq lovenox   Code Status: full code Family Communication: girlfriend at bedside Disposition Plan: home once improved  Shanina Kepple [facility] Pager [phone]  Total time spent on admission :70 minutes  If 7PM-7AM, please contact night-coverage [email or web address] Password [facility] 08/17/2013, 3:37 PM

## 2013-08-18 DIAGNOSIS — J449 Chronic obstructive pulmonary disease, unspecified: Secondary | ICD-10-CM

## 2013-08-18 DIAGNOSIS — F411 Generalized anxiety disorder: Secondary | ICD-10-CM

## 2013-08-18 DIAGNOSIS — B1920 Unspecified viral hepatitis C without hepatic coma: Secondary | ICD-10-CM

## 2013-08-18 DIAGNOSIS — Z86711 Personal history of pulmonary embolism: Secondary | ICD-10-CM

## 2013-08-18 DIAGNOSIS — B1910 Unspecified viral hepatitis B without hepatic coma: Secondary | ICD-10-CM

## 2013-08-18 DIAGNOSIS — K219 Gastro-esophageal reflux disease without esophagitis: Secondary | ICD-10-CM

## 2013-08-18 LAB — HIV ANTIBODY (ROUTINE TESTING W REFLEX): HIV: NONREACTIVE

## 2013-08-18 LAB — LEGIONELLA ANTIGEN, URINE: Legionella Antigen, Urine: NEGATIVE

## 2013-08-18 LAB — STREP PNEUMONIAE URINARY ANTIGEN: STREP PNEUMO URINARY ANTIGEN: NEGATIVE

## 2013-08-18 MED ORDER — BUDESONIDE 0.25 MG/2ML IN SUSP
0.2500 mg | Freq: Two times a day (BID) | RESPIRATORY_TRACT | Status: DC
  Administered 2013-08-18 – 2013-08-19 (×2): 0.25 mg via RESPIRATORY_TRACT
  Filled 2013-08-18 (×8): qty 2

## 2013-08-18 NOTE — Progress Notes (Signed)
Utilization Review Complete  

## 2013-08-18 NOTE — Progress Notes (Signed)
[facility] PROGRESS NOTE  Eric FasterGary W Stewart ZOX:[id number] DOB: January 20, 1961 DOA: 08/17/2013 PCP: [name],[name], [name]  Assessment/Plan: 1-healthcare assess her pneumonia: -Will continue vancomycin and cefepime -Start Pulmicort -Repeat chest x-ray in the morning to follow pneumonia and shortness of breath -Continue as needed antipyretics and antitussives -Follow blood culture, urine antigen for strep and Legionella. -continue PRN duonebs and IS  2-GERD  continue PPI   3-Anxiety and Depression  patient on klonipin, Risperdal and Seroquel which will be continued  4-Chronic back and hip pains  Resume neurontin And flexeril. Prn tramadol for right hip pain.   Right hip pain  No acute findings on xray of the hip. Patient has pain on ROM of right hip. Has underlying mild osteoarthritis of the hip on x ray. Prn tramadol for pain. Will obtain CT of the hip if symptoms failed to improved.   Hep B &C  reports getting hepatitis from tattoos and reports getting treated at [facility]. -continue outpatient treatment  Hx of PE  -on xarelto  -no signs of overt bleeding  COPD  -resume albuterol inhaler as needed and spiriva. -wheezing on exam, will add pulmicort -also started on IS -encouraged to quit smoking at discharged.  Tobacco abuse  counseled strongly on cessation. Will continue nicotine patch   Code Status: Full Family Communication: wife at bedside Disposition Plan: Home when medically stable.   Consultants:  None  Procedures:  See below for x-ray results.  Antibiotics:  Vancomycin  Cefepime  HPI/Subjective: Afebrile, patient denies chest pain or significant shortness of breath. Positive expiratory wheezing and scattered rhonchi through theout lung fields bilaterally.  Objective: Filed Vitals:   08/18/13 1406  BP: 114/66  Pulse: 84  Temp: 98 F (36.7 C)  Resp: 18    Intake/Output Summary (Last 24 hours) at 08/18/13 1716 Last data filed at 08/18/13  1300  Gross per 24 hour  Intake   2320 ml  Output   1400 ml  Net    920 ml   Filed Weights   08/17/13 1241  Weight: 99.791 kg (220 lb)    Exam:   General:  Feeling better, no CP, afebrile  Cardiovascular: S1 and S2, no rubs or gallops  Respiratory: Slight expiratory wheeze in and scattered rhonchi bilaterally  Abdomen: soft, NT, ND, positive BS  Musculoskeletal: no edema or cyanosis   Data Reviewed: Basic Metabolic Panel:  Recent Labs Lab 08/17/13 1310  NA 138  K 3.6*  CL 101  CO2 25  GLUCOSE 136*  BUN 18  CREATININE 0.90  CALCIUM 9.0   Liver Function Tests:  Recent Labs Lab 08/17/13 1310  AST 27  ALT 10  ALKPHOS 62  BILITOT 1.1  PROT 7.3  ALBUMIN 3.6   CBC:  Recent Labs Lab 08/17/13 1310  WBC 11.9*  NEUTROABS 10.6*  HGB 13.5  HCT 41.8  MCV 94.4  PLT 135*    Recent Results (from the past 240 hour(s))  CULTURE, BLOOD (ROUTINE X 2)     Status: None   Collection Time    08/17/13  1:11 PM      Result Value Ref Range Status   Specimen Description BLOOD RIGHT ANTECUBITAL DRAWN BY RN ASP   Final   Special Requests BOTTLES DRAWN AEROBIC ONLY 7CC  IMMUNE:COMPROMISED   Final   Culture NO GROWTH 1 DAY   Final   Report Status PENDING   Incomplete  CULTURE, BLOOD (ROUTINE X 2)     Status: None   Collection Time  08/17/13  1:19 PM      Result Value Ref Range Status   Specimen Description BLOOD LEFT ANTECUBITAL   Final   Special Requests     Final   Value: BOTTLES DRAWN AEROBIC AND ANAEROBIC AEB=8CC ANA=6CC IMMUNE:COMPROMISED   Culture NO GROWTH 1 DAY   Final   Report Status PENDING   Incomplete     Studies: Dg Chest 1 View  08/17/2013   CLINICAL DATA:  Move fever and weakness.  Right hip pain.  EXAM: CHEST - 1 VIEW  COMPARISON:  CT chest 08/01/2013. PA and lateral chest 08/01/2013 and 12/28/2011.  FINDINGS: There is extensive airspace disease in the right mid and lower lung zones. Left basilar airspace disease seen on the most recent exams has  cleared. No pneumothorax or pleural effusion. Heart size is normal.  IMPRESSION: Right mid and lower lung zone airspace disease most consistent with pneumonia.  Left lower lobe airspace disease seen on the most recent studies has cleared.   Electronically Signed   By: Eric Kannerhomas  Dalessio M.D.   On: 08/17/2013 14:09   Dg Hip Complete Right  08/17/2013   CLINICAL DATA:  Right hip pain.  EXAM: RIGHT HIP - COMPLETE 2+ VIEW  COMPARISON:  None.  FINDINGS: There is no evidence of hip fracture or dislocation. Mild osteoarthritis present. No bony lesions or destruction. Soft tissues are unremarkable.  IMPRESSION: Mild osteoarthritis of the right hip.   Electronically Signed   By: Eric LackGlenn  Yamagata M.D.   On: 08/17/2013 14:09    Scheduled Meds: . aspirin EC  81 mg Oral Daily  . budesonide (PULMICORT) nebulizer solution  0.25 mg Nebulization BID  . ceFEPime (MAXIPIME) IV  1 g Intravenous 3 times per day  . cholecalciferol  2,000 Units Oral Daily  . citalopram  40 mg Oral Daily  . gabapentin  600 mg Oral TID  . guaiFENesin  600 mg Oral BID  . nicotine  21 mg Transdermal Daily  . pantoprazole  40 mg Oral Daily  . QUEtiapine  300 mg Oral Daily  . risperiDONE  2 mg Oral QHS  . rivaroxaban  20 mg Oral Q supper  . tiotropium  18 mcg Inhalation Daily  . vancomycin  1,000 mg Intravenous Q8H  . 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 If 7PM-7AM, please contact night-coverage at [email or web address], password [email or web address] 08/18/2013, 5:16 PM  LOS: 1 day

## 2013-08-18 NOTE — Progress Notes (Signed)
Notified by patient that he did not receive his dose of Seroquel today. Patient had Seroquel scheduled @1700  and did not receive it because he stated he wanted to take it at night. Notified pharmacy and pharmacy changed scheduled time to 2200. Patient received a dose of Seroquel @ 0004 per previous electronic order. Will continue to monitor patient.

## 2013-08-19 ENCOUNTER — Inpatient Hospital Stay (HOSPITAL_COMMUNITY): Payer: Medicare HMO

## 2013-08-19 LAB — VANCOMYCIN, TROUGH: Vancomycin Tr: 7.3 ug/mL — ABNORMAL LOW (ref 10.0–20.0)

## 2013-08-19 MED ORDER — GUAIFENESIN ER 600 MG PO TB12: 600.0000 mg | ORAL_TABLET | Freq: Two times a day (BID) | ORAL | Status: DC

## 2013-08-19 MED ORDER — AMOXICILLIN-POT CLAVULANATE 875-125 MG PO TABS: 1.0000 | ORAL_TABLET | Freq: Two times a day (BID) | ORAL | Status: AC

## 2013-08-19 MED ORDER — BUDESONIDE-FORMOTEROL FUMARATE 160-4.5 MCG/ACT IN AERO: 2.0000 | INHALATION_SPRAY | Freq: Two times a day (BID) | RESPIRATORY_TRACT | Status: DC

## 2013-08-19 MED ORDER — NICOTINE 21 MG/24HR TD PT24: 21.0000 mg | MEDICATED_PATCH | Freq: Every day | TRANSDERMAL | Status: DC

## 2013-08-19 MED ORDER — VANCOMYCIN HCL 10 G IV SOLR
2000.0000 mg | Freq: Two times a day (BID) | INTRAVENOUS | Status: DC
  Administered 2013-08-19: 2000 mg via INTRAVENOUS
  Filled 2013-08-19 (×7): qty 2000

## 2013-08-19 NOTE — Discharge Summary (Signed)
Physician Discharge Summary  [name] [id number] DOB: 09-20-1960 DOA: 08/17/2013  PCP: [name]  Admit date: 08/17/2013 Discharge date: 08/19/2013  Time spent: >30 minutes  Recommendations for Outpatient Follow-up:  1. BMET to follow electrolytes and renal function 2. Help with smoking cessation process 3. Recheck CXR in 4 weeks to ensure resolution of infiltrates 4. 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 Will discharge home and has been advise to be compliant with discharge instructions/recommendations and to follow with PCP in 7-10 days.   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 Patient reports becoming completely from recent pneumonia and completing his antibiotics. He suddenly developed high-grade fever with chills and sweating last night. He reports chronic cough since yesterday this has been more pronounced and nonproductive. He had headache and generalized bodyaches. Also reports having runny nose. His girlfriend checked his temperature and it was 104 degree Fahrenheit. Patient also  reports pain in his right hip and difficulty walking since yesterday. He is not sure if he had a fall. Denies any loss of consciousness.  Patient denies nausea , vomiting, chest pain, palpitations, , abdominal pain, bowel or urinary symptoms. Denies change in weight or appetite.  Course in the ED patient had low-grade temperature of 48F, tachycardic to 101, O2 sat in low 90s on room air . Blood work done showed a WBC of 11.9 thousand with normal hemoglobin and platelets. Sodium of 135 and potassium was 2.6. But was was 136. Chest x-ray showed mild pneumonia involving right middle and lower lobe. X-ray of the right hip showed mild arthritic changes   Hospital Course:  healthcare associated pneumonia:  -after 3 days total of IV antibiotics; will transition to augmentin DIB to complete 8 more days and finish antibiotic therapy -will discharge on symbicort, spiriva and PRN albuterol -advise to quit smoking -at discharge no SOB, no tachypnea and patient repeated CXR demonstrated improved aeration -Continue mucinex -neg blood cx's, legionella and strep urine antigen -continue IS   GERD  continue PPI   Anxiety and Depression  patient on klonipin, Risperdal and Seroquel which will be continued   Chronic back and hip pains  Resume neurontin And flexeril. Prn analgesics as taken prior to admission.   Right hip pain  No acute findings on xray of the hip. Patient has pain on ROM of right hip. Has underlying mild osteoarthritis of the hip on x ray. Continue PRN pain meds as crhonically he use as an outpatient.  Hep B &C  -reports getting hepatitis from tattoos and reports getting treated at [facility].  -continue outpatient  treatment   Hx of PE  -on xarelto  -no signs of overt bleeding   COPD  -resume albuterol inhaler as needed and spiriva.  -no wheezing at discharge -will add symbicort to be used BID along with spiriva for maintenance therapy -encouraged to quit smoking at  discharged.   Tobacco abuse  counseled strongly on cessation. Will continue nicotine patch    Procedures:  See below for x-ray reports   Consultations:  None   Discharge Exam: Filed Vitals:   08/19/13 0427  BP: 123/53  Pulse: 87  Temp: 98 F (36.7 C)  Resp: 18   General: Feeling better, no CP, afebrile, no tachypnea Cardiovascular: S1 and S2, no rubs or gallops  Respiratory: improved air movement, no wheezing; positive scattered rhonchi  Abdomen: soft, NT, ND, positive BS  Musculoskeletal: no edema or cyanosis    Discharge Instructions You were cared for by a hospitalist during your hospital stay. If you have any questions about your discharge medications or the care you received while you were in the hospital after you are discharged, you can call the unit and asked to speak with the hospitalist on call if the hospitalist that took care of you is not available. Once you are discharged, your primary care physician will handle any further medical issues. Please note that NO REFILLS for any discharge medications will be authorized once you are discharged, as it is imperative that you return to your primary care physician (or establish a relationship with a primary care physician if you do not have one) for your aftercare needs so that they can reassess your need for medications and monitor your lab values.  Discharge Orders   Future Orders Complete By Expires   Discharge instructions  As directed        Medication List         albuterol 108 (90 BASE) MCG/ACT inhaler  Commonly known as:  PROVENTIL HFA;VENTOLIN HFA  Inhale 2 puffs into the lungs every 4 (four) hours as needed. FOR SHORTNESS OF BREATH     amoxicillin-clavulanate 875-125 MG per tablet  Commonly known as:  AUGMENTIN  Take 1 tablet by mouth 2 (two) times daily.     aspirin EC 81 MG tablet  Take 81 mg by mouth daily.     budesonide-formoterol 160-4.5 MCG/ACT inhaler  Commonly known as:  SYMBICORT  Inhale  2 puffs into the lungs 2 (two) times daily.     citalopram 40 MG tablet  Commonly known as:  CELEXA  Take 40 mg by mouth daily.     clonazePAM 1 MG tablet  Commonly known as:  KLONOPIN  Take 1 mg by mouth 4 (four) times daily.     cyclobenzaprine 10 MG tablet  Commonly known as:  FLEXERIL  Take 1 tablet by mouth 3 (three) times daily as needed.     gabapentin 600 MG tablet  Commonly known as:  NEURONTIN  Take 600 mg by mouth 3 (three) times daily.     guaiFENesin 600 MG 12 hr tablet  Commonly known as:  MUCINEX  Take 1 tablet (600 mg total) by mouth 2 (two) times daily.     MILK THISTLE EXTRACT PO  Take 3,000 mg by mouth 2 (two) times daily. 1000 mg capsules 3 times daily     NEXIUM 40 MG capsule  Generic drug:  esomeprazole  Take 40 mg by mouth.     nicotine 21 mg/24hr patch  Commonly known as:  NICODERM CQ -  dosed in mg/24 hours  Place 1 patch (21 mg total) onto the skin daily.     ondansetron 4 MG disintegrating tablet  Commonly known as:  ZOFRAN ODT  4mg  ODT q6 hours prn nausea/vomit     Oxycodone HCl 10 MG Tabs  Take 1 tablet (10 mg total) by mouth 4 (four) times daily as needed.     oxyCODONE-acetaminophen 5-325 MG per tablet  Commonly known as:  PERCOCET/ROXICET  Take 1 tablet by mouth every 6 (six) hours as needed for severe pain.     pantoprazole 40 MG tablet  Commonly known as:  PROTONIX  Take 40 mg by mouth daily.     QUEtiapine 300 MG tablet  Commonly known as:  SEROQUEL  Take 1 tablet by mouth daily.     risperiDONE 2 MG tablet  Commonly known as:  RISPERDAL  Take 2 mg by mouth at bedtime.     tiotropium 18 MCG inhalation capsule  Commonly known as:  SPIRIVA  Place 18 mcg into inhaler and inhale.     Vitamin D 2000 UNITS tablet  Take 2,000 Units by mouth daily.     vitamin E 400 UNIT capsule  Generic drug:  vitamin E  Take 400 Units by mouth daily.     XARELTO 20 MG Tabs tablet  Generic drug:  rivaroxaban  Take 20 mg by mouth daily.        No Known Allergies     Follow-up Information   [name]   Specialty:  Family Medicine   Contact information:   [address] [place] [id number] [phone]       Follow up In 10 days.      The results of significant diagnostics from this hospitalization (including imaging, microbiology, ancillary and laboratory) are listed below for reference.    Significant Diagnostic Studies: Dg Chest 1 View  08/17/2013   CLINICAL DATA:  Move fever and weakness.  Right hip pain.  EXAM: CHEST - 1 VIEW  COMPARISON:  CT chest 08/01/2013. PA and lateral chest 08/01/2013 and 12/28/2011.  FINDINGS: There is extensive airspace disease in the right mid and lower lung zones. Left basilar airspace disease seen on the most recent exams has cleared. No pneumothorax or pleural effusion. Heart size is normal.  IMPRESSION: Right mid and lower lung zone airspace disease most consistent with pneumonia.  Left lower lobe airspace disease seen on the most recent studies has cleared.   Electronically Signed   By: Drusilla Kannerhomas  Dalessio M.D.   On: 08/17/2013 14:09   Dg Chest 2 View  08/19/2013   CLINICAL DATA:  Followup pneumonia  EXAM: CHEST  2 VIEW  COMPARISON:  08/17/2013  FINDINGS: Cardiomediastinal silhouette is stable. Persistent patchy pneumonia in right lower lobe with slight improvement in aeration. Follow-up to resolution is recommended. Bony thorax is stable. No pulmonary edema the  IMPRESSION: Persistent patchy pneumonia in right lower lobe with slight improved aeration. Follow-up to resolution is recommended.   Electronically Signed   By: Natasha MeadLiviu  Pop M.D.   On: 08/19/2013 10:38   Dg Chest 2 View  08/01/2013   CLINICAL DATA:  Chest pain, fever.  EXAM: CHEST  2 VIEW  COMPARISON:  December 28, 2011.  FINDINGS: Cardiomediastinal silhouette appears normal. Right lung is clear. No pneumothorax or pleural effusion is noted. Left lower lobe alveolar opacity is noted most consistent with pneumonia. Bony  thorax is intact.  IMPRESSION: Left lower lobe pneumonia.   Electronically Signed  By: Roque Lias M.D.   On: 08/01/2013 10:12   Dg Hip Complete Right  08/17/2013   CLINICAL DATA:  Right hip pain.  EXAM: RIGHT HIP - COMPLETE 2+ VIEW  COMPARISON:  None.  FINDINGS: There is no evidence of hip fracture or dislocation. Mild osteoarthritis present. No bony lesions or destruction. Soft tissues are unremarkable.  IMPRESSION: Mild osteoarthritis of the right hip.   Electronically Signed   By: Irish Lack M.D.   On: 08/17/2013 14:09   Ct Angio Chest Pe W/cm &/or Wo Cm  08/01/2013   CLINICAL DATA:  Cough, fever, chest pain, Mild hypoxemia, history pulmonary embolism  EXAM: CT ANGIOGRAPHY CHEST WITH CONTRAST  TECHNIQUE: Multidetector CT imaging of the chest was performed using the standard protocol during bolus administration of intravenous contrast. Multiplanar CT image reconstructions and MIPs were obtained to evaluate the vascular anatomy. Due to suboptimal opacification of lower lobe pulmonary arteries on initial injection, patient was reinjected. Assessment of the lower lobe arteries remain suboptimal on repeat images particularly in the consolidated left lower lobe.  CONTRAST:  OMNIPAQUE IOHEXOL 350 MG/ML SOLN IV total  COMPARISON:  01/24/2010  FINDINGS: Aorta normal caliber without aneurysm or dissection.  Pulmonary arteries patent.  No definite evidence of pulmonary embolism.  Mildly enlarged right peritracheal lymph node 12 mm short axis image 27.  Additional scattered upper normal to minimally enlarged mediastinal and hilar nodes.  Visualized portion of upper abdomen unremarkable.  Small left pleural effusion.  Extensive consolidation left lower lobe consistent with pneumonia.  Minimal dependent atelectasis right lower lobe.  Small focus of slightly nodular appearing opacity in right upper lobe new since previous exam question infiltrate, 6 mm nodule not excluded.  Remaining lungs clear.  No  pneumothorax or acute osseous findings.  Review of the MIP images confirms the above findings.  IMPRESSION: No definite evidence of pulmonary embolism.  Left lower lobe pneumonia with small associated parapneumonic effusion.  Questionable 6 mm right upper lobe nodule versus focus of infiltrate, recommendation below.  If the patient is at high risk for bronchogenic carcinoma, follow-up chest CT at 6-12 months is recommended. If the patient is at low risk for bronchogenic carcinoma, follow-up chest CT at 12 months is recommended. This recommendation follows the consensus statement: Guidelines for Management of Small Pulmonary Nodules Detected on CT Scans: A Statement from the Fleischner Society as published in Radiology 2005;237:395-400.   Electronically Signed   By: Ulyses Southward M.D.   On: 08/01/2013 12:07    Microbiology: Recent Results (from the past 240 hour(s))  CULTURE, BLOOD (ROUTINE X 2)     Status: None   Collection Time    08/17/13  1:11 PM      Result Value Ref Range Status   Specimen Description BLOOD RIGHT ANTECUBITAL DRAWN BY RN ASP   Final   Special Requests BOTTLES DRAWN AEROBIC ONLY 7CC  IMMUNE:COMPROMISED   Final   Culture NO GROWTH 1 DAY   Final   Report Status PENDING   Incomplete  CULTURE, BLOOD (ROUTINE X 2)     Status: None   Collection Time    08/17/13  1:19 PM      Result Value Ref Range Status   Specimen Description BLOOD LEFT ANTECUBITAL   Final   Special Requests     Final   Value: BOTTLES DRAWN AEROBIC AND ANAEROBIC AEB=8CC ANA=6CC IMMUNE:COMPROMISED   Culture NO GROWTH 1 DAY   Final   Report Status PENDING  Incomplete     Labs: Basic Metabolic Panel:  Recent Labs Lab 08/17/13 1310  NA 138  K 3.6*  CL 101  CO2 25  GLUCOSE 136*  BUN 18  CREATININE 0.90  CALCIUM 9.0   Liver Function Tests:  Recent Labs Lab 08/17/13 1310  AST 27  ALT 10  ALKPHOS 62  BILITOT 1.1  PROT 7.3  ALBUMIN 3.6   CBC:  Recent Labs Lab 08/17/13 1310  WBC 11.9*   NEUTROABS 10.6*  HGB 13.5  HCT 41.8  MCV 94.4  PLT 135*    Signed:  Vassie Loll  [facility] 08/19/2013, 1:57 PM

## 2013-08-19 NOTE — Progress Notes (Signed)
ANTIBIOTIC CONSULT NOTE -   Pharmacy Consult for Vancomycin and Cefepime Indication: pneumonia  No Known Allergies  Patient Measurements: Height: 6\' 4"  (193 cm) Weight: 220 lb (99.791 kg) IBW/kg (Calculated) : 86.8  Vital Signs: Temp: 98 F (36.7 C) (05/08 0427) Temp src: Oral (05/08 0427) BP: 123/53 mmHg (05/08 0427) Pulse Rate: 87 (05/08 0427) Intake/Output from previous day: 05/07 0701 - 05/08 0700 In: 1320 [P.O.:1320] Out: 1200 [Urine:1200] Intake/Output from this shift:    Labs:  Recent Labs  08/17/13 1310  WBC 11.9*  HGB 13.5  PLT 135*  CREATININE 0.90   Estimated Creatinine Clearance: 119.2 ml/min (by C-G formula based on Cr of 0.9).  Recent Labs  08/19/13 0757  VANCOTROUGH 7.3*    Microbiology: Recent Results (from the past 720 hour(s))  CULTURE, BLOOD (ROUTINE X 2)     Status: None   Collection Time    08/01/13  9:42 AM      Result Value Ref Range Status   Specimen Description BLOOD RIGHT ANTECUBITAL DRAWN BY RN   Final   Special Requests BOTTLES DRAWN AEROBIC AND ANAEROBIC 5CC   Final   Culture NO GROWTH 5 DAYS   Final   Report Status 08/06/2013 FINAL   Final  CULTURE, BLOOD (ROUTINE X 2)     Status: None   Collection Time    08/01/13 11:03 AM      Result Value Ref Range Status   Specimen Description BLOOD LEFT ANTECUBITAL   Final   Special Requests BOTTLES DRAWN AEROBIC AND ANAEROBIC 8CC   Final   Culture NO GROWTH 5 DAYS   Final   Report Status 08/06/2013 FINAL   Final  CULTURE, BLOOD (ROUTINE X 2)     Status: None   Collection Time    08/17/13  1:11 PM      Result Value Ref Range Status   Specimen Description BLOOD RIGHT ANTECUBITAL DRAWN BY RN ASP   Final   Special Requests BOTTLES DRAWN AEROBIC ONLY 7CC  IMMUNE:COMPROMISED   Final   Culture NO GROWTH 1 DAY   Final   Report Status PENDING   Incomplete  CULTURE, BLOOD (ROUTINE X 2)     Status: None   Collection Time    08/17/13  1:19 PM      Result Value Ref Range Status   Specimen  Description BLOOD LEFT ANTECUBITAL   Final   Special Requests     Final   Value: BOTTLES DRAWN AEROBIC AND ANAEROBIC AEB=8CC ANA=6CC IMMUNE:COMPROMISED   Culture NO GROWTH 1 DAY   Final   Report Status PENDING   Incomplete   Medical History: Past Medical History  Diagnosis Date  . PE (pulmonary embolism)   . Degenerative disk disease   . Arthritis   . Family history of anesthesia complication     mother has N/V  . Anxiety   . GERD (gastroesophageal reflux disease)   . Headache(784.0)   . Hepatitis     Hep B & C  . Pneumonia     has recurrent pneumonia, last ~1 year ago  . Asthma     has not had an attack in over a year   Medications:  Scheduled:  . aspirin EC  81 mg Oral Daily  . budesonide (PULMICORT) nebulizer solution  0.25 mg Nebulization BID  . ceFEPime (MAXIPIME) IV  1 g Intravenous 3 times per day  . cholecalciferol  2,000 Units Oral Daily  . citalopram  40 mg Oral Daily  .  gabapentin  600 mg Oral TID  . guaiFENesin  600 mg Oral BID  . nicotine  21 mg Transdermal Daily  . pantoprazole  40 mg Oral Daily  . QUEtiapine  300 mg Oral Daily  . risperiDONE  2 mg Oral QHS  . rivaroxaban  20 mg Oral Q supper  . tiotropium  18 mcg Inhalation Daily  . vitamin E  400 Units Oral Daily   Assessment: 53yo male admitted with suspected pneumonia.  Pt has large body habitus and good renal fxn.  Estimated Creatinine Clearance: 119.2 ml/min (by C-G formula based on Cr of 0.9). All doses charted as given, trough level is below target.  Afebrile.  Vancomycin 5/6 >> Cefepime 5/6 >>  Goal of Therapy:  Vancomycin trough level 15-20 mcg/ml  Plan:  Increase Vancomycin 2000mg  IV q12hrs Recheck trough at steady state Cefepime 1gm IV q8h Monitor labs, renal fxn, and cultures  Reena Borromeo A Anaissa Macfadden 08/19/2013,10:16 AM

## 2013-08-19 NOTE — Progress Notes (Signed)
Patient discharged home with wife. IV removed and discharge instructions given. Educated patient about the importance of smoking cessation. Patient left with all belongings and in a stable condition.

## 2013-08-22 LAB — CULTURE, BLOOD (ROUTINE X 2)
CULTURE: NO GROWTH
Culture: NO GROWTH

## 2013-10-31 ENCOUNTER — Ambulatory Visit (HOSPITAL_COMMUNITY)
Admission: RE | Admit: 2013-10-31 | Discharge: 2013-10-31 | Disposition: A | Discharge status: Home / Self Care | Payer: Medicare HMO | Source: Ambulatory Visit | Attending: Family Medicine | Admitting: Family Medicine

## 2013-10-31 ENCOUNTER — Other Ambulatory Visit (HOSPITAL_COMMUNITY): Payer: Self-pay | Admitting: Physician Assistant

## 2013-10-31 DIAGNOSIS — R5381 Other malaise: Secondary | ICD-10-CM | POA: Insufficient documentation

## 2013-10-31 DIAGNOSIS — J4489 Other specified chronic obstructive pulmonary disease: Secondary | ICD-10-CM | POA: Insufficient documentation

## 2013-10-31 DIAGNOSIS — J189 Pneumonia, unspecified organism: Secondary | ICD-10-CM | POA: Diagnosis present

## 2013-10-31 DIAGNOSIS — R5383 Other fatigue: Secondary | ICD-10-CM

## 2013-10-31 DIAGNOSIS — J449 Chronic obstructive pulmonary disease, unspecified: Secondary | ICD-10-CM | POA: Insufficient documentation

## 2013-11-06 ENCOUNTER — Emergency Department (HOSPITAL_COMMUNITY)
Admission: EM | Admit: 2013-11-06 | Discharge: 2013-11-06 | Disposition: A | Discharge status: Home / Self Care | Payer: Medicare HMO | Attending: Emergency Medicine | Admitting: Emergency Medicine

## 2013-11-06 ENCOUNTER — Emergency Department (HOSPITAL_COMMUNITY): Payer: Medicare HMO

## 2013-11-06 ENCOUNTER — Encounter (HOSPITAL_COMMUNITY): Payer: Self-pay | Admitting: Emergency Medicine

## 2013-11-06 DIAGNOSIS — Z86711 Personal history of pulmonary embolism: Secondary | ICD-10-CM | POA: Insufficient documentation

## 2013-11-06 DIAGNOSIS — J441 Chronic obstructive pulmonary disease with (acute) exacerbation: Secondary | ICD-10-CM | POA: Insufficient documentation

## 2013-11-06 DIAGNOSIS — F17200 Nicotine dependence, unspecified, uncomplicated: Secondary | ICD-10-CM | POA: Insufficient documentation

## 2013-11-06 DIAGNOSIS — R319 Hematuria, unspecified: Secondary | ICD-10-CM | POA: Insufficient documentation

## 2013-11-06 DIAGNOSIS — M129 Arthropathy, unspecified: Secondary | ICD-10-CM | POA: Diagnosis not present

## 2013-11-06 DIAGNOSIS — F411 Generalized anxiety disorder: Secondary | ICD-10-CM | POA: Insufficient documentation

## 2013-11-06 DIAGNOSIS — Z7982 Long term (current) use of aspirin: Secondary | ICD-10-CM | POA: Diagnosis not present

## 2013-11-06 DIAGNOSIS — R042 Hemoptysis: Secondary | ICD-10-CM

## 2013-11-06 DIAGNOSIS — Z8701 Personal history of pneumonia (recurrent): Secondary | ICD-10-CM | POA: Diagnosis not present

## 2013-11-06 DIAGNOSIS — J45901 Unspecified asthma with (acute) exacerbation: Secondary | ICD-10-CM

## 2013-11-06 DIAGNOSIS — IMO0002 Reserved for concepts with insufficient information to code with codable children: Secondary | ICD-10-CM | POA: Diagnosis not present

## 2013-11-06 DIAGNOSIS — Z79899 Other long term (current) drug therapy: Secondary | ICD-10-CM | POA: Insufficient documentation

## 2013-11-06 DIAGNOSIS — K219 Gastro-esophageal reflux disease without esophagitis: Secondary | ICD-10-CM | POA: Diagnosis not present

## 2013-11-06 HISTORY — DX: Diaphragmatic hernia without obstruction or gangrene: K44.9

## 2013-11-06 HISTORY — DX: Chronic obstructive pulmonary disease, unspecified: J44.9

## 2013-11-06 LAB — CBC WITH DIFFERENTIAL/PLATELET
BASOS ABS: 0 10*3/uL (ref 0.0–0.1)
Basophils Relative: 1 % (ref 0–1)
Eosinophils Absolute: 0.1 10*3/uL (ref 0.0–0.7)
Eosinophils Relative: 2 % (ref 0–5)
HEMATOCRIT: 43.1 % (ref 39.0–52.0)
HEMOGLOBIN: 14.4 g/dL (ref 13.0–17.0)
LYMPHS PCT: 28 % (ref 12–46)
Lymphs Abs: 1.5 10*3/uL (ref 0.7–4.0)
MCH: 30.2 pg (ref 26.0–34.0)
MCHC: 33.4 g/dL (ref 30.0–36.0)
MCV: 90.4 fL (ref 78.0–100.0)
MONOS PCT: 9 % (ref 3–12)
Monocytes Absolute: 0.5 10*3/uL (ref 0.1–1.0)
Neutro Abs: 3.2 10*3/uL (ref 1.7–7.7)
Neutrophils Relative %: 60 % (ref 43–77)
Platelets: 135 10*3/uL — ABNORMAL LOW (ref 150–400)
RBC: 4.77 MIL/uL (ref 4.22–5.81)
RDW: 13.5 % (ref 11.5–15.5)
WBC: 5.4 10*3/uL (ref 4.0–10.5)

## 2013-11-06 LAB — COMPREHENSIVE METABOLIC PANEL
ALBUMIN: 4.1 g/dL (ref 3.5–5.2)
ALK PHOS: 60 U/L (ref 39–117)
ALT: 9 U/L (ref 0–53)
AST: 12 U/L (ref 0–37)
Anion gap: 12 (ref 5–15)
BILIRUBIN TOTAL: 0.2 mg/dL — AB (ref 0.3–1.2)
BUN: 13 mg/dL (ref 6–23)
CHLORIDE: 100 meq/L (ref 96–112)
CO2: 25 meq/L (ref 19–32)
CREATININE: 0.74 mg/dL (ref 0.50–1.35)
Calcium: 9 mg/dL (ref 8.4–10.5)
GFR calc Af Amer: >90 mL/min (ref >90)
GFR calc non Af Amer: >90 mL/min (ref >90)
Glucose, Bld: 148 mg/dL — ABNORMAL HIGH (ref 70–99)
Potassium: 3.8 mEq/L (ref 3.7–5.3)
Sodium: 137 mEq/L (ref 137–147)
Total Protein: 7.5 g/dL (ref 6.0–8.3)

## 2013-11-06 LAB — URINE MICROSCOPIC-ADD ON

## 2013-11-06 LAB — URINALYSIS, ROUTINE W REFLEX MICROSCOPIC
Bilirubin Urine: NEGATIVE
GLUCOSE, UA: NEGATIVE mg/dL
LEUKOCYTES UA: NEGATIVE
NITRITE: NEGATIVE
PH: 7.5 (ref 5.0–8.0)
Protein, ur: NEGATIVE mg/dL
SPECIFIC GRAVITY, URINE: 1.015 (ref 1.005–1.030)
Urobilinogen, UA: 1 mg/dL (ref 0.0–1.0)

## 2013-11-06 LAB — LIPASE, BLOOD: Lipase: 13 U/L (ref 11–59)

## 2013-11-06 LAB — TROPONIN I

## 2013-11-06 LAB — TYPE AND SCREEN
ABO/RH(D): O POS
Antibody Screen: NEGATIVE

## 2013-11-06 MED ORDER — PANTOPRAZOLE SODIUM 40 MG IV SOLR
40.0000 mg | Freq: Once | INTRAVENOUS | Status: AC
  Administered 2013-11-06: 40 mg via INTRAVENOUS
  Filled 2013-11-06: qty 40

## 2013-11-06 MED ORDER — IOHEXOL 350 MG/ML SOLN
100.0000 mL | Freq: Once | INTRAVENOUS | Status: AC | PRN
  Administered 2013-11-06: 100 mL via INTRAVENOUS

## 2013-11-06 MED ORDER — MORPHINE SULFATE 4 MG/ML IJ SOLN
4.0000 mg | Freq: Once | INTRAMUSCULAR | Status: AC
  Administered 2013-11-06: 4 mg via INTRAVENOUS
  Filled 2013-11-06: qty 1

## 2013-11-06 MED ORDER — SODIUM CHLORIDE 0.9 % IV SOLN
1000.0000 mL | Freq: Once | INTRAVENOUS | Status: AC
Start: 2013-11-06 — End: 2013-11-06
  Administered 2013-11-06: 1000 mL via INTRAVENOUS

## 2013-11-06 MED ORDER — ONDANSETRON HCL 4 MG/2ML IJ SOLN
4.0000 mg | Freq: Once | INTRAMUSCULAR | Status: AC
  Administered 2013-11-06: 4 mg via INTRAMUSCULAR
  Filled 2013-11-06: qty 2

## 2013-11-06 NOTE — ED Provider Notes (Signed)
CSN: [id number]     Arrival date & time 11/06/13  1041 History   First MD Initiated Contact with Patient 11/06/13 1056     Chief Complaint  Patient presents with  . Hemoptysis  . Hematuria     (Consider location/radiation/quality/duration/timing/severity/associated sxs/prior Treatment) The history is provided by the patient.   [name] is a 53 y.o. 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 He went back to sleep and when he woke,  Wife reports he had dried blood on his lips and chin.  In addition to shortness of breath he describes upper abdominal pain which also started today.  This is constant, aching and does not radiate. He reports history of GERD which is treated with protonix.  He denies hematemesis. He denies chest pain, fevers, chills, nausea, vomiting, has had no diarrhea.  He does report intermittent episodes of hematuria over the past 2 months.    Past Medical History  Diagnosis Date  . PE (pulmonary embolism)   . Degenerative disk disease   . Arthritis   . Family history of anesthesia complication     mother has N/V  . Anxiety   . GERD (gastroesophageal reflux disease)   . Headache(784.0)   . Pneumonia     has recurrent pneumonia, last ~1 year ago  . Asthma     has not had an attack in over a year  . COPD (chronic obstructive pulmonary disease)   . Hiatal hernia    Past Surgical History  Procedure Laterality Date  . Cholecystectomy    . Hernia repair      umbilical  . Closed reduction mandible with mandibuloma  10/21/2011    Procedure: CLOSED REDUCTION MANDIBLE WITH MANDIBULOMAXILLARY FUSION;  Surgeon: Darletta Moll, MD;  Location: [facility] OR;  Service: ENT;  Laterality: N/A;  MMF screws  . Orif mandibular fracture  10/21/2011    Procedure:  OPEN REDUCTION INTERNAL FIXATION (ORIF) MANDIBULAR FRACTURE;  Surgeon: Darletta Moll, MD;  Location: [facility] OR;  Service: ENT;  Laterality: N/A;  . Hand surgery  20 years ago    right  . Mandibular hardware removal  11/11/2011    Procedure: MANDIBULAR HARDWARE REMOVAL;  Surgeon: Darletta Moll, MD;  Location: [facility];  Service: ENT;  Laterality: N/A;  . Leg surgery     Family History  Problem Relation Age of Onset  . Coronary artery disease Mother   . Coronary artery disease Father   . Diabetes Other    History  Substance Use Topics  . Smoking status: Current Every Day Smoker -- 0.50 packs/day for 35 years    Types: Cigarettes  . Smokeless tobacco: Never Used  . Alcohol Use: No    Review of Systems  Constitutional: Negative for fever and chills.  HENT: Negative for congestion, nosebleeds and sore throat.   Eyes: Negative.   Respiratory: Positive for shortness of breath. Negative for chest tightness, wheezing and stridor.   Cardiovascular: Negative for chest pain.  Gastrointestinal: Negative for nausea and abdominal pain.  Genitourinary: Positive for hematuria.  Musculoskeletal: Negative for arthralgias, joint swelling and neck pain.  Skin: Negative.  Negative for rash and wound.  Neurological: Negative for dizziness, weakness, light-headedness, numbness and headaches.  Hematological: Does not bruise/bleed easily.  Psychiatric/Behavioral:  Negative.       Allergies  Review of patient's allergies indicates no known allergies.  Home Medications   Prior to Admission medications   Medication Sig Start Date End Date Taking? Authorizing Provider  albuterol (PROVENTIL HFA;VENTOLIN HFA) 108 (90 BASE) MCG/ACT inhaler Inhale 2 puffs into the lungs every 4 (four) hours as needed. FOR SHORTNESS OF BREATH   Yes Historical Provider, MD  aspirin EC 81 MG tablet Take 81 mg by mouth daily.   Yes Historical Provider, MD  budesonide-formoterol (SYMBICORT) 160-4.5 MCG/ACT inhaler  Inhale 2 puffs into the lungs 2 (two) times daily. 08/19/13  Yes Vassie Lollarlos Madera, MD  Cholecalciferol (VITAMIN D) 2000 UNITS tablet Take 2,000 Units by mouth daily.    Yes Historical Provider, MD  citalopram (CELEXA) 40 MG tablet Take 40 mg by mouth daily.   Yes Historical Provider, MD  clonazePAM (KLONOPIN) 1 MG tablet Take 0.5-1 mg by mouth 4 (four) times daily.    Yes Historical Provider, MD  Coenzyme Q10 (CO Q 10) 10 MG CAPS Take 1 capsule by mouth daily.   Yes Historical Provider, MD  cyclobenzaprine (FLEXERIL) 10 MG tablet Take 1 tablet by mouth 3 (three) times daily as needed for muscle spasms.  08/10/13  Yes Historical Provider, MD  gabapentin (NEURONTIN) 600 MG tablet Take 600 mg by mouth 3 (three) times daily.   Yes Historical Provider, MD  MILK THISTLE EXTRACT PO Take 3,000 mg by mouth 2 (two) times daily. 1000 mg capsules 3 times daily   Yes Historical Provider, MD  ondansetron (ZOFRAN ODT) 4 MG disintegrating tablet 4mg  ODT q6 hours prn nausea/vomit 10/26/12  Yes Benny LennertJoseph L Zammit, MD  Oxycodone HCl 10 MG TABS Take 1 tablet (10 mg total) by mouth 4 (four) times daily as needed. 12/12/11  Yes Carleene CooperAlan Davidson III, MD  pantoprazole (PROTONIX) 40 MG tablet Take 40 mg by mouth daily.   Yes Historical Provider, MD  QUEtiapine (SEROQUEL) 300 MG tablet Take 1 tablet by mouth daily. 08/04/13  Yes Historical Provider, MD  risperiDONE (RISPERDAL) 2 MG tablet Take 2 mg by mouth at bedtime.   Yes Historical Provider, MD  Rivaroxaban (XARELTO) 20 MG TABS Take 20 mg by mouth daily.   Yes Historical Provider, MD  tiotropium (SPIRIVA) 18 MCG inhalation capsule Place 18 mcg into inhaler and inhale.   Yes Historical Provider, MD  vitamin E (VITAMIN E) 400 UNIT capsule Take 400 Units by mouth daily.    Yes Historical Provider, MD   BP 126/91  Pulse 94  Temp(Src) 97.8 F (36.6 C) (Oral)  Resp 15  Ht 6\' 4"  (1.93 m)  Wt 226 lb (102.513 kg)  BMI 27.52 kg/m2  SpO2 92% Physical Exam  Nursing note and vitals  reviewed. Constitutional: He appears well-developed and well-nourished.  HENT:  Head: Normocephalic and atraumatic.  Mouth/Throat: Uvula is midline and oropharynx is clear and moist.  Eyes: Conjunctivae are normal.  No conjunctival pallor  Neck: Normal range of motion.  Cardiovascular: Normal rate, regular rhythm, normal heart sounds and intact distal pulses.   Pulmonary/Chest: Effort normal and breath sounds normal. No respiratory distress. He has no wheezes. He exhibits no tenderness.  Abdominal: Soft. Bowel sounds are normal. He exhibits no distension and no mass. There is tenderness. There is no rebound and no guarding.  Mild ttp epigastrium.  No rebound.  No guarding.  Musculoskeletal: Normal range of motion. He exhibits no edema and no tenderness.  Neurological: He is alert.  Skin: Skin is  warm and dry. No bruising and no petechiae noted.  Psychiatric: He has a normal mood and affect.    ED Course  Procedures (including critical care time) Labs Review Labs Reviewed  CBC WITH DIFFERENTIAL - Abnormal; Notable for the following:    Platelets 135 (*)    All other components within normal limits  COMPREHENSIVE METABOLIC PANEL - Abnormal; Notable for the following:    Glucose, Bld 148 (*)    Total Bilirubin 0.2 (*)    All other components within normal limits  URINALYSIS, ROUTINE W REFLEX MICROSCOPIC - Abnormal; Notable for the following:    Color, Urine AMBER (*)    APPearance HAZY (*)    Hgb urine dipstick LARGE (*)    Ketones, ur TRACE (*)    All other components within normal limits  URINE MICROSCOPIC-ADD ON - Abnormal; Notable for the following:    Bacteria, UA FEW (*)    All other components within normal limits  LIPASE, BLOOD  TROPONIN I  TYPE AND SCREEN    Imaging Review Ct Angio Chest Pe W/cm &/or Wo Cm  11/06/2013   CLINICAL DATA:  Shortness of breath, coughing up blood  EXAM: CT ANGIOGRAPHY CHEST WITH CONTRAST  TECHNIQUE: Multidetector CT imaging of the chest  was performed using the standard protocol during bolus administration of intravenous contrast. Multiplanar CT image reconstructions and MIPs were obtained to evaluate the vascular anatomy.  CONTRAST:  OMNIPAQUE IOHEXOL 350 MG/ML SOLN  COMPARISON:  08/01/2013  FINDINGS: Pulmonary arteries are well visualized. No significant filling defect or pulmonary embolus is demonstrated by CTA. Normal heart size. No pericardial or pleural effusion. Nonenlarged but mildly prominent mediastinal lymph nodes as before, suspect reactive. Intact thoracic aorta and major branch vessels.  Included upper abdomen demonstrates prior cholecystectomy.  Lung windows demonstrate scattered minor scarring and atelectasis bilaterally. Near complete resolution of the left lower lobe consolidative pneumonia with minor residual bronchovascular/tree-in-bud opacity remaining in the left lower lobe anteriorly. Trachea and central airways are patent.  Minor thoracic degenerative change.  Review of the MIP images confirms the above findings.  IMPRESSION: Negative for significant acute pulmonary embolus by CTA.  Scattered parenchymal scarring and atelectasis throughout both lungs.  Nearly resolved left lower lobe consolidative pneumonia.   Electronically Signed   By: Ruel Favors M.D.   On: 11/06/2013 13:44   Dg Chest Port 1 View  11/06/2013   CLINICAL DATA:  Hemoptysis and shortness of breath.  EXAM: PORTABLE CHEST - 1 VIEW  COMPARISON:  Chest x-ray 10/31/2013.  FINDINGS: Diffuse peribronchial cuffing. Lung volumes are normal. No consolidative airspace disease. No pleural effusions. No pneumothorax. No pulmonary nodule or mass noted. Pulmonary vasculature and the cardiomediastinal silhouette are within normal limits.  IMPRESSION: 1. Diffuse peribronchial cuffing suggestive of bronchitis.   Electronically Signed   By: Trudie Reed M.D.   On: 11/06/2013 11:31     EKG Interpretation   Date/Time:  Sunday November 06 2013 11:02:38  EDT Ventricular Rate:  92 PR Interval:  148 QRS Duration: 102 QT Interval:  365 QTC Calculation: 451 R Axis:   75 Text Interpretation:  Sinus rhythm Normal ECG No significant change since  last tracing Confirmed by Bebe Shaggy  MD, Dorinda Hill ([id number]) on 11/06/2013  12:53:48 PM      MDM   Final diagnoses:  Hemoptysis  Hematuria    Patients labs and/or radiological studies were viewed and considered during the medical decision making and disposition process. Pt was seen and dispo'd by  Dr Bebe Shaggy during this ed visit.  Labs stable. Ct angio negative for PE.  Resolved pneumonia.  Referral to GU for further eval of hematuria.  F/u with pcp prn.  The patient appears reasonably screened and/or stabilized for discharge and I doubt any other medical condition or other [facility] requiring further screening, evaluation, or treatment in the ED at this time prior to discharge.     Burgess Amor, PA-C 11/07/13 2122

## 2013-11-06 NOTE — Discharge Instructions (Signed)
Hematuria Hematuria is blood in your urine. It can be caused by a bladder infection, kidney infection, prostate infection, kidney stone, or cancer of your urinary tract. Infections can usually be treated with medicine, and a kidney stone usually will pass through your urine. If neither of these is the cause of your hematuria, further workup to find out the reason may be needed. It is very important that you tell your health care provider about any blood you see in your urine, even if the blood stops without treatment or happens without causing pain. Blood in your urine that happens and then stops and then happens again can be a symptom of a very serious condition. Also, pain is not a symptom in the initial stages of many urinary cancers. HOME CARE INSTRUCTIONS   Drink lots of fluid, 3-4 quarts a day. If you have been diagnosed with an infection, cranberry juice is especially recommended, in addition to large amounts of water.  Avoid caffeine, tea, and carbonated beverages because they tend to irritate the bladder.  Avoid alcohol because it may irritate the prostate.  Take all medicines as directed by your health care provider.  If you were prescribed an antibiotic medicine, finish it all even if you start to feel better.  If you have been diagnosed with a kidney stone, follow your health care provider's instructions regarding straining your urine to catch the stone.  Empty your bladder often. Avoid holding urine for long periods of time.  After a bowel movement, women should cleanse front to back. Use each tissue only once.  Empty your bladder before and after sexual intercourse if you are a male. SEEK MEDICAL CARE IF:  You develop back pain.  You have a fever.  You have a feeling of sickness in your stomach (nausea) or vomiting.  Your symptoms are not better in 3 days. Return sooner if you are getting worse. SEEK IMMEDIATE MEDICAL CARE IF:   You develop severe vomiting and are  unable to keep the medicine down.  You develop severe back or abdominal pain despite taking your medicines.  You begin passing a large amount of blood or clots in your urine.  You feel extremely weak or faint, or you pass out. MAKE SURE YOU:   Understand these instructions.  Will watch your condition. Hemoptysis Hemoptysis means coughing up blood. The blood may come from the lungs and airways. It can also come from bleeding that occurs outside the lungs and airways. Coughing up blood can be a sign of a minor problem or a serious medical condition.  HOME CARE Only take medicine as told by your doctor. Do not use medicines that help you stop coughing (cough suppressants) unless your doctor approves. If you are given antibiotic medicine, take it as told. Finish it even if you start to feel better. Do not smoke. Also avoid being around others when they are smoking. Follow up with your doctor as told. GET HELP RIGHT AWAY IF: You cough up bloody spit (mucus) for longer than a week. You have a blood-producing cough that is severe or getting worse. You have a blood-producing cough thatcomes and goes over time. You have trouble breathing.  You throw up (vomit) blood. You have bloody or black poop (stool). You have chest pain.  You have night sweats. You feel faint or pass out.  You have a fever or lasting symptoms for more than 2-3 days. You have a fever and your symptoms suddenly get worse. MAKE SURE YOU: Understand  these instructions. Will watch your condition. Will get help right away if you are not doing well or get worse. Document Released: 03/17/2012 Document Reviewed: 03/17/2012 [facility] Patient Information 2015 [place], [place]. This information is not intended to replace advice given to you by your health care provider. Make sure you discuss any questions you have with your health care provider.   Will get help right away if you are not doing well or get worse. Document  Released: 03/31/2005 Document Revised: 08/15/2013 Document Reviewed: 11/29/2012 [facility] Patient Information 2015 [place], [place]. This information is not intended to replace advice given to you by your health care provider. Make sure you discuss any questions you have with your health care provider.

## 2013-11-06 NOTE — ED Provider Notes (Signed)
EKG Interpretation  Date/Time:  Sunday November 06 2013 11:02:38 EDT Ventricular Rate:  92 PR Interval:  148 QRS Duration: 102 QT Interval:  365 QTC Calculation: 451 R Axis:   75 Text Interpretation:  Sinus rhythm Normal ECG No significant change since last tracing Confirmed by Bebe ShaggyWICKLINE  MD, Hyun Marsalis ([id number]) on 11/06/2013 12:53:48 PM        Joya Gaskinsonald W Shondale Quinley, MD 11/06/13 1309

## 2013-11-06 NOTE — ED Provider Notes (Signed)
Patient seen/examined in the Emergency Department in conjunction with Midlevel Provider Idol Patient reports hemoptysis/sob Exam : awake/alert, no distress Plan: plan for CT chest to r/o recurrent PE with xarelto failure    Joya Gaskinsonald W Akiko Schexnider, MD 11/06/13 [phone]

## 2013-11-06 NOTE — ED Notes (Signed)
Patient reports coughing up a large amount of blood this morning with shortness of breath. Patient reports hx of PE in which he takes Xerelto.  Patient also reports upper abd pain. Per patient also has had intermittent blood in urine x2 months.

## 2013-11-08 NOTE — ED Provider Notes (Signed)
Medical screening examination/treatment/procedure(s) were conducted as a shared visit with non-physician practitioner(s) and myself.  I personally evaluated the patient during the encounter.   EKG Interpretation   Date/Time:  Sunday November 06 2013 11:02:38 EDT Ventricular Rate:  92 PR Interval:  148 QRS Duration: 102 QT Interval:  365 QTC Calculation: 451 R Axis:   75 Text Interpretation:  Sinus rhythm Normal ECG No significant change since  last tracing Confirmed by Bebe ShaggyWICKLINE  MD, Dorinda HillNALD ([id number]) on 11/06/2013  12:53:48 PM       Pt given referral to urology for his hematuria Pt stable for d/c home  Joya Gaskinsonald W Kayen Grabel, MD 11/08/13 [phone]

## 2014-06-01 ENCOUNTER — Emergency Department (HOSPITAL_COMMUNITY)
Admission: EM | Admit: 2014-06-01 | Discharge: 2014-06-01 | Disposition: A | Discharge status: Home / Self Care | Payer: Medicare HMO | Attending: Emergency Medicine | Admitting: Emergency Medicine

## 2014-06-01 ENCOUNTER — Encounter (HOSPITAL_COMMUNITY): Payer: Self-pay | Admitting: *Deleted

## 2014-06-01 ENCOUNTER — Emergency Department (HOSPITAL_COMMUNITY): Payer: Medicare HMO

## 2014-06-01 DIAGNOSIS — Z79899 Other long term (current) drug therapy: Secondary | ICD-10-CM | POA: Insufficient documentation

## 2014-06-01 DIAGNOSIS — M1990 Unspecified osteoarthritis, unspecified site: Secondary | ICD-10-CM | POA: Insufficient documentation

## 2014-06-01 DIAGNOSIS — J441 Chronic obstructive pulmonary disease with (acute) exacerbation: Secondary | ICD-10-CM | POA: Diagnosis not present

## 2014-06-01 DIAGNOSIS — F419 Anxiety disorder, unspecified: Secondary | ICD-10-CM | POA: Diagnosis not present

## 2014-06-01 DIAGNOSIS — Z720 Tobacco use: Secondary | ICD-10-CM | POA: Diagnosis not present

## 2014-06-01 DIAGNOSIS — R05 Cough: Secondary | ICD-10-CM | POA: Diagnosis present

## 2014-06-01 DIAGNOSIS — K219 Gastro-esophageal reflux disease without esophagitis: Secondary | ICD-10-CM | POA: Diagnosis not present

## 2014-06-01 DIAGNOSIS — Z7951 Long term (current) use of inhaled steroids: Secondary | ICD-10-CM | POA: Diagnosis not present

## 2014-06-01 DIAGNOSIS — J159 Unspecified bacterial pneumonia: Secondary | ICD-10-CM | POA: Diagnosis not present

## 2014-06-01 DIAGNOSIS — Z7982 Long term (current) use of aspirin: Secondary | ICD-10-CM | POA: Insufficient documentation

## 2014-06-01 DIAGNOSIS — Z8701 Personal history of pneumonia (recurrent): Secondary | ICD-10-CM | POA: Insufficient documentation

## 2014-06-01 DIAGNOSIS — Z86711 Personal history of pulmonary embolism: Secondary | ICD-10-CM | POA: Diagnosis not present

## 2014-06-01 DIAGNOSIS — J189 Pneumonia, unspecified organism: Secondary | ICD-10-CM

## 2014-06-01 MED ORDER — AMOXICILLIN-POT CLAVULANATE 875-125 MG PO TABS: 1.0000 | ORAL_TABLET | Freq: Two times a day (BID) | ORAL | Status: DC

## 2014-06-01 MED ORDER — HYDROCOD POLST-CHLORPHEN POLST 10-8 MG/5ML PO LQCR
5.0000 mL | Freq: Once | ORAL | Status: AC
  Administered 2014-06-01: 5 mL via ORAL
  Filled 2014-06-01: qty 5

## 2014-06-01 MED ORDER — ALBUTEROL SULFATE HFA 108 (90 BASE) MCG/ACT IN AERS
2.0000 | INHALATION_SPRAY | RESPIRATORY_TRACT | Status: DC | PRN
  Administered 2014-06-01: 2 via RESPIRATORY_TRACT
  Filled 2014-06-01: qty 6.7

## 2014-06-01 MED ORDER — HYDROCOD POLST-CHLORPHEN POLST 10-8 MG/5ML PO LQCR: 5.0000 mL | Freq: Two times a day (BID) | ORAL | Status: DC

## 2014-06-01 MED ORDER — LIDOCAINE HCL (PF) 1 % IJ SOLN
INTRAMUSCULAR | Status: AC
  Filled 2014-06-01: qty 5

## 2014-06-01 MED ORDER — PROMETHAZINE HCL 12.5 MG PO TABS
25.0000 mg | ORAL_TABLET | Freq: Once | ORAL | Status: AC
  Administered 2014-06-01: 25 mg via ORAL
  Filled 2014-06-01: qty 2

## 2014-06-01 MED ORDER — CEFTRIAXONE SODIUM 1 G IJ SOLR
1.0000 g | Freq: Once | INTRAMUSCULAR | Status: AC
  Administered 2014-06-01: 1 g via INTRAMUSCULAR
  Filled 2014-06-01: qty 10

## 2014-06-01 NOTE — Discharge Instructions (Signed)
Pneumonia °Pneumonia is an infection of the lungs.  °CAUSES °Pneumonia may be caused by bacteria or a virus. Usually, these infections are caused by breathing infectious particles into the lungs (respiratory tract). °SIGNS AND SYMPTOMS  °· Cough. °· Fever. °· Chest pain. °· Increased rate of breathing. °· Wheezing. °· Mucus production. °DIAGNOSIS  °If you have the common symptoms of pneumonia, your health care provider will typically confirm the diagnosis with a chest X-ray. The X-ray will show an abnormality in the lung (pulmonary infiltrate) if you have pneumonia. Other tests of your blood, urine, or sputum may be done to find the specific cause of your pneumonia. Your health care provider may also do tests (blood gases or pulse oximetry) to see how well your lungs are working. °TREATMENT  °Some forms of pneumonia may be spread to other people when you cough or sneeze. You may be asked to wear a mask before and during your exam. Pneumonia that is caused by bacteria is treated with antibiotic medicine. Pneumonia that is caused by the influenza virus may be treated with an antiviral medicine. Most other viral infections must run their course. These infections will not respond to antibiotics.  °HOME CARE INSTRUCTIONS  °· Cough suppressants may be used if you are losing too much rest. However, coughing protects you by clearing your lungs. You should avoid using cough suppressants if you can. °· Your health care provider may have prescribed medicine if he or she thinks your pneumonia is caused by bacteria or influenza. Finish your medicine even if you start to feel better. °· Your health care provider may also prescribe an expectorant. This loosens the mucus to be coughed up. °· Take medicines only as directed by your health care provider. °· Do not smoke. Smoking is a common cause of bronchitis and can contribute to pneumonia. If you are a smoker and continue to smoke, your cough may last several weeks after your  pneumonia has cleared. °· A cold steam vaporizer or humidifier in your room or home may help loosen mucus. °· Coughing is often worse at night. Sleeping in a semi-upright position in a recliner or using a couple pillows under your head will help with this. °· Get rest as you feel it is needed. Your body will usually let you know when you need to rest. °PREVENTION °A pneumococcal shot (vaccine) is available to prevent a common bacterial cause of pneumonia. This is usually suggested for: °· People over 65 years old. °· Patients on chemotherapy. °· People with chronic lung problems, such as bronchitis or emphysema. °· People with immune system problems. °If you are over 65 or have a high risk condition, you may receive the pneumococcal vaccine if you have not received it before. In some countries, a routine influenza vaccine is also recommended. This vaccine can help prevent some cases of pneumonia. You may be offered the influenza vaccine as part of your care. °If you smoke, it is time to quit. You may receive instructions on how to stop smoking. Your health care provider can provide medicines and counseling to help you quit. °SEEK MEDICAL CARE IF: °You have a fever. °SEEK IMMEDIATE MEDICAL CARE IF:  °· Your illness becomes worse. This is especially true if you are elderly or weakened from any other disease. °· You cannot control your cough with suppressants and are losing sleep. °· You begin coughing up blood. °· You develop pain which is getting worse or is uncontrolled with medicines. °· Any of the symptoms   which initially brought you in for treatment are getting worse rather than better.  You develop shortness of breath or chest pain. MAKE SURE YOU:   Understand these instructions.  Will watch your condition.  Will get help right away if you are not doing well or get worse. Document Released: 03/31/2005 Document Revised: 08/15/2013 Document Reviewed: 06/20/2010 [facility] Patient Information 2015  [place]. This information is not intended to replace advice given to you by your health care provider. Make sure you discuss any questions you have with your health care provider.   Continue using your inhaler - 2 puffs every 4 hours of your albuterol.  Rest and make sure you are drinking plenty of fluids.  Take your antibiotic as prescribed.

## 2014-06-01 NOTE — ED Notes (Signed)
Cough , fever for 4 days, Hx of pneumonia

## 2014-06-01 NOTE — ED Notes (Signed)
nad noted prior to dc. Dc instructions reviewed and explained. Voiced understanding. 2 Rx given to pt. Ambulated out without difficulty.

## 2014-06-03 NOTE — ED Provider Notes (Signed)
CSN: [id number]     Arrival date & time 06/01/14  1153 History   First MD Initiated Contact with Patient 06/01/14 1343     Chief Complaint  Patient presents with  . Cough     (Consider location/radiation/quality/duration/timing/severity/associated sxs/prior Treatment) The history is provided by the patient.   [name] is a 54 y.o. male with a past medical history pertinent for multiple episodes of pneumonia, the last occuring just May 2015, COPD who continues to smoke but also pulmonary embolism, with a now 4 day history of cough which has been productive of thick green sputum, subjective and shortness of breath with exertion and generalized fatigue.  He denies chest pain except for rib soreness triggered by coughing.  He has been using his albuterol mdi which has helped wheezing. He denies peripheral edema or leg pain, no recent long travel or periods of inactivity.  Fever has been to 102.     Past Medical History  Diagnosis Date  . PE (pulmonary embolism)   . Degenerative disk disease   . Arthritis   . Family history of anesthesia complication     mother has N/V  . Anxiety   . GERD (gastroesophageal reflux disease)   . Headache(784.0)   . Pneumonia     has recurrent pneumonia, last ~1 year ago  . Asthma     has not had an attack in over a year  . COPD (chronic obstructive pulmonary disease)   . Hiatal hernia    Past Surgical History  Procedure Laterality Date  . Cholecystectomy    . Hernia repair      umbilical  . Closed reduction mandible with mandibuloma  10/21/2011    Procedure: CLOSED REDUCTION MANDIBLE WITH MANDIBULOMAXILLARY FUSION;  Surgeon: Darletta Moll, MD;  Location: [facility] OR;  Service: ENT;  Laterality: N/A;  MMF screws  . Orif mandibular fracture  10/21/2011    Procedure: OPEN REDUCTION INTERNAL FIXATION (ORIF) MANDIBULAR FRACTURE;  Surgeon: Darletta Moll, MD;  Location: [facility] OR;  Service: ENT;  Laterality: N/A;  . Hand surgery  20 years ago    right  . Mandibular  hardware removal  11/11/2011    Procedure: MANDIBULAR HARDWARE REMOVAL;  Surgeon: Darletta Moll, MD;  Location: [facility];  Service: ENT;  Laterality: N/A;  . Leg surgery     Family History  Problem Relation Age of Onset  . Coronary artery disease Mother   . Coronary artery disease Father   . Diabetes Other    History  Substance Use Topics  . Smoking status: Current Every Day Smoker -- 0.50 packs/day for 35 years    Types: Cigarettes  . Smokeless tobacco: Never Used  . Alcohol Use: No    Review of Systems  Constitutional: Positive for fever and fatigue.  HENT: Negative for congestion and sore throat.   Eyes: Negative.   Respiratory: Positive for cough, shortness of breath and wheezing. Negative for chest tightness.   Cardiovascular: Negative for chest pain.  Gastrointestinal: Negative for nausea, vomiting and abdominal pain.  Genitourinary: Negative.   Musculoskeletal: Negative for joint swelling, arthralgias and neck pain.  Skin: Negative.  Negative for rash and wound.  Neurological: Negative for dizziness, weakness, light-headedness, numbness and headaches.  Psychiatric/Behavioral: Negative.       Allergies  Review of patient's allergies indicates no known allergies.  Home Medications   Prior to Admission medications   Medication Sig Start Date End Date Taking? Authorizing Provider  albuterol (  PROVENTIL HFA;VENTOLIN HFA) 108 (90 BASE) MCG/ACT inhaler Inhale 2 puffs into the lungs every 4 (four) hours as needed. FOR SHORTNESS OF BREATH    Historical Provider, MD  amoxicillin-clavulanate (AUGMENTIN) 875-125 MG per tablet Take 1 tablet by mouth every 12 (twelve) hours. 06/01/14   Burgess AmorJulie Kelsee Preslar, PA-C  aspirin EC 81 MG tablet Take 81 mg by mouth daily.    Historical Provider, MD  budesonide-formoterol (SYMBICORT) 160-4.5 MCG/ACT inhaler Inhale 2 puffs into the lungs 2 (two) times daily. 08/19/13   Vassie Lollarlos Madera, MD  chlorpheniramine-HYDROcodone [facility](TUSSIONEX PENNKINETIC  ER) 10-8 MG/5ML LQCR Take 5 mLs by mouth 2 (two) times daily. 06/01/14   Burgess AmorJulie Ellar Hakala, PA-C  Cholecalciferol (VITAMIN D) 2000 UNITS tablet Take 2,000 Units by mouth daily.     Historical Provider, MD  citalopram (CELEXA) 40 MG tablet Take 40 mg by mouth daily.    Historical Provider, MD  clonazePAM (KLONOPIN) 1 MG tablet Take 0.5-1 mg by mouth 4 (four) times daily.     Historical Provider, MD  Coenzyme Q10 (CO Q 10) 10 MG CAPS Take 1 capsule by mouth daily.    Historical Provider, MD  cyclobenzaprine (FLEXERIL) 10 MG tablet Take 1 tablet by mouth 3 (three) times daily as needed for muscle spasms.  08/10/13   Historical Provider, MD  gabapentin (NEURONTIN) 600 MG tablet Take 600 mg by mouth 3 (three) times daily.    Historical Provider, MD  MILK THISTLE EXTRACT PO Take 3,000 mg by mouth 2 (two) times daily. 1000 mg capsules 3 times daily    Historical Provider, MD  ondansetron (ZOFRAN ODT) 4 MG disintegrating tablet 4mg  ODT q6 hours prn nausea/vomit 10/26/12   Benny LennertJoseph L Zammit, MD  Oxycodone HCl 10 MG TABS Take 1 tablet (10 mg total) by mouth 4 (four) times daily as needed. 12/12/11   Carleene CooperAlan Davidson, MD  pantoprazole (PROTONIX) 40 MG tablet Take 40 mg by mouth daily.    Historical Provider, MD  QUEtiapine (SEROQUEL) 300 MG tablet Take 1 tablet by mouth daily. 08/04/13   Historical Provider, MD  risperiDONE (RISPERDAL) 2 MG tablet Take 2 mg by mouth at bedtime.    Historical Provider, MD  Rivaroxaban (XARELTO) 20 MG TABS Take 20 mg by mouth daily.    Historical Provider, MD  tiotropium (SPIRIVA) 18 MCG inhalation capsule Place 18 mcg into inhaler and inhale.    Historical Provider, MD  vitamin E (VITAMIN E) 400 UNIT capsule Take 400 Units by mouth daily.     Historical Provider, MD   BP 121/95 mmHg  Pulse 97  Temp(Src) 98.4 F (36.9 C) (Oral)  Resp 20  Ht 6\' 4"  (1.93 m)  Wt 225 lb (102.059 kg)  BMI 27.40 kg/m2  SpO2 99% Physical Exam  Constitutional: He appears well-developed and well-nourished. No  distress.  HENT:  Head: Normocephalic and atraumatic.  Eyes: Conjunctivae are normal.  Neck: Normal range of motion.  Cardiovascular: Normal rate, regular rhythm, normal heart sounds and intact distal pulses.   Pulmonary/Chest: Effort normal. No respiratory distress. He has decreased breath sounds in the right middle field and the right lower field. He has no wheezes. He has no rhonchi. He exhibits no tenderness.  Abdominal: Soft. Bowel sounds are normal. There is no tenderness.  Musculoskeletal: Normal range of motion. He exhibits no edema or tenderness.  Neurological: He is alert.  Skin: Skin is warm and dry. He is not diaphoretic.  Psychiatric: He has a normal mood and affect.  Nursing note and vitals  reviewed.   ED Course  Procedures (including critical care time) Labs Review Labs Reviewed - No data to display  Imaging Review Dg Chest 2 View  06/01/2014   CLINICAL DATA:  Cough for 4 days, shortness of Breath  EXAM: CHEST  2 VIEW  COMPARISON:  None.  FINDINGS: Cardiac shadow is within normal limits. Increased a amorphous density is noted in the superior aspect of the right lower lobe. Changes are most consistent with focal pneumonia. Left lung is clear. No bony abnormality is noted.  IMPRESSION: Changes most consistent with right lower lobe pneumonia. Short-term followup following appropriate therapy is recommended.   Electronically Signed   By: Alcide Clever M.D.   On: 06/01/2014 13:07     EKG Interpretation None      MDM   Final diagnoses:  Community acquired pneumonia    Patients labs and/or radiological studies were viewed and considered during the medical decision making and disposition process. Exam, xray and history most suggesting infectious process given sputum production, fever. Does not suggest PE.  Pt was given rocephin, prescribed augmentin.  Rest, continue mdi for wheezing. Advised close f/u with pcp.  Return here for worsened sx. Pt and wife at bedside agree with  plan.  Pt was treated with augmentin with his last similar episode and responded well.    Burgess Amor, PA-C 06/03/14 1610  Audree Camel, MD 06/08/14 [phone]

## 2014-06-26 ENCOUNTER — Emergency Department (HOSPITAL_COMMUNITY): Payer: Medicare HMO

## 2014-06-26 ENCOUNTER — Emergency Department (HOSPITAL_COMMUNITY)
Admission: EM | Admit: 2014-06-26 | Discharge: 2014-06-26 | Disposition: A | Discharge status: Home / Self Care | Payer: Medicare HMO | Attending: Emergency Medicine | Admitting: Emergency Medicine

## 2014-06-26 ENCOUNTER — Encounter (HOSPITAL_COMMUNITY): Payer: Self-pay

## 2014-06-26 DIAGNOSIS — Z79899 Other long term (current) drug therapy: Secondary | ICD-10-CM | POA: Diagnosis not present

## 2014-06-26 DIAGNOSIS — Z7902 Long term (current) use of antithrombotics/antiplatelets: Secondary | ICD-10-CM | POA: Diagnosis not present

## 2014-06-26 DIAGNOSIS — Z792 Long term (current) use of antibiotics: Secondary | ICD-10-CM | POA: Insufficient documentation

## 2014-06-26 DIAGNOSIS — K219 Gastro-esophageal reflux disease without esophagitis: Secondary | ICD-10-CM | POA: Diagnosis not present

## 2014-06-26 DIAGNOSIS — J159 Unspecified bacterial pneumonia: Secondary | ICD-10-CM | POA: Diagnosis not present

## 2014-06-26 DIAGNOSIS — Z7982 Long term (current) use of aspirin: Secondary | ICD-10-CM | POA: Insufficient documentation

## 2014-06-26 DIAGNOSIS — R05 Cough: Secondary | ICD-10-CM | POA: Diagnosis present

## 2014-06-26 DIAGNOSIS — F419 Anxiety disorder, unspecified: Secondary | ICD-10-CM | POA: Diagnosis not present

## 2014-06-26 DIAGNOSIS — M1990 Unspecified osteoarthritis, unspecified site: Secondary | ICD-10-CM | POA: Diagnosis not present

## 2014-06-26 DIAGNOSIS — J189 Pneumonia, unspecified organism: Secondary | ICD-10-CM

## 2014-06-26 DIAGNOSIS — Z7951 Long term (current) use of inhaled steroids: Secondary | ICD-10-CM | POA: Insufficient documentation

## 2014-06-26 DIAGNOSIS — Z720 Tobacco use: Secondary | ICD-10-CM | POA: Diagnosis not present

## 2014-06-26 DIAGNOSIS — J449 Chronic obstructive pulmonary disease, unspecified: Secondary | ICD-10-CM | POA: Insufficient documentation

## 2014-06-26 DIAGNOSIS — Z8701 Personal history of pneumonia (recurrent): Secondary | ICD-10-CM | POA: Diagnosis not present

## 2014-06-26 DIAGNOSIS — Z86711 Personal history of pulmonary embolism: Secondary | ICD-10-CM | POA: Diagnosis not present

## 2014-06-26 LAB — CBC WITH DIFFERENTIAL/PLATELET
BASOS ABS: 0 10*3/uL (ref 0.0–0.1)
BASOS PCT: 0 % (ref 0–1)
EOS PCT: 6 % — AB (ref 0–5)
Eosinophils Absolute: 0.4 10*3/uL (ref 0.0–0.7)
HCT: 38.4 % — ABNORMAL LOW (ref 39.0–52.0)
HEMOGLOBIN: 12.7 g/dL — AB (ref 13.0–17.0)
Lymphocytes Relative: 25 % (ref 12–46)
Lymphs Abs: 1.5 10*3/uL (ref 0.7–4.0)
MCH: 30.8 pg (ref 26.0–34.0)
MCHC: 33.1 g/dL (ref 30.0–36.0)
MCV: 93 fL (ref 78.0–100.0)
MONO ABS: 0.5 10*3/uL (ref 0.1–1.0)
Monocytes Relative: 9 % (ref 3–12)
Neutro Abs: 3.5 10*3/uL (ref 1.7–7.7)
Neutrophils Relative %: 60 % (ref 43–77)
Platelets: 138 10*3/uL — ABNORMAL LOW (ref 150–400)
RBC: 4.13 MIL/uL — ABNORMAL LOW (ref 4.22–5.81)
RDW: 14.1 % (ref 11.5–15.5)
WBC: 5.9 10*3/uL (ref 4.0–10.5)

## 2014-06-26 LAB — COMPREHENSIVE METABOLIC PANEL
ALBUMIN: 3.3 g/dL — AB (ref 3.5–5.2)
ALK PHOS: 46 U/L (ref 39–117)
ALT: 52 U/L (ref 0–53)
AST: 44 U/L — AB (ref 0–37)
Anion gap: 5 (ref 5–15)
BUN: 10 mg/dL (ref 6–23)
CHLORIDE: 107 mmol/L (ref 96–112)
CO2: 26 mmol/L (ref 19–32)
Calcium: 8.4 mg/dL (ref 8.4–10.5)
Creatinine, Ser: 0.77 mg/dL (ref 0.50–1.35)
GFR calc Af Amer: >90 mL/min (ref >90)
GFR calc non Af Amer: >90 mL/min (ref >90)
Glucose, Bld: 102 mg/dL — ABNORMAL HIGH (ref 70–99)
POTASSIUM: 3.7 mmol/L (ref 3.5–5.1)
Sodium: 138 mmol/L (ref 135–145)
Total Bilirubin: 0.5 mg/dL (ref 0.3–1.2)
Total Protein: 7.3 g/dL (ref 6.0–8.3)

## 2014-06-26 MED ORDER — HYDROCOD POLST-CHLORPHEN POLST 10-8 MG/5ML PO LQCR: 5.0000 mL | Freq: Two times a day (BID) | ORAL | Status: DC

## 2014-06-26 MED ORDER — LEVOFLOXACIN 750 MG PO TABS: 750.0000 mg | ORAL_TABLET | Freq: Every day | ORAL | Status: DC

## 2014-06-26 NOTE — Discharge Instructions (Signed)

## 2014-06-26 NOTE — ED Provider Notes (Signed)
CSN: [id number]     Arrival date & time 06/26/14  1427 History  This chart was scribed for Eric BaleElliott Brycen Bean, MD by [name], ED Scribe. This patient was seen in room APA01/APA01 and the patient's care was started at 8:00 PM.   Chief Complaint  Patient presents with  . Cough   The history is provided by the patient. No language interpreter was used.    HPI Comments: [name] is a 54 y.o. male with a history of hepatitis C and chronic pain who presents to the Emergency Department complaining of intermittent, productive cough that started 4-6 days ago. He states chest pain that occurs with coughing and fever as associated symptoms. Pt reports diagnosis of pneumonia 3 weeks ago. He notes recurrence of pneumonia 3-4 times annually starting 13-14 years ago. Pt reports prior contract with a pain management clinic, but states he was dropped from his insurance. He notes that his hepatologist recommends oxycodone for his pain because of previous liver damage caused by hepatitis C. Pt smokes cigarettes.   Past Medical History  Diagnosis Date  . PE (pulmonary embolism)   . Degenerative disk disease   . Arthritis   . Family history of anesthesia complication     mother has N/V  . Anxiety   . GERD (gastroesophageal reflux disease)   . Headache(784.0)   . Pneumonia     has recurrent pneumonia, last ~1 year ago  . Asthma     has not had an attack in over a year  . COPD (chronic obstructive pulmonary disease)   . Hiatal hernia    Past Surgical History  Procedure Laterality Date  . Cholecystectomy    . Hernia repair      umbilical  . Closed reduction mandible with mandibuloma  10/21/2011    Procedure: CLOSED REDUCTION MANDIBLE WITH MANDIBULOMAXILLARY FUSION;  Surgeon: Darletta MollSui W Teoh, MD;  Location: [facility] OR;  Service: ENT;  Laterality: N/A;  MMF screws  . Orif mandibular fracture  10/21/2011    Procedure: OPEN REDUCTION INTERNAL FIXATION (ORIF) MANDIBULAR FRACTURE;  Surgeon: Darletta MollSui W Teoh, MD;   [facility];  Service: ENT;  Laterality: N/A;  . Hand surgery  20 years ago    right  . Mandibular hardware removal  11/11/2011    Procedure: MANDIBULAR HARDWARE REMOVAL;  Surgeon: Darletta MollSui W Teoh, MD;  Location: [facility];  Service: ENT;  Laterality: N/A;  . Leg surgery     Family History  Problem Relation Age of Onset  . Coronary artery disease Mother   . Coronary artery disease Father   . Diabetes Other    History  Substance Use Topics  . Smoking status: Current Every Day Smoker -- 0.50 packs/day for 35 years    Types: Cigarettes  . Smokeless tobacco: Never Used  . Alcohol Use: No    Review of Systems  Constitutional: Positive for fever.  Respiratory: Positive for cough.   Cardiovascular: Positive for chest pain.  All other systems reviewed and are negative.     Allergies  Review of patient's allergies indicates no known allergies.  Home Medications   Prior to Admission medications   Medication Sig Start Date End Date Taking? Authorizing Provider  albuterol (PROVENTIL HFA;VENTOLIN HFA) 108 (90 BASE) MCG/ACT inhaler Inhale 2 puffs into the lungs every 4 (four) hours as needed. FOR SHORTNESS OF BREATH    Historical Provider, MD  amoxicillin-clavulanate (AUGMENTIN) 875-125 MG per tablet Take 1 tablet by mouth every 12 (twelve)  hours. 06/01/14   Burgess Amor, PA-C  aspirin EC 81 MG tablet Take 81 mg by mouth daily.    Historical Provider, MD  budesonide-formoterol (SYMBICORT) 160-4.5 MCG/ACT inhaler Inhale 2 puffs into the lungs 2 (two) times daily. 08/19/13   Vassie Loll, MD  chlorpheniramine-HYDROcodone [facility] ER) 10-8 MG/5ML LQCR Take 5 mLs by mouth 2 (two) times daily. 06/26/14   Eric Bale, MD  Cholecalciferol (VITAMIN D) 2000 UNITS tablet Take 2,000 Units by mouth daily.     Historical Provider, MD  citalopram (CELEXA) 40 MG tablet Take 40 mg by mouth daily.    Historical Provider, MD  clonazePAM (KLONOPIN) 1 MG tablet Take 0.5-1 mg by  mouth 4 (four) times daily.     Historical Provider, MD  Coenzyme Q10 (CO Q 10) 10 MG CAPS Take 1 capsule by mouth daily.    Historical Provider, MD  cyclobenzaprine (FLEXERIL) 10 MG tablet Take 1 tablet by mouth 3 (three) times daily as needed for muscle spasms.  08/10/13   Historical Provider, MD  gabapentin (NEURONTIN) 600 MG tablet Take 600 mg by mouth 3 (three) times daily.    Historical Provider, MD  levofloxacin (LEVAQUIN) 750 MG tablet Take 1 tablet (750 mg total) by mouth daily. X 7 days 06/26/14   Eric Bale, MD  MILK THISTLE EXTRACT PO Take 3,000 mg by mouth 2 (two) times daily. 1000 mg capsules 3 times daily    Historical Provider, MD  ondansetron (ZOFRAN ODT) 4 MG disintegrating tablet  ODT q6 hours prn nausea/vomit 10/26/12   Bethann Berkshire, MD  Oxycodone HCl 10 MG TABS Take 1 tablet (10 mg total) by mouth 4 (four) times daily as needed. 12/12/11   Carleene Cooper, MD  pantoprazole (PROTONIX) 40 MG tablet Take 40 mg by mouth daily.    Historical Provider, MD  QUEtiapine (SEROQUEL) 300 MG tablet Take 1 tablet by mouth daily. 08/04/13   Historical Provider, MD  risperiDONE (RISPERDAL) 2 MG tablet Take 2 mg by mouth at bedtime.    Historical Provider, MD  Rivaroxaban (XARELTO) 20 MG TABS Take 20 mg by mouth daily.    Historical Provider, MD  tiotropium (SPIRIVA) 18 MCG inhalation capsule Place 18 mcg into inhaler and inhale.    Historical Provider, MD  vitamin E (VITAMIN E) 400 UNIT capsule Take 400 Units by mouth daily.     Historical Provider, MD   BP 102/80 mmHg  Pulse 76  Temp(Src) 98 F (36.7 C) (Oral)  Resp 18  Ht  (1.93 m)  Wt 230 lb (104.327 kg)  BMI 28.01 kg/m2  SpO2 95% Physical Exam  Constitutional: He is oriented to person, place, and time. He appears well-developed and well-nourished.  HENT:  Head: Normocephalic and atraumatic.  Right Ear: External ear normal.  Left Ear: External ear normal.  Eyes: Conjunctivae and EOM are normal. Pupils are equal, round, and  reactive to light.  Neck: Normal range of motion and phonation normal. Neck supple.  Cardiovascular: Normal rate, regular rhythm and normal heart sounds.   Pulmonary/Chest: Effort normal. He has no wheezes. He has no rales. He exhibits no bony tenderness.  Decreased breath sounds bilaterally with rhonchi; no rales; no wheezes  Abdominal: Soft. There is no tenderness.  Musculoskeletal: Normal range of motion.  Neurological: He is alert and oriented to person, place, and time. No cranial nerve deficit or sensory deficit. He exhibits normal muscle tone. Coordination normal.  Skin: Skin is warm, dry and intact.  Psychiatric: He has  a normal mood and affect. His behavior is normal. Judgment and thought content normal.  Nursing note and vitals reviewed.   ED Course  Procedures   DIAGNOSTIC STUDIES: Oxygen Saturation is 92% on RA, low by my interpretation.    COORDINATION OF CARE: 8:05 PM Discussed treatment plan with pt at bedside and pt agreed to plan.  Medications - No data to display  Patient Vitals for the past 24 hrs:  BP Temp Temp src Pulse Resp SpO2 Height Weight  06/26/14 2017 102/80 mmHg 98 F (36.7 C) Oral 76 18 95 % - -  06/26/14 1856 113/68 mmHg 97.8 F (36.6 C) Oral 66 16 97 % - -  06/26/14 1837 99/74 mmHg - - 68 18 92 % - -  06/26/14 1443 103/68 mmHg 98.6 F (37 C) Oral 84 20 99 %  (1.93 m) 230 lb (104.327 kg)   Review of the [place] Controlled substance reporting system data bank indicates that his only recent narcotic prescription was 06/01/2014, consisting of hydrocodone elixir 75 cc. Otherwise, in the last 6 months. He had a prescription for dextroamphetamine, on 03/10/2014.  Review of the [place] PMP, controlled substance Databank indicates that he receives regular prescriptions for clonazepam, every month, 120 tablets, last was 06/07/2014    Labs Review Labs Reviewed  CBC WITH DIFFERENTIAL/PLATELET - Abnormal; Notable for the following:    RBC  4.13 (*)    Hemoglobin 12.7 (*)    HCT 38.4 (*)    Platelets 138 (*)    Eosinophils Relative 6 (*)    All other components within normal limits  COMPREHENSIVE METABOLIC PANEL - Abnormal; Notable for the following:    Glucose, Bld 102 (*)    Albumin 3.3 (*)    AST 44 (*)    All other components within normal limits    Imaging Review Dg Chest 2 View  06/26/2014   CLINICAL DATA:  Pneumonia diagnosed 3 weeks ago with 4-5 days of cough and recurrent fever. Recent diagnosis of pneumonia.  EXAM: CHEST  2 VIEW  COMPARISON:  06/01/2014  FINDINGS: An opacity in the posterior right mid lung has decreased in size, but has more circumscribed margins. The residual opacity measures approximately 6 cm. No mass or endobronchial lesion was seen on chest CT 11/06/2013.  There is new prominent markings at the left lung base. No edema, effusion, or pneumothorax. Normal heart size and aortic contours.  IMPRESSION: Decreased but still extensive opacity in the right lower lobe, favor pneumonia with poor treatment response. Strict short interval imaging follow-up is recommended to exclude a mass lesion (although considered unlikely given negative chest CT 11/06/2013).   Electronically Signed   By: Marnee Spring M.D.   On: 06/26/2014 15:20     EKG Interpretation None      MDM   Final diagnoses:  Community acquired pneumonia    Persistent pneumonia, failed treatment with Augmentin. Nontoxic patient, without evidence for severe spectral infection, metabolic instability or suggestion of impending vascular collapse. The patient has chronic lower back pain, and is not currently receiving ongoing treatment with chronic pain medications. Will begin a short course of a antitussive narcotic medication.  Nursing Notes Reviewed/ Care Coordinated Applicable Imaging Reviewed Interpretation of Laboratory Data incorporated into ED treatment  The patient appears reasonably screened and/or stabilized for discharge and I  doubt any other medical condition or other [facility] requiring further screening, evaluation, or treatment in the ED at this time prior to discharge.  Plan: Home  Medications-Hydrocodone elixer for cough,  Levaquin; Home Treatments- rest; return here if the recommended treatment, does not improve the symptoms; Recommended follow up- PCP 1 week     I personally performed the services described in this documentation, which was scribed in my presence. The recorded information has been reviewed and is accurate.     Eric Bale, MD 06/27/14 [phone]

## 2014-06-26 NOTE — ED Notes (Signed)
Pt reports history of pneumonia, reports approx 3 weeks ago was diagnosed with pneumonia.  Pt reports 4 or 5 days ago started having cough and fever again.

## 2014-12-05 ENCOUNTER — Emergency Department (HOSPITAL_COMMUNITY)
Admission: EM | Admit: 2014-12-05 | Discharge: 2014-12-05 | Disposition: A | Discharge status: Home / Self Care | Payer: Medicare HMO | Attending: Emergency Medicine | Admitting: Emergency Medicine

## 2014-12-05 ENCOUNTER — Encounter (HOSPITAL_COMMUNITY): Payer: Self-pay | Admitting: *Deleted

## 2014-12-05 DIAGNOSIS — Z86711 Personal history of pulmonary embolism: Secondary | ICD-10-CM | POA: Diagnosis not present

## 2014-12-05 DIAGNOSIS — Z720 Tobacco use: Secondary | ICD-10-CM | POA: Diagnosis not present

## 2014-12-05 DIAGNOSIS — L237 Allergic contact dermatitis due to plants, except food: Secondary | ICD-10-CM | POA: Insufficient documentation

## 2014-12-05 DIAGNOSIS — Z8701 Personal history of pneumonia (recurrent): Secondary | ICD-10-CM | POA: Diagnosis not present

## 2014-12-05 DIAGNOSIS — F419 Anxiety disorder, unspecified: Secondary | ICD-10-CM | POA: Insufficient documentation

## 2014-12-05 DIAGNOSIS — J449 Chronic obstructive pulmonary disease, unspecified: Secondary | ICD-10-CM | POA: Insufficient documentation

## 2014-12-05 DIAGNOSIS — Z8719 Personal history of other diseases of the digestive system: Secondary | ICD-10-CM | POA: Diagnosis not present

## 2014-12-05 DIAGNOSIS — R21 Rash and other nonspecific skin eruption: Secondary | ICD-10-CM | POA: Diagnosis present

## 2014-12-05 DIAGNOSIS — Z79899 Other long term (current) drug therapy: Secondary | ICD-10-CM | POA: Diagnosis not present

## 2014-12-05 DIAGNOSIS — Z8739 Personal history of other diseases of the musculoskeletal system and connective tissue: Secondary | ICD-10-CM | POA: Diagnosis not present

## 2014-12-05 MED ORDER — METHYLPREDNISOLONE SODIUM SUCC 125 MG IJ SOLR
125.0000 mg | Freq: Once | INTRAMUSCULAR | Status: AC
  Administered 2014-12-05: 125 mg via INTRAMUSCULAR
  Filled 2014-12-05: qty 2

## 2014-12-05 MED ORDER — PREDNISONE 10 MG PO TABS: ORAL_TABLET | ORAL | Status: DC

## 2014-12-05 NOTE — ED Notes (Signed)
Pt states he cut down a tree that had poison oak on it Saturday and got a rash soon after that. Pt has history of the same states he is allergic. Pt has calamine lotion on bilateral arms, pt states rash is beneath this. NAD noted.

## 2014-12-05 NOTE — ED Provider Notes (Signed)
CSN: [id number]     Arrival date & time 12/05/14  1222 History   First MD Initiated Contact with Patient 12/05/14 1327     Chief Complaint  Patient presents with  . Rash     (Consider location/radiation/quality/duration/timing/severity/associated sxs/prior Treatment) Patient is a 54 y.o. male presenting with rash. The history is provided by the patient. No language interpreter was used.  Rash Location:  Shoulder/arm Shoulder/arm rash location:  L shoulder, R shoulder, L arm and R arm Quality: blistering and itchiness   Onset quality:  Sudden Timing:  Constant Progression:  Worsening Chronicity:  New Relieved by:  Nothing Worsened by:  Nothing tried Ineffective treatments:  None tried Associated symptoms: no nausea     Past Medical History  Diagnosis Date  . PE (pulmonary embolism)   . Degenerative disk disease   . Arthritis   . Family history of anesthesia complication     mother has N/V  . Anxiety   . GERD (gastroesophageal reflux disease)   . Headache(784.0)   . Pneumonia     has recurrent pneumonia, last ~1 year ago  . Asthma     has not had an attack in over a year  . COPD (chronic obstructive pulmonary disease)   . Hiatal hernia    Past Surgical History  Procedure Laterality Date  . Cholecystectomy    . Hernia repair      umbilical  . Closed reduction mandible with mandibuloma  10/21/2011    Procedure: CLOSED REDUCTION MANDIBLE WITH MANDIBULOMAXILLARY FUSION;  Surgeon: Darletta Moll, MD;  Location: [facility] OR;  Service: ENT;  Laterality: N/A;  MMF screws  . Orif mandibular fracture  10/21/2011    Procedure: OPEN REDUCTION INTERNAL FIXATION (ORIF) MANDIBULAR FRACTURE;  Surgeon: Darletta Moll, MD;  Location: [facility] OR;  Service: ENT;  Laterality: N/A;  . Hand surgery  20 years ago    right  . Mandibular hardware removal  11/11/2011    Procedure: MANDIBULAR HARDWARE REMOVAL;  Surgeon: Darletta Moll, MD;  Location: [facility];  Service: ENT;  Laterality: N/A;  . Leg  surgery     Family History  Problem Relation Age of Onset  . Coronary artery disease Mother   . Coronary artery disease Father   . Diabetes Other    Social History  Substance Use Topics  . Smoking status: Current Every Day Smoker -- 0.50 packs/day for 35 years    Types: Cigarettes  . Smokeless tobacco: Never Used  . Alcohol Use: No    Review of Systems  Gastrointestinal: Negative for nausea.  Skin: Positive for rash.  All other systems reviewed and are negative.     Allergies  Review of patient's allergies indicates no known allergies.  Home Medications   Prior to Admission medications   Medication Sig Start Date End Date Taking? Authorizing Provider  albuterol (PROVENTIL HFA;VENTOLIN HFA) 108 (90 BASE) MCG/ACT inhaler Inhale 2 puffs into the lungs every 4 (four) hours as needed. FOR SHORTNESS OF BREATH   Yes Historical Provider, MD  budesonide-formoterol (SYMBICORT) 160-4.5 MCG/ACT inhaler Inhale 2 puffs into the lungs 2 (two) times daily. 08/19/13  Yes Vassie Loll, MD  Cholecalciferol (VITAMIN D) 2000 UNITS tablet Take 2,000 Units by mouth daily.    Yes Historical Provider, MD  citalopram (CELEXA) 40 MG tablet Take 40 mg by mouth daily.   Yes Historical Provider, MD  clonazePAM (KLONOPIN) 1 MG tablet Take 0.5-1 mg by mouth 4 (four) times daily.  Yes Historical Provider, MD  Coenzyme Q10 (CO Q 10) 10 MG CAPS Take 1 capsule by mouth daily.   Yes Historical Provider, MD  gabapentin (NEURONTIN) 600 MG tablet Take 600 mg by mouth 3 (three) times daily.   Yes Historical Provider, MD  MILK THISTLE EXTRACT PO Take 3,000 mg by mouth 2 (two) times daily. 1000 mg capsules 3 times daily   Yes Historical Provider, MD  ondansetron (ZOFRAN ODT) 4 MG disintegrating tablet 4mg  ODT q6 hours prn nausea/vomit 10/26/12  Yes Bethann Berkshire, MD  Oxycodone HCl 10 MG TABS Take 1 tablet (10 mg total) by mouth 4 (four) times daily as needed. 12/12/11  Yes Carleene Cooper, MD  QUEtiapine (SEROQUEL) 300  MG tablet Take 1 tablet by mouth daily. 08/04/13  Yes Historical Provider, MD  risperiDONE (RISPERDAL) 2 MG tablet Take 2 mg by mouth at bedtime.   Yes Historical Provider, MD  Rivaroxaban (XARELTO) 20 MG TABS Take 20 mg by mouth daily.   Yes Historical Provider, MD  vitamin E (VITAMIN E) 400 UNIT capsule Take 400 Units by mouth daily.    Yes Historical Provider, MD  amoxicillin-clavulanate (AUGMENTIN) 875-125 MG per tablet Take 1 tablet by mouth every 12 (twelve) hours. Patient not taking: Reported on 12/05/2014 06/01/14   Burgess Amor, PA-C  chlorpheniramine-HYDROcodone [facility]) 10-8 MG/5ML LQCR Take 5 mLs by mouth 2 (two) times daily. Patient not taking: Reported on 12/05/2014 06/26/14   Mancel Bale, MD  levofloxacin (LEVAQUIN) 750 MG tablet Take 1 tablet (750 mg total) by mouth daily. X 7 days Patient not taking: Reported on 12/05/2014 06/26/14   Mancel Bale, MD  predniSONE (DELTASONE) 10 MG tablet 6,6,5,5,4,4,3,3,2,2,1,1 taper 12/05/14   Elson Areas, PA-C  tiotropium [facility]) 18 MCG inhalation capsule Place 18 mcg into inhaler and inhale.    Historical Provider, MD   BP 110/62 mmHg  Pulse 75  Temp(Src) 98.3 F (36.8 C) (Oral)  Resp 18  Ht 6\' 4"  (1.93 m)  Wt 215 lb (97.523 kg)  BMI 26.18 kg/m2  SpO2 99% Physical Exam  Constitutional: He is oriented to person, place, and time. He appears well-developed and well-nourished.  HENT:  Head: Normocephalic and atraumatic.  Right Ear: External ear normal.  Left Ear: External ear normal.  Eyes: Conjunctivae and EOM are normal. Pupils are equal, round, and reactive to light.  Neck: Normal range of motion.  Cardiovascular: Normal rate and normal heart sounds.   Pulmonary/Chest: Effort normal.  Abdominal: Soft. He exhibits no distension.  Musculoskeletal: Normal range of motion.  Neurological: He is alert and oriented to person, place, and time.  Psychiatric: He has a normal mood and affect.  Nursing note and vitals  reviewed.   ED Course  Procedures (including critical care time) Labs Review Labs Reviewed - No data to display  Imaging Review No results found. I have personally reviewed and evaluated these images and lab results as part of my medical decision-making.   EKG Interpretation None      MDM  Pt given solumedrol IM  Rx for prednisone taper   Final diagnoses:  Poison ivy    Taper Benadryl Return if any problems.    Lonia Skinner Pillager, PA-C 12/05/14 1437  Mancel Bale, MD 12/05/14 1455

## 2014-12-05 NOTE — Discharge Instructions (Signed)

## 2015-02-05 ENCOUNTER — Emergency Department (HOSPITAL_COMMUNITY): Payer: Medicare HMO

## 2015-02-05 ENCOUNTER — Encounter (HOSPITAL_COMMUNITY): Payer: Self-pay | Admitting: Emergency Medicine

## 2015-02-05 ENCOUNTER — Inpatient Hospital Stay (HOSPITAL_COMMUNITY)
Admission: EM | Admit: 2015-02-05 | Discharge: 2015-02-08 | DRG: 193 | Disposition: A | Discharge status: Home / Self Care | Payer: Medicare HMO | Attending: Family Medicine | Admitting: Family Medicine

## 2015-02-05 DIAGNOSIS — F419 Anxiety disorder, unspecified: Secondary | ICD-10-CM | POA: Diagnosis present

## 2015-02-05 DIAGNOSIS — G894 Chronic pain syndrome: Secondary | ICD-10-CM | POA: Diagnosis present

## 2015-02-05 DIAGNOSIS — Z7901 Long term (current) use of anticoagulants: Secondary | ICD-10-CM

## 2015-02-05 DIAGNOSIS — F17210 Nicotine dependence, cigarettes, uncomplicated: Secondary | ICD-10-CM | POA: Diagnosis present

## 2015-02-05 DIAGNOSIS — J449 Chronic obstructive pulmonary disease, unspecified: Secondary | ICD-10-CM | POA: Diagnosis present

## 2015-02-05 DIAGNOSIS — R059 Cough, unspecified: Secondary | ICD-10-CM

## 2015-02-05 DIAGNOSIS — F1120 Opioid dependence, uncomplicated: Secondary | ICD-10-CM | POA: Diagnosis present

## 2015-02-05 DIAGNOSIS — F1420 Cocaine dependence, uncomplicated: Secondary | ICD-10-CM | POA: Diagnosis present

## 2015-02-05 DIAGNOSIS — R509 Fever, unspecified: Secondary | ICD-10-CM

## 2015-02-05 DIAGNOSIS — J9601 Acute respiratory failure with hypoxia: Secondary | ICD-10-CM | POA: Diagnosis not present

## 2015-02-05 DIAGNOSIS — J441 Chronic obstructive pulmonary disease with (acute) exacerbation: Secondary | ICD-10-CM | POA: Diagnosis present

## 2015-02-05 DIAGNOSIS — B1920 Unspecified viral hepatitis C without hepatic coma: Secondary | ICD-10-CM

## 2015-02-05 DIAGNOSIS — R05 Cough: Secondary | ICD-10-CM

## 2015-02-05 DIAGNOSIS — F1920 Other psychoactive substance dependence, uncomplicated: Secondary | ICD-10-CM

## 2015-02-05 DIAGNOSIS — Z86711 Personal history of pulmonary embolism: Secondary | ICD-10-CM

## 2015-02-05 DIAGNOSIS — J189 Pneumonia, unspecified organism: Secondary | ICD-10-CM | POA: Diagnosis not present

## 2015-02-05 DIAGNOSIS — B1910 Unspecified viral hepatitis B without hepatic coma: Secondary | ICD-10-CM | POA: Diagnosis present

## 2015-02-05 DIAGNOSIS — R0602 Shortness of breath: Secondary | ICD-10-CM

## 2015-02-05 LAB — URINALYSIS, ROUTINE W REFLEX MICROSCOPIC
GLUCOSE, UA: NEGATIVE mg/dL
KETONES UR: 15 mg/dL — AB
LEUKOCYTES UA: NEGATIVE
Nitrite: NEGATIVE
PH: 6 (ref 5.0–8.0)
Protein, ur: 30 mg/dL — AB
SPECIFIC GRAVITY, URINE: 1.025 (ref 1.005–1.030)
Urobilinogen, UA: 1 mg/dL (ref 0.0–1.0)

## 2015-02-05 LAB — RAPID URINE DRUG SCREEN, HOSP PERFORMED
AMPHETAMINES: NOT DETECTED
BARBITURATES: POSITIVE — AB
Benzodiazepines: POSITIVE — AB
COCAINE: POSITIVE — AB
Opiates: POSITIVE — AB
TETRAHYDROCANNABINOL: NOT DETECTED

## 2015-02-05 LAB — ACETAMINOPHEN LEVEL: Acetaminophen (Tylenol), Serum: <10 ug/mL — ABNORMAL LOW (ref 10–30)

## 2015-02-05 LAB — BLOOD GAS, VENOUS
Acid-Base Excess: 2.8 mmol/L — ABNORMAL HIGH (ref 0.0–2.0)
Bicarbonate: 27 mEq/L — ABNORMAL HIGH (ref 20.0–24.0)
O2 Content: 100 L/min
O2 Saturation: 99.2 %
PCO2 VEN: 39.7 mmHg — AB (ref 45.0–50.0)
PH VEN: 7.442 — AB (ref 7.250–7.300)
PO2 VEN: 192 mmHg — AB (ref 30.0–45.0)

## 2015-02-05 LAB — COMPREHENSIVE METABOLIC PANEL
ALBUMIN: 3.7 g/dL (ref 3.5–5.0)
ALT: 17 U/L (ref 17–63)
AST: 35 U/L (ref 15–41)
Alkaline Phosphatase: 47 U/L (ref 38–126)
Anion gap: 9 (ref 5–15)
BUN: 17 mg/dL (ref 6–20)
CHLORIDE: 103 mmol/L (ref 101–111)
CO2: 27 mmol/L (ref 22–32)
Calcium: 9.2 mg/dL (ref 8.9–10.3)
Creatinine, Ser: 0.99 mg/dL (ref 0.61–1.24)
GFR calc Af Amer: >60 mL/min (ref >60)
GFR calc non Af Amer: >60 mL/min (ref >60)
Glucose, Bld: 107 mg/dL — ABNORMAL HIGH (ref 65–99)
POTASSIUM: 3.4 mmol/L — AB (ref 3.5–5.1)
Sodium: 139 mmol/L (ref 135–145)
Total Bilirubin: 1.3 mg/dL — ABNORMAL HIGH (ref 0.3–1.2)
Total Protein: 7.3 g/dL (ref 6.5–8.1)

## 2015-02-05 LAB — CBC WITH DIFFERENTIAL/PLATELET
Basophils Absolute: 0 10*3/uL (ref 0.0–0.1)
Basophils Relative: 0 %
Eosinophils Absolute: 0 10*3/uL (ref 0.0–0.7)
Eosinophils Relative: 1 %
HCT: 41.1 % (ref 39.0–52.0)
HEMOGLOBIN: 13.8 g/dL (ref 13.0–17.0)
LYMPHS ABS: 1 10*3/uL (ref 0.7–4.0)
LYMPHS PCT: 14 %
MCH: 31.6 pg (ref 26.0–34.0)
MCHC: 33.6 g/dL (ref 30.0–36.0)
MCV: 94.1 fL (ref 78.0–100.0)
Monocytes Absolute: 0.5 10*3/uL (ref 0.1–1.0)
Monocytes Relative: 8 %
Neutro Abs: 5.4 10*3/uL (ref 1.7–7.7)
Neutrophils Relative %: 77 %
Platelets: 131 10*3/uL — ABNORMAL LOW (ref 150–400)
RBC: 4.37 MIL/uL (ref 4.22–5.81)
RDW: 13.6 % (ref 11.5–15.5)
WBC: 6.9 10*3/uL (ref 4.0–10.5)

## 2015-02-05 LAB — URINE MICROSCOPIC-ADD ON

## 2015-02-05 LAB — ETHANOL: Alcohol, Ethyl (B): <5 mg/dL (ref <5)

## 2015-02-05 LAB — I-STAT CG4 LACTIC ACID, ED: Lactic Acid, Venous: 1.28 mmol/L (ref 0.5–2.0)

## 2015-02-05 MED ORDER — SODIUM CHLORIDE 0.9 % IV BOLUS (SEPSIS)
1000.0000 mL | Freq: Once | INTRAVENOUS | Status: AC
  Administered 2015-02-05: 1000 mL via INTRAVENOUS

## 2015-02-05 MED ORDER — METHYLPREDNISOLONE SODIUM SUCC 125 MG IJ SOLR
60.0000 mg | Freq: Two times a day (BID) | INTRAMUSCULAR | Status: DC
  Administered 2015-02-06 (×2): 60 mg via INTRAVENOUS
  Filled 2015-02-05 (×2): qty 2

## 2015-02-05 MED ORDER — SOFOSBUVIR-VELPATASVIR 400-100 MG PO TABS
1.0000 | ORAL_TABLET | Freq: Every day | ORAL | Status: DC
  Administered 2015-02-07 – 2015-02-08 (×2): 1 via ORAL
  Filled 2015-02-05 (×4): qty 1

## 2015-02-05 MED ORDER — CLONAZEPAM 0.5 MG PO TABS
0.5000 mg | ORAL_TABLET | Freq: Four times a day (QID) | ORAL | Status: DC
  Administered 2015-02-06: 0.5 mg via ORAL
  Filled 2015-02-05 (×2): qty 1

## 2015-02-05 MED ORDER — OXYCODONE HCL 10 MG PO TABS: 10.0000 mg | ORAL_TABLET | Freq: Four times a day (QID) | ORAL | Status: DC | PRN

## 2015-02-05 MED ORDER — POTASSIUM CHLORIDE IN NACL 20-0.9 MEQ/L-% IV SOLN
INTRAVENOUS | Status: DC
  Administered 2015-02-06 – 2015-02-07 (×4): via INTRAVENOUS

## 2015-02-05 MED ORDER — CLONAZEPAM 0.5 MG PO TABS: 0.5000 mg | ORAL_TABLET | Freq: Four times a day (QID) | ORAL | Status: DC

## 2015-02-05 MED ORDER — ONDANSETRON HCL 4 MG/2ML IJ SOLN: 4.0000 mg | Freq: Four times a day (QID) | INTRAMUSCULAR | Status: DC | PRN

## 2015-02-05 MED ORDER — ACETAMINOPHEN 650 MG RE SUPP
650.0000 mg | Freq: Once | RECTAL | Status: AC
  Administered 2015-02-05: 650 mg via RECTAL
  Filled 2015-02-05: qty 1

## 2015-02-05 MED ORDER — ONDANSETRON HCL 4 MG PO TABS
4.0000 mg | ORAL_TABLET | Freq: Four times a day (QID) | ORAL | Status: DC | PRN
  Administered 2015-02-08: 4 mg via ORAL
  Filled 2015-02-05: qty 1

## 2015-02-05 MED ORDER — DEXTROSE 5 % IV SOLN: 1.0000 g | INTRAVENOUS | Status: DC

## 2015-02-05 MED ORDER — MUPIROCIN 2 % EX OINT
1.0000 "application " | TOPICAL_OINTMENT | Freq: Two times a day (BID) | CUTANEOUS | Status: DC
  Administered 2015-02-06: 1 via NASAL
  Filled 2015-02-05: qty 22

## 2015-02-05 MED ORDER — QUETIAPINE FUMARATE 100 MG PO TABS
300.0000 mg | ORAL_TABLET | Freq: Every day | ORAL | Status: DC
  Administered 2015-02-06 – 2015-02-08 (×3): 300 mg via ORAL
  Filled 2015-02-05 (×3): qty 3

## 2015-02-05 MED ORDER — CEFTRIAXONE SODIUM 1 G IJ SOLR
1.0000 g | INTRAMUSCULAR | Status: DC
Start: 2015-02-06 — End: 2015-02-08
  Administered 2015-02-06 – 2015-02-07 (×2): 1 g via INTRAVENOUS
  Filled 2015-02-05 (×3): qty 10

## 2015-02-05 MED ORDER — RIBAVIRIN 200 MG PO CAPS
600.0000 mg | ORAL_CAPSULE | Freq: Two times a day (BID) | ORAL | Status: DC
  Administered 2015-02-06 – 2015-02-08 (×6): 600 mg via ORAL
  Filled 2015-02-05 (×8): qty 3

## 2015-02-05 MED ORDER — OXYCODONE HCL 5 MG PO TABS: 10.0000 mg | ORAL_TABLET | Freq: Three times a day (TID) | ORAL | Status: DC | PRN

## 2015-02-05 MED ORDER — DEXTROSE 5 % IV SOLN
500.0000 mg | INTRAVENOUS | Status: DC
  Administered 2015-02-05 – 2015-02-07 (×3): 500 mg via INTRAVENOUS
  Filled 2015-02-05 (×4): qty 500

## 2015-02-05 MED ORDER — CHLORHEXIDINE GLUCONATE CLOTH 2 % EX PADS: 6.0000 | MEDICATED_PAD | Freq: Every day | CUTANEOUS | Status: DC

## 2015-02-05 MED ORDER — RISPERIDONE 1 MG PO TABS
1.0000 mg | ORAL_TABLET | Freq: Every day | ORAL | Status: DC
  Administered 2015-02-06 – 2015-02-08 (×3): 1 mg via ORAL
  Filled 2015-02-05 (×3): qty 1

## 2015-02-05 MED ORDER — BUDESONIDE-FORMOTEROL FUMARATE 160-4.5 MCG/ACT IN AERO
2.0000 | INHALATION_SPRAY | Freq: Two times a day (BID) | RESPIRATORY_TRACT | Status: DC
  Administered 2015-02-06: 2 via RESPIRATORY_TRACT
  Filled 2015-02-05: qty 6

## 2015-02-05 MED ORDER — RIVAROXABAN 20 MG PO TABS
20.0000 mg | ORAL_TABLET | Freq: Every day | ORAL | Status: DC
  Administered 2015-02-06 – 2015-02-08 (×3): 20 mg via ORAL
  Filled 2015-02-05 (×3): qty 1

## 2015-02-05 MED ORDER — CITALOPRAM HYDROBROMIDE 20 MG PO TABS
40.0000 mg | ORAL_TABLET | Freq: Every day | ORAL | Status: DC
  Administered 2015-02-06 – 2015-02-08 (×3): 40 mg via ORAL
  Filled 2015-02-05 (×2): qty 2
  Filled 2015-02-05 (×3): qty 1

## 2015-02-05 MED ORDER — SODIUM CHLORIDE 0.9 % IJ SOLN
3.0000 mL | Freq: Two times a day (BID) | INTRAMUSCULAR | Status: DC
  Administered 2015-02-05 – 2015-02-08 (×4): 3 mL via INTRAVENOUS

## 2015-02-05 MED ORDER — PREGABALIN 75 MG PO CAPS
300.0000 mg | ORAL_CAPSULE | Freq: Two times a day (BID) | ORAL | Status: DC
  Administered 2015-02-06 – 2015-02-08 (×6): 300 mg via ORAL
  Filled 2015-02-05 (×7): qty 4

## 2015-02-05 MED ORDER — DEXTROSE 5 % IV SOLN
1.0000 g | Freq: Once | INTRAVENOUS | Status: AC
  Administered 2015-02-05: 1 g via INTRAVENOUS
  Filled 2015-02-05: qty 10

## 2015-02-05 NOTE — H&P (Signed)
[facility] History and Physical  ANJEL PARDO STM:[id number] DOB: 1960/12/06 DOA: 02/05/2015  Referring physician: ER PCP: [name], MD   Chief Complaint: Productive cough with fever.  HPI: [name] is a 54 y.o. male  This is a 54 year old man who has been complaining of productive cough of yellow sputum associated with fever and today apparently had altered mental status. When he presented to the emergency room he was somewhat somnolescent but he has improved significantly. He apparently required nonrebreather to maintain saturations but since he is more awake now, he feels improved and he is requiring only nasal cannula oxygen now. He is able to answer all my questions once I have his attention. He is aware of the day, month and year. Urine drug screen is positive for barbiturates, benzodiazepines, opiates and cocaine. Chest x-ray shows the possibility of atypical infection versus bronchitis. There is no localized pneumonia. He is now being admitted for further management.   Review of Systems:  Apart from symptoms above, all systems are negative.  Past Medical History  Diagnosis Date  . PE (pulmonary embolism)   . Degenerative disk disease   . Arthritis   . Family history of anesthesia complication     mother has N/V  . Anxiety   . GERD (gastroesophageal reflux disease)   . Headache(784.0)   . Pneumonia     has recurrent pneumonia, last ~1 year ago  . Asthma     has not had an attack in over a year  . COPD (chronic obstructive pulmonary disease) (HCC)   . Hiatal hernia    Past Surgical History  Procedure Laterality Date  . Cholecystectomy    . Hernia repair      umbilical  . Closed reduction mandible with mandibuloma  10/21/2011    Procedure: CLOSED REDUCTION MANDIBLE WITH MANDIBULOMAXILLARY FUSION;  Surgeon: Darletta Moll, MD;  Location: [facility] OR;  Service: ENT;  Laterality: N/A;  MMF screws  . Orif mandibular fracture  10/21/2011    Procedure: OPEN REDUCTION  INTERNAL FIXATION (ORIF) MANDIBULAR FRACTURE;  Surgeon: Darletta Moll, MD;  Location: [facility] OR;  Service: ENT;  Laterality: N/A;  . Hand surgery  20 years ago    right  . Mandibular hardware removal  11/11/2011    Procedure: MANDIBULAR HARDWARE REMOVAL;  Surgeon: Darletta Moll, MD;  Location: [facility];  Service: ENT;  Laterality: N/A;  . Leg surgery     Social History:  reports that he has been smoking Cigarettes.  He has a 17.5 pack-year smoking history. He has never used smokeless tobacco. He reports that he does not drink alcohol or use illicit drugs.  No Known Allergies  Family History  Problem Relation Age of Onset  . Coronary artery disease Mother   . Coronary artery disease Father   . Diabetes Other     Prior to Admission medications   Medication Sig Start Date End Date Taking? Authorizing Provider  albuterol (PROVENTIL HFA;VENTOLIN HFA) 108 (90 BASE) MCG/ACT inhaler Inhale 2 puffs into the lungs every 4 (four) hours as needed. FOR SHORTNESS OF BREATH   Yes Historical Provider, MD  budesonide-formoterol (SYMBICORT) 160-4.5 MCG/ACT inhaler Inhale 2 puffs into the lungs 2 (two) times daily. 08/19/13  Yes Vassie Loll, MD  citalopram (CELEXA) 40 MG tablet Take 40 mg by mouth daily.   Yes Historical Provider, MD  clonazePAM (KLONOPIN) 1 MG tablet Take 0.5-1 mg by mouth 4 (four) times daily.    Yes Historical  Provider, MD  LYRICA 300 MG capsule Take 300 mg by mouth 2 (two) times daily. 01/31/15  Yes Historical Provider, MD  ondansetron (ZOFRAN) 8 MG tablet Take 8 mg by mouth every 6 (six) hours as needed for vomiting.  01/04/15  Yes Historical Provider, MD  Oxycodone HCl 10 MG TABS Take 1 tablet (10 mg total) by mouth 4 (four) times daily as needed. Patient taking differently: Take 10 mg by mouth 4 (four) times daily as needed (for pain).  12/12/11  Yes Carleene CooperAlan Davidson, MD  predniSONE (DELTASONE) 20 MG tablet Take 20 mg by mouth See admin instructions. Starting on 01/31/2015 take one  tablet three times daily for 5 days, then take one tablet twice daily for 5 days then take one tablet daily for 5 days 01/31/15  Yes Historical Provider, MD  QUEtiapine (SEROQUEL) 300 MG tablet Take 1 tablet by mouth at bedtime.  08/04/13  Yes Historical Provider, MD  ribavirin (COPEGUS) 200 MG tablet Take 600 mg by mouth 2 (two) times daily. 01/16/15 02/13/15 Yes Historical Provider, MD  risperiDONE (RISPERDAL) 1 MG tablet Take 1 mg by mouth at bedtime. 01/04/15  Yes Historical Provider, MD  Rivaroxaban (XARELTO) 20 MG TABS Take 20 mg by mouth daily.   Yes Historical Provider, MD  EPCLUSA 400-100 MG TABS Take 1 tablet by mouth daily. 01/29/15   Historical Provider, MD   Physical Exam: Filed Vitals:   02/05/15 2030 02/05/15 2045 02/05/15 2115 02/05/15 2130  BP: 99/64  99/68 97/65  Pulse: 87  81 79  Temp:      TempSrc:      Resp: 15 20    SpO2: 93%  92% 91%    Wt Readings from Last 3 Encounters:  12/05/14 97.523 kg (215 lb)  06/26/14 104.327 kg (230 lb)  06/01/14 102.059 kg (225 lb)    General:  Appears  slightly drowsy but easily arousable and responds appropriately to all my questions. He is not clinically toxic or septic. Blood pressure is slightly soft. Eyes: PERRL, normal lids, irises & conjunctiva ENT: grossly normal hearing, lips & tongue Neck: no LAD, masses or thyromegaly Cardiovascular: RRR, no m/r/g. No LE edema. Telemetry: SR, no arrhythmias  Respiratory: bilateral scattered crackles, minimal wheezing. No bronchial breathing. Abdomen: soft, ntnd Skin: no rash or induration seen on limited exam Musculoskeletal: grossly normal tone BUE/BLE Psychiatric: grossly normal mood and affect, speech fluent and appropriate Neurologic: grossly non-focal.          Labs on Admission:  Basic Metabolic Panel:  Recent Labs Lab 02/05/15 1830  NA 139  K 3.4*  CL 103  CO2 27  GLUCOSE 107*  BUN 17  CREATININE 0.99  CALCIUM 9.2   Liver Function Tests:  Recent Labs Lab  02/05/15 1830  AST 35  ALT 17  ALKPHOS 47  BILITOT 1.3*  PROT 7.3  ALBUMIN 3.7   No results for input(s): LIPASE, AMYLASE in the last 168 hours. No results for input(s): AMMONIA in the last 168 hours. CBC:  Recent Labs Lab 02/05/15 1830  WBC 6.9  NEUTROABS 5.4  HGB 13.8  HCT 41.1  MCV 94.1  PLT 131*   Cardiac Enzymes: No results for input(s): CKTOTAL, CKMB, CKMBINDEX, TROPONINI in the last 168 hours.  BNP (last 3 results) No results for input(s): BNP in the last 8760 hours.  ProBNP (last 3 results) No results for input(s): PROBNP in the last 8760 hours.  CBG: No results for input(s): GLUCAP in the last 168 hours.  Radiological Exams on Admission: Dg Chest Port 1 View  02/05/2015  CLINICAL DATA:  Productive cough and fever for several days, altered mental status today EXAM: PORTABLE CHEST 1 VIEW COMPARISON:  06/26/2014 FINDINGS: Mild cardiac enlargement. Mild vascular congestion with moderate peribronchial wall thickening and mild diffuse interstitial prominence. No pleural effusion. IMPRESSION: Given the clinical scenario, the findings likely represent atypical pneumonia and bronchitis. The possibility of cardiogenic pulmonary edema is considered less likely. Electronically Signed   By: Esperanza Heir M.D.   On: 02/05/2015 19:10      Assessment/Plan   1. COPD with exacerbation. He'll be treated with bronchodilators and intravenous steroids. 2. Lung infection/atypical pneumonia. He'll be treated empirically with intravenous antibiotics. He does not appear to have a localized pneumonia. 3. Polysubstance drug abuse. This appears to be his major problem. 4. Hepatitis B infection/hepatitis C infection. Continue treatment for this. 5. Chronic pain syndrome. Continue with opioids at a reduced dose. 6. Anxiety. He is on benzodiazepines. A reduced dose in this setting. 7. History of pulmonary embolism. Patient is on chronic anticoagulation therapy. Continue.  He'll be  admitted to telemetry. He tells me he wants to be discharged from the hospital. I have told him that it is in his best medical interest to stay in the hospital for treatment and I have told him that he could deteriorate and even possibly die from respiratory infection exacerbated by COPD. Further recommendations will depend on patient's hospital progress.    Code Status: full code.   DVT Prophylaxis: chronic anticoagulation.   Family Communication: I discussed the plan with the patient at the bedside.   Disposition Plan: Home after he is medically stable.    Time spent: 60 minutes.   Wilson Singer [facility] Pager [phone].

## 2015-02-05 NOTE — ED Notes (Signed)
Pt up to side of bed and gave urine specimen. Specimen sent to lab for analysis.

## 2015-02-05 NOTE — ED Notes (Signed)
Pt's father states that he has been sick for the past few days with productive cough, fever, and today altered mental status.

## 2015-02-05 NOTE — ED Notes (Signed)
Spoke with Dr. Dalene SeltzerSchlossman regarding need for 3liter NS bolus per sepsis protocol. Dr. Dalene SeltzerSchlossman only wants pt to have 2 liters.

## 2015-02-05 NOTE — ED Provider Notes (Signed)
CSN: [id number]     Arrival date & time 02/05/15  1805 History   First MD Initiated Contact with Patient 02/05/15 1810     Chief Complaint  Patient presents with  . Code Sepsis     (Consider location/radiation/quality/duration/timing/severity/associated sxs/prior Treatment) HPI Comments: Patient initially too somnolent to provide hx, and father provided hx of cough, pt later able to provide some hx, unclear reliability of ROS  Patient is a 54 y.o. male presenting with cough.  Cough Cough characteristics:  Productive Sputum characteristics:  Yellow and brown Severity:  Severe Onset quality:  Gradual Duration:  4 days Timing:  Constant Progression:  Worsening Chronicity:  Recurrent Associated symptoms: chest pain (with coughing), fever, headaches, shortness of breath and sore throat   Associated symptoms: no rash     Past Medical History  Diagnosis Date  . PE (pulmonary embolism)   . Degenerative disk disease   . Arthritis   . Family history of anesthesia complication     mother has N/V  . Anxiety   . GERD (gastroesophageal reflux disease)   . Headache(784.0)   . Pneumonia     has recurrent pneumonia, last ~1 year ago  . Asthma     has not had an attack in over a year  . COPD (chronic obstructive pulmonary disease) (HCC)   . Hiatal hernia    Past Surgical History  Procedure Laterality Date  . Cholecystectomy    . Hernia repair      umbilical  . Closed reduction mandible with mandibuloma  10/21/2011    Procedure: CLOSED REDUCTION MANDIBLE WITH MANDIBULOMAXILLARY FUSION;  Surgeon: Darletta Moll, MD;  [facility];  Service: ENT;  Laterality: N/A;  MMF screws  . Orif mandibular fracture  10/21/2011    Procedure: OPEN REDUCTION INTERNAL FIXATION (ORIF) MANDIBULAR FRACTURE;  Surgeon: Darletta Moll, MD;  Location: [facility] OR;  Service: ENT;  Laterality: N/A;  . Hand surgery  20 years ago    right  . Mandibular hardware removal  11/11/2011    Procedure: MANDIBULAR HARDWARE REMOVAL;   Surgeon: Darletta Moll, MD;  Location:  SURGERY CENTER;  Service: ENT;  Laterality: N/A;  . Leg surgery     Family History  Problem Relation Age of Onset  . Coronary artery disease Mother   . Coronary artery disease Father   . Diabetes Other    Social History  Substance Use Topics  . Smoking status: Current Every Day Smoker -- 0.50 packs/day for 35 years    Types: Cigarettes  . Smokeless tobacco: Never Used  . Alcohol Use: No    Review of Systems  Unable to perform ROS: Mental status change  Constitutional: Positive for fever, activity change, appetite change and fatigue.  HENT: Positive for congestion and sore throat.   Respiratory: Positive for cough and shortness of breath.   Cardiovascular: Positive for chest pain (with coughing).  Gastrointestinal: Positive for nausea, vomiting and diarrhea. Negative for abdominal pain.  Musculoskeletal: Positive for arthralgias (chronic).  Skin: Negative for rash.  Neurological: Positive for headaches.      Allergies  Review of patient's allergies indicates no known allergies.  Home Medications   Prior to Admission medications   Medication Sig Start Date End Date Taking? Authorizing Provider  albuterol (PROVENTIL HFA;VENTOLIN HFA) 108 (90 BASE) MCG/ACT inhaler Inhale 2 puffs into the lungs every 4 (four) hours as needed. FOR SHORTNESS OF BREATH   Yes Historical Provider, MD  budesonide-formoterol [facility]) 160-4.5 MCG/ACT inhaler  Inhale 2 puffs into the lungs 2 (two) times daily. 08/19/13  Yes Vassie Loll, MD  citalopram (CELEXA) 40 MG tablet Take 40 mg by mouth daily.   Yes Historical Provider, MD  clonazePAM (KLONOPIN) 1 MG tablet Take 0.5-1 mg by mouth 4 (four) times daily.    Yes Historical Provider, MD  LYRICA 300 MG capsule Take 300 mg by mouth 2 (two) times daily. 01/31/15  Yes Historical Provider, MD  ondansetron (ZOFRAN) 8 MG tablet Take 8 mg by mouth every 6 (six) hours as needed for vomiting.  01/04/15  Yes  Historical Provider, MD  Oxycodone HCl 10 MG TABS Take 1 tablet (10 mg total) by mouth 4 (four) times daily as needed. Patient taking differently: Take 10 mg by mouth 4 (four) times daily as needed (for pain).  12/12/11  Yes Carleene Cooper, MD  predniSONE (DELTASONE) 20 MG tablet Take 20 mg by mouth See admin instructions. Starting on 01/31/2015 take one tablet three times daily for 5 days, then take one tablet twice daily for 5 days then take one tablet daily for 5 days 01/31/15  Yes Historical Provider, MD  QUEtiapine (SEROQUEL) 300 MG tablet Take 1 tablet by mouth at bedtime.  08/04/13  Yes Historical Provider, MD  ribavirin (COPEGUS) 200 MG tablet Take 600 mg by mouth 2 (two) times daily. 01/16/15 02/13/15 Yes Historical Provider, MD  risperiDONE (RISPERDAL) 1 MG tablet Take 1 mg by mouth at bedtime. 01/04/15  Yes Historical Provider, MD  Rivaroxaban (XARELTO) 20 MG TABS Take 20 mg by mouth daily.   Yes Historical Provider, MD  EPCLUSA 400-100 MG TABS Take 1 tablet by mouth daily. 01/29/15   Historical Provider, MD   BP 96/61 mmHg  Pulse 82  Temp(Src) 98.7 F (37.1 C) (Oral)  Resp 20  Wt 201 lb 9.6 oz (91.445 kg)  SpO2 90% Physical Exam  Constitutional: He is oriented to person, place, and time. He appears well-developed and well-nourished. He appears listless. He is easily aroused. He appears ill. No distress.  HENT:  Head: Normocephalic and atraumatic.  Eyes: Conjunctivae and EOM are normal. Left eye exhibits normal extraocular motion.  Neck: Normal range of motion. No Brudzinski's sign and no Kernig's sign noted.  Cardiovascular: Normal rate, regular rhythm, normal heart sounds and intact distal pulses.  Exam reveals no gallop and no friction rub.   No murmur heard. Pulmonary/Chest: Effort normal. No respiratory distress. He has no wheezes. He has rhonchi (diffuse). He has no rales.  Abdominal: Soft. He exhibits no distension. There is no tenderness. There is no guarding.   Musculoskeletal: He exhibits no edema.  Neurological: He is oriented to person, place, and time and easily aroused. He appears listless. GCS eye subscore is 3. GCS verbal subscore is 5. GCS motor subscore is 6.  Patient will answer questions then fall asleep Oriented to location, self, not oriented to day   Skin: Skin is warm and dry. He is not diaphoretic.  Nursing note and vitals reviewed.   ED Course  Procedures (including critical care time) Labs Review Labs Reviewed  COMPREHENSIVE METABOLIC PANEL - Abnormal; Notable for the following:    Potassium 3.4 (*)    Glucose, Bld 107 (*)    Total Bilirubin 1.3 (*)    All other components within normal limits  CBC WITH DIFFERENTIAL/PLATELET - Abnormal; Notable for the following:    Platelets 131 (*)    All other components within normal limits  URINALYSIS, ROUTINE W REFLEX MICROSCOPIC (NOT AT  [facility]) - Abnormal; Notable for the following:    Color, Urine AMBER (*)    Hgb urine dipstick MODERATE (*)    Bilirubin Urine SMALL (*)    Ketones, ur 15 (*)    Protein, ur 30 (*)    All other components within normal limits  URINE RAPID DRUG SCREEN, HOSP PERFORMED - Abnormal; Notable for the following:    Opiates POSITIVE (*)    Cocaine POSITIVE (*)    Benzodiazepines POSITIVE (*)    Barbiturates POSITIVE (*)    All other components within normal limits  ACETAMINOPHEN LEVEL - Abnormal; Notable for the following:    Acetaminophen (Tylenol), Serum <10 (*)    All other components within normal limits  BLOOD GAS, VENOUS - Abnormal; Notable for the following:    pH, Ven 7.442 (*)    pCO2, Ven 39.7 (*)    pO2, Ven 192.0 (*)    Bicarbonate 27.0 (*)    Acid-Base Excess 2.8 (*)    All other components within normal limits  URINE MICROSCOPIC-ADD ON - Abnormal; Notable for the following:    Bacteria, UA FEW (*)    All other components within normal limits  CBC - Abnormal; Notable for the following:    WBC 3.8 (*)    RBC 3.76 (*)    Hemoglobin  11.6 (*)    HCT 35.7 (*)    Platelets 127 (*)    All other components within normal limits  COMPREHENSIVE METABOLIC PANEL - Abnormal; Notable for the following:    Glucose, Bld 129 (*)    Calcium 8.4 (*)    Total Protein 6.2 (*)    Albumin 2.9 (*)    ALT 14 (*)    Alkaline Phosphatase 36 (*)    Anion gap 4 (*)    All other components within normal limits  CULTURE, BLOOD (ROUTINE X 2)  CULTURE, BLOOD (ROUTINE X 2)  MRSA PCR SCREENING  URINE CULTURE  ETHANOL  I-STAT CG4 LACTIC ACID, ED    Imaging Review Dg Chest Port 1 View  02/05/2015  CLINICAL DATA:  Productive cough and fever for several days, altered mental status today EXAM: PORTABLE CHEST 1 VIEW COMPARISON:  06/26/2014 FINDINGS: Mild cardiac enlargement. Mild vascular congestion with moderate peribronchial wall thickening and mild diffuse interstitial prominence. No pleural effusion. IMPRESSION: Given the clinical scenario, the findings likely represent atypical pneumonia and bronchitis. The possibility of cardiogenic pulmonary edema is considered less likely. Electronically Signed   By: Esperanza Heiraymond  Rubner M.D.   On: 02/05/2015 19:10   I have personally reviewed and evaluated these images and lab results as part of my medical decision-making.   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  On arrival to the emergency department, patient is somnolent, febrile, and hypoxic to 77% on room air and was  placed on nonrebreather with improvement of his oxygen saturation.  UDS is positive for opiates, cocaine, benzodiazepines, barbiturates, and feel polysubstance abuse is  likely etiology of his altered mental status. Patient is sleepy, but able to wake up, answer questions, has negative meningeal signs, is protecting his airway.  He does not exhibit any respirator depression.  XR shows bilateral infiltrates, likely atypical pneumonia. Given clinical scenario, have decreased suspicion for recurrent pulmonary embolus as cause of hypoxia. Code sepsis was initiated on arrival, blood cx drawn and he was given rocephin/azithromycin for concern of community acquired pneumonia. He was weaned from face mask to 4L nasal cannula.  Initially was given 2 L of NS and was mildly tachcyardic with normal blood pressures, normal lactic acid, however as hr decreased blood pressures decreased to 90s-100s systolic and he was given additional bolus of NS.  Patient admitted to hospitalist for continued care.     Alvira Monday, MD 02/06/15 1243

## 2015-02-06 LAB — COMPREHENSIVE METABOLIC PANEL
ALT: 14 U/L — ABNORMAL LOW (ref 17–63)
ANION GAP: 4 — AB (ref 5–15)
AST: 24 U/L (ref 15–41)
Albumin: 2.9 g/dL — ABNORMAL LOW (ref 3.5–5.0)
Alkaline Phosphatase: 36 U/L — ABNORMAL LOW (ref 38–126)
BUN: 12 mg/dL (ref 6–20)
CHLORIDE: 111 mmol/L (ref 101–111)
CO2: 27 mmol/L (ref 22–32)
Calcium: 8.4 mg/dL — ABNORMAL LOW (ref 8.9–10.3)
Creatinine, Ser: 0.75 mg/dL (ref 0.61–1.24)
GFR calc non Af Amer: >60 mL/min (ref >60)
Glucose, Bld: 129 mg/dL — ABNORMAL HIGH (ref 65–99)
POTASSIUM: 4.1 mmol/L (ref 3.5–5.1)
SODIUM: 142 mmol/L (ref 135–145)
Total Bilirubin: 1.1 mg/dL (ref 0.3–1.2)
Total Protein: 6.2 g/dL — ABNORMAL LOW (ref 6.5–8.1)

## 2015-02-06 LAB — CBC
HCT: 35.7 % — ABNORMAL LOW (ref 39.0–52.0)
Hemoglobin: 11.6 g/dL — ABNORMAL LOW (ref 13.0–17.0)
MCH: 30.9 pg (ref 26.0–34.0)
MCHC: 32.5 g/dL (ref 30.0–36.0)
MCV: 94.9 fL (ref 78.0–100.0)
PLATELETS: 127 10*3/uL — AB (ref 150–400)
RBC: 3.76 MIL/uL — AB (ref 4.22–5.81)
RDW: 13.8 % (ref 11.5–15.5)
WBC: 3.8 10*3/uL — ABNORMAL LOW (ref 4.0–10.5)

## 2015-02-06 LAB — MRSA PCR SCREENING: MRSA by PCR: NEGATIVE

## 2015-02-06 MED ORDER — CLONAZEPAM 0.5 MG PO TABS
0.5000 mg | ORAL_TABLET | Freq: Three times a day (TID) | ORAL | Status: DC | PRN
  Administered 2015-02-06 – 2015-02-07 (×2): 0.5 mg via ORAL
  Filled 2015-02-06 (×2): qty 1

## 2015-02-06 MED ORDER — GUAIFENESIN ER 600 MG PO TB12
600.0000 mg | ORAL_TABLET | Freq: Two times a day (BID) | ORAL | Status: DC
  Administered 2015-02-06 – 2015-02-08 (×5): 600 mg via ORAL
  Filled 2015-02-06 (×5): qty 1

## 2015-02-06 MED ORDER — IPRATROPIUM-ALBUTEROL 0.5-2.5 (3) MG/3ML IN SOLN
3.0000 mL | Freq: Four times a day (QID) | RESPIRATORY_TRACT | Status: DC
  Administered 2015-02-06 – 2015-02-08 (×9): 3 mL via RESPIRATORY_TRACT
  Filled 2015-02-06 (×9): qty 3

## 2015-02-06 MED ORDER — METHYLPREDNISOLONE SODIUM SUCC 125 MG IJ SOLR
60.0000 mg | Freq: Three times a day (TID) | INTRAMUSCULAR | Status: DC
  Administered 2015-02-06 – 2015-02-07 (×3): 60 mg via INTRAVENOUS
  Filled 2015-02-06 (×3): qty 2

## 2015-02-06 MED ORDER — OXYCODONE HCL 5 MG PO TABS
5.0000 mg | ORAL_TABLET | Freq: Three times a day (TID) | ORAL | Status: DC | PRN
  Administered 2015-02-06 – 2015-02-07 (×2): 5 mg via ORAL
  Filled 2015-02-06 (×2): qty 1

## 2015-02-06 MED ORDER — BUDESONIDE 0.25 MG/2ML IN SUSP
0.2500 mg | Freq: Two times a day (BID) | RESPIRATORY_TRACT | Status: DC
  Administered 2015-02-06 – 2015-02-08 (×4): 0.25 mg via RESPIRATORY_TRACT
  Filled 2015-02-06 (×7): qty 2

## 2015-02-06 MED ORDER — CITALOPRAM HYDROBROMIDE 20 MG PO TABS
ORAL_TABLET | ORAL | Status: AC
  Filled 2015-02-06: qty 2

## 2015-02-06 MED ORDER — ALBUTEROL SULFATE (2.5 MG/3ML) 0.083% IN NEBU: 2.5000 mg | INHALATION_SOLUTION | RESPIRATORY_TRACT | Status: DC | PRN

## 2015-02-06 NOTE — Progress Notes (Signed)
Wife verified that she did have this med at home and that she would bring it in today.

## 2015-02-06 NOTE — Progress Notes (Signed)
Late entry (1038):  Patient's wife notified that Sofosbuvir-Velpatasvir 400-100 MG TABS were not available from pharmacy.  Per pharmacist, patient's wife to bring home supply.  Patient's wife verbalized understanding.  Stated will bring in later today.

## 2015-02-06 NOTE — Progress Notes (Signed)
Sofosbuvir-Velpatasvir not available per pharmacist Elmarie Shiley(Tiffany).  Recommended that wife bring med in if she has it at home.  Incoming nurse will be asked to check with pharmacy for availability later today.  Md not notified yet.

## 2015-02-06 NOTE — Progress Notes (Signed)
[facility] PROGRESS NOTE  Eric FasterGary W Stewart [name] DOB: Feb 03, 1961 DOA: 02/05/2015 PCP: Gasper SellsKRAMER,[name], MD  Assessment/Plan: 1. COPD with exacerbation. Will continue treatment with bronchodilators nebulizer, intravenous steroids, IV antibiotics and mucinex. Will start treatment with pulmicort and will continue PRN O2 supplementation. Slowly improving  2. Lung infection/atypical pneumonia/ bronchiectasis. Will continue treatment with intravenous antibiotics. Will follow CXR in am 3. Polysubstance drug abuse. Cessation counseling provided. Patient recognize is a problem and looking to cut down/quit on his own. 4. Hepatitis B infection/hepatitis C infection. Continue home medication therapy. No icterus or jaundice  5. Chronic pain syndrome. Continue with opioids at a very reduced dose (in comparison to home dose). 6. Anxiety. He is on benzodiazepines; will reduce dose and make PRN, as he is lethargic currently  7. History of pulmonary embolism. Patient is on chronic anticoagulation therapy; will continue xarelto  Code Status: Full Family Communication: wife at bedside  Disposition Plan: remains inpatient, continue working on nebulizer therapy, minimize medications that can contribute to lethargy; follow clinical response. Patient is going back home at discharge   Consultants:  None  Procedures:  See below for x-ray reports   Antibiotics:  Rocephin 10/25  Zithromax 10/25  HPI/Subjective: Patient is still with significant wheezing, SOB and slight lethargy; currently is afebrile and denies CP, N/V  Objective: Filed Vitals:   02/06/15 0431  BP: 96/61  Pulse: 82  Temp: 98.7 F (37.1 C)  Resp: 20    Intake/Output Summary (Last 24 hours) at 02/06/15 1325 Last data filed at 02/06/15 1100  Gross per 24 hour  Intake    980 ml  Output    725 ml  Net    255 ml   Filed Weights   02/05/15 2333  Weight: 91.445 kg (201 lb 9.6 oz)    Exam:   General:  Afebrile  currently, denies CP. Still SOB, with significant wheezing and rhonchi; patient is lethargic, but arousable and appropriate. VSS  Cardiovascular: S1 and S2, no rubs or gallops  Respiratory: diffuse wheezing and rhonchi, decrease BS at bases, no crackles  Abdomen: soft, NT, ND, positive BS  Musculoskeletal: no edema, no cyanosis   Data Reviewed: Basic Metabolic Panel:  Recent Labs Lab 02/05/15 1830 02/06/15 0559  NA 139 142  K 3.4* 4.1  CL 103 111  CO2 27 27  GLUCOSE 107* 129*  BUN 17 12  CREATININE 0.99 0.75  CALCIUM 9.2 8.4*   Liver Function Tests:  Recent Labs Lab 02/05/15 1830 02/06/15 0559  AST 35 24  ALT 17 14*  ALKPHOS 47 36*  BILITOT 1.3* 1.1  PROT 7.3 6.2*  ALBUMIN 3.7 2.9*   CBC:  Recent Labs Lab 02/05/15 1830 02/06/15 0559  WBC 6.9 3.8*  NEUTROABS 5.4  --   HGB 13.8 11.6*  HCT 41.1 35.7*  MCV 94.1 94.9  PLT 131* 127*   CBG: No results for input(s): GLUCAP in the last 168 hours.  Recent Results (from the past 240 hour(s))  Culture, blood (routine x 2)     Status: None (Preliminary result)   Collection Time: 02/05/15  6:30 PM  Result Value Ref Range Status   Specimen Description BLOOD LEFT ARM  Final   Special Requests BOTTLES DRAWN AEROBIC AND ANAEROBIC 6CC  Final   Culture PENDING  Incomplete   Report Status PENDING  Incomplete  Culture, blood (routine x 2)     Status: None (Preliminary result)   Collection Time: 02/05/15  6:55 PM  Result Value Ref Range  Status   Specimen Description BLOOD LEFT HAND  Final   Special Requests BOTTLES DRAWN AEROBIC AND ANAEROBIC 6CC  Final   Culture PENDING  Incomplete   Report Status PENDING  Incomplete  MRSA PCR Screening     Status: None   Collection Time: 02/05/15 11:34 PM  Result Value Ref Range Status   MRSA by PCR NEGATIVE NEGATIVE Final    Comment:        The GeneXpert MRSA Assay (FDA approved for NASAL specimens only), is one component of a comprehensive MRSA colonization surveillance  program. It is not intended to diagnose MRSA infection nor to guide or monitor treatment for MRSA infections.      Studies: Dg Chest Port 1 View  02/05/2015  CLINICAL DATA:  Productive cough and fever for several days, altered mental status today EXAM: PORTABLE CHEST 1 VIEW COMPARISON:  06/26/2014 FINDINGS: Mild cardiac enlargement. Mild vascular congestion with moderate peribronchial wall thickening and mild diffuse interstitial prominence. No pleural effusion. IMPRESSION: Given the clinical scenario, the findings likely represent atypical pneumonia and bronchitis. The possibility of cardiogenic pulmonary edema is considered less likely. Electronically Signed   By: Esperanza Heir M.D.   On: 02/05/2015 19:10    Scheduled Meds: . azithromycin  500 mg Intravenous Q24H  . budesonide (PULMICORT) nebulizer solution  0.25 mg Nebulization BID  . cefTRIAXone (ROCEPHIN)  IV  1 g Intravenous Q24H  . Chlorhexidine Gluconate Cloth  6 each Topical Q0600  . citalopram  40 mg Oral Daily  . guaiFENesin  600 mg Oral BID  . ipratropium-albuterol  3 mL Nebulization QID  . methylPREDNISolone (SOLU-MEDROL) injection  60 mg Intravenous 3 times per day  . mupirocin ointment  1 application Nasal BID  . pregabalin  300 mg Oral BID  . QUEtiapine  300 mg Oral QHS  . ribavirin  600 mg Oral BID  . risperiDONE  1 mg Oral QHS  . rivaroxaban  20 mg Oral Daily  . sodium chloride  3 mL Intravenous Q12H  . Sofosbuvir-Velpatasvir  1 tablet Oral Daily   Continuous Infusions: . 0.9 % NaCl with KCl 20 mEq / L 125 mL/hr at 02/06/15 0745    Active Problems:   Hepatitis B infection with hepatitis C infection   COPD (chronic obstructive pulmonary disease) (HCC)   Community acquired pneumonia   Polysubstance dependence including opioid type drug, episodic abuse (HCC)    Time spent: 35 minutes    Vassie Loll  [facility] Pager [phone]. If 7PM-7AM, please contact night-coverage at [email or web address],  password [facility] 02/06/2015, 1:25 PM  LOS: 1 day

## 2015-02-06 NOTE — Progress Notes (Signed)
MRSA Screening = negative.

## 2015-02-07 ENCOUNTER — Inpatient Hospital Stay (HOSPITAL_COMMUNITY): Payer: Medicare HMO

## 2015-02-07 DIAGNOSIS — J9601 Acute respiratory failure with hypoxia: Secondary | ICD-10-CM

## 2015-02-07 DIAGNOSIS — J441 Chronic obstructive pulmonary disease with (acute) exacerbation: Secondary | ICD-10-CM

## 2015-02-07 LAB — URINE CULTURE: Culture: NO GROWTH

## 2015-02-07 MED ORDER — OXYCODONE HCL 5 MG PO TABS
10.0000 mg | ORAL_TABLET | Freq: Four times a day (QID) | ORAL | Status: DC | PRN
  Administered 2015-02-07 – 2015-02-08 (×4): 10 mg via ORAL
  Filled 2015-02-07 (×5): qty 2

## 2015-02-07 MED ORDER — CLONAZEPAM 0.5 MG PO TABS
1.0000 mg | ORAL_TABLET | Freq: Four times a day (QID) | ORAL | Status: DC
  Administered 2015-02-07 – 2015-02-08 (×5): 1 mg via ORAL
  Filled 2015-02-07 (×5): qty 2

## 2015-02-07 MED ORDER — METHYLPREDNISOLONE SODIUM SUCC 125 MG IJ SOLR
60.0000 mg | Freq: Two times a day (BID) | INTRAMUSCULAR | Status: DC
  Administered 2015-02-08: 60 mg via INTRAVENOUS
  Filled 2015-02-07: qty 2

## 2015-02-07 NOTE — Progress Notes (Signed)
SATURATION QUALIFICATIONS: (This note is used to comply with regulatory documentation for home oxygen)  Patient Saturations on Room Air at Rest = 98%  Patient Saturations on Room Air while Ambulating = 85%  Patient Saturations on 2 Liters of oxygen while Ambulating = 91%  Please briefly explain why patient needs home oxygen: Patient's O2 sat dropped while ambulating and patient became SOB.

## 2015-02-07 NOTE — Progress Notes (Signed)
PROGRESS NOTE  [name] [name] JYN:[id number] DOB: 08-06-1960 DOA: 02/05/2015 PCP: Gasper SellsKRAMER,[name], MD  Summary: [age] yom with a history of COPD, hepatitis B/C, anxiety, PE ( on anticoagulation with Xarelto) and polysubstance drug abuse presented with a cough productive of yellow sputum, fever, and supposed AMS. While in the ED, he was noted to be somewhat somnolent, requiring a nonrebreather to maintain saturations. Urine drug screen was positive for barbiturates, benzodiazepines, opiates and cocaine. Chest x-ray shows the possibility of atypical infection versus bronchitis. He was admitted for further management.   Assessment/Plan: 1. Acute hypoxic respiratory failure secondary to CAP, COPD. 2. Atypical pneumonia/ bronchiectasis. Will continue IV antibiotics. Afebrile, WBC wnl. BC pending 3. COPD with exacerbation, slowly improving.  Continue bronchodilators, IV steroids, IV antibiotics and mucinex. Continue supplemental O2  and wean as tolerated.   4. Polysubstance drug abuse. Cessation counseling provided.   5. Hepatitis B infection/hepatitis C infection. Continue home medication therapy. No icterus. 6. Chronic pain syndrome.  7. Anxiety. Stable.  8. History of pulmonary embolism. Patient is on chronic anticoagulation therapy; will continue Xarelto 9. Tobacco abuse. Counseled on cessation.    Overall improved. Continue IV abx, steroids and supplemental O2. May need home oxygen.  Code Status: Full DVT prophylaxis: Xarelto Family Communication: Wife at bedside. Discussed with patient who understands and has no concerns at this time. Disposition Plan: Anticipate discharge within 24-48 hours.   Eric Sacksaniel Goodrich, MD  [facility]  Pager [phone] If 7PM-7AM, please contact night-coverage at [email or web address], password [email or web address] 02/07/2015, 7:20 AM  LOS: 2 days   Consultants:    Procedures:    Antibiotics:  Zithromax 10/24>>  Rocephin 10/24>>  HPI/Subjective: Feels tired but  believes his SOB is improving. Has a mild cough. Has a good appetite and ate lunch.   Per nursing staff, patient is stable while resting however on ambulation his O2 sat drops in the 80s on 2L of O2.   Objective: Filed Vitals:   02/06/15 1738 02/06/15 1935 02/06/15 2108 02/07/15 0625  BP:   97/49 99/52  Pulse:   84 67  Temp:   98 F (36.7 C) 98.3 F (36.8 C)  TempSrc:   Oral Oral  Resp:   17 15  Weight:      SpO2: 92% 93% 94% 97%    Intake/Output Summary (Last 24 hours) at 02/07/15 0720 Last data filed at 02/06/15 2030  Gross per 24 hour  Intake   1835 ml  Output   1475 ml  Net    360 ml     Filed Weights   02/05/15 2333  Weight: 91.445 kg (201 lb 9.6 oz)    Exam:    VSS, afebrile, not hypoxic General:  Appears comfortable, calm. Cardiovascular: Regular rate and rhythm, no murmur, rub or gallop. No lower extremity edema. Telemetry: Sinus rhythm, no arrhythmias  Respiratory: Clear to auscultation bilaterally, no wheezes, rales or rhonchi. Mildly increased respiratory effort. Abdomen: soft, ntnd Psychiatric: grossly normal mood and affect, speech fluent and appropriate  New data reviewed:  Repeat CXR IMPRESSION: Mild improvement in aeration. Persistent perihilar bronchitic changes. Persistent patchy bilateral lower lobe residual infiltrates.  Pertinent data since admission:  CXR IMPRESSION: Given the clinical scenario, the findings likely represent atypical pneumonia and bronchitis. The possibility of cardiogenic pulmonary edema is considered less likely.  Repeat CXR IMPRESSION: Mild improvement in aeration. Persistent perihilar bronchitic changes. Persistent patchy bilateral lower lobe residual infiltrates.  Pending data:  BC/UC  Scheduled Meds: . azithromycin  500 mg Intravenous  Q24H  . budesonide (PULMICORT) nebulizer solution  0.25 mg Nebulization BID  . cefTRIAXone (ROCEPHIN)  IV  1 g Intravenous Q24H  . Chlorhexidine Gluconate Cloth  6 each  Topical Q0600  . citalopram  40 mg Oral Daily  . guaiFENesin  600 mg Oral BID  . ipratropium-albuterol  3 mL Nebulization QID  . methylPREDNISolone (SOLU-MEDROL) injection  60 mg Intravenous 3 times per day  . mupirocin ointment  1 application Nasal BID  . pregabalin  300 mg Oral BID  . QUEtiapine  300 mg Oral QHS  . ribavirin  600 mg Oral BID  . risperiDONE  1 mg Oral QHS  . rivaroxaban  20 mg Oral Daily  . sodium chloride  3 mL Intravenous Q12H  . Sofosbuvir-Velpatasvir  1 tablet Oral Daily   Continuous Infusions: . 0.9 % NaCl with KCl 20 mEq / L 75 mL/hr at 02/06/15 1625    Principal Problem:   Community acquired pneumonia Active Problems:   Hepatitis B infection with hepatitis C infection   COPD (chronic obstructive pulmonary disease) (HCC)   Polysubstance dependence including opioid type drug, episodic abuse (HCC)   Time spent 20 minutes   By signing my name below, I, [name] attest that this documentation has been prepared under the direction and in the presence of Eric Sacks, MD Electronically signed: Burnett Stewart, Scribe.  02/07/2015 1:49pm  I personally performed the services described in this documentation. All medical record entries made by the scribe were at my direction. I have reviewed the chart and agree that the record reflects my personal performance and is accurate and complete. Eric Sacks, MD

## 2015-02-07 NOTE — Care Management Note (Signed)
Case Management Note  Patient Details  Name: Eric FasterGary W Stewart MRN: [id number] Date of Birth: 1960-04-17  Subjective/Objective:                  Pt admitted from home with COPD exacerbation. Pt lives with his wife and will return home at discharge. Pt is fairly independent with ADL's. Pt does have a cane that he uses prn.  Action/Plan: Will need home O2 assessment prior to discharge. Will continue to follow for discharge planning needs.  Expected Discharge Date:                  Expected Discharge Plan:  Home/Self Care  In-House Referral:  NA  Discharge planning Services  CM Consult  Post Acute Care Choice:  NA Choice offered to:  NA  DME Arranged:    DME Agency:     HH Arranged:    HH Agency:     Status of Service:  Completed, signed off  Medicare Important Message Given:    Date Medicare IM Given:    Medicare IM give by:    Date Additional Medicare IM Given:    Additional Medicare Important Message give by:     If discussed at Long Length of Stay Meetings, dates discussed:    Additional Comments:  [name], Eric Latona Crowder, RN 02/07/2015, 11:30 AM

## 2015-02-08 DIAGNOSIS — J189 Pneumonia, unspecified organism: Principal | ICD-10-CM

## 2015-02-08 DIAGNOSIS — J441 Chronic obstructive pulmonary disease with (acute) exacerbation: Secondary | ICD-10-CM

## 2015-02-08 DIAGNOSIS — J9601 Acute respiratory failure with hypoxia: Secondary | ICD-10-CM | POA: Insufficient documentation

## 2015-02-08 MED ORDER — AZITHROMYCIN 250 MG PO TABS: 250.0000 mg | ORAL_TABLET | Freq: Every day | ORAL | Status: DC

## 2015-02-08 MED ORDER — CEFUROXIME AXETIL 250 MG PO TABS: 500.0000 mg | ORAL_TABLET | Freq: Two times a day (BID) | ORAL | Status: DC

## 2015-02-08 MED ORDER — PREDNISONE 20 MG PO TABS: 40.0000 mg | ORAL_TABLET | Freq: Every day | ORAL | Status: DC

## 2015-02-08 MED ORDER — AZITHROMYCIN 250 MG PO TABS: 500.0000 mg | ORAL_TABLET | Freq: Every day | ORAL | Status: DC

## 2015-02-08 MED ORDER — CEFUROXIME AXETIL 500 MG PO TABS: 500.0000 mg | ORAL_TABLET | Freq: Two times a day (BID) | ORAL | Status: DC

## 2015-02-08 MED ORDER — PREDNISONE 10 MG PO TABS
ORAL_TABLET | ORAL | Status: DC
Start: 2015-02-08 — End: 2015-08-20

## 2015-02-08 NOTE — Care Management Important Message (Signed)
Important Message  Patient Details  Name: Vladimir FasterGary W Labine MRN: [id number] Date of Birth: September 27, 1960   Medicare Important Message Given:  Yes-second notification given    Cheryl FlashBlackwell, Nikka Hakimian Crowder, RN 02/08/2015, 1:18 PM

## 2015-02-08 NOTE — Care Management Note (Signed)
Case Management Note  Patient Details  Name: [name] MRN: [id number] Date of Birth: 08/24/1960  Subjective/Objective:                    Action/Plan:   Expected Discharge Date:                  Expected Discharge Plan:  Home/Self Care  In-House Referral:  NA  Discharge planning Services  CM Consult  Post Acute Care Choice:  NA Choice offered to:  NA  DME Arranged:    DME Agency:     HH Arranged:    HH Agency:     Status of Service:  Completed, signed off  Medicare Important Message Given:  Yes-second notification given Date Medicare IM Given:    Medicare IM give by:    Date Additional Medicare IM Given:    Additional Medicare Important Message give by:     If discussed at Long Length of Stay Meetings, dates discussed:    Additional Comments: Pt discharged home today. No CM needs noted. Pt does not qualify for home O2 at this time. Arlyss QueenBlackwell, Kasheena Sambrano Crugerrowder, RN 02/08/2015, 1:19 PM

## 2015-02-08 NOTE — Discharge Summary (Signed)
Physician Discharge Summary  Eric FasterGary W Doody ZOX:[id number] DOB: 06-25-1960 DOA: 02/05/2015  PCP: Gasper SellsKRAMER,[name], MD  Admit date: 02/05/2015 Discharge date: 02/08/2015  Recommendations for Outpatient Follow-up:  1. Follow up with PCP in 1-2 weeks.    Follow-up Information    [name] Schedule an appointment as soon as possible for a visit in 1 week.   Specialty:  Family Medicine   Contact information:   [address] [place] [id number] [phone]        Discharge Diagnoses:  1. Acute hypoxic respiratory failure secondary to CAP, COPD. 2. Atypical pneumonia  3. COPD with exacerbation. 4. Hepatitis B infection/hepatitis C infection.   5. Chronic pain syndrome.  6. PMH pulmonary embolism.  7. Tobacco abuse   Discharge Condition: Improved Disposition: Home  Diet recommendation: Regular  Filed Weights   02/05/15 2333  Weight: 91.445 kg (201 lb 9.6 oz)    History of present illness:  [age] yom with a history of COPD, hepatitis B/C, anxiety, PE ( on anticoagulation with Xarelto) and polysubstance drug abuse presented with a cough productive of yellow sputum, fever, and supposed AMS. While in the ED, he was noted to be somewhat somnolent, requiring a nonrebreather to maintain saturations. Urine drug screen was positive for barbiturates, benzodiazepines, opiates and cocaine. Chest x-ray shows the possibility of atypical infection versus bronchitis. He was admitted for further management.   Hospital Course:  Acute hypoxic respiratory failure secondary to CAP and COPD resolved with abx, steroids. Atypical pneumonia was treated with IV abx. Patient transitioned to oral abx upon discharge. He remained afebrile with a normal WBC. COPD exacerbation resolved with IV abx, IV steroids, and bronchodilators. Patient is ambulatory on RA with stable vital signs.   Individual issues as below:  1. Acute hypoxic respiratory failure secondary to CAP, COPD. Resolved.   2. Atypical pneumonia/ bronchiectasis, resolved clinically. Will transition to oral antibiotics.  3. COPD with exacerbation, resolved. Ambulating on RA without difficulty. 4. Polysubstance drug abuse.  5. Hepatitis B infection/hepatitis C infection. Continue home medication therapy.  6. Chronic pain syndrome.  7. Anxiety. Stable.  8. History of pulmonary embolism. Continue Xarelto 9. Tobacco abuse. Counseled on cessation.  Consultants:  none  Procedures:  none  Antibiotics:  Zithromax 10/24>>10/28  Rocephin 10/24>>01/21/25  Ceftin 10/26>>10/30  Discharge Instructions Discharge Instructions    Activity as tolerated - No restrictions    Complete by:  As directed      Diet general    Complete by:  As directed      Discharge instructions    Complete by:  As directed   Call your physician or seek immediate medical attention for fever, shortness of breath or worsening of condition.            Discharge Medication List as of 02/08/2015  1:32 PM    START taking these medications   Details  azithromycin (ZITHROMAX) 250 MG tablet Take 1 tablet (250 mg total) by mouth daily. Take in evening., Starting 02/08/2015, Until Discontinued, Normal    cefUROXime (CEFTIN) 500 MG tablet Take 1 tablet (500 mg total) by mouth 2 (two) times daily with a meal., Starting 02/08/2015, Until Discontinued, Normal      CONTINUE these medications which have CHANGED   Details  predniSONE (DELTASONE) 10 MG tablet Take 40 mg by mouth daily for 3 days, then take 20 mg by mouth daily for 3 days, then take 10 mg by mouth daily for 3 days, then stop., Normal  CONTINUE these medications which have NOT CHANGED   Details  albuterol (PROVENTIL HFA;VENTOLIN HFA) 108 (90 BASE) MCG/ACT inhaler Inhale 2 puffs into the lungs every 4 (four) hours as needed. FOR SHORTNESS OF BREATH, Until Discontinued, Historical Med    budesonide-formoterol (SYMBICORT) 160-4.5 MCG/ACT inhaler Inhale 2 puffs  into the lungs 2 (two) times daily., Starting 08/19/2013, Until Discontinued, Print    citalopram (CELEXA) 40 MG tablet Take 40 mg by mouth daily., Until Discontinued, Historical Med    clonazePAM (KLONOPIN) 1 MG tablet Take 0.5-1 mg by mouth 4 (four) times daily. , Until Discontinued, Historical Med    LYRICA 300 MG capsule Take 300 mg by mouth 2 (two) times daily., Starting 01/31/2015, Until Discontinued, Historical Med    ondansetron (ZOFRAN) 8 MG tablet Take 8 mg by mouth every 6 (six) hours as needed for vomiting. , Starting 01/04/2015, Until Discontinued, Historical Med    Oxycodone HCl 10 MG TABS Take 1 tablet (10 mg total) by mouth 4 (four) times daily as needed., Starting 12/12/2011, Until Discontinued, Print    QUEtiapine (SEROQUEL) 300 MG tablet Take 1 tablet by mouth at bedtime. , Starting 08/04/2013, Until Discontinued, Historical Med    ribavirin (COPEGUS) 200 MG tablet Take 600 mg by mouth 2 (two) times daily., Starting 01/16/2015, Until Tue 02/13/15, Historical Med    risperiDONE (RISPERDAL) 1 MG tablet Take 1 mg by mouth at bedtime., Starting 01/04/2015, Until Discontinued, Historical Med    Rivaroxaban (XARELTO) 20 MG TABS Take 20 mg by mouth daily., Until Discontinued, Historical Med    EPCLUSA 400-100 MG TABS Take 1 tablet by mouth daily., Starting 01/29/2015, Until Discontinued, Historical Med       No Known Allergies  The results of significant diagnostics from this hospitalization (including imaging, microbiology, ancillary and laboratory) are listed below for reference.    Significant Diagnostic Studies: Dg Chest Port 1 View  02/07/2015  CLINICAL DATA:  Follow-up pneumonia, shortness of Breath EXAM: PORTABLE CHEST 1 VIEW COMPARISON:  02/05/2015 FINDINGS: Cardiomediastinal silhouette is stable. Mild improvement in aeration. Persistent perihilar bronchitic changes. Persistent patchy bilateral lower lobe residual infiltrates. IMPRESSION: Mild improvement in aeration.  Persistent perihilar bronchitic changes. Persistent patchy bilateral lower lobe residual infiltrates. Electronically Signed   By: Natasha Mead M.D.   On: 02/07/2015 09:52   Dg Chest Port 1 View  02/05/2015  CLINICAL DATA:  Productive cough and fever for several days, altered mental status today EXAM: PORTABLE CHEST 1 VIEW COMPARISON:  06/26/2014 FINDINGS: Mild cardiac enlargement. Mild vascular congestion with moderate peribronchial wall thickening and mild diffuse interstitial prominence. No pleural effusion. IMPRESSION: Given the clinical scenario, the findings likely represent atypical pneumonia and bronchitis. The possibility of cardiogenic pulmonary edema is considered less likely. Electronically Signed   By: Esperanza Heir M.D.   On: 02/05/2015 19:10    Microbiology: Recent Results (from the past 240 hour(s))  Culture, blood (routine x 2)     Status: None (Preliminary result)   Collection Time: 02/05/15  6:30 PM  Result Value Ref Range Status   Specimen Description BLOOD LEFT ARM  Final   Special Requests BOTTLES DRAWN AEROBIC AND ANAEROBIC 6CC  Final   Culture NO GROWTH 3 DAYS  Final   Report Status PENDING  Incomplete  Culture, blood (routine x 2)     Status: None (Preliminary result)   Collection Time: 02/05/15  6:55 PM  Result Value Ref Range Status   Specimen Description BLOOD LEFT HAND  Final   Special  Requests BOTTLES DRAWN AEROBIC AND ANAEROBIC 6CC  Final   Culture NO GROWTH 3 DAYS  Final   Report Status PENDING  Incomplete  Urine culture     Status: None   Collection Time: 02/05/15  7:10 PM  Result Value Ref Range Status   Specimen Description URINE, CLEAN CATCH  Final   Special Requests NONE  Final   Culture   Final    NO GROWTH 1 DAY Performed at [facility]    Report Status 02/07/2015 FINAL  Final  MRSA PCR Screening     Status: None   Collection Time: 02/05/15 11:34 PM  Result Value Ref Range Status   MRSA by PCR NEGATIVE NEGATIVE Final    Comment:         The GeneXpert MRSA Assay (FDA approved for NASAL specimens only), is one component of a comprehensive MRSA colonization surveillance program. It is not intended to diagnose MRSA infection nor to guide or monitor treatment for MRSA infections.      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  02/08/2015  I personally performed the services described in this documentation. All medical record entries made by the scribe were at my direction. I have reviewed the chart and agree that the record reflects my personal performance and is accurate and complete. Brendia Sacks, MD

## 2015-02-08 NOTE — Progress Notes (Signed)
Pts O2 sats on RA at rest: 92%  Pts O2 sats ambulating on RA: 96%  Pt does not qualify for home O2 at this time.

## 2015-02-08 NOTE — Progress Notes (Signed)
Pt discharged home today per Dr. Goodrich. Pt's IV site D/C'd and WDL. Pt's VSS. Pt provided with home medication list, discharge instructions and prescriptions. Verbalized understanding. Pt left floor via WC in stable condition accompanied by NT. 

## 2015-02-08 NOTE — Progress Notes (Signed)
PROGRESS NOTE  [name] ZOX:[id number] DOB: February 25, 1961 DOA: 02/05/2015 PCP: Gasper SellsKRAMER,[name], MD  Summary: [age] yom with a history of COPD, hepatitis B/C, anxiety, PE ( on anticoagulation with Xarelto) and polysubstance drug abuse presented with a cough productive of yellow sputum, fever, and supposed AMS. While in the ED, he was noted to be somewhat somnolent, requiring a nonrebreather to maintain saturations. Urine drug screen was positive for barbiturates, benzodiazepines, opiates and cocaine. Chest x-ray shows the possibility of atypical infection versus bronchitis. He was admitted for further management.   Assessment/Plan: 1. Acute hypoxic respiratory failure secondary to CAP, COPD. Resolved.   2. Atypical pneumonia/ bronchiectasis, resolved clinically. Will transition to oral antibiotics.   3. COPD with exacerbation, resolved.  Ambulating on RA without difficulty. 4. Polysubstance drug abuse.   5. Hepatitis B infection/hepatitis C infection. Continue home medication therapy.   6. Chronic pain syndrome.  7. Anxiety. Stable.  8. History of pulmonary embolism. Continue Xarelto 9. Tobacco abuse. Counseled on cessation.    Overall improved. Discharge home today.   Code Status: Full DVT prophylaxis: Xarelto Family Communication:   Discussed with patient who understands and has no concerns at this time. Disposition Plan: Discharge home today.   Brendia Sacksaniel Kimiyo Carmicheal, MD  [facility]  Pager [phone] If 7PM-7AM, please contact night-coverage at [email or web address], password [email or web address] 02/08/2015, 12:35 PM  LOS: 3 days   Consultants:  none  Procedures:  none  Antibiotics:  Zithromax 10/24>>10/28  Rocephin 10/24>>01/21/25  Ceftin 10/26>>10/30  HPI/Subjective: Feels much improved. Has an appetite and has been eating well. Denies any SOB or CP. No hypoxia today.  Objective: Filed Vitals:   02/08/15 0739 02/08/15 0743 02/08/15 1016 02/08/15 1158  BP:      Pulse:      Temp:        TempSrc:      Resp:      Height:      Weight:      SpO2: 95% 94% 95% 94%    Intake/Output Summary (Last 24 hours) at 02/08/15 1235 Last data filed at 02/08/15 0800  Gross per 24 hour  Intake    480 ml  Output    100 ml  Net    380 ml     Filed Weights   02/05/15 2333  Weight: 91.445 kg (201 lb 9.6 oz)    Exam:    VSS, afebrile, not hypoxic General:  Appears calm and comfortable Cardiovascular: RRR, no m/r/g. No LE edema. Telemetry: SB Respiratory: CTA bilaterally, no w/r/r. Normal respiratory effort. Psychiatric: grossly normal mood and affect, speech fluent and appropriate  New data reviewed:  None   Pertinent data since admission:  CXR IMPRESSION: Given the clinical scenario, the findings likely represent atypical pneumonia and bronchitis. The possibility of cardiogenic pulmonary edema is considered less likely.  Repeat CXR IMPRESSION: Mild improvement in aeration. Persistent perihilar bronchitic changes. Persistent patchy bilateral lower lobe residual infiltrates.  Pending data:  BC/UC  Scheduled Meds: . azithromycin  500 mg Oral Daily  . budesonide (PULMICORT) nebulizer solution  0.25 mg Nebulization BID  . cefUROXime  500 mg Oral BID WC  . citalopram  40 mg Oral Daily  . clonazePAM  1 mg Oral QID  . guaiFENesin  600 mg Oral BID  . ipratropium-albuterol  3 mL Nebulization QID  . [START ON 02/09/2015] predniSONE  40 mg Oral Q breakfast  . pregabalin  300 mg Oral BID  . QUEtiapine  300 mg Oral QHS  . ribavirin  600 mg Oral BID  . risperiDONE  1 mg Oral QHS  . rivaroxaban  20 mg Oral Daily  . sodium chloride  3 mL Intravenous Q12H  . 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  02/08/2015 12:29pm  I personally performed the services described in this documentation. All medical record entries made by the scribe were at my direction. I have reviewed the chart and agree that the record reflects my personal performance and is accurate and complete. Brendia Sacks, MD

## 2015-02-10 LAB — CULTURE, BLOOD (ROUTINE X 2)
CULTURE: NO GROWTH
CULTURE: NO GROWTH

## 2015-03-23 ENCOUNTER — Other Ambulatory Visit (HOSPITAL_COMMUNITY): Payer: Self-pay | Admitting: Family Medicine

## 2015-03-23 ENCOUNTER — Ambulatory Visit (HOSPITAL_COMMUNITY)
Admission: RE | Admit: 2015-03-23 | Discharge: 2015-03-23 | Disposition: A | Discharge status: Home / Self Care | Payer: Medicare HMO | Source: Ambulatory Visit | Attending: Family Medicine | Admitting: Family Medicine

## 2015-03-23 DIAGNOSIS — R05 Cough: Secondary | ICD-10-CM | POA: Insufficient documentation

## 2015-03-23 DIAGNOSIS — R0989 Other specified symptoms and signs involving the circulatory and respiratory systems: Secondary | ICD-10-CM | POA: Insufficient documentation

## 2015-03-23 DIAGNOSIS — J690 Pneumonitis due to inhalation of food and vomit: Secondary | ICD-10-CM

## 2015-03-23 DIAGNOSIS — R509 Fever, unspecified: Secondary | ICD-10-CM | POA: Insufficient documentation

## 2015-06-06 ENCOUNTER — Encounter: Payer: Self-pay | Admitting: Cardiology

## 2015-06-06 ENCOUNTER — Ambulatory Visit (INDEPENDENT_AMBULATORY_CARE_PROVIDER_SITE_OTHER): Payer: Medicare Other | Admitting: Cardiology

## 2015-06-06 VITALS — BP 122/60 | HR 78 | Ht 76.0 in | Wt 203.0 lb

## 2015-06-06 DIAGNOSIS — G8929 Other chronic pain: Secondary | ICD-10-CM

## 2015-06-06 DIAGNOSIS — Z720 Tobacco use: Secondary | ICD-10-CM

## 2015-06-06 DIAGNOSIS — R072 Precordial pain: Secondary | ICD-10-CM

## 2015-06-06 DIAGNOSIS — D6859 Other primary thrombophilia: Secondary | ICD-10-CM

## 2015-06-06 DIAGNOSIS — R002 Palpitations: Secondary | ICD-10-CM

## 2015-06-06 NOTE — Progress Notes (Signed)
Cardiology Office Note  Date: 06/06/2015   ID: Eric, Stewart May 29, 1960, MRN [id number]  PCP: Vick Frees, MD  Consulting Cardiologist: Nona Dell, MD   Chief Complaint  Patient presents with  . Chest discomfort  . Palpitations    History of Present Illness: [name] is a 55 y.o. male referred for cardiology consultation by Dr. Vick Frees. I reviewed records and updated the chart. He is here with his father who is a patient of mine. He states that over the last 3 months he has been having episodes of recurrent "sharp" chest discomfort without any clear precipitant. This is just as likely to happen at rest as it is with activity. He also has had a vague sense of palpitations, but not not always associated with the chest discomfort. He has been prescribed nitroglycerin by his primary care provider and does take this, states that it is helpful at times.  He is wearing a cardiac monitor for 2 weeks, arranged by Dr. Richardson Dopp. He states that the monitor will be turned back in on Friday. He has had no dizziness or syncope.  His history includes hypercoagulable state with protein S deficiency and prior history of primary embolus. He is on Xarelto for chronic anticoagulation. He does not report any spontaneous bleeding problems.  I reviewed his most recent ECG. He has not undergone any prior ischemic evaluation. His family history includes premature CAD. Medications reviewed and outlined below.  Past Medical History  Diagnosis Date  . PE (pulmonary embolism)   . Degenerative disk disease   . Arthritis   . Anxiety   . GERD (gastroesophageal reflux disease)   . Chronic pain   . History of pneumonia   . Asthma   . COPD (chronic obstructive pulmonary disease) (HCC)   . Hiatal hernia   . Bipolar disorder (HCC)   . Protein S deficiency [facility])     Past Surgical History  Procedure Laterality Date  . Cholecystectomy    . Umbilical hernia repair    . Closed reduction mandible  with mandibuloma  10/21/2011    Procedure: CLOSED REDUCTION MANDIBLE WITH MANDIBULOMAXILLARY FUSION;  Surgeon: Darletta Moll, MD;  Location: [facility];  Service: ENT;  Laterality: N/A;  MMF screws  . Orif mandibular fracture  10/21/2011    Procedure: OPEN REDUCTION INTERNAL FIXATION (ORIF) MANDIBULAR FRACTURE;  Surgeon: Darletta Moll, MD;  Location: [facility] OR;  Service: ENT;  Laterality: N/A;  . Hand surgery Right 20 years ago  . Mandibular hardware removal  11/11/2011    Procedure: MANDIBULAR HARDWARE REMOVAL;  Surgeon: Darletta Moll, MD;  Location: [facility];  Service: ENT;  Laterality: N/A;  . Leg surgery      Current Outpatient Prescriptions  Medication Sig Dispense Refill  . albuterol (PROVENTIL HFA;VENTOLIN HFA) 108 (90 BASE) MCG/ACT inhaler Inhale 2 puffs into the lungs every 4 (four) hours as needed. FOR SHORTNESS OF BREATH    . azithromycin (ZITHROMAX) 250 MG tablet Take 1 tablet (250 mg total) by mouth daily. Take in evening. 2 each 0  . budesonide-formoterol (SYMBICORT) 160-4.5 MCG/ACT inhaler Inhale 2 puffs into the lungs 2 (two) times daily. 1 Inhaler 3  . cefUROXime (CEFTIN) 500 MG tablet Take 1 tablet (500 mg total) by mouth 2 (two) times daily with a meal. 8 tablet 0  . citalopram (CELEXA) 40 MG tablet Take 40 mg by mouth daily.    . clonazePAM (KLONOPIN) 1 MG tablet Take 0.5-1 mg by  mouth 4 (four) times daily.     . EPCLUSA 400-100 MG TABS Take 1 tablet by mouth daily.    Marland Kitchen LYRICA 300 MG capsule Take 300 mg by mouth 2 (two) times daily.  0  . nitroGLYCERIN (NITROSTAT) 0.4 MG SL tablet   5  . ondansetron (ZOFRAN) 8 MG tablet Take 8 mg by mouth every 6 (six) hours as needed for vomiting.   2  . Oxycodone HCl 10 MG TABS Take 1 tablet (10 mg total) by mouth 4 (four) times daily as needed. (Patient taking differently: Take 10 mg by mouth 4 (four) times daily as needed (for pain). ) 28 tablet 0  . predniSONE (DELTASONE) 10 MG tablet Take 40 mg by mouth daily for 3 days, then take 20 mg by  mouth daily for 3 days, then take 10 mg by mouth daily for 3 days, then stop. 21 tablet 0  . QUEtiapine (SEROQUEL) 300 MG tablet Take 1 tablet by mouth at bedtime.     . risperiDONE (RISPERDAL) 1 MG tablet Take 1 mg by mouth at bedtime.  11  . Rivaroxaban (XARELTO) 20 MG TABS Take 20 mg by mouth daily.     No current facility-administered medications for this visit.   Allergies:  Review of patient's allergies indicates no known allergies.   Social History: The patient  reports that he has been smoking Cigarettes.  He has a 17.5 pack-year smoking history. He has never used smokeless tobacco. He reports that he does not drink alcohol or use illicit drugs.   Family History: The patient's family history includes Coronary artery disease in his father and mother; Diabetes in his other.   ROS:  Please see the history of present illness. Otherwise, complete review of systems is positive for chronic pain.  All other systems are reviewed and negative.   Physical Exam: VS:  BP 122/60 mmHg  Pulse 78  Ht 6\' 4"  (1.93 m)  Wt 203 lb (92.08 kg)  BMI 24.72 kg/m2  SpO2 98%, BMI Body mass index is 24.72 kg/(m^2).  Wt Readings from Last 3 Encounters:  06/06/15 203 lb (92.08 kg)  02/05/15 201 lb 9.6 oz (91.445 kg)  12/05/14 215 lb (97.523 kg)    General: Patient appears comfortable at rest. Wearing cardiac monitor. HEENT: Conjunctiva and lids normal, oropharynx clear. Neck: Supple, no elevated JVP or carotid bruits, no thyromegaly. Lungs: Clear to auscultation, nonlabored breathing at rest. Cardiac: Regular rate and rhythm, no S3, soft systolic murmur, no pericardial rub. Abdomen: Soft, nontender, bowel sounds present, no guarding or rebound. Extremities: No pitting edema, distal pulses 2+. Skin: Warm and dry. Tattoos on arms. Musculoskeletal: No kyphosis. Neuropsychiatric: Alert and oriented x3, affect grossly appropriate.   ECG: I personally reviewed the prior tracing from 02/06/2015 which showed  sinus tachycardia with R prime in lead V1.  Recent Labwork: 02/06/2015: ALT 14*; AST 24; BUN 12; Creatinine, Ser 0.75; Hemoglobin 11.6*; Platelets 127*; Potassium 4.1; Sodium 142   Other Studies Reviewed Today:  Carotid Dopplers 09/04/2010: IMPRESSION: No evidence of plaque formation or stenosis.  Chest x-ray 03/23/2015: FINDINGS: Previously noted multifocal airspace consolidation has resolved. Mild diffuse peribronchial cuffing. No pneumothorax. No pleural effusions. No evidence of pulmonary edema. Heart size is normal. Upper mediastinal contours are within normal limits.  IMPRESSION: 1. Resolution of previously noted multifocal airspace disease, compatible with a resolved pneumonia. 2. However, there is now diffuse peribronchial cuffing, suggesting an acute bronchitis.  Assessment and Plan:  1. Recurrent chest pain with  atypical features in a 55 year old male with hypercoagulable state, tobacco abuse, and family history of premature CAD. ECG is nonspecific overall. He has not undergone any prior ischemic evaluation and states that nitroglycerine has been somewhat helpful. He has functional limitations related to chronic pain. Plan is to proceed with a Lexiscan Cardiolite for ischemic evaluation.  2. Hypercoagulable state with reported protein S deficiency and prior history of pulmonary embolus. He is on chronic Xarelto.  3. Tobacco abuse, smoking cessation recommended.  4. Degenerative disc disease with chronic pain. This is managed by Dr. Richardson Dopp.  5. Vague history of palpitations, no syncope or necessarily a correlation to chest discomfort. He is currently wearing a two-week cardiac monitor. Will request results from Dr. Richardson Dopp when available.  Current medicines were reviewed with the patient today.   Orders Placed This Encounter  Procedures  . NM Myocar Multi W/Spect W/Wall Motion / EF    Disposition: FU with me in 3 weeks.   Signed, Jonelle Sidle, MD,  [facility] 06/06/2015 10:54 AM    [facility] at [facility] [address] [place], [place] [address] Phone: [phone]; Fax: [phone]

## 2015-06-06 NOTE — Patient Instructions (Signed)
Medication Instructions:  Your physician recommends that you continue on your current medications as directed. Please refer to the Current Medication list given to you today.   Labwork: NONE  Testing/Procedures: Your physician has requested that you have a lexiscan myoview. For further information please visit [email or web address]. Please follow instruction sheet, as given.    Follow-Up: Your physician recommends that you schedule a follow-up appointment in: 2 WEEKS    Any Other Special Instructions Will Be Listed Below (If Applicable).     If you need a refill on your cardiac medications before your next appointment, please call your pharmacy.   

## 2015-06-13 ENCOUNTER — Encounter (HOSPITAL_COMMUNITY): Admission: RE | Admit: 2015-06-13 | Payer: Medicare Other | Source: Ambulatory Visit

## 2015-06-13 ENCOUNTER — Encounter (HOSPITAL_COMMUNITY)
Admission: RE | Admit: 2015-06-13 | Discharge: 2015-06-13 | Disposition: A | Discharge status: Home / Self Care | Payer: Medicare Other | Source: Ambulatory Visit | Attending: Cardiology | Admitting: Cardiology

## 2015-06-13 ENCOUNTER — Inpatient Hospital Stay (HOSPITAL_COMMUNITY): Admission: RE | Admit: 2015-06-13 | Payer: Medicare Other | Source: Ambulatory Visit

## 2015-06-13 DIAGNOSIS — R072 Precordial pain: Secondary | ICD-10-CM

## 2015-06-22 ENCOUNTER — Encounter (HOSPITAL_COMMUNITY): Admission: RE | Admit: 2015-06-22 | Payer: Medicare Other | Source: Ambulatory Visit

## 2015-06-22 ENCOUNTER — Inpatient Hospital Stay (HOSPITAL_COMMUNITY): Admission: RE | Admit: 2015-06-22 | Payer: Medicare Other | Source: Ambulatory Visit

## 2015-06-22 ENCOUNTER — Encounter (HOSPITAL_COMMUNITY): Payer: Medicare Other

## 2015-06-26 ENCOUNTER — Ambulatory Visit: Payer: Medicare HMO | Admitting: Cardiology

## 2015-06-28 ENCOUNTER — Encounter (HOSPITAL_COMMUNITY): Payer: Medicare Other

## 2015-07-04 ENCOUNTER — Inpatient Hospital Stay (HOSPITAL_COMMUNITY): Admission: RE | Admit: 2015-07-04 | Payer: Medicare Other | Source: Ambulatory Visit

## 2015-07-04 ENCOUNTER — Encounter (HOSPITAL_COMMUNITY): Payer: Self-pay

## 2015-07-04 ENCOUNTER — Encounter (HOSPITAL_COMMUNITY)
Admission: RE | Admit: 2015-07-04 | Discharge: 2015-07-04 | Disposition: A | Discharge status: Home / Self Care | Payer: Medicare Other | Source: Ambulatory Visit | Attending: Cardiology | Admitting: Cardiology

## 2015-07-04 DIAGNOSIS — R931 Abnormal findings on diagnostic imaging of heart and coronary circulation: Secondary | ICD-10-CM | POA: Diagnosis not present

## 2015-07-04 DIAGNOSIS — R072 Precordial pain: Secondary | ICD-10-CM | POA: Insufficient documentation

## 2015-07-04 LAB — NM MYOCAR MULTI W/SPECT W/WALL MOTION / EF
CHL CUP NUCLEAR SDS: 1
CHL CUP NUCLEAR SRS: 2
CHL CUP RESTING HR STRESS: 75 {beats}/min
LV dias vol: 186 mL (ref 62–150)
LV sys vol: 133 mL
NUC STRESS TID: 0.99
Peak HR: 92 {beats}/min
RATE: 0.37
SSS: 3

## 2015-07-04 MED ORDER — REGADENOSON 0.4 MG/5ML IV SOLN
INTRAVENOUS | Status: AC
  Administered 2015-07-04: 0.4 mg via INTRAVENOUS
  Filled 2015-07-04: qty 5

## 2015-07-04 MED ORDER — TECHNETIUM TC 99M SESTAMIBI GENERIC - CARDIOLITE
10.0000 | Freq: Once | INTRAVENOUS | Status: AC | PRN
  Administered 2015-07-04: 10 via INTRAVENOUS

## 2015-07-04 MED ORDER — SODIUM CHLORIDE 0.9% FLUSH
INTRAVENOUS | Status: AC
  Administered 2015-07-04: 10 mL via INTRAVENOUS
  Filled 2015-07-04: qty 10

## 2015-07-04 MED ORDER — TECHNETIUM TC 99M SESTAMIBI - CARDIOLITE
30.0000 | Freq: Once | INTRAVENOUS | Status: AC | PRN
  Administered 2015-07-04: 30 via INTRAVENOUS

## 2015-07-04 MED ORDER — SODIUM CHLORIDE 0.9% FLUSH
INTRAVENOUS | Status: AC
Start: 2015-07-04 — End: 2015-07-04
  Filled 2015-07-04: qty 180

## 2015-07-05 ENCOUNTER — Other Ambulatory Visit: Payer: Self-pay

## 2015-07-05 DIAGNOSIS — R9439 Abnormal result of other cardiovascular function study: Secondary | ICD-10-CM

## 2015-07-10 ENCOUNTER — Ambulatory Visit (HOSPITAL_COMMUNITY): Payer: Medicare Other | Attending: Cardiology

## 2015-07-19 ENCOUNTER — Encounter: Payer: Self-pay | Admitting: Cardiology

## 2015-07-19 ENCOUNTER — Encounter: Payer: Medicare Other | Admitting: Cardiology

## 2015-07-19 NOTE — Progress Notes (Signed)
No show  This encounter was created in error - please disregard.

## 2015-07-21 ENCOUNTER — Encounter: Payer: Self-pay | Admitting: Cardiology

## 2015-08-20 ENCOUNTER — Encounter: Payer: Self-pay | Admitting: Cardiology

## 2015-08-20 ENCOUNTER — Ambulatory Visit (INDEPENDENT_AMBULATORY_CARE_PROVIDER_SITE_OTHER): Payer: Medicare Other | Admitting: Cardiology

## 2015-08-20 VITALS — BP 112/62 | HR 72 | Ht 74.0 in | Wt 206.0 lb

## 2015-08-20 DIAGNOSIS — I429 Cardiomyopathy, unspecified: Secondary | ICD-10-CM | POA: Diagnosis not present

## 2015-08-20 DIAGNOSIS — R42 Dizziness and giddiness: Secondary | ICD-10-CM

## 2015-08-20 DIAGNOSIS — R002 Palpitations: Secondary | ICD-10-CM | POA: Diagnosis not present

## 2015-08-20 DIAGNOSIS — D6859 Other primary thrombophilia: Secondary | ICD-10-CM

## 2015-08-20 NOTE — Progress Notes (Signed)
Cardiology Office Note  Date: 08/20/2015   ID: Eric FasterGary W Stewart, DOB [birth year], MRN [id number]  PCP: Vick FreesOLE,RICHARD, MD  Primary Cardiologist: Nona DellSamuel McDowell, MD   Chief Complaint  Patient presents with  . Follow-up testing    History of Present Illness: [name] is a 55 y.o. male seen in consultation back in February. He was referred for a Cardiolite study at that time for evaluation of chest discomfort, results outlined below. He had no clear evidence of ischemia in the setting of artifact, LVEF was however calculated at 29%, but visually appeared to be normal. I recommended a follow-up echocardiogram, however he did not present for this study.  He is here with his wife today for a follow-up visit. They both indicate that he has had episodes of sudden palpitations, also dizziness. He reports chest pain intermittently as well. He did complete wearing a cardiac monitor arranged by Dr. Richardson Doppole, sinus rhythm documented. He states that he did not have any of these spells while he was wearing a cardiac monitor.  Today we discussed the results of this Cardiolite. I have recommended that he go ahead and proceed with an echocardiogram to exclude cardiomyopathy. I also offered him further cardiac monitoring since he is having worsening symptoms of palpitations.  Past Medical History  Diagnosis Date  . PE (pulmonary embolism)   . Degenerative disk disease   . Arthritis   . Anxiety   . GERD (gastroesophageal reflux disease)   . Chronic pain   . History of pneumonia   . Asthma   . COPD (chronic obstructive pulmonary disease) (HCC)   . Hiatal hernia   . Bipolar disorder (HCC)   . Protein S deficiency [facility](HCC)     Past Surgical History  Procedure Laterality Date  . Cholecystectomy    . Umbilical hernia repair    . Closed reduction mandible with mandibuloma  10/21/2011    Procedure: CLOSED REDUCTION MANDIBLE WITH MANDIBULOMAXILLARY FUSION;  Surgeon: Darletta MollSui W Teoh, MD;  [facility];  Service:  ENT;  Laterality: N/A;  MMF screws  . Orif mandibular fracture  10/21/2011    Procedure: OPEN REDUCTION INTERNAL FIXATION (ORIF) MANDIBULAR FRACTURE;  Surgeon: Darletta MollSui W Teoh, MD;  [facility];  Service: ENT;  Laterality: N/A;  . Hand surgery Right 20 years ago  . Mandibular hardware removal  11/11/2011    Procedure: MANDIBULAR HARDWARE REMOVAL;  Surgeon: Darletta MollSui W Teoh, MD;  Location: [facility];  Service: ENT;  Laterality: N/A;  . Leg surgery      Current Outpatient Prescriptions  Medication Sig Dispense Refill  . albuterol (PROVENTIL HFA;VENTOLIN HFA) 108 (90 BASE) MCG/ACT inhaler Inhale 2 puffs into the lungs every 4 (four) hours as needed. FOR SHORTNESS OF BREATH    . budesonide-formoterol (SYMBICORT) 160-4.5 MCG/ACT inhaler Inhale 2 puffs into the lungs 2 (two) times daily. 1 Inhaler 3  . citalopram (CELEXA) 40 MG tablet Take 40 mg by mouth daily.    . clonazePAM (KLONOPIN) 1 MG tablet Take 0.5-1 mg by mouth 4 (four) times daily.     . nitroGLYCERIN (NITROSTAT) 0.4 MG SL tablet   5  . ondansetron (ZOFRAN) 8 MG tablet Take 8 mg by mouth every 6 (six) hours as needed for vomiting.   2  . Oxycodone HCl 10 MG TABS Take 1 tablet (10 mg total) by mouth 4 (four) times daily as needed. (Patient taking differently: Take 10 mg by mouth 4 (four) times daily as needed (for  pain). ) 28 tablet 0  . QUEtiapine (SEROQUEL) 300 MG tablet Take 1 tablet by mouth at bedtime.     . risperiDONE (RISPERDAL) 1 MG tablet Take 1 mg by mouth at bedtime.  11  . Rivaroxaban (XARELTO) 20 MG TABS Take 20 mg by mouth daily.     No current facility-administered medications for this visit.   Allergies:  Review of patient's allergies indicates no known allergies.   Social History: The patient  reports that he has been smoking Cigarettes.  He has a 17.5 pack-year smoking history. He has never used smokeless tobacco. He reports that he does not drink alcohol or use illicit drugs.   ROS:  Please see the history  of present illness. Otherwise, complete review of systems is positive for chronic pain, he is on oxycodone.  All other systems are reviewed and negative.   Physical Exam: VS:  BP 112/62 mmHg  Pulse 72  Ht  (1.88 m)  Wt 206 lb (93.441 kg)  BMI 26.44 kg/m2  SpO2 96%, BMI Body mass index is 26.44 kg/(m^2).  Wt Readings from Last 3 Encounters:  08/20/15 206 lb (93.441 kg)  06/06/15 203 lb (92.08 kg)  02/05/15 201 lb 9.6 oz (91.445 kg)    General: Patient appears comfortable at rest. HEENT: Conjunctiva and lids normal, oropharynx clear. Neck: Supple, no elevated JVP or carotid bruits, no thyromegaly. Lungs: Clear to auscultation, nonlabored breathing at rest. Cardiac: Regular rate and rhythm, no S3, soft systolic murmur, no pericardial rub. Abdomen: Soft, nontender, bowel sounds present, no guarding or rebound. Extremities: No pitting edema, distal pulses 2+. Skin: Warm and dry. Tattoos on arms. Musculoskeletal: No kyphosis. Neuropsychiatric: Alert and oriented x3, affect grossly appropriate.   ECG: I personally reviewed the prior tracing from 02/05/2015 which showed sinus tachycardia, R' in lead V1, nonspecific ST changes.  Recent Labwork: 02/06/2015: ALT 14*; AST 24; BUN 12; Creatinine, Ser 0.75; Hemoglobin 11.6*; Platelets 127*; Potassium 4.1; Sodium 142   Other Studies Reviewed Today:  Lexiscan Cardiolite 07/04/2015:  There was no ST segment deviation noted during stress.  This is a low risk study.  The left ventricular ejection fraction is severely decreased (<30%).  Poor quality study. No ischemia Cannot interpret inferior wall due to extra cardiac activity and overlying loop of small bowel EF reported as 29% but normal LV size , wall motion and thickening on gated images Suggest f/u echo and cardiac CTA vs cath to further evaluate   Assessment and Plan:  1. History of chest pain. Recent Cardiolite study did not indicate any definitive ischemia in the setting of  artifact. LVEF was calculated at 29% but appeared to be normal, possibly also affected by artifact. I have recommended a follow-up echocardiogram to more objectively evaluate for cardiomyopathy.  2. Intermittent palpitations and feelings of dizziness. Nothing captured by limited cardiac monitor a few months ago per PCP. He reports worsening symptoms and we will obtain a 30 day event recorder.  3. Hypercoagulable state with protein S deficiency and previous history of pulmonary embolus. He is on Xarelto and follows with Dr. Richardson Dopp.  Current medicines were reviewed with the patient today.   Orders Placed This Encounter  Procedures  . Cardiac event monitor    Disposition: Call with results.   Signed, Jonelle Sidle, MD, [facility] 08/20/2015 11:01 AM    [facility] at [facility] [address] [place], [place] [address] Phone: [phone]; Fax: [phone]

## 2015-08-20 NOTE — Patient Instructions (Signed)
Your physician recommends that you schedule a follow-up appointment in: to be determined after tests    Your physician has requested that you have an echocardiogram. Echocardiography is a painless test that uses sound waves to create images of your heart. It provides your doctor with information about the size and shape of your heart and how well your heart's chambers and valves are working. This procedure takes approximately one hour. There are no restrictions for this procedure.    Your physician has recommended that you wear an event monitor for 30 days. Event monitors are medical devices that record the heart's electrical activity. Doctors most often us these monitors to diagnose arrhythmias. Arrhythmias are problems with the speed or rhythm of the heartbeat. The monitor is a small, portable device. You can wear one while you do your normal daily activities. This is usually used to diagnose what is causing palpitations/syncope (passing out).        Thank you for choosing [facility] !

## 2015-08-21 ENCOUNTER — Ambulatory Visit (HOSPITAL_COMMUNITY)
Admission: RE | Admit: 2015-08-21 | Discharge: 2015-08-21 | Disposition: A | Discharge status: Home / Self Care | Payer: Medicare Other | Source: Ambulatory Visit | Attending: Cardiology | Admitting: Cardiology

## 2015-08-21 DIAGNOSIS — K219 Gastro-esophageal reflux disease without esophagitis: Secondary | ICD-10-CM | POA: Diagnosis not present

## 2015-08-21 DIAGNOSIS — I517 Cardiomegaly: Secondary | ICD-10-CM | POA: Insufficient documentation

## 2015-08-21 DIAGNOSIS — J449 Chronic obstructive pulmonary disease, unspecified: Secondary | ICD-10-CM | POA: Insufficient documentation

## 2015-08-21 DIAGNOSIS — I429 Cardiomyopathy, unspecified: Secondary | ICD-10-CM | POA: Diagnosis not present

## 2015-08-22 ENCOUNTER — Ambulatory Visit (INDEPENDENT_AMBULATORY_CARE_PROVIDER_SITE_OTHER): Payer: Medicare Other

## 2015-08-22 DIAGNOSIS — R42 Dizziness and giddiness: Secondary | ICD-10-CM

## 2015-08-22 DIAGNOSIS — R002 Palpitations: Secondary | ICD-10-CM

## 2015-09-24 ENCOUNTER — Telehealth: Payer: Self-pay

## 2015-09-24 NOTE — Telephone Encounter (Signed)
Let pt know his cardiac event monitor showed no pauses or sustained arrhythmias. Monitor showed sinus rhythm throughout with heart rate ranges from  the 50's to 130's. Had to scan into Epic, because there was a problem with the upload from ecardio to epic. ( looks as like the pt. Was only wearing it a couple of days and taking it off a couple of days ) Had to print so dr. Could see all pages.

## 2018-03-02 ENCOUNTER — Emergency Department (HOSPITAL_COMMUNITY): Payer: Medicaid Other

## 2018-03-02 ENCOUNTER — Emergency Department (HOSPITAL_COMMUNITY)
Admission: EM | Admit: 2018-03-02 | Discharge: 2018-03-02 | Disposition: A | Discharge status: Home / Self Care | Payer: Medicaid Other | Attending: Emergency Medicine | Admitting: Emergency Medicine

## 2018-03-02 ENCOUNTER — Other Ambulatory Visit: Payer: Self-pay

## 2018-03-02 ENCOUNTER — Encounter (HOSPITAL_COMMUNITY): Payer: Self-pay | Admitting: Emergency Medicine

## 2018-03-02 DIAGNOSIS — J449 Chronic obstructive pulmonary disease, unspecified: Secondary | ICD-10-CM | POA: Insufficient documentation

## 2018-03-02 DIAGNOSIS — F17210 Nicotine dependence, cigarettes, uncomplicated: Secondary | ICD-10-CM | POA: Insufficient documentation

## 2018-03-02 DIAGNOSIS — Z79899 Other long term (current) drug therapy: Secondary | ICD-10-CM | POA: Diagnosis not present

## 2018-03-02 DIAGNOSIS — R0602 Shortness of breath: Secondary | ICD-10-CM | POA: Diagnosis present

## 2018-03-02 DIAGNOSIS — F410 Panic disorder [episodic paroxysmal anxiety] without agoraphobia: Secondary | ICD-10-CM | POA: Diagnosis not present

## 2018-03-02 LAB — CBC WITH DIFFERENTIAL/PLATELET
ABS IMMATURE GRANULOCYTES: 0.02 10*3/uL (ref 0.00–0.07)
BASOS ABS: 0 10*3/uL (ref 0.0–0.1)
BASOS PCT: 0 %
Eosinophils Absolute: 0.1 10*3/uL (ref 0.0–0.5)
Eosinophils Relative: 1 %
HCT: 41.4 % (ref 39.0–52.0)
Hemoglobin: 13.5 g/dL (ref 13.0–17.0)
Immature Granulocytes: 0 %
Lymphocytes Relative: 23 %
Lymphs Abs: 1.3 10*3/uL (ref 0.7–4.0)
MCH: 29.2 pg (ref 26.0–34.0)
MCHC: 32.6 g/dL (ref 30.0–36.0)
MCV: 89.6 fL (ref 80.0–100.0)
MONO ABS: 0.3 10*3/uL (ref 0.1–1.0)
Monocytes Relative: 5 %
NEUTROS ABS: 4.2 10*3/uL (ref 1.7–7.7)
Neutrophils Relative %: 71 %
PLATELETS: 183 10*3/uL (ref 150–400)
RBC: 4.62 MIL/uL (ref 4.22–5.81)
RDW: 12.6 % (ref 11.5–15.5)
WBC: 5.9 10*3/uL (ref 4.0–10.5)
nRBC: 0 % (ref 0.0–0.2)

## 2018-03-02 LAB — COMPREHENSIVE METABOLIC PANEL
ALT: 18 U/L (ref 0–44)
AST: 18 U/L (ref 15–41)
Albumin: 4.1 g/dL (ref 3.5–5.0)
Alkaline Phosphatase: 54 U/L (ref 38–126)
Anion gap: 9 (ref 5–15)
BUN: 9 mg/dL (ref 6–20)
CHLORIDE: 101 mmol/L (ref 98–111)
CO2: 27 mmol/L (ref 22–32)
Calcium: 9.2 mg/dL (ref 8.9–10.3)
Creatinine, Ser: 0.61 mg/dL (ref 0.61–1.24)
GFR calc non Af Amer: >60 mL/min (ref >60)
Glucose, Bld: 136 mg/dL — ABNORMAL HIGH (ref 70–99)
POTASSIUM: 3.7 mmol/L (ref 3.5–5.1)
SODIUM: 137 mmol/L (ref 135–145)
Total Bilirubin: 0.4 mg/dL (ref 0.3–1.2)
Total Protein: 7.8 g/dL (ref 6.5–8.1)

## 2018-03-02 LAB — TROPONIN I

## 2018-03-02 MED ORDER — LORAZEPAM 1 MG PO TABS
1.0000 mg | ORAL_TABLET | Freq: Once | ORAL | Status: AC
Start: 2018-03-02 — End: 2018-03-02
  Administered 2018-03-02: 1 mg via ORAL
  Filled 2018-03-02: qty 1

## 2018-03-02 MED ORDER — ALBUTEROL SULFATE (2.5 MG/3ML) 0.083% IN NEBU
5.0000 mg | INHALATION_SOLUTION | Freq: Once | RESPIRATORY_TRACT | Status: AC
  Administered 2018-03-02: 5 mg via RESPIRATORY_TRACT
  Filled 2018-03-02: qty 6

## 2018-03-02 MED ORDER — ASPIRIN 81 MG PO CHEW
324.0000 mg | CHEWABLE_TABLET | Freq: Once | ORAL | Status: AC
  Administered 2018-03-02: 324 mg via ORAL
  Filled 2018-03-02: qty 4

## 2018-03-02 NOTE — ED Notes (Signed)
Pt ambulatory to waiting room. Pt verbalized understanding of discharge instructions.   

## 2018-03-02 NOTE — ED Provider Notes (Signed)
[facility] EMERGENCY DEPARTMENT Provider Note   CSN: [id number] Arrival date & time: 03/02/18  1620     History   Chief Complaint Chief Complaint  Patient presents with  . Shortness of Breath    HPI [name] is a 57 y.o. male.  HPI  The pt is a 57 y/o male -a known history of prior alcohol abuse with cirrhosis of the liver, history of hepatitis B and C, history of bipolar disorder, COPD and prior history of protein S deficiency and pulmonary embolism.  The patient states that he had acute onset of shortness of breath when he became very upset that he was having problems with the right documentation for the recreational vehicle that he just bought.  He could not find the right paperwork and became extremely upset very quickly.  He states at this time he is feeling much better, he is much more calm.  At the time he was feeling short of breath which was persistent severe and nothing seemed to make it better or worse.  He does not any longer smoke cigarettes or drink alcohol, he got out of jail approximately 2 months ago where he been incarcerated for 4 years.  Past Medical History:  Diagnosis Date  . Anxiety   . Arthritis   . Asthma   . Bipolar disorder (HCC)   . Chronic pain   . COPD (chronic obstructive pulmonary disease) (HCC)   . Degenerative disk disease   . GERD (gastroesophageal reflux disease)   . Hiatal hernia   . History of pneumonia   . PE (pulmonary embolism)   . Protein S deficiency [facility])     Patient Active Problem List   Diagnosis Date Noted  . Acute respiratory failure with hypoxia (HCC)   . COPD exacerbation (HCC) 02/07/2015  . Community acquired pneumonia 02/05/2015  . Polysubstance dependence including opioid type drug, episodic abuse (HCC) 02/05/2015  . Healthcare-associated pneumonia 08/17/2013  . Osteoarthritis 08/17/2013  . GERD (gastroesophageal reflux disease) 08/17/2013  . Hx of pulmonary embolus 08/17/2013  . Hepatitis B infection with  hepatitis C infection 08/17/2013  . COPD (chronic obstructive pulmonary disease) (HCC) 08/17/2013  . Anxiety 08/17/2013  . Hypokalemia 08/17/2013    Past Surgical History:  Procedure Laterality Date  . CHOLECYSTECTOMY    . CLOSED REDUCTION MANDIBLE WITH MANDIBULOMA  10/21/2011   Procedure: CLOSED REDUCTION MANDIBLE WITH MANDIBULOMAXILLARY FUSION;  Surgeon: Darletta Moll, MD;  Location: [facility] OR;  Service: ENT;  Laterality: N/A;  MMF screws  . HAND SURGERY Right 20 years ago  . LEG SURGERY    . MANDIBULAR HARDWARE REMOVAL  11/11/2011   Procedure: MANDIBULAR HARDWARE REMOVAL;  Surgeon: Darletta Moll, MD;  Location: [facility];  Service: ENT;  Laterality: N/A;  . ORIF MANDIBULAR FRACTURE  10/21/2011   Procedure: OPEN REDUCTION INTERNAL FIXATION (ORIF) MANDIBULAR FRACTURE;  Surgeon: Darletta Moll, MD;  [facility];  Service: ENT;  Laterality: N/A;  . UMBILICAL HERNIA REPAIR          Home Medications    Prior to Admission medications   Medication Sig Start Date End Date Taking? Authorizing Provider  Milk Thistle 500 MG CAPS Take by mouth.   Yes [provider]  omeprazole (PRILOSEC) 40 MG capsule Take 40 mg by mouth daily.   Yes [provider]  ondansetron (ZOFRAN) 8 MG tablet Take 8 mg by mouth every 6 (six) hours as needed for vomiting.  01/04/15  Yes [provider]  Rivaroxaban (XARELTO) 20 MG TABS Take 20 mg by mouth daily.   Yes [provider]  albuterol (PROVENTIL HFA;VENTOLIN HFA) 108 (90 BASE) MCG/ACT inhaler Inhale 2 puffs into the lungs every 4 (four) hours as needed. FOR SHORTNESS OF BREATH    [provider]  budesonide-formoterol (SYMBICORT) 160-4.5 MCG/ACT inhaler Inhale 2 puffs into the lungs 2 (two) times daily. 08/19/13   Vassie Loll, MD  citalopram (CELEXA) 40 MG tablet Take 40 mg by mouth daily.    [provider]  clonazePAM (KLONOPIN) 1 MG tablet Take 0.5-1 mg by mouth 4 (four) times daily.     [provider]  QUEtiapine (SEROQUEL) 300 MG tablet Take 1 tablet by mouth at bedtime.  08/04/13   [provider]  risperiDONE (RISPERDAL) 1 MG tablet Take 1 mg by mouth at bedtime. 01/04/15   [provider]    Family History Family History  Problem Relation Age of Onset  . Coronary artery disease Mother   . Coronary artery disease Father   . Diabetes Other     Social History Social History   Tobacco Use  . Smoking status: Current Every Day Smoker    Packs/day: 0.50    Years: 35.00    Pack years: 17.50    Types: Cigarettes  . Smokeless tobacco: Never Used  Substance Use Topics  . Alcohol use: No    Alcohol/week: 0.0 standard drinks  . Drug use: No     Allergies   Patient has no known allergies.   Review of Systems Review of Systems  All other systems reviewed and are negative.    Physical Exam Updated Vital Signs BP 121/79 (BP Location: Right Arm)   Pulse 66   Temp 98.2 F (36.8 C) (Oral)   Resp 17   Ht 1.905 m (6\' 3" )   Wt 105.2 kg   SpO2 98%   BMI 29.00 kg/m   Physical Exam  Constitutional: He appears well-developed and well-nourished. No distress.  HENT:  Head: Normocephalic and atraumatic.  Mouth/Throat: Oropharynx is clear and moist. No oropharyngeal exudate.  Eyes: Pupils are equal, round, and reactive to light. Conjunctivae and EOM are normal. Right eye exhibits no discharge. Left eye exhibits no discharge. No scleral icterus.  Neck: Normal range of motion. Neck supple. No JVD present. No thyromegaly present.  Cardiovascular: Normal rate, regular rhythm, normal heart sounds and intact distal pulses. Exam reveals no gallop and no friction rub.  No murmur heard. Heart rate of 65  Pulmonary/Chest: Effort normal. No respiratory distress. He has wheezes ( Slight expiratory wheezing on forced expiration in both lung fields). He has no rales.  No increased work of breathing or respiratory distress  Abdominal: Soft. Bowel sounds are  normal. He exhibits no distension and no mass. There is no tenderness.  Musculoskeletal: Normal range of motion. He exhibits no edema or tenderness.  Lymphadenopathy:    He has no cervical adenopathy.  Neurological: He is alert. Coordination normal.  Skin: Skin is warm and dry. No rash noted. No erythema.  Psychiatric: He has a normal mood and affect. His behavior is normal.  The patient appears to be slightly anxious but is not hallucinating or suicidal, redirectable, clear thought process  Nursing note and vitals reviewed.    ED Treatments / Results  Labs (all labs ordered are listed, but only abnormal results are displayed) Labs Reviewed  COMPREHENSIVE METABOLIC PANEL - Abnormal; Notable for the following components:  Result Value   Glucose, Bld 136 (*)    All other components within normal limits  CBC WITH DIFFERENTIAL/PLATELET  TROPONIN I    EKG EKG Interpretation  Date/Time:  Tuesday March 02 2018 16:32:40 EST Ventricular Rate:  58 PR Interval:  166 QRS Duration: 96 QT Interval:  434 QTC Calculation: 426 R Axis:   58 Text Interpretation:  Sinus bradycardia Otherwise normal ECG Since last tracing rate slower Confirmed by Eber HongMiller, Boyd [place] ([id number]) on 03/02/2018 4:42:31 PM   Radiology Dg Chest 2 View  Result Date: 03/02/2018 CLINICAL DATA:  Sudden onset of dyspnea EXAM: CHEST - 2 VIEW COMPARISON:  01/21/2015 CXR FINDINGS: Central pulmonary vascular congestion. Top-normal heart size. Mild uncoiling of the thoracic aorta without aneurysm. Degenerative change noted of the dorsal spine. No effusion or pneumothorax. IMPRESSION: Central pulmonary vascular congestion. Electronically Signed   By: Tollie Ethavid  Kwon M.D.   On: 03/02/2018 18:51    Procedures Procedures (including critical care time)  Medications Ordered in ED Medications  albuterol (PROVENTIL) (2.5 MG/3ML) 0.083% nebulizer solution 5 mg (5 mg Nebulization Given 03/02/18 1757)  LORazepam (ATIVAN) tablet 1 mg (1  mg Oral Given 03/02/18 1826)  aspirin chewable tablet 324 mg (324 mg Oral Given 03/02/18 1828)     Initial Impression / Assessment and Plan / ED Course  I have reviewed the triage vital signs and the nursing notes.  Pertinent labs & imaging results that were available during my care of the patient were reviewed by me and considered in my medical decision making (see chart for details).  Clinical Course as of Mar 03 2027  Tue Mar 02, 2018  1934 Chest x-ray does not show any obvious infiltrates or pneumothorax, there is central venous congestion   [BM]  1951 Labs are totally unremarkable, EKG is reassuring, the patient feels better with getting Ativan.  At this time I feel the patient is stable for discharge, he is agreeable, I do not think this is going to be the result of a pulmonary embolism or acute coronary syndrome, the patient is agreeable and is willing to follow-up outpatient.  He will be given a list of family doctors if he does not have one   [BM]    Clinical Course User Index [BM] Eber HongMiller, Jazariah Teall, MD    The patient has no edema, lung sounds with mild wheezing but no tachycardia or rales, EKG shows a heart rate of 58 with no other acute findings and no signs of ischemia.  Will obtain a chest x-ray and some basic labs.  This does not seem to be related to a pulmonary embolism and the patient is on Xarelto.  Testing neg Pt aware of f/u plan Stable at this time.  Final Clinical Impressions(s) / ED Diagnoses   Final diagnoses:  Shortness of breath  Panic attack      Eber HongMiller, Jennel Mara, MD 03/02/18 2028

## 2018-03-02 NOTE — ED Triage Notes (Signed)
Pt was sitting home and states he became very sob.  States having mild cough x 3 days.  HX of panic attacks and states he feels like this is another panic attack

## 2018-03-02 NOTE — Discharge Instructions (Signed)
[facility] Doctor List    Edward Hawkins MD. Specialty: Pulmonary Disease Contact information: [address]  [address]  [place] [address]  [phone]   Margaret Simpson, MD. Specialty: Family Medicine Contact information: [address]  [place] [address]  [phone]   Scott Luking, MD. Specialty: Family Medicine Contact information: [address]  [address]  [place] [address]  [phone]   Tesfaye Fanta, MD Specialty: Internal Medicine Contact information: [address]  [place] [address]  [phone]   Zach Hall, MD. Specialty: Internal Medicine Contact information: [address]  [place] [address]  [phone]    [facility] (Dr. Kim) Specialty: Family Medicine Contact information: [address]  [place] [address]  [phone]   Stephen Knowlton, MD. Specialty: Family Medicine Contact information: [address]  [address]  [place] [address]  [phone]   Roy Fagan, MD. Specialty: Internal Medicine Contact information: [address]  [address]  [place] [address]  [phone]    [facility]  [address] [place],  [address] [phone]  Services [facility] offers a variety of basic health services.  Services include but are not limited to: Blood pressure checks  Heart rate checks  Blood sugar checks  Urine analysis  Rapid strep tests  Pregnancy tests.  Health education and referrals  People needing more complex services will be directed to a physician online. Using these virtual visits, doctors can evaluate and prescribe medicine and treatments. There will be no medication on-site, though [facility] will help patients fill their prescriptions at little to no cost.   For More information please go  to: [email or web address]  

## 2018-10-19 ENCOUNTER — Encounter: Payer: Self-pay | Admitting: General Surgery

## 2018-10-19 ENCOUNTER — Other Ambulatory Visit: Payer: Self-pay

## 2018-10-19 ENCOUNTER — Ambulatory Visit (INDEPENDENT_AMBULATORY_CARE_PROVIDER_SITE_OTHER): Payer: Medicare Other | Admitting: General Surgery

## 2018-10-19 VITALS — BP 121/85 | HR 75 | Temp 98.0°F | Resp 16 | Ht 76.0 in | Wt 234.0 lb

## 2018-10-19 DIAGNOSIS — K432 Incisional hernia without obstruction or gangrene: Secondary | ICD-10-CM

## 2018-10-19 NOTE — Patient Instructions (Signed)
Ventral Hernia  A ventral hernia is a bulge of tissue from inside the abdomen that pushes through a weak area of the muscles that form the front wall of the abdomen. The tissues inside the abdomen are inside a sac (peritoneum). These tissues include the small intestine, large intestine, and the fatty tissue that covers the intestines (omentum). Sometimes, the bulge that forms a hernia contains intestines. Other hernias contain only fat. Ventral hernias do not go away without surgical treatment. There are several types of ventral hernias. You may have:  A hernia at an incision site from previous abdominal surgery (incisional hernia).  A hernia just above the belly button (epigastric hernia), or at the belly button (umbilical hernia). These types of hernias can develop from heavy lifting or straining.  A hernia that comes and goes (reducible hernia). It may be visible only when you lift or strain. This type of hernia can be pushed back into the abdomen (reduced).  A hernia that traps abdominal tissue inside the hernia (incarcerated hernia). This type of hernia does not reduce.  A hernia that cuts off blood flow to the tissues inside the hernia (strangulated hernia). The tissues can start to die if this happens. This is a very painful bulge that cannot be reduced. A strangulated hernia is a medical emergency. What are the causes? This condition is caused by abdominal tissue putting pressure on an area of weakness in the abdominal muscles. What increases the risk? The following factors may make you more likely to develop this condition:  Being male.  Being 60 or older.  Being overweight or obese.  Having had previous abdominal surgery, especially if there was an infection after surgery.  Having had an injury to the abdominal wall.  Having had several pregnancies.  Having a buildup of fluid inside the abdomen (ascites). What are the signs or symptoms? The only symptom of a ventral hernia  may be a painless bulge in the abdomen. A reducible hernia may be visible only when you strain, cough, or lift. Other symptoms may include:  Dull pain.  A feeling of pressure. Signs and symptoms of a strangulated hernia may include:  Increasing pain.  Nausea and vomiting.  Pain when pressing on the hernia.  The skin over the hernia turning red or purple.  Constipation.  Blood in the stool (feces). How is this diagnosed? This condition may be diagnosed based on:  Your symptoms.  Your medical history.  A physical exam. You may be asked to cough or strain while standing. These actions increase the pressure inside your abdomen and force the hernia through the opening in your muscles. Your health care provider may try to reduce the hernia by pressing on it.  Imaging studies, such as an ultrasound or CT scan. How is this treated? This condition is treated with surgery. If you have a strangulated hernia, surgery is done as soon as possible. If your hernia is small and not incarcerated, you may be asked to lose some weight before surgery. Follow these instructions at home:  Follow instructions from your health care provider about eating or drinking restrictions.  If you are overweight, your health care provider may recommend that you increase your activity level and eat a healthier diet.  Do not lift anything that is heavier than 10 lb (4.5 kg).  Return to your normal activities as told by your health care provider. Ask your health care provider what activities are safe for you. You may need to avoid activities   that increase pressure on your hernia.  Take over-the-counter and prescription medicines only as told by your health care provider.  Keep all follow-up visits as told by your health care provider. This is important. Contact a health care provider if:  Your hernia gets larger.  Your hernia becomes painful. Get help right away if:  Your hernia becomes increasingly  painful.  You have pain along with any of the following: ? Changes in skin color in the area of the hernia. ? Nausea. ? Vomiting. ? Fever. Summary  A ventral hernia is a bulge of tissue from inside the abdomen that pushes through a weak area of the muscles that form the front wall of the abdomen.  This condition is treated with surgery, which may be urgent depending on your hernia.  Do not lift anything that is heavier than 10 lb (4.5 kg), and follow activity instructions from your health care provider. This information is not intended to replace advice given to you by your health care provider. Make sure you discuss any questions you have with your health care provider. Document Released: 03/17/2012 Document Revised: 05/13/2017 Document Reviewed: 10/20/2016 Elsevier Patient Education  2020 Elsevier Inc.  

## 2018-10-19 NOTE — Progress Notes (Signed)
[name]; [id number]; May 30, 1960   HPI Patient is a 58 year old white male who was referred to my care by Dr. Maudie Mercury for evaluation and treatment of a recurrent umbilical hernia.  Patient is status post laparoscopic cholecystectomy and an umbilical herniorrhaphy with mesh in the past.  He states that he recurred soon after the umbilical hernia surgery was done.  He has had increasing pain and swelling in the umbilicus.  He denies any nausea or vomiting.  His history is significant for protein S deficiency requiring Xarelto.  He states he has 7 out of 10 pain.  He is active and is made worse with straining. Past Medical History:  Diagnosis Date  . Anxiety   . Arthritis   . Asthma   . Bipolar disorder ([place])   . Chronic pain   . COPD (chronic obstructive pulmonary disease) ([place])   . Degenerative disk disease   . GERD (gastroesophageal reflux disease)   . Hiatal hernia   . History of pneumonia   . PE (pulmonary embolism)   . Protein S deficiency [facility])     Past Surgical History:  Procedure Laterality Date  . CHOLECYSTECTOMY    . CLOSED REDUCTION MANDIBLE WITH MANDIBULOMA  10/21/2011   Procedure: CLOSED REDUCTION MANDIBLE WITH MANDIBULOMAXILLARY FUSION;  Surgeon: Ascencion Dike, MD;  Location: [facility];  Service: ENT;  Laterality: N/A;  MMF screws  . HAND SURGERY Right 20 years ago  . LEG SURGERY    . MANDIBULAR HARDWARE REMOVAL  11/11/2011   Procedure: MANDIBULAR HARDWARE REMOVAL;  Surgeon: Ascencion Dike, MD;  Location: [facility];  Service: ENT;  Laterality: N/A;  . ORIF MANDIBULAR FRACTURE  10/21/2011   Procedure: OPEN REDUCTION INTERNAL FIXATION (ORIF) MANDIBULAR FRACTURE;  Surgeon: Ascencion Dike, MD;  [facility] OR;  Service: ENT;  Laterality: N/A;  . UMBILICAL HERNIA REPAIR      Family History  Problem Relation Age of Onset  . Coronary artery disease Mother   . Coronary artery disease Father   . Diabetes Other     Current Outpatient Medications on File Prior to Visit   Medication Sig Dispense Refill  . albuterol (PROVENTIL HFA;VENTOLIN HFA) 108 (90 BASE) MCG/ACT inhaler Inhale 2 puffs into the lungs every 4 (four) hours as needed. FOR SHORTNESS OF BREATH    . ALPRAZolam (XANAX) 1 MG tablet     . citalopram (CELEXA) 40 MG tablet Take 40 mg by mouth daily.    . Milk Thistle 500 MG CAPS Take by mouth.    Marland Kitchen omeprazole (PRILOSEC) 40 MG capsule Take 40 mg by mouth daily.    . QUEtiapine (SEROQUEL) 300 MG tablet Take 1 tablet by mouth at bedtime.     . Rivaroxaban (XARELTO) 20 MG TABS Take 20 mg by mouth daily.    . SUBOXONE 8-2 MG FILM     . budesonide-formoterol (SYMBICORT) 160-4.5 MCG/ACT inhaler Inhale 2 puffs into the lungs 2 (two) times daily. (Patient not taking: Reported on 10/19/2018) 1 Inhaler 3  . clonazePAM (KLONOPIN) 1 MG tablet Take 0.5-1 mg by mouth 4 (four) times daily.     . ondansetron (ZOFRAN) 8 MG tablet Take 8 mg by mouth every 6 (six) hours as needed for vomiting.   2  . risperiDONE (RISPERDAL) 1 MG tablet Take 1 mg by mouth at bedtime.  11   No current facility-administered medications on file prior to visit.     No Known Allergies  Social History   Substance  and Sexual Activity  Alcohol Use No  . Alcohol/week: 0.0 standard drinks    Social History   Tobacco Use  Smoking Status Current Every Day Smoker  . Packs/day: 0.50  . Years: 35.00  . Pack years: 17.50  . Types: Cigarettes  Smokeless Tobacco Never Used    Review of Systems  Constitutional: Positive for malaise/fatigue.  HENT: Positive for sinus pain.   Eyes: Negative.   Respiratory: Positive for cough, shortness of breath and wheezing.   Cardiovascular: Negative.   Gastrointestinal: Positive for abdominal pain and heartburn.  Genitourinary: Positive for frequency.  Musculoskeletal: Positive for back pain and joint pain.  Skin: Negative.   Neurological: Negative.   Endo/Heme/Allergies: Bruises/bleeds easily.  Psychiatric/Behavioral: Negative.     Objective    Vitals:   10/19/18 1420  BP: 121/85  Pulse: 75  Resp: 16  Temp: 98 F (36.7 C)  SpO2: 92%    Physical Exam Vitals signs reviewed.  Constitutional:      Appearance: Normal appearance. He is not ill-appearing.  HENT:     Head: Normocephalic and atraumatic.  Cardiovascular:     Rate and Rhythm: Normal rate and regular rhythm.     Heart sounds: Normal heart sounds. No murmur. No friction rub. No gallop.   Pulmonary:     Effort: Pulmonary effort is normal. No respiratory distress.     Breath sounds: Normal breath sounds. No stridor. No wheezing, rhonchi or rales.  Abdominal:     General: Bowel sounds are normal. There is no distension.     Palpations: Abdomen is soft. There is no mass.     Tenderness: There is no abdominal tenderness. There is no guarding or rebound.     Hernia: A hernia is present.     Comments: Umbilical hernia present below a surgical scar.  The skin is thin over the hernia.  It is reducible.  The defect is approximately 2 cm in size.  Skin:    General: Skin is warm and dry.  Neurological:     Mental Status: He is alert and oriented to person, place, and time.    Primary care notes reviewed Assessment  Incisional hernia, on chronic anticoagulation for protein S deficiency Plan   Will discuss with hematology concerning bridging this patient prior to surgical intervention.  He does remember having to give himself injections in the past prior to another procedure.  The risks and benefits of the procedure including bleeding, infection, mesh use, and the possibility of recurrence of the hernia were fully explained to the patient, who gave informed consent.

## 2018-10-21 ENCOUNTER — Other Ambulatory Visit: Payer: Self-pay | Admitting: General Surgery

## 2018-10-21 MED ORDER — ENOXAPARIN SODIUM 30 MG/0.3ML ~~LOC~~ SOLN: 100.0000 mg | Freq: Two times a day (BID) | SUBCUTANEOUS | 0 refills | Status: DC

## 2018-10-22 NOTE — Patient Instructions (Signed)
[name]  10/22/2018     @PREFPERIOPPHARMACY @   Your procedure is scheduled on  10/27/2018.  Report to [facility] at  830  A.M.  Call this number if you have problems the morning of surgery:  [phone]   Remember:  Do not eat or drink after midnight.                        Take these medicines the morning of surgery with A SIP OF WATER Xanax (if needed), celexa, klonopin, prilosec, zofran( if needed), suboxone.    Do not wear jewelry, make-up or nail polish.  Do not wear lotions, powders, or perfumes, or deodorant.  Do not shave 48 hours prior to surgery.  Men may shave face and neck.  Do not bring valuables to the hospital.  [facility] is not responsible for any belongings or valuables.  Contacts, dentures or bridgework may not be worn into surgery.  Leave your suitcase in the car.  After surgery it may be brought to your room.  For patients admitted to the hospital, discharge time will be determined by your treatment team.  Patients discharged the day of surgery will not be allowed to drive home.   Name and phone number of your driver:   Family   Special instructions:  STOP XARELTO Monday 10/25/2018. START LOVENOX AND USE IT Monday AND Tuesday. YOU MAY RESTART YOUR XARELTO 10/28/2018.  Please read over the following fact sheets that you were given. Anesthesia Post-op Instructions and Care and Recovery After Surgery       Open Hernia Repair, Adult, Care After These instructions give you information about caring for yourself after your procedure. Your doctor may also give you more specific instructions. If you have problems or questions, contact your doctor. Follow these instructions at home: Surgical cut (incision) care   Follow instructions from your doctor about how to take care of your surgical cut area. Make sure you: ? Wash your hands with soap and water before you change your bandage (dressing). If you cannot use soap and water, use hand  sanitizer. ? Change your bandage as told by your doctor. ? Leave stitches (sutures), skin glue, or skin tape (adhesive) strips in place. They may need to stay in place for 2 weeks or longer. If tape strips get loose and curl up, you may trim the loose edges. Do not remove tape strips completely unless your doctor says it is okay.  Check your surgical cut every day for signs of infection. Check for: ? More redness, swelling, or pain. ? More fluid or blood. ? Warmth. ? Pus or a bad smell. Activity  Do not drive or use heavy machinery while taking prescription pain medicine. Do not drive until your doctor says it is okay.  Until your doctor says it is okay: ? Do not lift anything that is heavier than 10 lb (4.5 kg). ? Do not play contact sports.  Return to your normal activities as told by your doctor. Ask your doctor what activities are safe. General instructions  To prevent or treat having a hard time pooping (constipation) while you are taking prescription pain medicine, your doctor may recommend that you: ? Drink enough fluid to keep your pee (urine) clear or pale yellow. ? Take over-the-counter or prescription medicines. ? Eat foods that are high in fiber, such as fresh fruits and vegetables, whole grains, and beans. ? Limit  foods that are high in fat and processed sugars, such as fried and sweet foods.  Take over-the-counter and prescription medicines only as told by your doctor.  Do not take baths, swim, or use a hot tub until your doctor says it is okay.  Keep all follow-up visits as told by your doctor. This is important. Contact a doctor if:  You develop a rash.  You have more redness, swelling, or pain around your surgical cut.  You have more fluid or blood coming from your surgical cut.  Your surgical cut feels warm to the touch.  You have pus or a bad smell coming from your surgical cut.  You have a fever or chills.  You have blood in your poop (stool).  You  have not pooped in 2-3 days.  Medicine does not help your pain. Get help right away if:  You have chest pain or you are short of breath.  You feel light-headed.  You feel weak and dizzy (feel faint).  You have very bad pain.  You throw up (vomit) and your pain is worse. This information is not intended to replace advice given to you by your health care provider. Make sure you discuss any questions you have with your health care provider. Document Released: 04/21/2014 Document Revised: 07/23/2018 Document Reviewed: 09/12/2015 Elsevier Patient Education  2020 [employer] Anesthesia, Adult, Care After This sheet gives you information about how to care for yourself after your procedure. Your health care provider may also give you more specific instructions. If you have problems or questions, contact your health care provider. What can I expect after the procedure? After the procedure, the following side effects are common:  Pain or discomfort at the IV site.  Nausea.  Vomiting.  Sore throat.  Trouble concentrating.  Feeling cold or chills.  Weak or tired.  Sleepiness and fatigue.  Soreness and body aches. These side effects can affect parts of the body that were not involved in surgery. Follow these instructions at home:  For at least 24 hours after the procedure:  Have a responsible adult stay with you. It is important to have someone help care for you until you are awake and alert.  Rest as needed.  Do not: ? Participate in activities in which you could fall or become injured. ? Drive. ? Use heavy machinery. ? Drink alcohol. ? Take sleeping pills or medicines that cause drowsiness. ? Make important decisions or sign legal documents. ? Take care of children on your own. Eating and drinking  Follow any instructions from your health care provider about eating or drinking restrictions.  When you feel hungry, start by eating small amounts of foods that  are soft and easy to digest (bland), such as toast. Gradually return to your regular diet.  Drink enough fluid to keep your urine pale yellow.  If you vomit, rehydrate by drinking water, juice, or clear broth. General instructions  If you have sleep apnea, surgery and certain medicines can increase your risk for breathing problems. Follow instructions from your health care provider about wearing your sleep device: ? Anytime you are sleeping, including during daytime naps. ? While taking prescription pain medicines, sleeping medicines, or medicines that make you drowsy.  Return to your normal activities as told by your health care provider. Ask your health care provider what activities are safe for you.  Take over-the-counter and prescription medicines only as told by your health care provider.  If you smoke, do not  smoke without supervision.  Keep all follow-up visits as told by your health care provider. This is important. Contact a health care provider if:  You have nausea or vomiting that does not get better with medicine.  You cannot eat or drink without vomiting.  You have pain that does not get better with medicine.  You are unable to pass urine.  You develop a skin rash.  You have a fever.  You have redness around your IV site that gets worse. Get help right away if:  You have difficulty breathing.  You have chest pain.  You have blood in your urine or stool, or you vomit blood. Summary  After the procedure, it is common to have a sore throat or nausea. It is also common to feel tired.  Have a responsible adult stay with you for the first 24 hours after general anesthesia. It is important to have someone help care for you until you are awake and alert.  When you feel hungry, start by eating small amounts of foods that are soft and easy to digest (bland), such as toast. Gradually return to your regular diet.  Drink enough fluid to keep your urine pale yellow.   Return to your normal activities as told by your health care provider. Ask your health care provider what activities are safe for you. This information is not intended to replace advice given to you by your health care provider. Make sure you discuss any questions you have with your health care provider. Document Released: 07/07/2000 Document Revised: 04/03/2017 Document Reviewed: 11/14/2016 Elsevier Patient Education  2020 [employer]. How to Use Chlorhexidine for Bathing Chlorhexidine gluconate (CHG) is a germ-killing (antiseptic) solution that is used to clean the skin. It can get rid of the bacteria that normally live on the skin and can keep them away for about 24 hours. To clean your skin with CHG, you may be given:  A CHG solution to use in the shower or as part of a sponge bath.  A prepackaged cloth that contains CHG. Cleaning your skin with CHG may help lower the risk for infection:  While you are staying in the intensive care unit of the hospital.  If you have a vascular access, such as a central line, to provide short-term or long-term access to your veins.  If you have a catheter to drain urine from your bladder.  If you are on a ventilator. A ventilator is a machine that helps you breathe by moving air in and out of your lungs.  After surgery. What are the risks? Risks of using CHG include:  A skin reaction.  Hearing loss, if CHG gets in your ears.  Eye injury, if CHG gets in your eyes and is not rinsed out.  The CHG product catching fire. Make sure that you avoid smoking and flames after applying CHG to your skin. Do not use CHG:  If you have a chlorhexidine allergy or have previously reacted to chlorhexidine.  On babies younger than 54 months of age. How to use CHG solution  Use CHG only as told by your health care provider, and follow the instructions on the label.  Use the full amount of CHG as directed. Usually, this is one bottle. During a shower Follow  these steps when using CHG solution during a shower (unless your health care provider gives you different instructions): 1. Start the shower. 2. Use your normal soap and shampoo to wash your face and hair. 3. Turn off  the shower or move out of the shower stream. 4. Pour the CHG onto a clean washcloth. Do not use any type of brush or rough-edged sponge. 5. Starting at your neck, lather your body down to your toes. Make sure you follow these instructions: ? If you will be having surgery, pay special attention to the part of your body where you will be having surgery. Scrub this area for at least 1 minute. ? Do not use CHG on your head or face. If the solution gets into your ears or eyes, rinse them well with water. ? Avoid your genital area. ? Avoid any areas of skin that have broken skin, cuts, or scrapes. ? Scrub your back and under your arms. Make sure to wash skin folds. 6. Let the lather sit on your skin for 1-2 minutes or as long as told by your health care provider. 7. Thoroughly rinse your entire body in the shower. Make sure that all body creases and crevices are rinsed well. 8. Dry off with a clean towel. Do not put any substances on your body afterward-such as powder, lotion, or perfume-unless you are told to do so by your health care provider. Only use lotions that are recommended by the manufacturer. 9. Put on clean clothes or pajamas. 10. If it is the night before your surgery, sleep in clean sheets.  During a sponge bath Follow these steps when using CHG solution during a sponge bath (unless your health care provider gives you different instructions): 1. Use your normal soap and shampoo to wash your face and hair. 2. Pour the CHG onto a clean washcloth. 3. Starting at your neck, lather your body down to your toes. Make sure you follow these instructions: ? If you will be having surgery, pay special attention to the part of your body where you will be having surgery. Scrub this area  for at least 1 minute. ? Do not use CHG on your head or face. If the solution gets into your ears or eyes, rinse them well with water. ? Avoid your genital area. ? Avoid any areas of skin that have broken skin, cuts, or scrapes. ? Scrub your back and under your arms. Make sure to wash skin folds. 4. Let the lather sit on your skin for 1-2 minutes or as long as told by your health care provider. 5. Using a different clean, wet washcloth, thoroughly rinse your entire body. Make sure that all body creases and crevices are rinsed well. 6. Dry off with a clean towel. Do not put any substances on your body afterward-such as powder, lotion, or perfume-unless you are told to do so by your health care provider. Only use lotions that are recommended by the manufacturer. 7. Put on clean clothes or pajamas. 8. If it is the night before your surgery, sleep in clean sheets. How to use CHG prepackaged cloths  Only use CHG cloths as told by your health care provider, and follow the instructions on the label.  Use the CHG cloth on clean, dry skin.  Do not use the CHG cloth on your head or face unless your health care provider tells you to.  When washing with the CHG cloth: ? Avoid your genital area. ? Avoid any areas of skin that have broken skin, cuts, or scrapes. Before surgery Follow these steps when using a CHG cloth to clean before surgery (unless your health care provider gives you different instructions): 1. Using the CHG cloth, vigorously scrub the part  of your body where you will be having surgery. Scrub using a back-and-forth motion for 3 minutes. The area on your body should be completely wet with CHG when you are done scrubbing. 2. Do not rinse. Discard the cloth and let the area air-dry. Do not put any substances on the area afterward, such as powder, lotion, or perfume. 3. Put on clean clothes or pajamas. 4. If it is the night before your surgery, sleep in clean sheets.  For general bathing  Follow these steps when using CHG cloths for general bathing (unless your health care provider gives you different instructions). 1. Use a separate CHG cloth for each area of your body. Make sure you wash between any folds of skin and between your fingers and toes. Wash your body in the following order, switching to a new cloth after each step: ? The front of your neck, shoulders, and chest. ? Both of your arms, under your arms, and your hands. ? Your stomach and groin area, avoiding the genitals. ? Your right leg and foot. ? Your left leg and foot. ? The back of your neck, your back, and your buttocks. 2. Do not rinse. Discard the cloth and let the area air-dry. Do not put any substances on your body afterward-such as powder, lotion, or perfume-unless you are told to do so by your health care provider. Only use lotions that are recommended by the manufacturer. 3. Put on clean clothes or pajamas. Contact a health care provider if:  Your skin gets irritated after scrubbing.  You have questions about using your solution or cloth. Get help right away if:  Your eyes become very red or swollen.  Your eyes itch badly.  Your skin itches badly and is red or swollen.  Your hearing changes.  You have trouble seeing.  You have swelling or tingling in your mouth or throat.  You have trouble breathing.  You swallow any chlorhexidine. Summary  Chlorhexidine gluconate (CHG) is a germ-killing (antiseptic) solution that is used to clean the skin. Cleaning your skin with CHG may help to lower your risk for infection.  You may be given CHG to use for bathing. It may be in a bottle or in a prepackaged cloth to use on your skin. Carefully follow your health care provider's instructions and the instructions on the product label.  Do not use CHG if you have a chlorhexidine allergy.  Contact your health care provider if your skin gets irritated after scrubbing. This information is not intended to  replace advice given to you by your health care provider. Make sure you discuss any questions you have with your health care provider. Document Released: 12/24/2011 Document Revised: 06/17/2018 Document Reviewed: 02/26/2017 Elsevier Patient Education  2020 [employer].

## 2018-10-25 ENCOUNTER — Other Ambulatory Visit: Payer: Self-pay

## 2018-10-25 ENCOUNTER — Encounter (HOSPITAL_COMMUNITY): Payer: Self-pay

## 2018-10-25 ENCOUNTER — Other Ambulatory Visit (HOSPITAL_COMMUNITY)
Admission: RE | Admit: 2018-10-25 | Discharge: 2018-10-25 | Disposition: A | Discharge status: Home / Self Care | Payer: Medicare Other | Source: Ambulatory Visit | Attending: General Surgery | Admitting: General Surgery

## 2018-10-25 ENCOUNTER — Encounter (HOSPITAL_COMMUNITY)
Admission: RE | Admit: 2018-10-25 | Discharge: 2018-10-25 | Disposition: A | Discharge status: Home / Self Care | Payer: Medicare Other | Source: Ambulatory Visit | Attending: General Surgery | Admitting: General Surgery

## 2018-10-25 DIAGNOSIS — F17210 Nicotine dependence, cigarettes, uncomplicated: Secondary | ICD-10-CM | POA: Diagnosis not present

## 2018-10-25 DIAGNOSIS — J449 Chronic obstructive pulmonary disease, unspecified: Secondary | ICD-10-CM | POA: Diagnosis not present

## 2018-10-25 DIAGNOSIS — Z86711 Personal history of pulmonary embolism: Secondary | ICD-10-CM | POA: Diagnosis not present

## 2018-10-25 DIAGNOSIS — F419 Anxiety disorder, unspecified: Secondary | ICD-10-CM | POA: Diagnosis not present

## 2018-10-25 DIAGNOSIS — Z1159 Encounter for screening for other viral diseases: Secondary | ICD-10-CM | POA: Diagnosis not present

## 2018-10-25 DIAGNOSIS — Z7901 Long term (current) use of anticoagulants: Secondary | ICD-10-CM | POA: Diagnosis not present

## 2018-10-25 DIAGNOSIS — K432 Incisional hernia without obstruction or gangrene: Secondary | ICD-10-CM | POA: Diagnosis not present

## 2018-10-25 DIAGNOSIS — D6859 Other primary thrombophilia: Secondary | ICD-10-CM | POA: Diagnosis not present

## 2018-10-25 DIAGNOSIS — K219 Gastro-esophageal reflux disease without esophagitis: Secondary | ICD-10-CM | POA: Diagnosis not present

## 2018-10-25 DIAGNOSIS — F319 Bipolar disorder, unspecified: Secondary | ICD-10-CM | POA: Diagnosis not present

## 2018-10-25 DIAGNOSIS — K449 Diaphragmatic hernia without obstruction or gangrene: Secondary | ICD-10-CM | POA: Diagnosis not present

## 2018-10-25 DIAGNOSIS — G8929 Other chronic pain: Secondary | ICD-10-CM | POA: Diagnosis not present

## 2018-10-25 DIAGNOSIS — Z79899 Other long term (current) drug therapy: Secondary | ICD-10-CM | POA: Diagnosis not present

## 2018-10-25 DIAGNOSIS — M1991 Primary osteoarthritis, unspecified site: Secondary | ICD-10-CM | POA: Diagnosis not present

## 2018-10-25 LAB — BASIC METABOLIC PANEL
Anion gap: 11 (ref 5–15)
BUN: 17 mg/dL (ref 6–20)
CO2: 24 mmol/L (ref 22–32)
Calcium: 9.4 mg/dL (ref 8.9–10.3)
Chloride: 102 mmol/L (ref 98–111)
Creatinine, Ser: 0.75 mg/dL (ref 0.61–1.24)
GFR calc Af Amer: >60 mL/min (ref >60)
GFR calc non Af Amer: >60 mL/min (ref >60)
Glucose, Bld: 114 mg/dL — ABNORMAL HIGH (ref 70–99)
Potassium: 4.1 mmol/L (ref 3.5–5.1)
Sodium: 137 mmol/L (ref 135–145)

## 2018-10-25 LAB — CBC WITH DIFFERENTIAL/PLATELET
Abs Immature Granulocytes: 0.02 10*3/uL (ref 0.00–0.07)
Basophils Absolute: 0 10*3/uL (ref 0.0–0.1)
Basophils Relative: 1 %
Eosinophils Absolute: 0.1 10*3/uL (ref 0.0–0.5)
Eosinophils Relative: 1 %
HCT: 46.3 % (ref 39.0–52.0)
Hemoglobin: 14.9 g/dL (ref 13.0–17.0)
Immature Granulocytes: 0 %
Lymphocytes Relative: 19 %
Lymphs Abs: 1.2 10*3/uL (ref 0.7–4.0)
MCH: 29.9 pg (ref 26.0–34.0)
MCHC: 32.2 g/dL (ref 30.0–36.0)
MCV: 93 fL (ref 80.0–100.0)
Monocytes Absolute: 0.3 10*3/uL (ref 0.1–1.0)
Monocytes Relative: 6 %
Neutro Abs: 4.5 10*3/uL (ref 1.7–7.7)
Neutrophils Relative %: 73 %
Platelets: 264 10*3/uL (ref 150–400)
RBC: 4.98 MIL/uL (ref 4.22–5.81)
RDW: 12.7 % (ref 11.5–15.5)
WBC: 6.2 10*3/uL (ref 4.0–10.5)
nRBC: 0 % (ref 0.0–0.2)

## 2018-10-25 NOTE — H&P (Signed)
[name]; [id number]; May 30, 1960   HPI Patient is a 58 year old white male who was referred to my care by Dr. Maudie Mercury for evaluation and treatment of a recurrent umbilical hernia.  Patient is status post laparoscopic cholecystectomy and an umbilical herniorrhaphy with mesh in the past.  He states that he recurred soon after the umbilical hernia surgery was done.  He has had increasing pain and swelling in the umbilicus.  He denies any nausea or vomiting.  His history is significant for protein S deficiency requiring Xarelto.  He states he has 7 out of 10 pain.  He is active and is made worse with straining. Past Medical History:  Diagnosis Date  . Anxiety   . Arthritis   . Asthma   . Bipolar disorder ([place])   . Chronic pain   . COPD (chronic obstructive pulmonary disease) ([place])   . Degenerative disk disease   . GERD (gastroesophageal reflux disease)   . Hiatal hernia   . History of pneumonia   . PE (pulmonary embolism)   . Protein S deficiency [facility])     Past Surgical History:  Procedure Laterality Date  . CHOLECYSTECTOMY    . CLOSED REDUCTION MANDIBLE WITH MANDIBULOMA  10/21/2011   Procedure: CLOSED REDUCTION MANDIBLE WITH MANDIBULOMAXILLARY FUSION;  Surgeon: Ascencion Dike, MD;  Location: [facility];  Service: ENT;  Laterality: N/A;  MMF screws  . HAND SURGERY Right 20 years ago  . LEG SURGERY    . MANDIBULAR HARDWARE REMOVAL  11/11/2011   Procedure: MANDIBULAR HARDWARE REMOVAL;  Surgeon: Ascencion Dike, MD;  Location: [facility];  Service: ENT;  Laterality: N/A;  . ORIF MANDIBULAR FRACTURE  10/21/2011   Procedure: OPEN REDUCTION INTERNAL FIXATION (ORIF) MANDIBULAR FRACTURE;  Surgeon: Ascencion Dike, MD;  [facility];  Service: ENT;  Laterality: N/A;  . UMBILICAL HERNIA REPAIR      Family History  Problem Relation Age of Onset  . Coronary artery disease Mother   . Coronary artery disease Father   . Diabetes Other     Current Outpatient Medications on File Prior to Visit   Medication Sig Dispense Refill  . albuterol (PROVENTIL HFA;VENTOLIN HFA) 108 (90 BASE) MCG/ACT inhaler Inhale 2 puffs into the lungs every 4 (four) hours as needed. FOR SHORTNESS OF BREATH    . ALPRAZolam (XANAX) 1 MG tablet     . citalopram (CELEXA) 40 MG tablet Take 40 mg by mouth daily.    . Milk Thistle 500 MG CAPS Take by mouth.    Marland Kitchen omeprazole (PRILOSEC) 40 MG capsule Take 40 mg by mouth daily.    . QUEtiapine (SEROQUEL) 300 MG tablet Take 1 tablet by mouth at bedtime.     . Rivaroxaban (XARELTO) 20 MG TABS Take 20 mg by mouth daily.    . SUBOXONE 8-2 MG FILM     . budesonide-formoterol (SYMBICORT) 160-4.5 MCG/ACT inhaler Inhale 2 puffs into the lungs 2 (two) times daily. (Patient not taking: Reported on 10/19/2018) 1 Inhaler 3  . clonazePAM (KLONOPIN) 1 MG tablet Take 0.5-1 mg by mouth 4 (four) times daily.     . ondansetron (ZOFRAN) 8 MG tablet Take 8 mg by mouth every 6 (six) hours as needed for vomiting.   2  . risperiDONE (RISPERDAL) 1 MG tablet Take 1 mg by mouth at bedtime.  11   No current facility-administered medications on file prior to visit.     No Known Allergies  Social History   Substance  and Sexual Activity  Alcohol Use No  . Alcohol/week: 0.0 standard drinks    Social History   Tobacco Use  Smoking Status Current Every Day Smoker  . Packs/day: 0.50  . Years: 35.00  . Pack years: 17.50  . Types: Cigarettes  Smokeless Tobacco Never Used    Review of Systems  Constitutional: Positive for malaise/fatigue.  HENT: Positive for sinus pain.   Eyes: Negative.   Respiratory: Positive for cough, shortness of breath and wheezing.   Cardiovascular: Negative.   Gastrointestinal: Positive for abdominal pain and heartburn.  Genitourinary: Positive for frequency.  Musculoskeletal: Positive for back pain and joint pain.  Skin: Negative.   Neurological: Negative.   Endo/Heme/Allergies: Bruises/bleeds easily.  Psychiatric/Behavioral: Negative.     Objective    Vitals:   10/19/18 1420  BP: 121/85  Pulse: 75  Resp: 16  Temp: 98 F (36.7 C)  SpO2: 92%    Physical Exam Vitals signs reviewed.  Constitutional:      Appearance: Normal appearance. He is not ill-appearing.  HENT:     Head: Normocephalic and atraumatic.  Cardiovascular:     Rate and Rhythm: Normal rate and regular rhythm.     Heart sounds: Normal heart sounds. No murmur. No friction rub. No gallop.   Pulmonary:     Effort: Pulmonary effort is normal. No respiratory distress.     Breath sounds: Normal breath sounds. No stridor. No wheezing, rhonchi or rales.  Abdominal:     General: Bowel sounds are normal. There is no distension.     Palpations: Abdomen is soft. There is no mass.     Tenderness: There is no abdominal tenderness. There is no guarding or rebound.     Hernia: A hernia is present.     Comments: Umbilical hernia present below a surgical scar.  The skin is thin over the hernia.  It is reducible.  The defect is approximately 2 cm in size.  Skin:    General: Skin is warm and dry.  Neurological:     Mental Status: He is alert and oriented to person, place, and time.    Primary care notes reviewed Assessment  Incisional hernia, on chronic anticoagulation for protein S deficiency Plan   Will discuss with hematology concerning bridging this patient prior to surgical intervention.  He does remember having to give himself injections in the past prior to another procedure.  The risks and benefits of the procedure including bleeding, infection, mesh use, and the possibility of recurrence of the hernia were fully explained to the patient, who gave informed consent. 

## 2018-10-26 LAB — SARS CORONAVIRUS 2 (TAT 6-24 HRS): SARS Coronavirus 2: NEGATIVE

## 2018-10-27 ENCOUNTER — Other Ambulatory Visit: Payer: Self-pay

## 2018-10-27 ENCOUNTER — Encounter (HOSPITAL_COMMUNITY): Payer: Self-pay | Admitting: Anesthesiology

## 2018-10-27 ENCOUNTER — Ambulatory Visit (HOSPITAL_COMMUNITY): Payer: Medicare Other | Admitting: Anesthesiology

## 2018-10-27 ENCOUNTER — Encounter (HOSPITAL_COMMUNITY)
Admission: RE | Disposition: A | Discharge status: Home / Self Care | Payer: Self-pay | Source: Home / Self Care | Attending: General Surgery

## 2018-10-27 ENCOUNTER — Ambulatory Visit (HOSPITAL_COMMUNITY)
Admission: RE | Admit: 2018-10-27 | Discharge: 2018-10-27 | Disposition: A | Discharge status: Home / Self Care | Payer: Medicare Other | Attending: General Surgery | Admitting: General Surgery

## 2018-10-27 DIAGNOSIS — K432 Incisional hernia without obstruction or gangrene: Secondary | ICD-10-CM

## 2018-10-27 DIAGNOSIS — Z86711 Personal history of pulmonary embolism: Secondary | ICD-10-CM | POA: Insufficient documentation

## 2018-10-27 DIAGNOSIS — Z7901 Long term (current) use of anticoagulants: Secondary | ICD-10-CM | POA: Insufficient documentation

## 2018-10-27 DIAGNOSIS — F419 Anxiety disorder, unspecified: Secondary | ICD-10-CM | POA: Insufficient documentation

## 2018-10-27 DIAGNOSIS — F17210 Nicotine dependence, cigarettes, uncomplicated: Secondary | ICD-10-CM | POA: Insufficient documentation

## 2018-10-27 DIAGNOSIS — K449 Diaphragmatic hernia without obstruction or gangrene: Secondary | ICD-10-CM | POA: Insufficient documentation

## 2018-10-27 DIAGNOSIS — F319 Bipolar disorder, unspecified: Secondary | ICD-10-CM | POA: Insufficient documentation

## 2018-10-27 DIAGNOSIS — M1991 Primary osteoarthritis, unspecified site: Secondary | ICD-10-CM | POA: Insufficient documentation

## 2018-10-27 DIAGNOSIS — K219 Gastro-esophageal reflux disease without esophagitis: Secondary | ICD-10-CM | POA: Insufficient documentation

## 2018-10-27 DIAGNOSIS — Z1159 Encounter for screening for other viral diseases: Secondary | ICD-10-CM | POA: Diagnosis not present

## 2018-10-27 DIAGNOSIS — D6859 Other primary thrombophilia: Secondary | ICD-10-CM | POA: Insufficient documentation

## 2018-10-27 DIAGNOSIS — J449 Chronic obstructive pulmonary disease, unspecified: Secondary | ICD-10-CM | POA: Insufficient documentation

## 2018-10-27 DIAGNOSIS — G8929 Other chronic pain: Secondary | ICD-10-CM | POA: Insufficient documentation

## 2018-10-27 DIAGNOSIS — Z79899 Other long term (current) drug therapy: Secondary | ICD-10-CM | POA: Insufficient documentation

## 2018-10-27 HISTORY — PX: INCISIONAL HERNIA REPAIR: SHX193

## 2018-10-27 SURGERY — REPAIR, HERNIA, INCISIONAL
Anesthesia: General | Site: Abdomen

## 2018-10-27 MED ORDER — FENTANYL CITRATE (PF) 100 MCG/2ML IJ SOLN
INTRAMUSCULAR | Status: AC
  Filled 2018-10-27: qty 2

## 2018-10-27 MED ORDER — KETOROLAC TROMETHAMINE 30 MG/ML IJ SOLN
30.0000 mg | Freq: Once | INTRAMUSCULAR | Status: AC
  Administered 2018-10-27: 30 mg via INTRAVENOUS
  Filled 2018-10-27: qty 1

## 2018-10-27 MED ORDER — CEFAZOLIN SODIUM-DEXTROSE 2-4 GM/100ML-% IV SOLN
2.0000 g | INTRAVENOUS | Status: AC
  Administered 2018-10-27: 2 g via INTRAVENOUS
  Filled 2018-10-27: qty 100

## 2018-10-27 MED ORDER — CHLORHEXIDINE GLUCONATE CLOTH 2 % EX PADS: 6.0000 | MEDICATED_PAD | Freq: Once | CUTANEOUS | Status: DC

## 2018-10-27 MED ORDER — DEXAMETHASONE SODIUM PHOSPHATE 4 MG/ML IJ SOLN
INTRAMUSCULAR | Status: DC | PRN
  Administered 2018-10-27: 8 mg via INTRAVENOUS

## 2018-10-27 MED ORDER — FENTANYL CITRATE (PF) 100 MCG/2ML IJ SOLN
INTRAMUSCULAR | Status: DC | PRN
  Administered 2018-10-27 (×2): 50 ug via INTRAVENOUS
  Administered 2018-10-27 (×2): 25 ug via INTRAVENOUS
  Administered 2018-10-27 (×4): 50 ug via INTRAVENOUS

## 2018-10-27 MED ORDER — FENTANYL CITRATE (PF) 250 MCG/5ML IJ SOLN
INTRAMUSCULAR | Status: AC
  Filled 2018-10-27: qty 5

## 2018-10-27 MED ORDER — BUPIVACAINE LIPOSOME 1.3 % IJ SUSP
INTRAMUSCULAR | Status: DC | PRN
  Administered 2018-10-27: 20 mL

## 2018-10-27 MED ORDER — LACTATED RINGERS IV SOLN
INTRAVENOUS | Status: DC
  Administered 2018-10-27: 09:00:00 via INTRAVENOUS

## 2018-10-27 MED ORDER — ONDANSETRON HCL 4 MG/2ML IJ SOLN
INTRAMUSCULAR | Status: DC | PRN
  Administered 2018-10-27: 4 mg via INTRAVENOUS

## 2018-10-27 MED ORDER — GLYCOPYRROLATE PF 0.2 MG/ML IJ SOSY
PREFILLED_SYRINGE | INTRAMUSCULAR | Status: DC | PRN
  Administered 2018-10-27: .2 mg via INTRAVENOUS

## 2018-10-27 MED ORDER — PROPOFOL 10 MG/ML IV BOLUS
INTRAVENOUS | Status: DC | PRN
  Administered 2018-10-27: 230 mg via INTRAVENOUS
  Administered 2018-10-27 (×2): 40 mg via INTRAVENOUS

## 2018-10-27 MED ORDER — KETOROLAC TROMETHAMINE 10 MG PO TABS: 10.0000 mg | ORAL_TABLET | Freq: Four times a day (QID) | ORAL | 0 refills | Status: DC | PRN

## 2018-10-27 MED ORDER — MIDAZOLAM HCL 5 MG/5ML IJ SOLN
INTRAMUSCULAR | Status: DC | PRN
  Administered 2018-10-27 (×2): 1 mg via INTRAVENOUS

## 2018-10-27 MED ORDER — LIDOCAINE 2% (20 MG/ML) 5 ML SYRINGE
INTRAMUSCULAR | Status: DC | PRN
  Administered 2018-10-27: 40 mg via INTRAVENOUS

## 2018-10-27 MED ORDER — PROPOFOL 10 MG/ML IV BOLUS
INTRAVENOUS | Status: AC
  Filled 2018-10-27: qty 40

## 2018-10-27 MED ORDER — DEXAMETHASONE SODIUM PHOSPHATE 10 MG/ML IJ SOLN
INTRAMUSCULAR | Status: AC
  Filled 2018-10-27: qty 1

## 2018-10-27 MED ORDER — BUPIVACAINE LIPOSOME 1.3 % IJ SUSP
INTRAMUSCULAR | Status: AC
  Filled 2018-10-27: qty 20

## 2018-10-27 MED ORDER — MIDAZOLAM HCL 2 MG/2ML IJ SOLN
INTRAMUSCULAR | Status: AC
  Filled 2018-10-27: qty 2

## 2018-10-27 MED ORDER — GABAPENTIN 300 MG PO CAPS: 300.0000 mg | ORAL_CAPSULE | Freq: Three times a day (TID) | ORAL | 0 refills | Status: DC

## 2018-10-27 MED ORDER — EPHEDRINE 5 MG/ML INJ
INTRAVENOUS | Status: AC
  Filled 2018-10-27: qty 20

## 2018-10-27 MED ORDER — 0.9 % SODIUM CHLORIDE (POUR BTL) OPTIME
TOPICAL | Status: DC | PRN
  Administered 2018-10-27: 1000 mL

## 2018-10-27 MED ORDER — SEVOFLURANE IN SOLN
RESPIRATORY_TRACT | Status: AC
  Filled 2018-10-27: qty 250

## 2018-10-27 MED ORDER — HYDROMORPHONE HCL 1 MG/ML IJ SOLN: 0.2500 mg | INTRAMUSCULAR | Status: DC | PRN

## 2018-10-27 MED ORDER — ARTIFICIAL TEARS OPHTHALMIC OINT
TOPICAL_OINTMENT | OPHTHALMIC | Status: AC
  Filled 2018-10-27: qty 3.5

## 2018-10-27 MED ORDER — KETOROLAC TROMETHAMINE 30 MG/ML IJ SOLN
INTRAMUSCULAR | Status: AC
  Filled 2018-10-27: qty 1

## 2018-10-27 MED ORDER — ONDANSETRON HCL 4 MG/2ML IJ SOLN
INTRAMUSCULAR | Status: AC
  Filled 2018-10-27: qty 4

## 2018-10-27 SURGICAL SUPPLY — 36 items
BLADE SURG SZ11 CARB STEEL (BLADE) ×3 IMPLANT
CHLORAPREP W/TINT 26 (MISCELLANEOUS) ×3 IMPLANT
CLOTH BEACON ORANGE TIMEOUT ST (SAFETY) ×3 IMPLANT
COVER LIGHT HANDLE STERIS (MISCELLANEOUS) ×6 IMPLANT
DERMABOND ADVANCED (GAUZE/BANDAGES/DRESSINGS) ×2
DERMABOND ADVANCED .7 DNX12 (GAUZE/BANDAGES/DRESSINGS) ×1 IMPLANT
ELECT REM PT RETURN 9FT ADLT (ELECTROSURGICAL) ×3
ELECTRODE REM PT RTRN 9FT ADLT (ELECTROSURGICAL) ×1 IMPLANT
GAUZE SPONGE 4X4 12PLY STRL (GAUZE/BANDAGES/DRESSINGS) ×3 IMPLANT
GLOVE BIO SURGEON STRL SZ7 (GLOVE) ×6 IMPLANT
GLOVE BIOGEL PI IND STRL 7.0 (GLOVE) ×2 IMPLANT
GLOVE BIOGEL PI INDICATOR 7.0 (GLOVE) ×4
GLOVE SURG SS PI 7.5 STRL IVOR (GLOVE) ×3 IMPLANT
GOWN STRL REUS W/TWL LRG LVL3 (GOWN DISPOSABLE) ×9 IMPLANT
INST SET MINOR GENERAL (KITS) ×3 IMPLANT
KIT TURNOVER KIT A (KITS) ×3 IMPLANT
LIGASURE IMPACT 36 18CM CVD LR (INSTRUMENTS) ×3 IMPLANT
MANIFOLD NEPTUNE II (INSTRUMENTS) ×3 IMPLANT
MESH VENTRALEX ST 1-7/10 CRC S (Mesh General) ×3 IMPLANT
MESH VENTRALEX ST 2.5 CRC MED (Mesh General) IMPLANT
NEEDLE HYPO 21X1.5 SAFETY (NEEDLE) ×3 IMPLANT
NS IRRIG 1000ML POUR BTL (IV SOLUTION) ×3 IMPLANT
PACK MINOR (CUSTOM PROCEDURE TRAY) ×3 IMPLANT
PAD ARMBOARD 7.5X6 YLW CONV (MISCELLANEOUS) ×3 IMPLANT
SET BASIN LINEN APH (SET/KITS/TRAYS/PACK) ×3 IMPLANT
STAPLER VISISTAT (STAPLE) IMPLANT
SUT ETHIBOND 0 MO6 C/R (SUTURE) ×3 IMPLANT
SUT SILK 2 0 (SUTURE)
SUT SILK 2-0 18XBRD TIE 12 (SUTURE) IMPLANT
SUT VIC AB 2-0 CT1 27 (SUTURE) ×2
SUT VIC AB 2-0 CT1 TAPERPNT 27 (SUTURE) ×1 IMPLANT
SUT VIC AB 3-0 SH 27 (SUTURE) ×2
SUT VIC AB 3-0 SH 27X BRD (SUTURE) ×1 IMPLANT
SUT VICRYL AB 2 0 TIES (SUTURE) IMPLANT
SYR 20CC LL (SYRINGE) ×3 IMPLANT
SYR 30ML LL (SYRINGE) ×3 IMPLANT

## 2018-10-27 NOTE — Anesthesia Postprocedure Evaluation (Signed)
Anesthesia Post Note  Patient: [name]  Procedure(s) Performed: HERNIA REPAIR INCISIONAL WITH MESH (N/A Abdomen)  Patient location during evaluation: PACU Anesthesia Type: General Level of consciousness: awake and alert and oriented Pain management: pain level controlled Vital Signs Assessment: post-procedure vital signs reviewed and stable Respiratory status: spontaneous breathing Cardiovascular status: stable Postop Assessment: no apparent nausea or vomiting Anesthetic complications: no     Last Vitals:  Vitals:   10/27/18 1056 10/27/18 1120  BP:  126/84  Pulse:  76  Resp:  19  Temp: (!) 36.4 C   SpO2:  91%    Last Pain:  Vitals:   10/27/18 1115  PainSc: 8                  Fenris Cauble A

## 2018-10-27 NOTE — Op Note (Signed)
Patient:  [name]  DOB:  10-14-1960  MRN:  [id number]   Preop Diagnosis: Recurrent incisional hernia  Postop Diagnosis: Same  Procedure: Recurrent incisional herniorrhaphy with mesh  Surgeon: Aviva Signs, MD  Anes: General  Indications: Patient is a 58 year old white male status post incisional herniorrhaphy with mesh at the umbilicus in the remote past who presents with recurrence of the hernia at the umbilicus.  The risks and benefits of the procedure including bleeding, infection, mesh use, and the possibility of recurrence of the hernia were fully explained to the patient, who gave informed consent.  Procedure note: The patient was placed in the supine position.  After general anesthesia was administered, the abdomen was prepped and draped using usual sterile technique with ChloraPrep.  Surgical site confirmation was performed.  An infraumbilical incision was made down to the fascia.  The patient was noted to have a hernia sac with attached omentum to the underside of the umbilicus.  This was freed away and the hernia sac was excised.  The excess omentum was then excised using the LigaSure.  It was then reduced.  The previously placed mesh appeared to be superior to this hernia.  Thus, the patient developed a hernia inferior to the previously placed mesh.  I was able to digitally free away the omentum to the anterior abdominal wall.  A 4.7 cm Bard Ventralax ST patch was then inserted and secured to the fascia using 0 Ethibond interrupted sutures.  The overlying fascia was reapproximated transversely using 0 Ethibond interrupted sutures.  The umbilicus was secured back to the abdominal wall using a 2-0 Vicryl interrupted suture.  The subcutaneous layer was reapproximated using a 3-0 Vicryl interrupted suture.  Exparel was instilled into the surrounding wound.  The skin was closed using a 4-0 Monocryl subcuticular suture.  Dermabond was applied.  All tape and needle counts were correct  at the end of the procedure.  The patient was awakened and transferred to PACU in stable condition.  Complications: None  EBL: Minimal  Specimen: None

## 2018-10-27 NOTE — Discharge Instructions (Signed)
°  Restart your [facility] on Thursday 7/16    Open Hernia Repair, Adult, Care After These instructions give you information about caring for yourself after your procedure. Your doctor may also give you more specific instructions. If you have problems or questions, contact your doctor. Follow these instructions at home: Surgical cut (incision) care   Follow instructions from your doctor about how to take care of your surgical cut area. Make sure you: ? Wash your hands with soap and water before you change your bandage (dressing). If you cannot use soap and water, use hand sanitizer. ? Change your bandage as told by your doctor. ? Leave stitches (sutures), skin glue, or skin tape (adhesive) strips in place. They may need to stay in place for 2 weeks or longer. If tape strips get loose and curl up, you may trim the loose edges. Do not remove tape strips completely unless your doctor says it is okay.  Check your surgical cut every day for signs of infection. Check for: ? More redness, swelling, or pain. ? More fluid or blood. ? Warmth. ? Pus or a bad smell. Activity  Do not drive or use heavy machinery while taking prescription pain medicine. Do not drive until your doctor says it is okay.  Until your doctor says it is okay: ? Do not lift anything that is heavier than 10 lb (4.5 kg). ? Do not play contact sports.  Return to your normal activities as told by your doctor. Ask your doctor what activities are safe. General instructions  To prevent or treat having a hard time pooping (constipation) while you are taking prescription pain medicine, your doctor may recommend that you: ? Drink enough fluid to keep your pee (urine) clear or pale yellow. ? Take over-the-counter or prescription medicines. ? Eat foods that are high in fiber, such as fresh fruits and vegetables, whole grains, and beans. ? Limit foods that are high in fat and processed sugars, such as fried and sweet foods.  Take  over-the-counter and prescription medicines only as told by your doctor.  Do not take baths, swim, or use a hot tub until your doctor says it is okay.  Keep all follow-up visits as told by your doctor. This is important. Contact a doctor if:  You develop a rash.  You have more redness, swelling, or pain around your surgical cut.  You have more fluid or blood coming from your surgical cut.  Your surgical cut feels warm to the touch.  You have pus or a bad smell coming from your surgical cut.  You have a fever or chills.  You have blood in your poop (stool).  You have not pooped in 2-3 days.  Medicine does not help your pain. Get help right away if:  You have chest pain or you are short of breath.  You feel light-headed.  You feel weak and dizzy (feel faint).  You have very bad pain.  You throw up (vomit) and your pain is worse. This information is not intended to replace advice given to you by your health care provider. Make sure you discuss any questions you have with your health care provider. Document Released: 04/21/2014 Document Revised: 07/23/2018 Document Reviewed: 09/12/2015 Elsevier Patient Education  2020 [employer].

## 2018-10-27 NOTE — Interval H&P Note (Signed)
History and Physical Interval Note:  10/27/2018 9:18 AM  Chapman Fitch  has presented today for surgery, with the diagnosis of incisional hernia.  The various methods of treatment have been discussed with the patient and family. After consideration of risks, benefits and other options for treatment, the patient has consented to  Procedure(s): HERNIA REPAIR INCISIONAL WITH MESH (N/A) as a surgical intervention.  The patient's history has been reviewed, patient examined, no change in status, stable for surgery.  I have reviewed the patient's chart and labs.  Questions were answered to the patient's satisfaction.     Aviva Signs

## 2018-10-27 NOTE — Anesthesia Procedure Notes (Signed)
Procedure Name: LMA Insertion Date/Time: 10/27/2018 10:02 AM Performed by: Adams, Amy A, CRNA Pre-anesthesia Checklist: Patient identified, Emergency Drugs available, Suction available, Patient being monitored and Timeout performed Patient Re-evaluated:Patient Re-evaluated prior to induction Oxygen Delivery Method: Circle system utilized Preoxygenation: Pre-oxygenation with 100% oxygen Induction Type: IV induction LMA: LMA inserted LMA Size: 5.0 Number of attempts: 1 Placement Confirmation: positive ETCO2 and breath sounds checked- equal and bilateral Tube secured with: Tape Dental Injury: Teeth and Oropharynx as per pre-operative assessment

## 2018-10-27 NOTE — Transfer of Care (Signed)
Immediate Anesthesia Transfer of Care Note  Patient: LYDELL MOGA  Procedure(s) Performed: HERNIA REPAIR INCISIONAL WITH MESH (N/A Abdomen)  Patient Location: PACU  Anesthesia Type:General  Level of Consciousness: awake, alert , oriented and patient cooperative  Airway & Oxygen Therapy: Patient Spontanous Breathing  Post-op Assessment: Report given to RN and Post -op Vital signs reviewed and stable  Post vital signs: Reviewed and stable  Last Vitals:  Vitals Value Taken Time  BP 137/90 10/27/18 1056  Temp    Pulse 78 10/27/18 1100  Resp 16 10/27/18 1100  SpO2 92 % 10/27/18 1100  Vitals shown include unvalidated device data.  Last Pain:  Vitals:   10/27/18 0850  PainSc: 8          Complications: No apparent anesthesia complications

## 2018-10-27 NOTE — Anesthesia Preprocedure Evaluation (Signed)
Anesthesia Evaluation  Patient identified by MRN, date of birth, ID band Patient awake    Reviewed: Allergy & Precautions, H&P , NPO status , Patient's Chart, lab work & pertinent test results, reviewed documented beta blocker date and time   Airway Mallampati: II  TM Distance: >3 FB Neck ROM: full    Dental  (+) Poor Dentition, Caps   Pulmonary asthma , pneumonia, resolved, COPD,  COPD inhaler, Current Smoker,    Pulmonary exam normal        Cardiovascular Normal cardiovascular exam     Neuro/Psych PSYCHIATRIC DISORDERS Anxiety Bipolar Disorder negative neurological ROS     GI/Hepatic hiatal hernia, GERD  Medicated,(+) Hepatitis -, B, C  Endo/Other  negative endocrine ROS  Renal/GU negative Renal ROS  negative genitourinary   Musculoskeletal   Abdominal   Peds  Hematology   Anesthesia Other Findings Factor S deficiency requiring chronic anticoagulation.  Reproductive/Obstetrics                             Anesthesia Physical Anesthesia Plan  ASA: III  Anesthesia Plan: General   Post-op Pain Management:    Induction:   PONV Risk Score and Plan: 1 and Ondansetron  Airway Management Planned:   Additional Equipment:   Intra-op Plan:   Post-operative Plan:   Informed Consent: I have reviewed the patients History and Physical, chart, labs and discussed the procedure including the risks, benefits and alternatives for the proposed anesthesia with the patient or authorized representative who has indicated his/her understanding and acceptance.       Plan Discussed with: CRNA  Anesthesia Plan Comments:         Anesthesia Quick Evaluation

## 2018-10-28 ENCOUNTER — Encounter (HOSPITAL_COMMUNITY): Payer: Self-pay | Admitting: General Surgery

## 2018-11-04 ENCOUNTER — Telehealth (INDEPENDENT_AMBULATORY_CARE_PROVIDER_SITE_OTHER): Payer: Medicare Other | Admitting: General Surgery

## 2018-11-04 DIAGNOSIS — Z09 Encounter for follow-up examination after completed treatment for conditions other than malignant neoplasm: Secondary | ICD-10-CM

## 2018-11-04 NOTE — Telephone Encounter (Signed)
Virtual postoperative visit performed.  Patient states he is doing well.  His incisional pain has decreased significantly.  He is increasing his activity as able.  He was instructed to follow-up with me as needed.

## 2018-11-09 ENCOUNTER — Other Ambulatory Visit: Payer: Self-pay

## 2018-11-09 ENCOUNTER — Ambulatory Visit (INDEPENDENT_AMBULATORY_CARE_PROVIDER_SITE_OTHER): Payer: Self-pay | Admitting: General Surgery

## 2018-11-09 ENCOUNTER — Encounter: Payer: Self-pay | Admitting: General Surgery

## 2018-11-09 VITALS — BP 111/74 | HR 72 | Temp 96.4°F | Resp 16 | Ht 76.0 in | Wt 240.0 lb

## 2018-11-09 DIAGNOSIS — Z09 Encounter for follow-up examination after completed treatment for conditions other than malignant neoplasm: Secondary | ICD-10-CM

## 2018-11-09 NOTE — Progress Notes (Signed)
Subjective:     [name]  Patient returned for evaluation of his incision.  He states he noticed some swelling along the left side of the umbilicus.  It is nonpainful.  No drainage has been noted from the wound. Objective:    BP 111/74 (BP Location: Left Arm, Patient Position: Sitting, Cuff Size: Normal)   Pulse 72   Temp (!) 96.4 F (35.8 C) (Tympanic)   Resp 16   Ht 6\' 4"  (1.93 m)   Wt 240 lb (108.9 kg)   SpO2 94%   BMI 29.21 kg/m   General:  alert, cooperative and no distress  Incision has healed well at the umbilicus.  I think he had a small seroma that was present, though I could not reproduce any hernia with coughing.     Assessment:    Doing well postoperatively.    Plan:   Given his multiple surgeries in this region, I told him to keep his back straight and use his thighs when he is lifting things.  He should not do any significant weight lifting and should go more for repetition then power.  He understands this and agrees.  Follow-up as needed.

## 2019-01-11 ENCOUNTER — Ambulatory Visit: Payer: Medicare Other | Admitting: Cardiology

## 2019-01-11 NOTE — Progress Notes (Deleted)
Cardiology Office Note  Date: 01/11/2019   ID: Colen, Eltzroth [birth year], MRN [id number]  PCP:  Jani Gravel, MD  Cardiologist: Satira Sark, MD Electrophysiologist:  None   No chief complaint on file.   History of Present Illness: [name] is a 58 y.o. male not seen in the office since 2017.  He has a history of hypercoagulable state with protein S deficiency and previous history of pulmonary embolus. He is on Xarelto and follows with Dr. Maudie Mercury.  Past Medical History:  Diagnosis Date   Anxiety    Arthritis    Asthma    Bipolar disorder ([place])    Chronic pain    COPD (chronic obstructive pulmonary disease) (HCC)    Degenerative disk disease    GERD (gastroesophageal reflux disease)    Hiatal hernia    History of pneumonia    PE (pulmonary embolism) 2012   Protein S deficiency [facility])     Past Surgical History:  Procedure Laterality Date   CHOLECYSTECTOMY     CLOSED REDUCTION MANDIBLE WITH MANDIBULOMA  10/21/2011   Procedure: CLOSED REDUCTION MANDIBLE WITH MANDIBULOMAXILLARY FUSION;  Surgeon: Ascencion Dike, MD;  Location: [place];  Service: ENT;  Laterality: N/A;  MMF screws   HAND SURGERY Right 20 years ago   [place] N/A 10/27/2018   Procedure: HERNIA REPAIR INCISIONAL WITH MESH;  Surgeon: Aviva Signs, MD;  Location: [facility];  Service: General;  Laterality: N/A;   LEG SURGERY     MANDIBULAR HARDWARE REMOVAL  11/11/2011   Procedure: MANDIBULAR HARDWARE REMOVAL;  Surgeon: Ascencion Dike, MD;  Location: [place];  Service: ENT;  Laterality: N/A;   ORIF MANDIBULAR FRACTURE  10/21/2011   Procedure: OPEN REDUCTION INTERNAL FIXATION (ORIF) MANDIBULAR FRACTURE;  Surgeon: Ascencion Dike, MD;  Location: [facility];  Service: ENT;  Laterality: N/A;   UMBILICAL HERNIA REPAIR      Current Outpatient Medications  Medication Sig Dispense Refill   albuterol (PROVENTIL HFA;VENTOLIN HFA) 108 (90 BASE) MCG/ACT inhaler Inhale 2 puffs into the  lungs every 4 (four) hours as needed. FOR SHORTNESS OF BREATH     ALPRAZolam (XANAX) 1 MG tablet Take 1 mg by mouth 2 (two) times daily as needed for anxiety.      citalopram (CELEXA) 40 MG tablet Take 40 mg by mouth daily.     Coenzyme Q10 10 MG capsule Take 1 capsule by mouth daily.     gabapentin (NEURONTIN) 300 MG capsule Take 1 capsule (300 mg total) by mouth 3 (three) times daily. 90 capsule 0   ketorolac (TORADOL) 10 MG tablet Take 1 tablet (10 mg total) by mouth every 6 (six) hours as needed. 30 tablet 0   Milk Thistle 500 MG CAPS Take 500 mg by mouth daily.      omeprazole (PRILOSEC) 40 MG capsule Take 40 mg by mouth daily.     QUEtiapine (SEROQUEL) 300 MG tablet Take 1 tablet by mouth at bedtime.      Rivaroxaban (XARELTO) 20 MG TABS Take 20 mg by mouth daily.     venlafaxine XR (EFFEXOR-XR) 150 MG 24 hr capsule Take 150 mg by mouth daily with breakfast.     No current facility-administered medications for this visit.    Allergies:  Patient has no known allergies.   Social History: The patient  reports that he has been smoking cigarettes. He has a 8.75 pack-year smoking history. He has never used smokeless tobacco.  He reports that he does not drink alcohol or use drugs.   Family History: The patient's family history includes Coronary artery disease in his father and mother; Diabetes in an other family member.   ROS:  Please see the history of present illness. Otherwise, complete review of systems is positive for {NONE DEFAULTED:18576::"none"}.  All other systems are reviewed and negative.   Physical Exam: VS:  There were no vitals taken for this visit., BMI There is no height or weight on file to calculate BMI.  Wt Readings from Last 3 Encounters:  11/09/18 240 lb (108.9 kg)  10/27/18 230 lb (104.3 kg)  10/25/18 230 lb (104.3 kg)    General: Patient appears comfortable at rest. HEENT: Conjunctiva and lids normal, oropharynx clear with moist mucosa. Neck: Supple, no  elevated JVP or carotid bruits, no thyromegaly. Lungs: Clear to auscultation, nonlabored breathing at rest. Cardiac: Regular rate and rhythm, no S3 or significant systolic murmur, no pericardial rub. Abdomen: Soft, nontender, no hepatomegaly, bowel sounds present, no guarding or rebound. Extremities: No pitting edema, distal pulses 2+. Skin: Warm and dry. Musculoskeletal: No kyphosis. Neuropsychiatric: Alert and oriented x3, affect grossly appropriate.  ECG:  An ECG dated 03/02/2018 was personally reviewed today and demonstrated:  Sinus bradycardia.  Recent Labwork: 03/02/2018: ALT 18; AST 18 10/25/2018: BUN 17; Creatinine, Ser 0.75; Hemoglobin 14.9; Platelets 264; Potassium 4.1; Sodium 137   Other Studies Reviewed Today:  Echocardiogram 08/21/2015: Study Conclusions  - Left ventricle: The cavity size was mildly dilated. Wall   thickness was normal. Systolic function was normal. The estimated   ejection fraction was in the range of 55% to 60%. Wall motion was   normal; there were no regional wall motion abnormalities. Left   ventricular diastolic function parameters were normal. - Aortic valve: Valve area (VTI): 3.31 cm^2. Valve area (Vmax):   3.76 cm^2. - Left atrium: The atrium was moderately dilated. - Right atrium: The atrium was mildly dilated. - Technically adequate study.  Assessment and Plan:   Medication Adjustments/Labs and Tests Ordered: Current medicines are reviewed at length with the patient today.  Concerns regarding medicines are outlined above.   Tests Ordered: No orders of the defined types were placed in this encounter.   Medication Changes: No orders of the defined types were placed in this encounter.   Disposition:  Follow up {follow up:15908}  Signed, Jonelle Sidle, MD, [facility] 01/11/2019 2:51 PM    [facility] [address], [place], [place] [address] Phone: [phone]; Fax: [phone]

## 2019-01-31 ENCOUNTER — Ambulatory Visit: Payer: Medicare Other | Admitting: Cardiology

## 2019-02-02 ENCOUNTER — Other Ambulatory Visit (HOSPITAL_COMMUNITY): Payer: Self-pay | Admitting: Adult Health

## 2019-02-02 ENCOUNTER — Other Ambulatory Visit: Payer: Self-pay

## 2019-02-02 ENCOUNTER — Ambulatory Visit (HOSPITAL_COMMUNITY)
Admission: RE | Admit: 2019-02-02 | Discharge: 2019-02-02 | Disposition: A | Discharge status: Home / Self Care | Payer: Medicare Other | Source: Ambulatory Visit | Attending: Adult Health | Admitting: Adult Health

## 2019-02-02 ENCOUNTER — Other Ambulatory Visit: Payer: Self-pay | Admitting: Adult Health

## 2019-02-02 DIAGNOSIS — Z86718 Personal history of other venous thrombosis and embolism: Secondary | ICD-10-CM

## 2019-02-02 DIAGNOSIS — Z86711 Personal history of pulmonary embolism: Secondary | ICD-10-CM

## 2019-02-02 DIAGNOSIS — R600 Localized edema: Secondary | ICD-10-CM

## 2019-02-08 ENCOUNTER — Other Ambulatory Visit: Payer: Self-pay

## 2019-02-08 ENCOUNTER — Ambulatory Visit (INDEPENDENT_AMBULATORY_CARE_PROVIDER_SITE_OTHER): Payer: Medicare Other | Admitting: General Surgery

## 2019-02-08 ENCOUNTER — Encounter: Payer: Self-pay | Admitting: General Surgery

## 2019-02-08 VITALS — BP 129/84 | HR 87 | Temp 98.0°F | Resp 18 | Ht 76.0 in | Wt 242.0 lb

## 2019-02-08 DIAGNOSIS — K432 Incisional hernia without obstruction or gangrene: Secondary | ICD-10-CM

## 2019-02-09 NOTE — H&P (Signed)
[name] is an 58 y.o. male.   Chief Complaint: Recurrent incisional hernia HPI: Patient is a 58 year old white male status post multiple periumbilical hernia repairs in the past who presents with a new swelling to the left of the umbilicus.  This appears to be an additional hernia.  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  He currently has 0 out of 10 abdominal pain.  Past Medical History:  Diagnosis Date  . Anxiety   . Arthritis   . Asthma   . Bipolar disorder (HCC)   . Chronic pain   . COPD (chronic obstructive pulmonary disease) (HCC)   . Degenerative disk disease   . GERD (gastroesophageal reflux disease)   . Hiatal hernia   . History of pneumonia   . PE (pulmonary embolism) 2012  . Protein S deficiency [facility])     Past Surgical History:  Procedure Laterality Date  . CHOLECYSTECTOMY    . CLOSED REDUCTION MANDIBLE WITH MANDIBULOMA  10/21/2011   Procedure: CLOSED REDUCTION MANDIBLE WITH MANDIBULOMAXILLARY FUSION;  Surgeon: Darletta Moll, MD;  Location: [facility] OR;  Service: ENT;  Laterality: N/A;  MMF screws  . HAND SURGERY Right 20 years ago  . INCISIONAL HERNIA REPAIR N/A 10/27/2018   Procedure: HERNIA REPAIR INCISIONAL WITH MESH;  Surgeon: Franky Macho, MD;  Location: [facility];  Service: General;  Laterality: N/A;  . LEG SURGERY    . MANDIBULAR HARDWARE REMOVAL  11/11/2011   Procedure: MANDIBULAR HARDWARE REMOVAL;  Surgeon: Darletta Moll, MD;  Location: [facility];  Service: ENT;  Laterality: N/A;  . ORIF MANDIBULAR FRACTURE  10/21/2011   Procedure: OPEN REDUCTION INTERNAL FIXATION (ORIF) MANDIBULAR FRACTURE;  Surgeon: Darletta Moll, MD;  Location: [facility];  Service: ENT;  Laterality: N/A;  . UMBILICAL HERNIA REPAIR      Family History  Problem Relation Age of Onset  . Coronary artery disease Mother   . Coronary artery disease Father   . Diabetes Other    Social History:  reports that he has been smoking cigarettes. He has a 8.75 pack-year  smoking history. He has never used smokeless tobacco. He reports that he does not drink alcohol or use drugs.  Allergies: No Known Allergies  No medications prior to admission.    No results found for this or any previous visit (from the past 48 hour(s)). No results found.  Review of Systems  Constitutional: Negative.   HENT: Negative.   Eyes: Negative.   Respiratory: Negative.   Cardiovascular: Negative.   Gastrointestinal: Negative.   Genitourinary: Negative.   Musculoskeletal: Negative.   Skin: Negative.   Neurological: Negative.   Endo/Heme/Allergies: Bruises/bleeds easily.  Psychiatric/Behavioral: Negative.     There were no vitals taken for this visit. Physical Exam  Vitals reviewed. Constitutional: He is oriented to person, place, and time. He appears well-developed and well-nourished. No distress.  HENT:  Head: Normocephalic and atraumatic.  Cardiovascular: Normal rate, regular rhythm and normal heart sounds. Exam reveals no gallop and no friction rub.  No murmur heard. Respiratory: Effort normal and breath sounds normal. No respiratory distress. He has no wheezes. He has no rales.  GI: Soft. Normal appearance and bowel sounds are normal. He exhibits no distension and no mass. There is no abdominal tenderness. There is no rebound and no guarding.  Mushroom swelling to the left of the umbilicus which is made worse with straining.    Neurological: He is alert and oriented  to person, place, and time.  Skin: Skin is warm and dry.     Assessment/Plan Impression: Recurrent incisional hernia Plan: Patient is scheduled for laparoscopic recurrent incisional herniorrhaphy with mesh on 02/23/2019.  The risks and benefits of the procedure including bleeding, infection, mesh use, bowel injury, and the possible recurrence of the hernia were fully explained to the patient, who gave informed consent.  Aviva Signs, MD 02/09/2019, 11:02 AM

## 2019-02-09 NOTE — Progress Notes (Signed)
Subjective:     [name]  Patient presents with a new swelling to the left and lateral of the previous umbilical incision site.  He states he has been lifting significantly heavy material and started noticing it recently.  The swelling is made worse with straining.  No nausea or vomiting have been noted.  He describes being sore on occasion. Objective:    BP 129/84 (BP Location: Left Arm, Patient Position: Sitting, Cuff Size: Normal)   Pulse 87   Temp 98 F (36.7 C) (Oral)   Resp 18   Ht 6\' 4"  (1.93 m)   Wt 242 lb (109.8 kg)   SpO2 95%   BMI 29.46 kg/m   General:  alert, cooperative and no distress  Abdomen is soft, previous surgical incision site healed.  Patient does have a swelling to the left of the umbilicus.  A hernia defect is palpable.     Assessment:    Recurrent incisional hernia.  This appears to be lateral to the previous repair.    Plan:  Patient is scheduled for a laparoscopic incisional herniorrhaphy with mesh on 02/23/2019.  The risks and benefits of the procedure including bleeding, infection, mesh use, possible recurrence of the hernia were fully explained to the patient, who gave informed consent.

## 2019-02-18 NOTE — Patient Instructions (Signed)
[name]  02/18/2019     @   Your procedure is scheduled on  02/23/2019   Report to [facility] at  [phone]  A.M.  Call this number if you have problems the morning of surgery:  [phone]   Remember:  Do not eat or drink after midnight.                         Take these medicines the morning of surgery with A SIP OF WATER  Xanax(If needed), adderall, gabapentin, antivert(if needed), prilosec, phenergan(if needed), effexor. Use your inhalers before you come.    Do not wear jewelry, make-up or nail polish.  Do not wear lotions, powders, or perfumes. Please wear deodorant and brush your teeth.  Do not shave 48 hours prior to surgery.  Men may shave face and neck.  Do not bring valuables to the hospital.  [facility] is not responsible for any belongings or valuables.  Contacts, dentures or bridgework may not be worn into surgery.  Leave your suitcase in the car.  After surgery it may be brought to your room.  For patients admitted to the hospital, discharge time will be determined by your treatment team.  Patients discharged the day of surgery will not be allowed to drive home.   Name and phone number of your driver:   family Special instructions:  None  Please read over the following fact sheets that you were given. Anesthesia Post-op Instructions and Care and Recovery After Surgery       Laparoscopic Inguinal Hernia Repair, Adult, Care After This sheet gives you information about how to care for yourself after your procedure. Your health care provider may also give you more specific instructions. If you have problems or questions, contact your health care provider. What can I expect after the procedure? After the procedure, it is common to have:  Pain.  Swelling and bruising around the incision area.  Scrotal swelling, in men.  Some fluid or blood draining from your incisions. Follow these instructions at home: Incision  care  Follow instructions from your health care provider about how to take care of your incisions. Make sure you: ? Wash your hands with soap and water before you change your bandage (dressing). If soap and water are not available, use hand sanitizer. ? Change your dressing as told by your health care provider. ? Leave stitches (sutures), skin glue, or adhesive strips in place. These skin closures may need to stay in place for 2 weeks or longer. If adhesive strip edges start to loosen and curl up, you may trim the loose edges. Do not remove adhesive strips completely unless your health care provider tells you to do that.  Check your incision area every day for signs of infection. Check for: ? More redness, swelling, or pain. ? More fluid or blood. ? Warmth. ? Pus or a bad smell.  Wear loose, soft clothing while your incisions heal. Driving  Do not drive or use heavy machinery while taking prescription pain medicine.  Do not drive for 24 hours if you were given a medicine to help you relax (sedative) during your procedure. Activity  Do not lift anything that is heavier than 10 lb (4.5 kg), or the limit that you are told, until your health care provider says that it is safe.  Ask your health care provider what activities are safe for you.A lot of  activity during the first week after surgery can increase pain and swelling. For 1 week after your procedure: ? Avoid activities that take a lot of effort, such as exercise or sports. ? You may walk and climb stairs as needed for daily activity, but avoid long walks or climbing stairs for exercise. Managing pain and swelling   Put ice on painful or swollen areas: ? Put ice in a plastic bag. ? Place a towel between your skin and the bag. ? Leave the ice on for 20 minutes, 2-3 times a day. General instructions  Do not take baths, swim, or use a hot tub until your health care provider approves. Ask your health care provider if you may take  showers. You may only be allowed to take sponge baths.  Take over-the-counter and prescription medicines only as told by your health care provider.  To prevent or treat constipation while you are taking prescription pain medicine, your health care provider may recommend that you: ? Drink enough fluid to keep your urine pale yellow. ? Take over-the-counter or prescription medicines. ? Eat foods that are high in fiber, such as fresh fruits and vegetables, whole grains, and beans. ? Limit foods that are high in fat and processed sugars, such as fried and sweet foods.  Do not use any products that contain nicotine or tobacco, such as cigarettes and e-cigarettes. If you need help quitting, ask your health care provider.  Drink enough fluid to keep your urine pale yellow.  Keep all follow-up visits as told by your health care provider. This is important. Contact a health care provider if:  You have more redness, swelling, or pain around your incisions or your groin area.  You have more swelling in your scrotum.  You have more fluid or blood coming from your incisions.  Your incisions feel warm to the touch.  You have severe pain and medicines do not help.  You have abdominal pain or swelling.  You cannot eat or drink without vomiting.  You cannot urinate or pass a bowel movement.  You faint.  You feel dizzy.  You have nausea and vomiting.  You have a fever. Get help right away if:  You have pus or a bad smell coming from your incisions.  You have chest pain.  You have problems breathing. Summary  Pain, swelling, and bruising are common after the procedure.  Check your incision area every day for signs of infection, such as more redness, swelling, or pain.  Put ice on painful or swollen areas for 20 minutes, 2-3 times a day. This information is not intended to replace advice given to you by your health care provider. Make sure you discuss any questions you have with  your health care provider. Document Released: 07/10/2016 Document Revised: 04/03/2017 Document Reviewed: 07/10/2016 Elsevier Patient Education  2020 [employer]  General Anesthesia, Adult, Care After This sheet gives you information about how to care for yourself after your procedure. Your health care provider may also give you more specific instructions. If you have problems or questions, contact your health care provider. What can I expect after the procedure? After the procedure, the following side effects are common:  Pain or discomfort at the IV site.  Nausea.  Vomiting.  Sore throat.  Trouble concentrating.  Feeling cold or chills.  Weak or tired.  Sleepiness and fatigue.  Soreness and body aches. These side effects can affect parts of the body that were not involved in surgery. Follow these instructions  at home:  For at least 24 hours after the procedure:  Have a responsible adult stay with you. It is important to have someone help care for you until you are awake and alert.  Rest as needed.  Do not: ? Participate in activities in which you could fall or become injured. ? Drive. ? Use heavy machinery. ? Drink alcohol. ? Take sleeping pills or medicines that cause drowsiness. ? Make important decisions or sign legal documents. ? Take care of children on your own. Eating and drinking  Follow any instructions from your health care provider about eating or drinking restrictions.  When you feel hungry, start by eating small amounts of foods that are soft and easy to digest (bland), such as toast. Gradually return to your regular diet.  Drink enough fluid to keep your urine pale yellow.  If you vomit, rehydrate by drinking water, juice, or clear broth. General instructions  If you have sleep apnea, surgery and certain medicines can increase your risk for breathing problems. Follow instructions from your health care provider about wearing your sleep  device: ? Anytime you are sleeping, including during daytime naps. ? While taking prescription pain medicines, sleeping medicines, or medicines that make you drowsy.  Return to your normal activities as told by your health care provider. Ask your health care provider what activities are safe for you.  Take over-the-counter and prescription medicines only as told by your health care provider.  If you smoke, do not smoke without supervision.  Keep all follow-up visits as told by your health care provider. This is important. Contact a health care provider if:  You have nausea or vomiting that does not get better with medicine.  You cannot eat or drink without vomiting.  You have pain that does not get better with medicine.  You are unable to pass urine.  You develop a skin rash.  You have a fever.  You have redness around your IV site that gets worse. Get help right away if:  You have difficulty breathing.  You have chest pain.  You have blood in your urine or stool, or you vomit blood. Summary  After the procedure, it is common to have a sore throat or nausea. It is also common to feel tired.  Have a responsible adult stay with you for the first 24 hours after general anesthesia. It is important to have someone help care for you until you are awake and alert.  When you feel hungry, start by eating small amounts of foods that are soft and easy to digest (bland), such as toast. Gradually return to your regular diet.  Drink enough fluid to keep your urine pale yellow.  Return to your normal activities as told by your health care provider. Ask your health care provider what activities are safe for you. This information is not intended to replace advice given to you by your health care provider. Make sure you discuss any questions you have with your health care provider. Document Released: 07/07/2000 Document Revised: 04/03/2017 Document Reviewed: 11/14/2016 Elsevier Patient  Education  2020 [employer]. How to Use Chlorhexidine for Bathing Chlorhexidine gluconate (CHG) is a germ-killing (antiseptic) solution that is used to clean the skin. It can get rid of the bacteria that normally live on the skin and can keep them away for about 24 hours. To clean your skin with CHG, you may be given:  A CHG solution to use in the shower or as part of a sponge bath.  A prepackaged cloth that contains CHG. Cleaning your skin with CHG may help lower the risk for infection:  While you are staying in the intensive care unit of the hospital.  If you have a vascular access, such as a central line, to provide short-term or long-term access to your veins.  If you have a catheter to drain urine from your bladder.  If you are on a ventilator. A ventilator is a machine that helps you breathe by moving air in and out of your lungs.  After surgery. What are the risks? Risks of using CHG include:  A skin reaction.  Hearing loss, if CHG gets in your ears.  Eye injury, if CHG gets in your eyes and is not rinsed out.  The CHG product catching fire. Make sure that you avoid smoking and flames after applying CHG to your skin. Do not use CHG:  If you have a chlorhexidine allergy or have previously reacted to chlorhexidine.  On babies younger than 76 months of age. How to use CHG solution  Use CHG only as told by your health care provider, and follow the instructions on the label.  Use the full amount of CHG as directed. Usually, this is one bottle. During a shower Follow these steps when using CHG solution during a shower (unless your health care provider gives you different instructions): 1. Start the shower. 2. Use your normal soap and shampoo to wash your face and hair. 3. Turn off the shower or move out of the shower stream. 4. Pour the CHG onto a clean washcloth. Do not use any type of brush or rough-edged sponge. 5. Starting at your neck, lather your body down to your  toes. Make sure you follow these instructions: ? If you will be having surgery, pay special attention to the part of your body where you will be having surgery. Scrub this area for at least 1 minute. ? Do not use CHG on your head or face. If the solution gets into your ears or eyes, rinse them well with water. ? Avoid your genital area. ? Avoid any areas of skin that have broken skin, cuts, or scrapes. ? Scrub your back and under your arms. Make sure to wash skin folds. 6. Let the lather sit on your skin for 1-2 minutes or as long as told by your health care provider. 7. Thoroughly rinse your entire body in the shower. Make sure that all body creases and crevices are rinsed well. 8. Dry off with a clean towel. Do not put any substances on your body afterward--such as powder, lotion, or perfume--unless you are told to do so by your health care provider. Only use lotions that are recommended by the manufacturer. 9. Put on clean clothes or pajamas. 10. If it is the night before your surgery, sleep in clean sheets.  During a sponge bath Follow these steps when using CHG solution during a sponge bath (unless your health care provider gives you different instructions): 1. Use your normal soap and shampoo to wash your face and hair. 2. Pour the CHG onto a clean washcloth. 3. Starting at your neck, lather your body down to your toes. Make sure you follow these instructions: ? If you will be having surgery, pay special attention to the part of your body where you will be having surgery. Scrub this area for at least 1 minute. ? Do not use CHG on your head or face. If the solution gets into your ears or eyes,  rinse them well with water. ? Avoid your genital area. ? Avoid any areas of skin that have broken skin, cuts, or scrapes. ? Scrub your back and under your arms. Make sure to wash skin folds. 4. Let the lather sit on your skin for 1-2 minutes or as long as told by your health care provider. 5. Using  a different clean, wet washcloth, thoroughly rinse your entire body. Make sure that all body creases and crevices are rinsed well. 6. Dry off with a clean towel. Do not put any substances on your body afterward--such as powder, lotion, or perfume--unless you are told to do so by your health care provider. Only use lotions that are recommended by the manufacturer. 7. Put on clean clothes or pajamas. 8. If it is the night before your surgery, sleep in clean sheets. How to use CHG prepackaged cloths  Only use CHG cloths as told by your health care provider, and follow the instructions on the label.  Use the CHG cloth on clean, dry skin.  Do not use the CHG cloth on your head or face unless your health care provider tells you to.  When washing with the CHG cloth: ? Avoid your genital area. ? Avoid any areas of skin that have broken skin, cuts, or scrapes. Before surgery Follow these steps when using a CHG cloth to clean before surgery (unless your health care provider gives you different instructions): 1. Using the CHG cloth, vigorously scrub the part of your body where you will be having surgery. Scrub using a back-and-forth motion for 3 minutes. The area on your body should be completely wet with CHG when you are done scrubbing. 2. Do not rinse. Discard the cloth and let the area air-dry. Do not put any substances on the area afterward, such as powder, lotion, or perfume. 3. Put on clean clothes or pajamas. 4. If it is the night before your surgery, sleep in clean sheets.  For general bathing Follow these steps when using CHG cloths for general bathing (unless your health care provider gives you different instructions). 1. Use a separate CHG cloth for each area of your body. Make sure you wash between any folds of skin and between your fingers and toes. Wash your body in the following order, switching to a new cloth after each step: ? The front of your neck, shoulders, and chest. ? Both of  your arms, under your arms, and your hands. ? Your stomach and groin area, avoiding the genitals. ? Your right leg and foot. ? Your left leg and foot. ? The back of your neck, your back, and your buttocks. 2. Do not rinse. Discard the cloth and let the area air-dry. Do not put any substances on your body afterward--such as powder, lotion, or perfume--unless you are told to do so by your health care provider. Only use lotions that are recommended by the manufacturer. 3. Put on clean clothes or pajamas. Contact a health care provider if:  Your skin gets irritated after scrubbing.  You have questions about using your solution or cloth. Get help right away if:  Your eyes become very red or swollen.  Your eyes itch badly.  Your skin itches badly and is red or swollen.  Your hearing changes.  You have trouble seeing.  You have swelling or tingling in your mouth or throat.  You have trouble breathing.  You swallow any chlorhexidine. Summary  Chlorhexidine gluconate (CHG) is a germ-killing (antiseptic) solution that is used to  clean the skin. Cleaning your skin with CHG may help to lower your risk for infection.  You may be given CHG to use for bathing. It may be in a bottle or in a prepackaged cloth to use on your skin. Carefully follow your health care provider's instructions and the instructions on the product label.  Do not use CHG if you have a chlorhexidine allergy.  Contact your health care provider if your skin gets irritated after scrubbing. This information is not intended to replace advice given to you by your health care provider. Make sure you discuss any questions you have with your health care provider. Document Released: 12/24/2011 Document Revised: 06/17/2018 Document Reviewed: 02/26/2017 Elsevier Patient Education  2020 [employer].

## 2019-02-21 ENCOUNTER — Other Ambulatory Visit: Payer: Self-pay | Admitting: General Surgery

## 2019-02-21 ENCOUNTER — Other Ambulatory Visit (HOSPITAL_COMMUNITY)
Admission: RE | Admit: 2019-02-21 | Discharge: 2019-02-21 | Disposition: A | Discharge status: Home / Self Care | Payer: Medicare Other | Source: Ambulatory Visit | Attending: General Surgery | Admitting: General Surgery

## 2019-02-21 ENCOUNTER — Encounter (HOSPITAL_COMMUNITY): Payer: Self-pay

## 2019-02-21 ENCOUNTER — Other Ambulatory Visit: Payer: Self-pay

## 2019-02-21 ENCOUNTER — Encounter (HOSPITAL_COMMUNITY)
Admission: RE | Admit: 2019-02-21 | Discharge: 2019-02-21 | Disposition: A | Discharge status: Home / Self Care | Payer: Medicare Other | Source: Ambulatory Visit | Attending: General Surgery | Admitting: General Surgery

## 2019-02-21 DIAGNOSIS — Z01818 Encounter for other preprocedural examination: Secondary | ICD-10-CM | POA: Diagnosis not present

## 2019-02-21 DIAGNOSIS — K432 Incisional hernia without obstruction or gangrene: Secondary | ICD-10-CM | POA: Insufficient documentation

## 2019-02-21 LAB — CBC WITH DIFFERENTIAL/PLATELET
Abs Immature Granulocytes: 0.01 10*3/uL (ref 0.00–0.07)
Basophils Absolute: 0.1 10*3/uL (ref 0.0–0.1)
Basophils Relative: 1 %
Eosinophils Absolute: 0.2 10*3/uL (ref 0.0–0.5)
Eosinophils Relative: 4 %
HCT: 41 % (ref 39.0–52.0)
Hemoglobin: 13.2 g/dL (ref 13.0–17.0)
Immature Granulocytes: 0 %
Lymphocytes Relative: 31 %
Lymphs Abs: 1.5 10*3/uL (ref 0.7–4.0)
MCH: 30.5 pg (ref 26.0–34.0)
MCHC: 32.2 g/dL (ref 30.0–36.0)
MCV: 94.7 fL (ref 80.0–100.0)
Monocytes Absolute: 0.4 10*3/uL (ref 0.1–1.0)
Monocytes Relative: 9 %
Neutro Abs: 2.7 10*3/uL (ref 1.7–7.7)
Neutrophils Relative %: 55 %
Platelets: 175 10*3/uL (ref 150–400)
RBC: 4.33 MIL/uL (ref 4.22–5.81)
RDW: 13.3 % (ref 11.5–15.5)
WBC: 4.8 10*3/uL (ref 4.0–10.5)
nRBC: 0 % (ref 0.0–0.2)

## 2019-02-21 LAB — BASIC METABOLIC PANEL
Anion gap: 8 (ref 5–15)
BUN: 16 mg/dL (ref 6–20)
CO2: 25 mmol/L (ref 22–32)
Calcium: 9 mg/dL (ref 8.9–10.3)
Chloride: 105 mmol/L (ref 98–111)
Creatinine, Ser: 0.73 mg/dL (ref 0.61–1.24)
GFR calc Af Amer: >60 mL/min (ref >60)
GFR calc non Af Amer: >60 mL/min (ref >60)
Glucose, Bld: 101 mg/dL — ABNORMAL HIGH (ref 70–99)
Potassium: 4.1 mmol/L (ref 3.5–5.1)
Sodium: 138 mmol/L (ref 135–145)

## 2019-02-21 LAB — SARS CORONAVIRUS 2 (TAT 6-24 HRS): SARS Coronavirus 2: NEGATIVE

## 2019-02-23 ENCOUNTER — Ambulatory Visit (HOSPITAL_COMMUNITY)
Admission: RE | Admit: 2019-02-23 | Discharge: 2019-02-23 | Disposition: A | Discharge status: Home / Self Care | Payer: Medicare Other | Attending: General Surgery | Admitting: General Surgery

## 2019-02-23 ENCOUNTER — Other Ambulatory Visit: Payer: Self-pay

## 2019-02-23 ENCOUNTER — Encounter (HOSPITAL_COMMUNITY)
Admission: RE | Disposition: A | Discharge status: Home / Self Care | Payer: Self-pay | Source: Home / Self Care | Attending: General Surgery

## 2019-02-23 ENCOUNTER — Ambulatory Visit (HOSPITAL_COMMUNITY): Payer: Medicare Other | Admitting: Anesthesiology

## 2019-02-23 ENCOUNTER — Encounter (HOSPITAL_COMMUNITY): Payer: Self-pay | Admitting: *Deleted

## 2019-02-23 DIAGNOSIS — K432 Incisional hernia without obstruction or gangrene: Secondary | ICD-10-CM | POA: Diagnosis present

## 2019-02-23 DIAGNOSIS — J449 Chronic obstructive pulmonary disease, unspecified: Secondary | ICD-10-CM | POA: Diagnosis not present

## 2019-02-23 DIAGNOSIS — F419 Anxiety disorder, unspecified: Secondary | ICD-10-CM | POA: Diagnosis not present

## 2019-02-23 DIAGNOSIS — K219 Gastro-esophageal reflux disease without esophagitis: Secondary | ICD-10-CM | POA: Diagnosis not present

## 2019-02-23 DIAGNOSIS — M1990 Unspecified osteoarthritis, unspecified site: Secondary | ICD-10-CM | POA: Insufficient documentation

## 2019-02-23 DIAGNOSIS — F319 Bipolar disorder, unspecified: Secondary | ICD-10-CM | POA: Insufficient documentation

## 2019-02-23 DIAGNOSIS — Z86711 Personal history of pulmonary embolism: Secondary | ICD-10-CM | POA: Insufficient documentation

## 2019-02-23 DIAGNOSIS — D6859 Other primary thrombophilia: Secondary | ICD-10-CM | POA: Insufficient documentation

## 2019-02-23 DIAGNOSIS — F17210 Nicotine dependence, cigarettes, uncomplicated: Secondary | ICD-10-CM | POA: Insufficient documentation

## 2019-02-23 HISTORY — PX: INCISIONAL HERNIA REPAIR: SHX193

## 2019-02-23 SURGERY — REPAIR, HERNIA, INCISIONAL, LAPAROSCOPIC
Anesthesia: General | Site: Abdomen

## 2019-02-23 MED ORDER — LACTATED RINGERS IV SOLN
INTRAVENOUS | Status: DC
  Administered 2019-02-23 (×2): via INTRAVENOUS

## 2019-02-23 MED ORDER — SUFENTANIL CITRATE 50 MCG/ML IV SOLN
INTRAVENOUS | Status: AC
  Filled 2019-02-23: qty 1

## 2019-02-23 MED ORDER — ROCURONIUM BROMIDE 10 MG/ML (PF) SYRINGE
PREFILLED_SYRINGE | INTRAVENOUS | Status: AC
  Filled 2019-02-23: qty 10

## 2019-02-23 MED ORDER — HYDROMORPHONE HCL 1 MG/ML IJ SOLN
0.2500 mg | INTRAMUSCULAR | Status: DC | PRN
  Administered 2019-02-23 (×2): 0.5 mg via INTRAVENOUS
  Filled 2019-02-23 (×2): qty 0.5

## 2019-02-23 MED ORDER — PROPOFOL 10 MG/ML IV BOLUS
INTRAVENOUS | Status: DC | PRN
  Administered 2019-02-23: 250 mg via INTRAVENOUS

## 2019-02-23 MED ORDER — SODIUM CHLORIDE (PF) 0.9 % IJ SOLN
INTRAMUSCULAR | Status: AC
  Filled 2019-02-23: qty 10

## 2019-02-23 MED ORDER — BUPIVACAINE LIPOSOME 1.3 % IJ SUSP
INTRAMUSCULAR | Status: DC | PRN
  Administered 2019-02-23: 20 mL

## 2019-02-23 MED ORDER — SUCCINYLCHOLINE CHLORIDE 20 MG/ML IJ SOLN
INTRAMUSCULAR | Status: DC | PRN
  Administered 2019-02-23: 140 mg via INTRAVENOUS

## 2019-02-23 MED ORDER — PROMETHAZINE HCL 25 MG/ML IJ SOLN: 6.2500 mg | INTRAMUSCULAR | Status: DC | PRN

## 2019-02-23 MED ORDER — ROCURONIUM BROMIDE 100 MG/10ML IV SOLN
INTRAVENOUS | Status: DC | PRN
  Administered 2019-02-23: 50 mg via INTRAVENOUS

## 2019-02-23 MED ORDER — CEFAZOLIN SODIUM-DEXTROSE 2-4 GM/100ML-% IV SOLN
INTRAVENOUS | Status: AC
  Filled 2019-02-23: qty 100

## 2019-02-23 MED ORDER — SUFENTANIL CITRATE 50 MCG/ML IV SOLN
INTRAVENOUS | Status: DC | PRN
  Administered 2019-02-23 (×5): 10 ug via INTRAVENOUS

## 2019-02-23 MED ORDER — ONDANSETRON HCL 4 MG/2ML IJ SOLN
INTRAMUSCULAR | Status: DC | PRN
  Administered 2019-02-23: 4 mg via INTRAVENOUS

## 2019-02-23 MED ORDER — POVIDONE-IODINE 10 % EX OINT
TOPICAL_OINTMENT | CUTANEOUS | Status: AC
  Filled 2019-02-23: qty 1

## 2019-02-23 MED ORDER — PROPOFOL 10 MG/ML IV BOLUS
INTRAVENOUS | Status: AC
  Filled 2019-02-23: qty 40

## 2019-02-23 MED ORDER — SUGAMMADEX SODIUM 500 MG/5ML IV SOLN
INTRAVENOUS | Status: DC | PRN
  Administered 2019-02-23: 219.4 mg via INTRAVENOUS

## 2019-02-23 MED ORDER — CEFAZOLIN SODIUM-DEXTROSE 2-4 GM/100ML-% IV SOLN
2.0000 g | INTRAVENOUS | Status: AC
  Administered 2019-02-23: 09:00:00 2 g via INTRAVENOUS

## 2019-02-23 MED ORDER — CHLORHEXIDINE GLUCONATE CLOTH 2 % EX PADS: 6.0000 | MEDICATED_PAD | Freq: Once | CUTANEOUS | Status: DC

## 2019-02-23 MED ORDER — KETOROLAC TROMETHAMINE 30 MG/ML IJ SOLN
INTRAMUSCULAR | Status: AC
  Filled 2019-02-23: qty 1

## 2019-02-23 MED ORDER — BUPIVACAINE IN DEXTROSE 0.75-8.25 % IT SOLN
INTRATHECAL | Status: AC
  Filled 2019-02-23: qty 2

## 2019-02-23 MED ORDER — ONDANSETRON HCL 4 MG/2ML IJ SOLN
INTRAMUSCULAR | Status: AC
  Filled 2019-02-23: qty 2

## 2019-02-23 MED ORDER — MIDAZOLAM HCL 2 MG/2ML IJ SOLN: 0.5000 mg | Freq: Once | INTRAMUSCULAR | Status: DC | PRN

## 2019-02-23 MED ORDER — 0.9 % SODIUM CHLORIDE (POUR BTL) OPTIME
TOPICAL | Status: DC | PRN
  Administered 2019-02-23: 1000 mL

## 2019-02-23 MED ORDER — KETOROLAC TROMETHAMINE 30 MG/ML IJ SOLN
30.0000 mg | Freq: Once | INTRAMUSCULAR | Status: AC
  Administered 2019-02-23: 30 mg via INTRAVENOUS
  Filled 2019-02-23: qty 1

## 2019-02-23 MED ORDER — SUCCINYLCHOLINE CHLORIDE 200 MG/10ML IV SOSY
PREFILLED_SYRINGE | INTRAVENOUS | Status: AC
  Filled 2019-02-23: qty 20

## 2019-02-23 MED ORDER — BUPIVACAINE LIPOSOME 1.3 % IJ SUSP
INTRAMUSCULAR | Status: AC
  Filled 2019-02-23: qty 20

## 2019-02-23 MED ORDER — HYDROCODONE-ACETAMINOPHEN 7.5-325 MG PO TABS: 1.0000 | ORAL_TABLET | Freq: Once | ORAL | Status: DC | PRN

## 2019-02-23 SURGICAL SUPPLY — 41 items
BLADE SURG SZ11 CARB STEEL (BLADE) ×3 IMPLANT
CHLORAPREP W/TINT 26 (MISCELLANEOUS) ×3 IMPLANT
CLOTH BEACON ORANGE TIMEOUT ST (SAFETY) ×3 IMPLANT
COVER LIGHT HANDLE STERIS (MISCELLANEOUS) ×6 IMPLANT
COVER WAND RF STERILE (DRAPES) ×3 IMPLANT
DERMABOND ADVANCED (GAUZE/BANDAGES/DRESSINGS) ×2
DERMABOND ADVANCED .7 DNX12 (GAUZE/BANDAGES/DRESSINGS) ×1 IMPLANT
DEVICE TROCAR PUNCTURE CLOSURE (ENDOMECHANICALS) ×3 IMPLANT
ELECT REM PT RETURN 9FT ADLT (ELECTROSURGICAL) ×3
ELECTRODE REM PT RTRN 9FT ADLT (ELECTROSURGICAL) ×1 IMPLANT
GLOVE BIOGEL M 6.5 STRL (GLOVE) ×3 IMPLANT
GLOVE BIOGEL PI IND STRL 7.0 (GLOVE) ×4 IMPLANT
GLOVE BIOGEL PI INDICATOR 7.0 (GLOVE) ×8
GLOVE ECLIPSE 6.5 STRL STRAW (GLOVE) ×3 IMPLANT
GLOVE SURG SS PI 7.5 STRL IVOR (GLOVE) ×3 IMPLANT
GOWN STRL REUS W/TWL LRG LVL3 (GOWN DISPOSABLE) ×9 IMPLANT
INST SET LAPROSCOPIC AP (KITS) ×3 IMPLANT
KIT TURNOVER KIT A (KITS) ×3 IMPLANT
LIGASURE LAP ATLAS 10MM 37CM (INSTRUMENTS) ×3 IMPLANT
MANIFOLD NEPTUNE II (INSTRUMENTS) ×3 IMPLANT
MESH VENTRALIGHT ST 4X6IN (Mesh General) ×3 IMPLANT
NEEDLE HYPO 18GX1.5 BLUNT FILL (NEEDLE) ×3 IMPLANT
NEEDLE INSUFFLATION 14GA 120MM (NEEDLE) ×3 IMPLANT
NS IRRIG 1000ML POUR BTL (IV SOLUTION) ×3 IMPLANT
PACK LAP CHOLE LZT030E (CUSTOM PROCEDURE TRAY) ×3 IMPLANT
PAD ARMBOARD 7.5X6 YLW CONV (MISCELLANEOUS) ×3 IMPLANT
PENCIL HANDSWITCHING (ELECTRODE) ×3 IMPLANT
SET BASIN LINEN APH (SET/KITS/TRAYS/PACK) ×3 IMPLANT
SET TUBE SMOKE EVAC HIGH FLOW (TUBING) ×3 IMPLANT
STAPLER VISISTAT (STAPLE) IMPLANT
SUT MNCRL AB 4-0 PS2 18 (SUTURE) ×9 IMPLANT
SUT PROLENE 0 CT 1 CR/8 (SUTURE) ×3 IMPLANT
SUT VICRYL 0 UR6 27IN ABS (SUTURE) ×3 IMPLANT
SYR 20ML LL LF (SYRINGE) ×3 IMPLANT
SYR 30ML LL (SYRINGE) ×3 IMPLANT
TACKER 5MM HERNIA 3.5CML NAB (ENDOMECHANICALS) ×3 IMPLANT
TRAY FOLEY W/BAG SLVR 16FR (SET/KITS/TRAYS/PACK) ×2
TRAY FOLEY W/BAG SLVR 16FR ST (SET/KITS/TRAYS/PACK) ×1 IMPLANT
TROCAR ENDO BLADELESS 11MM (ENDOMECHANICALS) ×3 IMPLANT
TROCAR XCEL UNIV SLVE 11M 100M (ENDOMECHANICALS) ×9 IMPLANT
WARMER LAPAROSCOPE (MISCELLANEOUS) ×3 IMPLANT

## 2019-02-23 NOTE — Anesthesia Preprocedure Evaluation (Signed)
Anesthesia Evaluation  Patient identified by MRN, date of birth, ID band Patient awake    Reviewed: Allergy & Precautions, NPO status , Patient's Chart, lab work & pertinent test results  Airway Mallampati: II  TM Distance: >3 FB Neck ROM: Full    Dental no notable dental hx. 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 WTP   Neuro/Psych Anxiety Bipolar Disorder negative neurological ROS  negative psych ROS   GI/Hepatic hiatal hernia, GERD  Medicated and Controlled,(+) Hepatitis -, CStates treated twice for hep C  Poor historian  Reports last IVDA ~2002   Endo/Other  negative endocrine ROS  Renal/GU negative Renal ROS  negative genitourinary   Musculoskeletal  (+) Arthritis , Osteoarthritis,    Abdominal   Peds negative pediatric ROS (+)  Hematology negative hematology ROS (+)   Anesthesia Other Findings   Reproductive/Obstetrics negative OB ROS                             Anesthesia Physical Anesthesia Plan  ASA: III  Anesthesia Plan: General   Post-op Pain Management:    Induction: Intravenous  PONV Risk Score and Plan: 1 and Midazolam, Ondansetron and Treatment may vary due to age or medical condition  Airway Management Planned: Oral ETT  Additional Equipment:   Intra-op Plan:   Post-operative Plan: Extubation in OR  Informed Consent: I have reviewed the patients History and Physical, chart, labs and discussed the procedure including the risks, benefits and alternatives for the  proposed anesthesia with the patient or authorized representative who has indicated his/her understanding and acceptance.     Dental advisory given  Plan Discussed with: CRNA  Anesthesia Plan Comments: (Plan Full PPE use  Plan GA with GETA as needed d/w pt -WTP with same after Q&A  Very poor historian )        Anesthesia Quick Evaluation

## 2019-02-23 NOTE — Op Note (Signed)
Patient:  [name]  DOB:  11/12/1960  MRN:  [id number]   Preop Diagnosis: Recurrent incisional hernia  Postop Diagnosis: Same  Procedure: Laparoscopic recurrent incisional herniorrhaphy with mesh  Surgeon: Aviva Signs, MD  Anes: General endotracheal  Indications: Patient is a 58 year old white male status post multiple ventral herniorrhaphies around the umbilicus who presents for a laparoscopic incisional hernia repair.  The risks and benefits of the procedure including bleeding, infection, bowel injury, mesh use, the possibility of recurrence of the hernia were fully explained to the patient, who gave informed consent.  Due to his protein S deficiency, he was bridged with Lovenox injections.  Procedure note: The patient was placed in the supine position.  After induction of general endotracheal anesthesia, the abdomen was prepped and draped using the usual sterile technique with ChloraPrep.  Surgical site confirmation was performed.  An incision was made in the epigastric region.  A Veress needle was introduced into the abdominal cavity and confirmation of placement was done using saline drop test.  The abdomen was then insufflated the 15 mmHg pressure.  An 11 mm trocar was placed in the left upper quadrant under direct visualization without difficulty.  There was no evidence of injury to the liver.  An additional 5 mm trocar was placed in the right upper quadrant and left lower quadrant regions under direct visualization.  The patient had omentum that was attached to the previous hernia repair of the umbilicus.  There was a defect in the fascia is noted to the left and lateral to the umbilicus.  Given these findings, a 10 x 15 cm Ventralight DualMesh was used in place with the long axis transversely.  It was secured in place of the abdominal wall using 2-0 Prolene tacking sutures.  A protacker was then used circumferentially in a 2 layer fashion to provide greater than 2 to 3 cm coverage  of the hernia defect.  No bowel injury was noted at the end of the procedure.  No abnormal bleeding was noted at the end of the procedure.  All air was evacuated from the abdominal cavity prior to removal of the trochars.  All wounds were injected with Exparel.  The incisions were closed using 4-0 Monocryl subcuticular sutures.  Dermabond was applied.  All tape and needle counts were correct at the end of the procedure.  The patient was extubated in the operating room and transferred to PACU in stable condition.  Complications: None  EBL: Minimal  Specimen: None

## 2019-02-23 NOTE — Interval H&P Note (Signed)
History and Physical Interval Note:  02/23/2019 8:19 AM  [name]  has presented today for surgery, with the diagnosis of HERNIA.  The various methods of treatment have been discussed with the patient and family. After consideration of risks, benefits and other options for treatment, the patient has consented to  Procedure(s): [place] (N/A) as a surgical intervention.  The patient's history has been reviewed, patient examined, no change in status, stable for surgery.  I have reviewed the patient's chart and labs.  Questions were answered to the patient's satisfaction.     Aviva Signs

## 2019-02-23 NOTE — Transfer of Care (Signed)
Immediate Anesthesia Transfer of Care Note  Patient: [name]  Procedure(s) Performed: LAPAROSCOPIC INCISIONAL HERNIA REPAIR WITH MESH (N/A Abdomen)  Patient Location: PACU  Anesthesia Type:General  Level of Consciousness: awake, alert  and oriented  Airway & Oxygen Therapy: Patient Spontanous Breathing and Patient connected to face mask oxygen  Post-op Assessment: Report given to RN and Post -op Vital signs reviewed and stable  Post vital signs: Reviewed and stable  Last Vitals:  Vitals Value Taken Time  BP 132/87 02/23/19 1034  Temp    Pulse 74 02/23/19 1037  Resp 25 02/23/19 1037  SpO2 96 % 02/23/19 1037  Vitals shown include unvalidated device data.  Last Pain:  Vitals:   02/23/19 0740  TempSrc: Oral  PainSc: 6       Patients Stated Pain Goal: 8 (00/92/33 0076)  Complications: No apparent anesthesia complications

## 2019-02-23 NOTE — Discharge Instructions (Signed)
Laparoscopic Inguinal Hernia Repair, Adult, Care After This sheet gives you information about how to care for yourself after your procedure. Your health care provider may also give you more specific instructions. If you have problems or questions, contact your health care provider. What can I expect after the procedure? After the procedure, it is common to have:  Pain.  Swelling and bruising around the incision area.  Scrotal swelling, in men.  Some fluid or blood draining from your incisions. Follow these instructions at home: Incision care  Follow instructions from your health care provider about how to take care of your incisions. Make sure you: ? Wash your hands with soap and water before you change your bandage (dressing). If soap and water are not available, use hand sanitizer. ? Change your dressing as told by your health care provider. ? Leave stitches (sutures), skin glue, or adhesive strips in place. These skin closures may need to stay in place for 2 weeks or longer. If adhesive strip edges start to loosen and curl up, you may trim the loose edges. Do not remove adhesive strips completely unless your health care provider tells you to do that.  Check your incision area every day for signs of infection. Check for: ? More redness, swelling, or pain. ? More fluid or blood. ? Warmth. ? Pus or a bad smell.  Wear loose, soft clothing while your incisions heal. Driving  Do not drive or use heavy machinery while taking prescription pain medicine.  Do not drive for 24 hours if you were given a medicine to help you relax (sedative) during your procedure. Activity  Do not lift anything that is heavier than 10 lb (4.5 kg), or the limit that you are told, until your health care provider says that it is safe.  Ask your health care provider what activities are safe for you.A lot of activity during the first week after surgery can increase pain and swelling. For 1 week after your  procedure: ? Avoid activities that take a lot of effort, such as exercise or sports. ? You may walk and climb stairs as needed for daily activity, but avoid long walks or climbing stairs for exercise. Managing pain and swelling   Put ice on painful or swollen areas: ? Put ice in a plastic bag. ? Place a towel between your skin and the bag. ? Leave the ice on for 20 minutes, 2-3 times a day. General instructions  Do not take baths, swim, or use a hot tub until your health care provider approves. Ask your health care provider if you may take showers. You may only be allowed to take sponge baths.  Take over-the-counter and prescription medicines only as told by your health care provider.  To prevent or treat constipation while you are taking prescription pain medicine, your health care provider may recommend that you: ? Drink enough fluid to keep your urine pale yellow. ? Take over-the-counter or prescription medicines. ? Eat foods that are high in fiber, such as fresh fruits and vegetables, whole grains, and beans. ? Limit foods that are high in fat and processed sugars, such as fried and sweet foods.  Do not use any products that contain nicotine or tobacco, such as cigarettes and e-cigarettes. If you need help quitting, ask your health care provider.  Drink enough fluid to keep your urine pale yellow.  Keep all follow-up visits as told by your health care provider. This is important. Contact a health care provider if:  You  have more redness, swelling, or pain around your incisions or your groin area.  You have more swelling in your scrotum.  You have more fluid or blood coming from your incisions.  Your incisions feel warm to the touch.  You have severe pain and medicines do not help.  You have abdominal pain or swelling.  You cannot eat or drink without vomiting.  You cannot urinate or pass a bowel movement.  You faint.  You feel dizzy.  You have nausea and  vomiting.  You have a fever. Get help right away if:  You have pus or a bad smell coming from your incisions.  You have chest pain.  You have problems breathing. Summary  Pain, swelling, and bruising are common after the procedure.  Check your incision area every day for signs of infection, such as more redness, swelling, or pain.  Put ice on painful or swollen areas for 20 minutes, 2-3 times a day. This information is not intended to replace advice given to you by your health care provider. Make sure you discuss any questions you have with your health care provider. Document Released: 07/10/2016 Document Revised: 04/03/2017 Document Reviewed: 07/10/2016 Elsevier Patient Education  [place] POST-ANESTHESIA  IMMEDIATELY FOLLOWING SURGERY:  Do not drive or operate machinery for the first twenty four hours after surgery.  Do not make any important decisions for twenty four hours after surgery or while taking narcotic pain medications or sedatives.  If you develop intractable nausea and vomiting or a severe headache please notify your doctor immediately.  FOLLOW-UP:  Please make an appointment with your surgeon as instructed. You do not need to follow up with anesthesia unless specifically instructed to do so.  WOUND CARE INSTRUCTIONS (if applicable):  Keep a dry clean dressing on the anesthesia/puncture wound site if there is drainage.  Once the wound has quit draining you may leave it open to air.  Generally you should leave the bandage intact for twenty four hours unless there is drainage.  If the epidural site drains for more than 36-48 hours please call the anesthesia department.  QUESTIONS?:  Please feel free to call your physician or the hospital operator if you have any questions, and they will be happy to assist you.

## 2019-02-23 NOTE — Anesthesia Postprocedure Evaluation (Signed)
Anesthesia Post Note  Patient: [name]  Procedure(s) Performed: LAPAROSCOPIC INCISIONAL HERNIA REPAIR WITH MESH (N/A Abdomen)  Patient location during evaluation: PACU Anesthesia Type: General Level of consciousness: awake and alert Pain management: pain level controlled Vital Signs Assessment: post-procedure vital signs reviewed and stable Respiratory status: spontaneous breathing, nonlabored ventilation and respiratory function stable Cardiovascular status: stable Postop Assessment: no apparent nausea or vomiting Anesthetic complications: no     Last Vitals:  Vitals:   02/23/19 0740  BP: 128/83  Pulse: 65  Resp: 16  Temp: 36.9 C  SpO2: 94%    Last Pain:  Vitals:   02/23/19 0740  TempSrc: Oral  PainSc: [name]

## 2019-02-23 NOTE — Anesthesia Procedure Notes (Signed)
Procedure Name: Intubation Date/Time: 02/23/2019 9:11 AM Performed by: Vista Deck, CRNA Pre-anesthesia Checklist: Patient identified, Patient being monitored, Timeout performed, Emergency Drugs available and Suction available Patient Re-evaluated:Patient Re-evaluated prior to induction Oxygen Delivery Method: Circle System Utilized Preoxygenation: Pre-oxygenation with 100% oxygen Induction Type: IV induction Laryngoscope Size: Mac and 4 Grade View: Grade I Tube type: Oral Tube size: 8.0 mm Number of attempts: 1 Airway Equipment and Method: stylet Placement Confirmation: ETT inserted through vocal cords under direct vision,  positive ETCO2 and breath sounds checked- equal and bilateral Secured at: 23 cm Tube secured with: Tape Dental Injury: Teeth and Oropharynx as per pre-operative assessment

## 2019-02-23 NOTE — Anesthesia Postprocedure Evaluation (Signed)
Anesthesia Post Note  Patient: [name]  Procedure(s) Performed: LAPAROSCOPIC INCISIONAL HERNIA REPAIR WITH MESH (N/A Abdomen)  Patient location during evaluation: PACU Anesthesia Type: General Level of consciousness: awake and alert and patient cooperative Pain management: satisfactory to patient Vital Signs Assessment: post-procedure vital signs reviewed and stable Respiratory status: spontaneous breathing Cardiovascular status: stable Postop Assessment: no apparent nausea or vomiting Anesthetic complications: no     Last Vitals:  Vitals:   02/23/19 0740 02/23/19 1040  BP: 128/83 132/87  Pulse: 65 79  Resp: 16 18  Temp: 36.9 C 36.6 C  SpO2: 94% 96%    Last Pain:  Vitals:   02/23/19 1040  TempSrc:   PainSc: Asleep                 Randal Yepiz

## 2019-02-24 ENCOUNTER — Encounter (HOSPITAL_COMMUNITY): Payer: Self-pay | Admitting: General Surgery

## 2019-02-25 ENCOUNTER — Encounter: Payer: Self-pay | Admitting: Cardiology

## 2019-02-25 ENCOUNTER — Other Ambulatory Visit: Payer: Self-pay

## 2019-02-25 ENCOUNTER — Telehealth (INDEPENDENT_AMBULATORY_CARE_PROVIDER_SITE_OTHER): Payer: Medicare Other | Admitting: Cardiology

## 2019-02-25 DIAGNOSIS — Z86711 Personal history of pulmonary embolism: Secondary | ICD-10-CM | POA: Diagnosis not present

## 2019-02-25 DIAGNOSIS — Z87898 Personal history of other specified conditions: Secondary | ICD-10-CM

## 2019-02-25 NOTE — Addendum Note (Signed)
Addendum  created 02/25/19 0956 by Vista Deck, CRNA   Flowsheet accepted, Intraprocedure Flowsheets edited

## 2019-02-25 NOTE — Patient Instructions (Signed)
Medication Instructions:  Your physician recommends that you continue on your current medications as directed. Please refer to the Current Medication list given to you today.  *If you need a refill on your cardiac medications before your next appointment, please call your pharmacy*  Lab Work: None today If you have labs (blood work) drawn today and your tests are completely normal, you will receive your results only by: Marland Kitchen MyChart Message (if you have MyChart) OR . A paper copy in the mail If you have any lab test that is abnormal or we need to change your treatment, we will call you to review the results.  Testing/Procedures: None today  Follow-Up: At [facility], you and your health needs are our priority.  As part of our continuing mission to provide you with exceptional heart care, we have created designated Provider Care Teams.  These Care Teams include your primary Cardiologist (physician) and Advanced Practice Providers (APPs -  Physician Assistants and Nurse Practitioners) who all work together to provide you with the care you need, when you need it.  Your next appointment:   As needed, continue to follow up with Dr.Kim   Thank you for choosing [facility] !

## 2019-02-25 NOTE — Progress Notes (Signed)
Virtual Visit via Telephone Note   This visit type was conducted due to national recommendations for restrictions regarding the COVID-19 Pandemic (e.g. social distancing) in an effort to limit this patient's exposure and mitigate transmission in our community.  Due to his co-morbid illnesses, this patient is at least at moderate risk for complications without adequate follow up.  This format is felt to be most appropriate for this patient at this time.  The patient did not have access to video technology/had technical difficulties with video requiring transitioning to audio format only (telephone).  All issues noted in this document were discussed and addressed.  No physical exam could be performed with this format.  Please refer to the patient's chart for his  consent to telehealth for [facility]   Date:  02/25/2019   ID:  [name], DOB Apr 02, 1961, MRN [id number]  Patient Location: Home Provider Location: Office  PCP:  Pearson Grippe, MD  Cardiologist:  Jonelle Sidle, MD Electrophysiologist:  None   Evaluation Performed:  Follow-Up Visit  Chief Complaint:   Cardiac follow-up  History of Present Illness:    [name] is a 58 y.o. male that I have not seen since 2017.  Visit scheduled as he was overdue, he requested a virtual encounter due to recent surgery. Records indicate recent laparoscopic incisional herniorrhaphy with mesh per Dr. Lovell Sheehan.  He was off Xarelto and temporarily on a Lovenox bridge.  He follows with PCP, history of protein S deficiency and prior pulmonary embolus.  No obvious perioperative complications reported.  States that he is still sore but recovering.  From a cardiac perspective he does not report any recurring palpitations.  He was evaluated in the past in terms of ischemic work-up in 2017 and assessment of LVEF by echocardiography.  He does not report exertional chest pain.  Reported blood pressure is normal.  He continues to follow at the [facility]  clinic.  Recent ECG normal.  The patient does not have symptoms concerning for COVID-19 infection (fever, chills, cough, or new shortness of breath).    Past Medical History:  Diagnosis Date  . Anxiety   . Arthritis   . Asthma   . Bipolar disorder (HCC)   . Chronic pain   . COPD (chronic obstructive pulmonary disease) (HCC)   . Degenerative disk disease   . GERD (gastroesophageal reflux disease)   . Hiatal hernia   . History of pneumonia   . PE (pulmonary embolism) 2012  . Protein S deficiency [facility])    Past Surgical History:  Procedure Laterality Date  . CHOLECYSTECTOMY    . CLOSED REDUCTION MANDIBLE WITH MANDIBULOMA  10/21/2011   Procedure: CLOSED REDUCTION MANDIBLE WITH MANDIBULOMAXILLARY FUSION;  Surgeon: Darletta Moll, MD;  Location[facility] OR;  Service: ENT;  Laterality: N/A;  MMF screws  . HAND SURGERY Right 20 years ago  . INCISIONAL HERNIA REPAIR N/A 10/27/2018   Procedure: HERNIA REPAIR INCISIONAL WITH MESH;  Surgeon: Franky Macho, MD;  Location: [facility] ORS;  Service: General;  Laterality: N/A;  . INCISIONAL HERNIA REPAIR N/A 02/23/2019   Procedure: LAPAROSCOPIC INCISIONAL HERNIA REPAIR WITH MESH;  Surgeon: Franky Macho, MD;  Location: [facility] ORS;  Service: General;  Laterality: N/A;  . LEG SURGERY    . MANDIBULAR HARDWARE REMOVAL  11/11/2011   Procedure: MANDIBULAR HARDWARE REMOVAL;  Surgeon: Darletta Moll, MD;  Location: [facility];  Service: ENT;  Laterality: N/A;  . ORIF MANDIBULAR FRACTURE  10/21/2011   Procedure:  OPEN REDUCTION INTERNAL FIXATION (ORIF) MANDIBULAR FRACTURE;  Surgeon: Ascencion Dike, MD;  Location: [facility] OR;  Service: ENT;  Laterality: N/A;  . UMBILICAL HERNIA REPAIR       Current Meds  Medication Sig  . albuterol (PROVENTIL HFA;VENTOLIN HFA) 108 (90 BASE) MCG/ACT inhaler Inhale 2 puffs into the lungs every 4 (four) hours as needed for wheezing or shortness of breath.   . ALPRAZolam (XANAX) 1 MG tablet Take 1 mg by mouth 2 (two) times daily as needed for  anxiety.   Marland Kitchen amphetamine-dextroamphetamine (ADDERALL) 20 MG tablet Take 20 mg by mouth 2 (two) times daily.  Marland Kitchen buPROPion (WELLBUTRIN XL) 150 MG 24 hr tablet Take 150 mg by mouth daily.  Marland Kitchen gabapentin (NEURONTIN) 300 MG capsule Take 1 capsule (300 mg total) by mouth 3 (three) times daily.  . meclizine (ANTIVERT) 12.5 MG tablet Take 12.5 mg by mouth 3 (three) times daily as needed for dizziness.  . Milk Thistle 1000 MG CAPS Take 1,000 mg by mouth daily.   Marland Kitchen omeprazole (PRILOSEC) 40 MG capsule Take 40 mg by mouth daily.  . promethazine (PHENERGAN) 25 MG tablet Take 25 mg by mouth every 6 (six) hours as needed for nausea or vomiting.  Marland Kitchen QUEtiapine (SEROQUEL) 100 MG tablet Take 100 mg by mouth at bedtime.   . Rivaroxaban (XARELTO) 20 MG TABS Take 20 mg by mouth daily.  Marland Kitchen venlafaxine XR (EFFEXOR-XR) 150 MG 24 hr capsule Take 150 mg by mouth daily with breakfast.      Allergies:   Patient has no known allergies.   Social History   Tobacco Use  . Smoking status: Current Every Day Smoker    Packs/day: 0.25    Years: 35.00    Pack years: 8.75    Types: Cigarettes  . Smokeless tobacco: Never Used  Substance Use Topics  . Alcohol use: No    Alcohol/week: 0.0 standard drinks  . Drug use: No     Family Hx: The patient's family history includes Coronary artery disease in his father and mother; Diabetes in an other family member.  ROS:   Please see the history of present illness. All other systems reviewed and are negative.   Prior CV studies:   The following studies were reviewed today:  Lexiscan Myoview 07/04/2015:  There was no ST segment deviation noted during stress.  This is a low risk study.  The left ventricular ejection fraction is severely decreased (<30%).   Poor quality study. No ischemia Cannot interpret inferior wall due to extra cardiac activity and overlying loop of small bowel EF reported as 29% but normal LV size , wall motion and thickening on gated images Suggest  f/u echo and cardiac CTA vs cath to further evaluate   Echocardiogram 08/21/2015: Study Conclusions  - Left ventricle: The cavity size was mildly dilated. Wall   thickness was normal. Systolic function was normal. The estimated   ejection fraction was in the range of 55% to 60%. Wall motion was   normal; there were no regional wall motion abnormalities. Left   ventricular diastolic function parameters were normal. - Aortic valve: Valve area (VTI): 3.31 cm^2. Valve area (Vmax):   3.76 cm^2. - Left atrium: The atrium was moderately dilated. - Right atrium: The atrium was mildly dilated. - Technically adequate study.  Labs/Other Tests and Data Reviewed:    EKG:  An ECG dated 02/20/2019 was personally reviewed today and demonstrated:  Normal sinus rhythm.  Recent Labs: 03/02/2018: ALT 18 02/21/2019:  BUN 16; Creatinine, Ser 0.73; Hemoglobin 13.2; Platelets 175; Potassium 4.1; Sodium 138    Wt Readings from Last 3 Encounters:  02/25/19 243 lb (110.2 kg)  02/23/19 241 lb 13.5 oz (109.7 kg)  02/08/19 242 lb (109.8 kg)     Objective:    Vital Signs:  BP 120/80   Pulse 65   Ht 6\' 4"  (1.93 m)   Wt 243 lb (110.2 kg)   BMI 29.58 kg/m    Patient spoke in full sentences, not short of breath. No audible wheezing or coughing. Speech pattern normal.  ASSESSMENT & PLAN:    1.  Prior history of chest pain and palpitations, no significant recurrences.  Ischemic and structural cardiac work-up performed in 2017.  He does not report any substantial palpitations at this time and his recent ECG was normal.  Cardiac monitor demonstrated no arrhythmias in 2017.  We will continue with observation and basic risk factor modification strategies, keep follow-up with PCP.  2.  Protein S deficiency with history of pulmonary embolus.  He is on Xarelto with follow-up by PCP.  COVID-19 Education: The signs and symptoms of COVID-19 were discussed with the patient and how to seek care for testing (follow up  with PCP or arrange E-visit).  The importance of social distancing was discussed today.  Time:   Today, I have spent 5 minutes with the patient with telehealth technology discussing the above problems.     Medication Adjustments/Labs and Tests Ordered: Current medicines are reviewed at length with the patient today.  Concerns regarding medicines are outlined above.   Tests Ordered: No orders of the defined types were placed in this encounter.   Medication Changes: No orders of the defined types were placed in this encounter.   Follow Up:  As needed.  Keep regular visits with PCP.   Signed, Nona DellSamuel , MD  02/25/2019 4:36 PM    [facility]

## 2019-02-28 ENCOUNTER — Other Ambulatory Visit: Payer: Self-pay

## 2019-02-28 ENCOUNTER — Telehealth: Payer: Self-pay

## 2019-02-28 NOTE — Telephone Encounter (Signed)
Patients wife called regarding pain medication. Stated that MD did not prescribed any pain meds after surgery. Wife says that patient is on suboxone. Patient got on the phone yelling that he talked to his doctor and was going to call his lawyer. MD notified.

## 2019-03-01 ENCOUNTER — Other Ambulatory Visit: Payer: Self-pay | Admitting: General Surgery

## 2019-03-01 ENCOUNTER — Telehealth (INDEPENDENT_AMBULATORY_CARE_PROVIDER_SITE_OTHER): Payer: Self-pay | Admitting: General Surgery

## 2019-03-01 DIAGNOSIS — Z09 Encounter for follow-up examination after completed treatment for conditions other than malignant neoplasm: Secondary | ICD-10-CM

## 2019-03-01 MED ORDER — OXYCODONE-ACETAMINOPHEN 7.5-325 MG PO TABS: 1.0000 | ORAL_TABLET | ORAL | 0 refills | Status: AC | PRN

## 2019-03-01 NOTE — Telephone Encounter (Signed)
Called patient this morning.  We discussed his postoperative pain management.  He should stop his Suboxone and I have written for Percocet.  He was also told not to be abusive over the phone to medical staff.  He understood and apologized.

## 2019-10-17 ENCOUNTER — Inpatient Hospital Stay (HOSPITAL_COMMUNITY): Payer: Medicare Other

## 2019-10-17 ENCOUNTER — Emergency Department (HOSPITAL_COMMUNITY): Payer: Medicare Other

## 2019-10-17 ENCOUNTER — Encounter (HOSPITAL_COMMUNITY): Payer: Self-pay

## 2019-10-17 ENCOUNTER — Inpatient Hospital Stay (HOSPITAL_COMMUNITY)
Admission: EM | Admit: 2019-10-17 | Discharge: 2019-10-19 | DRG: 193 | Disposition: A | Discharge status: Home / Self Care | Payer: Medicare Other | Attending: Family Medicine | Admitting: Family Medicine

## 2019-10-17 ENCOUNTER — Other Ambulatory Visit: Payer: Self-pay

## 2019-10-17 DIAGNOSIS — Z7901 Long term (current) use of anticoagulants: Secondary | ICD-10-CM

## 2019-10-17 DIAGNOSIS — M1990 Unspecified osteoarthritis, unspecified site: Secondary | ICD-10-CM | POA: Diagnosis present

## 2019-10-17 DIAGNOSIS — Z9114 Patient's other noncompliance with medication regimen: Secondary | ICD-10-CM | POA: Diagnosis not present

## 2019-10-17 DIAGNOSIS — Z79899 Other long term (current) drug therapy: Secondary | ICD-10-CM

## 2019-10-17 DIAGNOSIS — R61 Generalized hyperhidrosis: Secondary | ICD-10-CM | POA: Diagnosis present

## 2019-10-17 DIAGNOSIS — Z8249 Family history of ischemic heart disease and other diseases of the circulatory system: Secondary | ICD-10-CM | POA: Diagnosis not present

## 2019-10-17 DIAGNOSIS — K219 Gastro-esophageal reflux disease without esophagitis: Secondary | ICD-10-CM | POA: Diagnosis present

## 2019-10-17 DIAGNOSIS — G8929 Other chronic pain: Secondary | ICD-10-CM | POA: Diagnosis present

## 2019-10-17 DIAGNOSIS — J9601 Acute respiratory failure with hypoxia: Secondary | ICD-10-CM | POA: Diagnosis present

## 2019-10-17 DIAGNOSIS — R7989 Other specified abnormal findings of blood chemistry: Secondary | ICD-10-CM | POA: Diagnosis present

## 2019-10-17 DIAGNOSIS — F319 Bipolar disorder, unspecified: Secondary | ICD-10-CM | POA: Diagnosis present

## 2019-10-17 DIAGNOSIS — F1911 Other psychoactive substance abuse, in remission: Secondary | ICD-10-CM | POA: Diagnosis present

## 2019-10-17 DIAGNOSIS — E876 Hypokalemia: Secondary | ICD-10-CM | POA: Diagnosis present

## 2019-10-17 DIAGNOSIS — F419 Anxiety disorder, unspecified: Secondary | ICD-10-CM | POA: Diagnosis present

## 2019-10-17 DIAGNOSIS — Z20822 Contact with and (suspected) exposure to covid-19: Secondary | ICD-10-CM

## 2019-10-17 DIAGNOSIS — J441 Chronic obstructive pulmonary disease with (acute) exacerbation: Secondary | ICD-10-CM | POA: Diagnosis present

## 2019-10-17 DIAGNOSIS — Z833 Family history of diabetes mellitus: Secondary | ICD-10-CM

## 2019-10-17 DIAGNOSIS — M7989 Other specified soft tissue disorders: Secondary | ICD-10-CM | POA: Diagnosis present

## 2019-10-17 DIAGNOSIS — J440 Chronic obstructive pulmonary disease with acute lower respiratory infection: Secondary | ICD-10-CM | POA: Diagnosis present

## 2019-10-17 DIAGNOSIS — Z86711 Personal history of pulmonary embolism: Secondary | ICD-10-CM | POA: Diagnosis not present

## 2019-10-17 DIAGNOSIS — F17203 Nicotine dependence unspecified, with withdrawal: Secondary | ICD-10-CM | POA: Diagnosis present

## 2019-10-17 DIAGNOSIS — J189 Pneumonia, unspecified organism: Principal | ICD-10-CM | POA: Diagnosis present

## 2019-10-17 DIAGNOSIS — Z720 Tobacco use: Secondary | ICD-10-CM

## 2019-10-17 DIAGNOSIS — R52 Pain, unspecified: Secondary | ICD-10-CM

## 2019-10-17 DIAGNOSIS — Z23 Encounter for immunization: Secondary | ICD-10-CM

## 2019-10-17 DIAGNOSIS — D6859 Other primary thrombophilia: Secondary | ICD-10-CM | POA: Diagnosis present

## 2019-10-17 DIAGNOSIS — R609 Edema, unspecified: Secondary | ICD-10-CM

## 2019-10-17 LAB — RAPID URINE DRUG SCREEN, HOSP PERFORMED
Amphetamines: POSITIVE — AB
Barbiturates: NOT DETECTED
Benzodiazepines: POSITIVE — AB
Cocaine: NOT DETECTED
Opiates: NOT DETECTED
Tetrahydrocannabinol: NOT DETECTED

## 2019-10-17 LAB — BASIC METABOLIC PANEL
Anion gap: 11 (ref 5–15)
BUN: 14 mg/dL (ref 6–20)
CO2: 26 mmol/L (ref 22–32)
Calcium: 8.8 mg/dL — ABNORMAL LOW (ref 8.9–10.3)
Chloride: 99 mmol/L (ref 98–111)
Creatinine, Ser: 0.62 mg/dL (ref 0.61–1.24)
GFR calc Af Amer: >60 mL/min (ref >60)
GFR calc non Af Amer: >60 mL/min (ref >60)
Glucose, Bld: 182 mg/dL — ABNORMAL HIGH (ref 70–99)
Potassium: 3.3 mmol/L — ABNORMAL LOW (ref 3.5–5.1)
Sodium: 136 mmol/L (ref 135–145)

## 2019-10-17 LAB — CBC WITH DIFFERENTIAL/PLATELET
Abs Immature Granulocytes: 0.04 10*3/uL (ref 0.00–0.07)
Basophils Absolute: 0 10*3/uL (ref 0.0–0.1)
Basophils Relative: 0 %
Eosinophils Absolute: 0 10*3/uL (ref 0.0–0.5)
Eosinophils Relative: 0 %
HCT: 35.1 % — ABNORMAL LOW (ref 39.0–52.0)
Hemoglobin: 11.3 g/dL — ABNORMAL LOW (ref 13.0–17.0)
Immature Granulocytes: 0 %
Lymphocytes Relative: 4 %
Lymphs Abs: 0.4 10*3/uL — ABNORMAL LOW (ref 0.7–4.0)
MCH: 30.3 pg (ref 26.0–34.0)
MCHC: 32.2 g/dL (ref 30.0–36.0)
MCV: 94.1 fL (ref 80.0–100.0)
Monocytes Absolute: 0.4 10*3/uL (ref 0.1–1.0)
Monocytes Relative: 4 %
Neutro Abs: 10.1 10*3/uL — ABNORMAL HIGH (ref 1.7–7.7)
Neutrophils Relative %: 92 %
Platelets: 187 10*3/uL (ref 150–400)
RBC: 3.73 MIL/uL — ABNORMAL LOW (ref 4.22–5.81)
RDW: 14.5 % (ref 11.5–15.5)
WBC: 11 10*3/uL — ABNORMAL HIGH (ref 4.0–10.5)
nRBC: 0 % (ref 0.0–0.2)

## 2019-10-17 LAB — LACTIC ACID, PLASMA
Lactic Acid, Venous: 2 mmol/L (ref 0.5–1.9)
Lactic Acid, Venous: 1.2 mmol/L (ref 0.5–1.9)

## 2019-10-17 LAB — SARS CORONAVIRUS 2 BY RT PCR (HOSPITAL ORDER, PERFORMED IN ~~LOC~~ HOSPITAL LAB): SARS Coronavirus 2: NEGATIVE

## 2019-10-17 LAB — BRAIN NATRIURETIC PEPTIDE: B Natriuretic Peptide: 149 pg/mL — ABNORMAL HIGH (ref 0.0–100.0)

## 2019-10-17 LAB — D-DIMER, QUANTITATIVE: D-Dimer, Quant: 2.02 ug/mL-FEU — ABNORMAL HIGH (ref 0.00–0.50)

## 2019-10-17 LAB — GLUCOSE, CAPILLARY: Glucose-Capillary: 174 mg/dL — ABNORMAL HIGH (ref 70–99)

## 2019-10-17 LAB — ETHANOL: Alcohol, Ethyl (B): <10 mg/dL (ref <10)

## 2019-10-17 MED ORDER — BUPRENORPHINE HCL-NALOXONE HCL 8-2 MG SL SUBL
1.0000 | SUBLINGUAL_TABLET | Freq: Three times a day (TID) | SUBLINGUAL | Status: DC
  Administered 2019-10-17 – 2019-10-19 (×6): 1 via SUBLINGUAL
  Filled 2019-10-17 (×6): qty 1

## 2019-10-17 MED ORDER — ENSURE ENLIVE PO LIQD
237.0000 mL | Freq: Two times a day (BID) | ORAL | Status: DC
  Administered 2019-10-17: 237 mL via ORAL

## 2019-10-17 MED ORDER — ALPRAZOLAM 0.5 MG PO TABS
0.5000 mg | ORAL_TABLET | Freq: Two times a day (BID) | ORAL | Status: DC
  Administered 2019-10-17 – 2019-10-19 (×5): 0.5 mg via ORAL
  Filled 2019-10-17 (×4): qty 1
  Filled 2019-10-17: qty 2

## 2019-10-17 MED ORDER — METHYLPREDNISOLONE SODIUM SUCC 125 MG IJ SOLR
125.0000 mg | Freq: Once | INTRAMUSCULAR | Status: AC
  Administered 2019-10-17: 125 mg via INTRAVENOUS
  Filled 2019-10-17: qty 2

## 2019-10-17 MED ORDER — PANTOPRAZOLE SODIUM 40 MG PO TBEC
40.0000 mg | DELAYED_RELEASE_TABLET | Freq: Every day | ORAL | Status: DC
  Administered 2019-10-17 – 2019-10-19 (×3): 40 mg via ORAL
  Filled 2019-10-17 (×3): qty 1

## 2019-10-17 MED ORDER — MOMETASONE FURO-FORMOTEROL FUM 100-5 MCG/ACT IN AERO
2.0000 | INHALATION_SPRAY | Freq: Two times a day (BID) | RESPIRATORY_TRACT | Status: DC
  Administered 2019-10-17 – 2019-10-19 (×4): 2 via RESPIRATORY_TRACT
  Filled 2019-10-17: qty 8.8

## 2019-10-17 MED ORDER — GUAIFENESIN ER 600 MG PO TB12
1200.0000 mg | ORAL_TABLET | Freq: Two times a day (BID) | ORAL | Status: DC
  Administered 2019-10-17 – 2019-10-19 (×5): 1200 mg via ORAL
  Filled 2019-10-17 (×5): qty 2

## 2019-10-17 MED ORDER — ONDANSETRON HCL 4 MG/2ML IJ SOLN
4.0000 mg | Freq: Once | INTRAMUSCULAR | Status: AC
  Administered 2019-10-17: 4 mg via INTRAVENOUS
  Filled 2019-10-17: qty 2

## 2019-10-17 MED ORDER — SODIUM CHLORIDE 0.9 % IV BOLUS
1000.0000 mL | Freq: Once | INTRAVENOUS | Status: AC
  Administered 2019-10-17: 1000 mL via INTRAVENOUS

## 2019-10-17 MED ORDER — BUSPIRONE HCL 5 MG PO TABS
5.0000 mg | ORAL_TABLET | Freq: Two times a day (BID) | ORAL | Status: DC
  Administered 2019-10-17 – 2019-10-19 (×4): 5 mg via ORAL
  Filled 2019-10-17 (×4): qty 1

## 2019-10-17 MED ORDER — GABAPENTIN 300 MG PO CAPS
300.0000 mg | ORAL_CAPSULE | Freq: Three times a day (TID) | ORAL | Status: DC
  Administered 2019-10-17 – 2019-10-19 (×6): 300 mg via ORAL
  Filled 2019-10-17 (×6): qty 1

## 2019-10-17 MED ORDER — IPRATROPIUM-ALBUTEROL 0.5-2.5 (3) MG/3ML IN SOLN
3.0000 mL | Freq: Four times a day (QID) | RESPIRATORY_TRACT | Status: DC
  Administered 2019-10-17 (×2): 3 mL via RESPIRATORY_TRACT
  Filled 2019-10-17: qty 3

## 2019-10-17 MED ORDER — SODIUM CHLORIDE 0.9 % IV SOLN: INTRAVENOUS | Status: DC

## 2019-10-17 MED ORDER — SODIUM CHLORIDE 0.9 % IV SOLN
500.0000 mg | INTRAVENOUS | Status: DC
  Administered 2019-10-17: 500 mg via INTRAVENOUS
  Filled 2019-10-17 (×2): qty 500

## 2019-10-17 MED ORDER — SODIUM CHLORIDE 0.9 % IV SOLN
1.0000 g | Freq: Once | INTRAVENOUS | Status: AC
  Administered 2019-10-17: 1 g via INTRAVENOUS
  Filled 2019-10-17: qty 10

## 2019-10-17 MED ORDER — IOHEXOL 350 MG/ML SOLN
100.0000 mL | Freq: Once | INTRAVENOUS | Status: AC | PRN
  Administered 2019-10-17: 100 mL via INTRAVENOUS

## 2019-10-17 MED ORDER — SODIUM CHLORIDE 0.9 % IV SOLN: 100.0000 mg | Freq: Two times a day (BID) | INTRAVENOUS | Status: DC

## 2019-10-17 MED ORDER — QUETIAPINE FUMARATE 100 MG PO TABS
100.0000 mg | ORAL_TABLET | Freq: Every day | ORAL | Status: DC
  Administered 2019-10-17 – 2019-10-18 (×2): 100 mg via ORAL
  Filled 2019-10-17 (×2): qty 1

## 2019-10-17 MED ORDER — POTASSIUM CHLORIDE CRYS ER 20 MEQ PO TBCR
40.0000 meq | EXTENDED_RELEASE_TABLET | Freq: Once | ORAL | Status: AC
  Administered 2019-10-17: 40 meq via ORAL
  Filled 2019-10-17: qty 2

## 2019-10-17 MED ORDER — AEROCHAMBER PLUS FLO-VU MEDIUM MISC
1.0000 | Freq: Once | Status: AC
  Administered 2019-10-17: 1
  Filled 2019-10-17: qty 1

## 2019-10-17 MED ORDER — ORAL CARE MOUTH RINSE
15.0000 mL | Freq: Two times a day (BID) | OROMUCOSAL | Status: DC
  Administered 2019-10-17 – 2019-10-19 (×4): 15 mL via OROMUCOSAL

## 2019-10-17 MED ORDER — NICOTINE 21 MG/24HR TD PT24
21.0000 mg | MEDICATED_PATCH | Freq: Every day | TRANSDERMAL | Status: DC
  Administered 2019-10-17 – 2019-10-19 (×3): 21 mg via TRANSDERMAL
  Filled 2019-10-17 (×3): qty 1

## 2019-10-17 MED ORDER — ATORVASTATIN CALCIUM 10 MG PO TABS
10.0000 mg | ORAL_TABLET | Freq: Every day | ORAL | Status: DC
  Administered 2019-10-17 – 2019-10-19 (×3): 10 mg via ORAL
  Filled 2019-10-17 (×3): qty 1

## 2019-10-17 MED ORDER — SODIUM CHLORIDE 0.9 % IV SOLN
500.0000 mg | INTRAVENOUS | Status: DC
  Administered 2019-10-18 – 2019-10-19 (×2): 500 mg via INTRAVENOUS
  Filled 2019-10-17 (×2): qty 500

## 2019-10-17 MED ORDER — ACETAMINOPHEN 325 MG PO TABS
650.0000 mg | ORAL_TABLET | Freq: Four times a day (QID) | ORAL | Status: DC | PRN
  Administered 2019-10-17 – 2019-10-19 (×4): 650 mg via ORAL
  Filled 2019-10-17 (×4): qty 2

## 2019-10-17 MED ORDER — SODIUM CHLORIDE 0.9 % IV SOLN
100.0000 mg | Freq: Two times a day (BID) | INTRAVENOUS | Status: DC
  Filled 2019-10-17 (×3): qty 100

## 2019-10-17 MED ORDER — ALBUTEROL SULFATE HFA 108 (90 BASE) MCG/ACT IN AERS
4.0000 | INHALATION_SPRAY | Freq: Once | RESPIRATORY_TRACT | Status: AC
  Administered 2019-10-17: 4 via RESPIRATORY_TRACT
  Filled 2019-10-17: qty 6.7

## 2019-10-17 MED ORDER — VENLAFAXINE HCL ER 75 MG PO CP24
150.0000 mg | ORAL_CAPSULE | Freq: Every day | ORAL | Status: DC
  Administered 2019-10-17 – 2019-10-19 (×3): 150 mg via ORAL
  Filled 2019-10-17 (×3): qty 2

## 2019-10-17 MED ORDER — IPRATROPIUM-ALBUTEROL 0.5-2.5 (3) MG/3ML IN SOLN
3.0000 mL | Freq: Four times a day (QID) | RESPIRATORY_TRACT | Status: DC
  Administered 2019-10-18 – 2019-10-19 (×4): 3 mL via RESPIRATORY_TRACT
  Filled 2019-10-17 (×6): qty 3

## 2019-10-17 MED ORDER — BUPROPION HCL ER (XL) 150 MG PO TB24
150.0000 mg | ORAL_TABLET | Freq: Every day | ORAL | Status: DC
  Administered 2019-10-17 – 2019-10-19 (×3): 150 mg via ORAL
  Filled 2019-10-17 (×3): qty 1

## 2019-10-17 MED ORDER — SODIUM CHLORIDE 0.9 % IV SOLN
1.0000 g | INTRAVENOUS | Status: DC
  Administered 2019-10-18 – 2019-10-19 (×2): 1 g via INTRAVENOUS
  Filled 2019-10-17 (×2): qty 10

## 2019-10-17 MED ORDER — MORPHINE SULFATE (PF) 4 MG/ML IV SOLN
4.0000 mg | Freq: Once | INTRAVENOUS | Status: AC
  Administered 2019-10-17: 4 mg via INTRAVENOUS
  Filled 2019-10-17: qty 1

## 2019-10-17 MED ORDER — PNEUMOCOCCAL VAC POLYVALENT 25 MCG/0.5ML IJ INJ
0.5000 mL | INJECTION | INTRAMUSCULAR | Status: AC
  Administered 2019-10-19: 0.5 mL via INTRAMUSCULAR
  Filled 2019-10-17: qty 0.5

## 2019-10-17 MED ORDER — RIVAROXABAN 20 MG PO TABS
20.0000 mg | ORAL_TABLET | Freq: Every day | ORAL | Status: DC
  Administered 2019-10-18 – 2019-10-19 (×2): 20 mg via ORAL
  Filled 2019-10-17 (×2): qty 1

## 2019-10-17 NOTE — Plan of Care (Signed)

## 2019-10-17 NOTE — ED Notes (Signed)
Lab states lactic and cultures drawn. Will begin antibiotic therapy.

## 2019-10-17 NOTE — ED Provider Notes (Signed)
[facility] EMERGENCY DEPARTMENT Provider Note   CSN: [id number] Arrival date & time: 10/17/19  0827     History Chief Complaint  Patient presents with  . Shortness of Breath    [name] is a 59 y.o. male.  Pt presents to the ED today with SOB.  He has also been having some CP to the right side of his chest.  He has had some subjective fevers and some chills.  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  He is worried he has another blood clot.  He has not been vaccinated against Covid.  He does not smoke tobacco, but vapes.  Pt's O2 sat dropped to the upper 80s with ambulating to the room.  He was put on 2L.        Past Medical History:  Diagnosis Date  . Anxiety   . Arthritis   . Asthma   . Bipolar disorder (HCC)   . Chronic pain   . COPD (chronic obstructive pulmonary disease) (HCC)   . Degenerative disk disease   . GERD (gastroesophageal reflux disease)   . Hiatal hernia   . History of pneumonia   . PE (pulmonary embolism) 2012  . Protein S deficiency [facility])     Patient Active Problem List   Diagnosis Date Noted  . Recurrent incisional hernia   . Incisional hernia, without obstruction or gangrene   . Acute respiratory failure with hypoxia (HCC)   . COPD exacerbation (HCC) 02/07/2015  . Community acquired pneumonia 02/05/2015  . Polysubstance dependence including opioid type drug, episodic abuse (HCC) 02/05/2015  . Healthcare-associated pneumonia 08/17/2013  . Osteoarthritis 08/17/2013  . GERD (gastroesophageal reflux disease) 08/17/2013  . Hx of pulmonary embolus 08/17/2013  . 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 COPD (chronic obstructive pulmonary disease) (HCC) 08/17/2013  . Anxiety 08/17/2013  . Hypokalemia 08/17/2013    Past Surgical History:  Procedure Laterality Date  . CHOLECYSTECTOMY    . CLOSED REDUCTION MANDIBLE WITH MANDIBULOMA  10/21/2011   Procedure: CLOSED REDUCTION MANDIBLE WITH  MANDIBULOMAXILLARY FUSION;  Surgeon: Darletta Moll, MD;  Location: [facility] OR;  Service: ENT;  Laterality: N/A;  MMF screws  . HAND SURGERY Right 20 years ago  . INCISIONAL HERNIA REPAIR N/A 10/27/2018   Procedure: HERNIA REPAIR INCISIONAL WITH MESH;  Surgeon: Franky Macho, MD;  Location: [facility] ORS;  Service: General;  Laterality: N/A;  . INCISIONAL HERNIA REPAIR N/A 02/23/2019   Procedure: LAPAROSCOPIC INCISIONAL HERNIA REPAIR WITH MESH;  Surgeon: Franky Macho, MD;  Location: [facility] ORS;  Service: General;  Laterality: N/A;  . LEG SURGERY    . MANDIBULAR HARDWARE REMOVAL  11/11/2011   Procedure: MANDIBULAR HARDWARE REMOVAL;  Surgeon: Darletta Moll, MD;  Location: [facility];  Service: ENT;  Laterality: N/A;  . ORIF MANDIBULAR FRACTURE  10/21/2011   Procedure: OPEN REDUCTION INTERNAL FIXATION (ORIF) MANDIBULAR FRACTURE;  Surgeon: Darletta Moll, MD;  Location: [facility] OR;  Service: ENT;  Laterality: N/A;  . UMBILICAL HERNIA REPAIR         Family History  Problem Relation Age of Onset  . Coronary artery disease Mother   . Coronary artery disease Father   . Diabetes Other     Social History   Tobacco Use  . Smoking status: Former Smoker    Packs/day: 0.25    Years: 35.00    Pack years: 8.75  . Smokeless tobacco: Never Used  Vaping Use  . Vaping Use: Every day  Substance Use Topics  . Alcohol use: No    Alcohol/week: 0.0 standard drinks  . Drug use: No    Home Medications Prior to Admission medications   Medication Sig Start Date End Date Taking? Authorizing Provider  albuterol (PROVENTIL HFA;VENTOLIN HFA) 108 (90 BASE) MCG/ACT inhaler Inhale 2 puffs into the lungs every 4 (four) hours as needed for wheezing or shortness of breath.     [provider]  ALPRAZolam Prudy Feeler) 1 MG tablet Take 1 mg by mouth 2 (two) times daily as needed for anxiety.  10/15/18   [provider]  amphetamine-dextroamphetamine (ADDERALL) 20 MG tablet Take 20 mg by mouth 2 (two) times daily. 01/17/19    [provider]  buPROPion (WELLBUTRIN XL) 150 MG 24 hr tablet Take 150 mg by mouth daily.    [provider]  enoxaparin (LOVENOX) 150 MG/ML injection Inject into the skin daily. 02/21/19   [provider]  gabapentin (NEURONTIN) 300 MG capsule Take 1 capsule (300 mg total) by mouth 3 (three) times daily. 10/27/18 10/27/19  Franky Macho, MD  meclizine (ANTIVERT) 12.5 MG tablet Take 12.5 mg by mouth 3 (three) times daily as needed for dizziness.    [provider]  Milk Thistle 1000 MG CAPS Take 1,000 mg by mouth daily.     [provider]  omeprazole (PRILOSEC) 40 MG capsule Take 40 mg by mouth daily.    [provider]  oxyCODONE-acetaminophen (PERCOCET) 7.5-325 MG tablet Take 1 tablet by mouth every 4 (four) hours as needed for severe pain. 03/01/19 02/29/20  Franky Macho, MD  promethazine (PHENERGAN) 25 MG tablet Take 25 mg by mouth every 6 (six) hours as needed for nausea or vomiting.    [provider]  QUEtiapine (SEROQUEL) 100 MG tablet Take 100 mg by mouth at bedtime.  08/04/13   [provider]  Rivaroxaban (XARELTO) 20 MG TABS Take 20 mg by mouth daily.    [provider]  SUBOXONE 8-2 MG FILM SMARTSIG:1 Strip(s) By Mouth 3 Times Daily 02/14/19   [provider]  venlafaxine XR (EFFEXOR-XR) 150 MG 24 hr capsule Take 150 mg by mouth daily with breakfast.     [provider]    Allergies    Patient has no known allergies.  Review of Systems   Review of Systems  Respiratory: Positive for shortness of breath.   Cardiovascular: Positive for chest pain.  All other systems reviewed and are negative.   Physical Exam Updated Vital Signs BP 121/66   Pulse 99   Temp 98.8 F (37.1 C) (Oral)   Ht 6\' 4"  (1.93 m)   Wt 108.9 kg   SpO2 93%   BMI 29.21 kg/m   Physical Exam Vitals and nursing note reviewed.  Constitutional:      General: He is in acute distress.     Appearance: He is  well-developed.  HENT:     Head: Normocephalic and atraumatic.     Mouth/Throat:     Mouth: Mucous membranes are moist.     Pharynx: Oropharynx is clear.  Eyes:     Extraocular Movements: Extraocular movements intact.     Pupils: Pupils are equal, round, and reactive to light.  Cardiovascular:     Rate and Rhythm: Regular rhythm. Tachycardia present.  Pulmonary:     Effort: Tachypnea present.     Breath sounds: Rhonchi present.  Abdominal:     General: Bowel sounds  are normal.     Palpations: Abdomen is soft.  Musculoskeletal:        General: Normal range of motion.     Cervical back: Normal range of motion and neck supple.  Skin:    General: Skin is warm.     Capillary Refill: Capillary refill takes less than 2 seconds.  Neurological:     General: No focal deficit present.     Mental Status: He is alert and oriented to person, place, and time.  Psychiatric:        Mood and Affect: Mood normal.        Behavior: Behavior normal.     ED Results / Procedures / Treatments   Labs (all labs ordered are listed, but only abnormal results are displayed) Labs Reviewed  BASIC METABOLIC PANEL - Abnormal; Notable for the following components:      Result Value   Potassium 3.3 (*)    Glucose, Bld 182 (*)    Calcium 8.8 (*)    All other components within normal limits  BRAIN NATRIURETIC PEPTIDE - Abnormal; Notable for the following components:   B Natriuretic Peptide 149.0 (*)    All other components within normal limits  CBC WITH DIFFERENTIAL/PLATELET - Abnormal; Notable for the following components:   WBC 11.0 (*)    RBC 3.73 (*)    Hemoglobin 11.3 (*)    HCT 35.1 (*)    Neutro Abs 10.1 (*)    Lymphs Abs 0.4 (*)    All other components within normal limits  D-DIMER, QUANTITATIVE (NOT AT [facility]) - Abnormal; Notable for the following components:   D-Dimer, Quant 2.02 (*)    All other components within normal limits  LACTIC ACID, PLASMA - Abnormal; Notable for the following  components:   Lactic Acid, Venous 2.0 (*)    All other components within normal limits  SARS CORONAVIRUS 2 BY RT PCR (HOSPITAL ORDER, PERFORMED IN [facility])  CULTURE, BLOOD (ROUTINE X 2)  CULTURE, BLOOD (ROUTINE X 2)  LACTIC ACID, PLASMA    EKG EKG Interpretation  Date/Time:  Monday October 17 2019 09:15:36 EDT Ventricular Rate:  102 PR Interval:    QRS Duration: 99 QT Interval:  327 QTC Calculation: 426 R Axis:   56 Text Interpretation: Sinus tachycardia Borderline T abnormalities, inferior leads Since last tracing rate faster Confirmed by Jacalyn LefevreHaviland, Hulon Ferron [phone]) on 10/17/2019 9:18:20 AM   Radiology DG Chest Port 1 View  Result Date: 10/17/2019 CLINICAL DATA:  Right chest pain, shortness of breath, chills and fever EXAM: PORTABLE CHEST 1 VIEW COMPARISON:  03/02/2018 FINDINGS: New right midlung airspace opacities compatible with pneumonia. Left lung remains clear. No effusion, edema pattern or pneumothorax. Normal heart size. Trachea midline. IMPRESSION: Acute right midlung airspace process compatible with pneumonia Electronically Signed   By: Judie PetitM.  Shick M.D.   On: 10/17/2019 10:34    Procedures Procedures (including critical care time)  Medications Ordered in ED Medications  azithromycin (ZITHROMAX) 500 mg in sodium chloride 0.9 % 250 mL IVPB (has no administration in time range)  sodium chloride 0.9 % bolus 1,000 mL (has no administration in time range)  methylPREDNISolone sodium succinate (SOLU-MEDROL) 125 mg/2 mL injection 125 mg (125 mg Intravenous Given 10/17/19 0937)  albuterol (VENTOLIN HFA) 108 (90 Base) MCG/ACT inhaler 4 puff (4 puffs Inhalation Given 10/17/19 0937)  AeroChamber Plus Flo-Vu Medium MISC 1 each (1 each Other Given 10/17/19 0937)  cefTRIAXone (ROCEPHIN) 1 g in sodium chloride 0.9 % 100  mL IVPB (0 g Intravenous Stopped 10/17/19 1126)  morphine 4 MG/ML injection 4 mg (4 mg Intravenous Given 10/17/19 1126)  ondansetron (ZOFRAN) injection 4 mg (4 mg  Intravenous Given 10/17/19 1126)    ED Course  I have reviewed the triage vital signs and the nursing notes.  Pertinent labs & imaging results that were available during my care of the patient were reviewed by me and considered in my medical decision making (see chart for details).    MDM Rules/Calculators/A&P                          Pt's O2 sat maintained in the low to mid 90s on 2L.  Pt given solumedrol and albuterol which has helped a little with his sob.  Pt does have a large pneumonia in the RML.  He is given rocephin and zithromax.  He is negative for Covid.  Pt is still sob with movement.   Pt d/w Dr. Margo Aye ([facility]) for admission.  CRITICAL CARE Performed by: Jacalyn Lefevre   Total critical care time: 30 minutes  Critical care time was exclusive of separately billable procedures and treating other patients.  Critical care was necessary to treat or prevent imminent or life-threatening deterioration.  Critical care was time spent personally by me on the following activities: development of treatment plan with patient and/or surrogate as well as nursing, discussions with consultants, evaluation of patient's response to treatment, examination of patient, obtaining history from patient or surrogate, ordering and performing treatments and interventions, ordering and review of laboratory studies, ordering and review of radiographic studies, pulse oximetry and re-evaluation of patient's condition.  [name] was evaluated in Emergency Department on 10/17/2019 for the symptoms described in the history of present illness. He was evaluated in the context of the global COVID-19 pandemic, which necessitated consideration that the patient might be at risk for infection with the SARS-CoV-2 virus that causes COVID-19. Institutional protocols and algorithms that pertain to the evaluation of patients at risk for COVID-19 are in a state of rapid change based on information released by regulatory  bodies including the CDC and federal and state organizations. These policies and algorithms were followed during the patient's care in the ED. Final Clinical Impression(s) / ED Diagnoses Final diagnoses:  Community acquired pneumonia of right middle lobe of lung  COPD exacerbation (HCC)  Current every day nicotine vaping  COVID-19 virus not detected  Acute respiratory failure with hypoxia [facility])    Rx / DC Orders ED Discharge Orders    None       Jacalyn Lefevre, MD 10/17/19 1133

## 2019-10-17 NOTE — ED Notes (Signed)
CRITICAL VALUE ALERT  Critical Value:  Lactic acid  2.0  Date & Time Notied:  10/17/19  1112  Provider Notified: havilland  Orders Received/Actions taken:

## 2019-10-17 NOTE — H&P (Signed)
History and Physical  [name] YSA:[id number] DOB: 1960-05-27 DOA: 10/17/2019  Referring physician: Dr. Particia Nearing  PCP: Pearson Grippe, MD  Outpatient Specialists: Cardiology Patient coming from: Home  Chief Complaint: Pain on right side of chest for 4 days.  HPI: [name] is a 59 y.o. male with medical history significant for PE on Xarelto, protein S deficiency, asthma, COPD, chronic anxiety/depression/bipolar disorder, tobacco use disorder who presented to [facility] ED with complaints of right-sided chest pain, pleuritic, worse with taking a deep breath, of 4 days duration.  Associated with a productive cough, dyspnea with minimal exertion, night sweats and chills.  Reports noncompliance with Xarelto, forgetting to take it sometimes.  Admits to vaping 7-8 times per day.  Ongoing tobacco use since the age of 52.  No significant weight loss.  Denies any recent lengthy trips, exposure to tuberculosis or traveling outside of the [place].  He presented to the ED for further evaluation and management.  ED Course: Upon presentation to the ED, hypoxic with O2 saturation in the mid 80s on room air.  Chest x-ray suggestive of right middle lobe pneumonia.  Denies any use of alcohol or passing out.  Started on empiric IV antibiotics for community-acquired pneumonia in the ED.  TR[facility] asked to admit.  Review of Systems: Review of systems as noted in the HPI. All other systems reviewed and are negative.   Past Medical History:  Diagnosis Date  . Anxiety   . Arthritis   . Asthma   . Bipolar disorder (HCC)   . Chronic pain   . COPD (chronic obstructive pulmonary disease) (HCC)   . Degenerative disk disease   . GERD (gastroesophageal reflux disease)   . Hiatal hernia   . History of pneumonia   . PE (pulmonary embolism) 2012  . Protein S deficiency [facility])    Past Surgical History:  Procedure Laterality Date  . CHOLECYSTECTOMY    . CLOSED REDUCTION MANDIBLE WITH MANDIBULOMA  10/21/2011    Procedure: CLOSED REDUCTION MANDIBLE WITH MANDIBULOMAXILLARY FUSION;  Surgeon: Darletta Moll, MD;  Location: [facility] OR;  Service: ENT;  Laterality: N/A;  MMF screws  . HAND SURGERY Right 20 years ago  . INCISIONAL HERNIA REPAIR N/A 10/27/2018   Procedure: HERNIA REPAIR INCISIONAL WITH MESH;  Surgeon: Franky Macho, MD;  Location: [facility];  Service: General;  Laterality: N/A;  . INCISIONAL HERNIA REPAIR N/A 02/23/2019   Procedure: LAPAROSCOPIC INCISIONAL HERNIA REPAIR WITH MESH;  Surgeon: Franky Macho, MD;  Location: [facility];  Service: General;  Laterality: N/A;  . LEG SURGERY    . MANDIBULAR HARDWARE REMOVAL  11/11/2011   Procedure: MANDIBULAR HARDWARE REMOVAL;  Surgeon: Darletta Moll, MD;  Location: [facility];  Service: ENT;  Laterality: N/A;  . ORIF MANDIBULAR FRACTURE  10/21/2011   Procedure: OPEN REDUCTION INTERNAL FIXATION (ORIF) MANDIBULAR FRACTURE;  Surgeon: Darletta Moll, MD;  Location: [facility] OR;  Service: ENT;  Laterality: N/A;  . UMBILICAL HERNIA REPAIR      Social History:  reports that he has quit smoking. He has a 8.75 pack-year smoking history. He has never used smokeless tobacco. He reports that he does not drink alcohol and does not use drugs.   No Known Allergies  Family History  Problem Relation Age of Onset  . Coronary artery disease Mother   . Coronary artery disease Father   . Diabetes Other       Prior to Admission medications   Medication Sig Start  Date End Date Taking? Authorizing Provider  albuterol (PROVENTIL HFA;VENTOLIN HFA) 108 (90 BASE) MCG/ACT inhaler Inhale 2 puffs into the lungs every 4 (four) hours as needed for wheezing or shortness of breath.     [provider]  ALPRAZolam Prudy Feeler) 1 MG tablet Take 1 mg by mouth 2 (two) times daily as needed for anxiety.  10/15/18   [provider]  amphetamine-dextroamphetamine (ADDERALL) 20 MG tablet Take 20 mg by mouth 2 (two) times daily. 01/17/19   [provider]  buPROPion (WELLBUTRIN XL)  150 MG 24 hr tablet Take 150 mg by mouth daily.    [provider]  enoxaparin (LOVENOX) 150 MG/ML injection Inject into the skin daily. 02/21/19   [provider]  gabapentin (NEURONTIN) 300 MG capsule Take 1 capsule (300 mg total) by mouth 3 (three) times daily. 10/27/18 10/27/19  Franky Macho, MD  meclizine (ANTIVERT) 12.5 MG tablet Take 12.5 mg by mouth 3 (three) times daily as needed for dizziness.    [provider]  Milk Thistle 1000 MG CAPS Take 1,000 mg by mouth daily.     [provider]  omeprazole (PRILOSEC) 40 MG capsule Take 40 mg by mouth daily.    [provider]  oxyCODONE-acetaminophen (PERCOCET) 7.5-325 MG tablet Take 1 tablet by mouth every 4 (four) hours as needed for severe pain. 03/01/19 02/29/20  Franky Macho, MD  promethazine (PHENERGAN) 25 MG tablet Take 25 mg by mouth every 6 (six) hours as needed for nausea or vomiting.    [provider]  QUEtiapine (SEROQUEL) 100 MG tablet Take 100 mg by mouth at bedtime.  08/04/13   [provider]  Rivaroxaban (XARELTO) 20 MG TABS Take 20 mg by mouth daily.    [provider]  SUBOXONE 8-2 MG FILM SMARTSIG:1 Strip(s) By Mouth 3 Times Daily 02/14/19   [provider]  venlafaxine XR (EFFEXOR-XR) 150 MG 24 hr capsule Take 150 mg by mouth daily with breakfast.     [provider]    Physical Exam: BP 121/66   Pulse 99   Temp 98.8 F (37.1 C) (Oral)   Ht 6\' 4"  (1.93 m)   Wt 108.9 kg   SpO2 93%   BMI 29.21 kg/m   . General: 59 y.o. year-old male well developed well nourished in no acute distress.  Alert and oriented x3. . Cardiovascular: Regular rate and rhythm with no rubs or gallops.  No thyromegaly or JVD noted.  Trace lower extremity edema. 2/4 pulses in all 4 extremities. 58 Respiratory: Diffuse rales noted bilaterally. Good inspiratory effort. . Abdomen: Soft nontender nondistended with normal bowel sounds x4  quadrants. . Muskuloskeletal: No cyanosis or clubbing.  Trace lower extremity edema noted bilaterally. . Neuro: CN II-XII intact, strength, sensation, reflexes . Skin: Hyperpigmentation in lower extremities bilaterally likely from chronic venous stasis.  Marland Kitchen Psychiatry: Judgement and insight appear normal. Mood is appropriate for condition and setting          Labs on Admission:  Basic Metabolic Panel: Recent Labs  Lab 10/17/19 0930  NA 136  K 3.3*  CL 99  CO2 26  GLUCOSE 182*  BUN 14  CREATININE 0.62  CALCIUM 8.8*   Liver Function Tests: No results for input(s): AST, ALT, ALKPHOS, BILITOT, PROT, ALBUMIN in the last 168 hours. No results for input(s): LIPASE, AMYLASE in the last 168 hours. No results for input(s): AMMONIA in the last 168 hours. CBC: Recent Labs  Lab 10/17/19 0930  WBC  11.0*  NEUTROABS 10.1*  HGB 11.3*  HCT 35.1*  MCV 94.1  PLT 187   Cardiac Enzymes: No results for input(s): CKTOTAL, CKMB, CKMBINDEX, TROPONINI in the last 168 hours.  BNP (last 3 results) Recent Labs    10/17/19 0930  BNP 149.0*    ProBNP (last 3 results) No results for input(s): PROBNP in the last 8760 hours.  CBG: No results for input(s): GLUCAP in the last 168 hours.  Radiological Exams on Admission: DG Chest Port 1 View  Result Date: 10/17/2019 CLINICAL DATA:  Right chest pain, shortness of breath, chills and fever EXAM: PORTABLE CHEST 1 VIEW COMPARISON:  03/02/2018 FINDINGS: New right midlung airspace opacities compatible with pneumonia. Left lung remains clear. No effusion, edema pattern or pneumothorax. Normal heart size. Trachea midline. IMPRESSION: Acute right midlung airspace process compatible with pneumonia Electronically Signed   By: Judie PetitM.  Shick M.D.   On: 10/17/2019 10:34    EKG: I independently viewed the EKG done and my findings are as followed: Sinus tachycardia with rate of 102 with no specific ST-T changes.  Assessment/Plan Present on Admission: . CAP  (community acquired pneumonia)  Active Problems:   CAP (community acquired pneumonia)  Community-acquired pneumonia, POA Presented with right-sided pleuritic chest pain, productive cough, night sweats and chills Personally reviewed admission chest x-ray which shows right middle lobe infiltrates History of vaping 7-8 times per day Started on empiric IV antibiotics Rocephin and azithromycin in the ED, continue Add IV doxycycline for atypicals Monitor fever curve and WBC Obtain CBC with differential in the morning, procalcitonin  Acute hypoxic respiratory failure secondary to community-acquired pneumonia versus others Admits to not always be compliant with Xarelto, forgetting to take it at times Obtain CTA chest to rule out PE, follow results Not on oxygen supplementation at baseline Presented hypoxic with O2 saturation in the 80s on room air Maintain O2 saturation greater than 90%  Possible history of aspiration, self-reported Speech therapy to assess Aspiration precautions  Right lower extremity swelling and tenderness Elevated D-dimer greater than 2.0 Obtain Doppler ultrasound to rule out DVT  Tobacco use disorder and vaping Reports tobacco use since the age of 59 Chest x-ray lesion concerning, will obtain CT chest to further assess   COPD/asthma Continue bronchodilators Continue O2 supplementation to maintain O2 saturation greater than 90%  History of PE, noncompliant with Xarelto Resume Xarelto Advised on the importance of compliance  History of polysubstance abuse Obtain UDS and alcohol level  Protein S deficiency Continue DOAC  Chronic anxiety/depression/bipolar disorder Stable Resume home regimen  GERD Stable Resume PPI  Hypokalemia Presented with serum potassium 3.3 Replete as indicated Obtain serum magnesium level Repeat BMP in the morning     DVT prophylaxis: Xarelto  Code Status: Full code as stated by the patient himself  Family  Communication: None at bedside  Disposition Plan: Admit to telemetry medical  Consults called: None  Admission status: Inpatient   Status is: Inpatient    Dispo: The patient is from: Home.               Anticipated d/c is to: Home.               Anticipated d/c date is: 10/19/2019               Patient currently not medically stable for discharge due to ongoing treatment for community-acquired pneumonia and ongoing work-up for acute hypoxic respiratory failure.        Darlin Droparole N Oval Cavazos MD [facility] Pager [phone]  If 7PM-7AM, please contact night-coverage [email or web address] Password [facility]  10/17/2019, 11:37 AM

## 2019-10-17 NOTE — ED Notes (Signed)
Pt transported to CT ?

## 2019-10-17 NOTE — ED Notes (Signed)
Report attempted x 1

## 2019-10-17 NOTE — ED Notes (Signed)
Pt perspiring profusely. Body temperature WDL.

## 2019-10-17 NOTE — ED Notes (Signed)
ED TO INPATIENT HANDOFF REPORT  ED Nurse Name and Phone #: [phone]  S Name/Age/Gender [name] 59 y.o. male Room/Bed: APA05/APA05  Code Status   Code Status: Prior  Home/SNF/Other Home Patient oriented to: self, place, time and situation Is this baseline? Yes   Triage Complete: Triage complete  Chief Complaint CAP (community acquired pneumonia) [J18.9]  Triage Note Pt reports for the past 4 days his right lung has been hurting and SOB . Pt has hx of PE and is on xarelto. States he had chills and fever. Reports feels like it did when he had blood clot.    Allergies No Known Allergies  Level of Care/Admitting Diagnosis ED Disposition    ED Disposition Condition Comment   Admit  Hospital Area: [facility] [[id number]]  Level of Care: Telemetry [5]  Covid Evaluation: Confirmed COVID Negative  Diagnosis: CAP (community acquired pneumonia) [825053]  Admitting Physician: Darlin Drop [[id number]]  Attending Physician: Darlin Drop [[id number]]  Estimated length of stay: past midnight tomorrow  Certification:: I certify this patient will need inpatient services for at least 2 midnights       B Medical/Surgery History Past Medical History:  Diagnosis Date   Anxiety    Arthritis    Asthma    Bipolar disorder (HCC)    Chronic pain    COPD (chronic obstructive pulmonary disease) (HCC)    Degenerative disk disease    GERD (gastroesophageal reflux disease)    Hiatal hernia    History of pneumonia    PE (pulmonary embolism) 2012   Protein S deficiency [facility])    Past Surgical History:  Procedure Laterality Date   CHOLECYSTECTOMY     CLOSED REDUCTION MANDIBLE WITH MANDIBULOMA  10/21/2011   Procedure: CLOSED REDUCTION MANDIBLE WITH MANDIBULOMAXILLARY FUSION;  Surgeon: Darletta Moll, MD;  Location: [facility] OR;  Service: ENT;  Laterality: N/A;  MMF screws   HAND SURGERY Right 20 years ago   INCISIONAL HERNIA REPAIR N/A 10/27/2018   Procedure: HERNIA REPAIR  INCISIONAL WITH MESH;  Surgeon: Franky Macho, MD;  Location: [facility];  Service: General;  Laterality: N/A;   INCISIONAL HERNIA REPAIR N/A 02/23/2019   Procedure: LAPAROSCOPIC INCISIONAL HERNIA REPAIR WITH MESH;  Surgeon: Franky Macho, MD;  Location: [facility];  Service: General;  Laterality: N/A;   LEG SURGERY     MANDIBULAR HARDWARE REMOVAL  11/11/2011   Procedure: MANDIBULAR HARDWARE REMOVAL;  Surgeon: Darletta Moll, MD;  Location: [facility];  Service: ENT;  Laterality: N/A;   ORIF MANDIBULAR FRACTURE  10/21/2011   Procedure: OPEN REDUCTION INTERNAL FIXATION (ORIF) MANDIBULAR FRACTURE;  Surgeon: Darletta Moll, MD;  Location: [facility] OR;  Service: ENT;  Laterality: N/A;   UMBILICAL HERNIA REPAIR       A IV Location/Drains/Wounds Patient Lines/Drains/Airways Status    Active Line/Drains/Airways    Name Placement date Placement time Site Days   Peripheral IV 10/17/19 Right Antecubital 10/17/19  0915  Antecubital  less than 1   Incision 10/21/11 Lip Other (Comment) 10/21/11  2037   2918   Incision 11/11/11 N/A 11/11/11  1020   2897   Incision (Closed) 10/27/18 Abdomen 10/27/18  1039   355   Incision (Closed) 02/23/19 Abdomen Other (Comment) 02/23/19  0951   236   Incision - 3 Ports Abdomen 1: Left;Lower;Lateral 2: Lateral;Upper;Left 3: Lateral;Right;Upper 02/23/19  0926   236          Intake/Output Last 24 hours  Intake/Output Summary (  Last 24 hours) at 10/17/2019 1152 Last data filed at 10/17/2019 1126 Gross per 24 hour  Intake 100 ml  Output --  Net 100 ml    Labs/Imaging Results for orders placed or performed during the hospital encounter of 10/17/19 (from the past 48 hour(s))  Basic metabolic panel     Status: Abnormal   Collection Time: 10/17/19  9:30 AM  Result Value Ref Range   Sodium 136 135 - 145 mmol/L   Potassium 3.3 (L) 3.5 - 5.1 mmol/L   Chloride 99 98 - 111 mmol/L   CO2 26 22 - 32 mmol/L   Glucose, Bld 182 (H) 70 - 99 mg/dL    Comment: Glucose reference  range applies only to samples taken after fasting for at least 8 hours.   BUN 14 6 - 20 mg/dL   Creatinine, Ser 3.14 0.61 - 1.24 mg/dL   Calcium 8.8 (L) 8.9 - 10.3 mg/dL   GFR calc non Af Amer >60 >60 mL/min   GFR calc Af Amer >60 >60 mL/min   Anion gap 11 5 - 15    Comment: Performed at [facility], [address] [place], [place] [address]  Brain natriuretic peptide     Status: Abnormal   Collection Time: 10/17/19  9:30 AM  Result Value Ref Range   B Natriuretic Peptide 149.0 (H) 0.0 - 100.0 pg/mL    Comment: Performed at [facility], [address] [place], [place] [address]  CBC with Differential     Status: Abnormal   Collection Time: 10/17/19  9:30 AM  Result Value Ref Range   WBC 11.0 (H) 4.0 - 10.5 K/uL   RBC 3.73 (L) 4.22 - 5.81 MIL/uL   Hemoglobin 11.3 (L) 13.0 - 17.0 g/dL   HCT 85.0 (L) 39 - 52 %   MCV 94.1 80.0 - 100.0 fL   MCH 30.3 26.0 - 34.0 pg   MCHC 32.2 30.0 - 36.0 g/dL   RDW 27.7 41.2 - 87.8 %   Platelets 187 150 - 400 K/uL   nRBC 0.0 0.0 - 0.2 %   Neutrophils Relative % 92 %   Neutro Abs 10.1 (H) 1.7 - 7.7 K/uL   Lymphocytes Relative 4 %   Lymphs Abs 0.4 (L) 0.7 - 4.0 K/uL   Monocytes Relative 4 %   Monocytes Absolute 0.4 0 - 1 K/uL   Eosinophils Relative 0 %   Eosinophils Absolute 0.0 0 - 0 K/uL   Basophils Relative 0 %   Basophils Absolute 0.0 0 - 0 K/uL   Immature Granulocytes 0 %   Abs Immature Granulocytes 0.04 0.00 - 0.07 K/uL    Comment: Performed at [facility] [address], [place], [place] [address]  D-dimer, quantitative (not at [facility])     Status: Abnormal   Collection Time: 10/17/19  9:30 AM  Result Value Ref Range   D-Dimer, Quant 2.02 (H) 0.00 - 0.50 ug/mL-FEU    Comment: (NOTE) At the manufacturer cut-off of 0.50 ug/mL FEU, this assay has been documented to exclude PE with a sensitivity and negative predictive value of 97 to 99%.  At this time, this assay has not been approved by the FDA to exclude DVT/VTE. Results should be  correlated with clinical presentation. Performed at [facility], [address] [place], [place] [address]   SARS Coronavirus 2 by RT PCR (hospital order, performed in [facility] hospital lab) Nasopharyngeal Nasopharyngeal Swab     Status: None   Collection Time: 10/17/19  9:46 AM   Specimen: Nasopharyngeal Swab  Result Value Ref Range   SARS Coronavirus 2 NEGATIVE NEGATIVE    Comment: (NOTE) SARS-CoV-2 target nucleic acids are NOT DETECTED.  The SARS-CoV-2 RNA is generally detectable in upper and lower respiratory specimens during the acute phase of infection. The lowest concentration of SARS-CoV-2 viral copies this assay can detect is 250 copies / mL. A negative result does not preclude SARS-CoV-2 infection and should not be used as the sole basis for treatment or other patient management decisions.  A negative result may occur with improper specimen collection / handling, submission of specimen other than nasopharyngeal swab, presence of viral mutation(s) within the areas targeted by this assay, and inadequate number of viral copies (<250 copies / mL). A negative result must be combined with clinical observations, patient history, and epidemiological information.  Fact Sheet for Patients:   BoilerBrush.com.cyhttps://www.fda.gov/media/136312/download  Fact Sheet for Healthcare Providers: https://pope.com/https://www.fda.gov/media/136313/download  This test is not yet approved or  cleared by the [place] FDA and has been authorized for detection and/or diagnosis of SARS-CoV-2 by FDA under an Emergency Use Authorization (EUA).  This EUA will remain in effect (meaning this test can be used) for the duration of the COVID-19 declaration under Section 564(b)(1) of the Act, 21 U.S.C. section 360bbb-3(b)(1), unless the authorization is terminated or revoked sooner.  Performed at [facility], [address] [place], [place] [address]   Lactic acid, plasma     Status: Abnormal   Collection Time: 10/17/19 10:36  AM  Result Value Ref Range   Lactic Acid, Venous 2.0 (HH) 0.5 - 1.9 mmol/L    Comment: CRITICAL RESULT CALLED TO, READ BACK BY AND VERIFIED WITH: GENTRY,R AT 11:10AM ON 10/17/19 BY [facility]STERMAN,C Performed at [facility], [address] [place], [place] [address]    DG Chest Port 1 View  Result Date: 10/17/2019 CLINICAL DATA:  Right chest pain, shortness of breath, chills and fever EXAM: PORTABLE CHEST 1 VIEW COMPARISON:  03/02/2018 FINDINGS: New right midlung airspace opacities compatible with pneumonia. Left lung remains clear. No effusion, edema pattern or pneumothorax. Normal heart size. Trachea midline. IMPRESSION: Acute right midlung airspace process compatible with pneumonia Electronically Signed   By: Judie PetitM.  Shick M.D.   On: 10/17/2019 10:34    Pending Labs Unresulted Labs (From admission, onward) Comment          Start     Ordered   10/17/19 1139  Urine rapid drug screen (hosp performed)  ONCE - STAT,   STAT        10/17/19 1139   10/17/19 1139  Ethanol  Once,   STAT        10/17/19 1139   10/17/19 1007  Lactic acid, plasma  Now then every 2 hours,   STAT      10/17/19 1006   10/17/19 1007  Culture, blood (routine x 2)  BLOOD CULTURE X 2,   STAT      10/17/19 1006          Vitals/Pain Today's Vitals   10/17/19 0903 10/17/19 1033 10/17/19 1048 10/17/19 1049  BP:  121/66    Pulse:  (!) 101 99   Temp:    98.8 F (37.1 C)  TempSrc:    Oral  SpO2:   93%   Weight: 108.9 kg     Height: 6\' 4"  (1.93 m)     PainSc: 10-Worst pain ever       Isolation Precautions No active isolations  Medications  Medications  azithromycin (ZITHROMAX) 500 mg in sodium chloride 0.9 % 250 mL IVPB (500 mg Intravenous New Bag/Given 10/17/19 1135)  acetaminophen (TYLENOL) tablet 650 mg (has no administration in time range)  methylPREDNISolone sodium succinate (SOLU-MEDROL) 125 mg/2 mL injection 125 mg (125 mg Intravenous Given 10/17/19 0937)  albuterol (VENTOLIN HFA) 108 (90 Base) MCG/ACT inhaler 4  puff (4 puffs Inhalation Given 10/17/19 0937)  AeroChamber Plus Flo-Vu Medium MISC 1 each (1 each Other Given 10/17/19 0937)  cefTRIAXone (ROCEPHIN) 1 g in sodium chloride 0.9 % 100 mL IVPB (0 g Intravenous Stopped 10/17/19 1126)  morphine 4 MG/ML injection 4 mg (4 mg Intravenous Given 10/17/19 1126)  ondansetron (ZOFRAN) injection 4 mg (4 mg Intravenous Given 10/17/19 1126)  sodium chloride 0.9 % bolus 1,000 mL (1,000 mLs Intravenous New Bag/Given 10/17/19 1142)    Mobility walks Low fall risk   Focused Assessments    R Recommendations: See Admitting Provider Note  Report given to:   Additional Notes:

## 2019-10-17 NOTE — ED Triage Notes (Signed)
Pt reports for the past 4 days his right lung has been hurting and SOB . Pt has hx of PE and is on xarelto. States he had chills and fever. Reports feels like it did when he had blood clot.

## 2019-10-18 ENCOUNTER — Inpatient Hospital Stay (HOSPITAL_COMMUNITY): Payer: Medicare Other

## 2019-10-18 LAB — CBC WITH DIFFERENTIAL/PLATELET
Abs Immature Granulocytes: 0.06 10*3/uL (ref 0.00–0.07)
Basophils Absolute: 0 10*3/uL (ref 0.0–0.1)
Basophils Relative: 0 %
Eosinophils Absolute: 0 10*3/uL (ref 0.0–0.5)
Eosinophils Relative: 0 %
HCT: 31.1 % — ABNORMAL LOW (ref 39.0–52.0)
Hemoglobin: 9.8 g/dL — ABNORMAL LOW (ref 13.0–17.0)
Immature Granulocytes: 1 %
Lymphocytes Relative: 7 %
Lymphs Abs: 0.7 10*3/uL (ref 0.7–4.0)
MCH: 30.2 pg (ref 26.0–34.0)
MCHC: 31.5 g/dL (ref 30.0–36.0)
MCV: 95.7 fL (ref 80.0–100.0)
Monocytes Absolute: 0.6 10*3/uL (ref 0.1–1.0)
Monocytes Relative: 6 %
Neutro Abs: 8 10*3/uL — ABNORMAL HIGH (ref 1.7–7.7)
Neutrophils Relative %: 86 %
Platelets: 176 10*3/uL (ref 150–400)
RBC: 3.25 MIL/uL — ABNORMAL LOW (ref 4.22–5.81)
RDW: 14.3 % (ref 11.5–15.5)
WBC: 9.3 10*3/uL (ref 4.0–10.5)
nRBC: 0 % (ref 0.0–0.2)

## 2019-10-18 LAB — BASIC METABOLIC PANEL
Anion gap: 8 (ref 5–15)
BUN: 13 mg/dL (ref 6–20)
CO2: 25 mmol/L (ref 22–32)
Calcium: 8.5 mg/dL — ABNORMAL LOW (ref 8.9–10.3)
Chloride: 105 mmol/L (ref 98–111)
Creatinine, Ser: 0.51 mg/dL — ABNORMAL LOW (ref 0.61–1.24)
GFR calc Af Amer: >60 mL/min (ref >60)
GFR calc non Af Amer: >60 mL/min (ref >60)
Glucose, Bld: 111 mg/dL — ABNORMAL HIGH (ref 70–99)
Potassium: 4.4 mmol/L (ref 3.5–5.1)
Sodium: 138 mmol/L (ref 135–145)

## 2019-10-18 LAB — PROCALCITONIN: Procalcitonin: 0.67 ng/mL

## 2019-10-18 LAB — MAGNESIUM: Magnesium: 2 mg/dL (ref 1.7–2.4)

## 2019-10-18 LAB — PHOSPHORUS: Phosphorus: 2.3 mg/dL — ABNORMAL LOW (ref 2.5–4.6)

## 2019-10-18 NOTE — Evaluation (Signed)
Clinical/Bedside Swallow Evaluation Patient Details  Name: [name] MRN: [id number] Date of Birth: 1960-06-16  Today's Date: 10/18/2019 Time: SLP Start Time (ACUTE ONLY): 1225 SLP Stop Time (ACUTE ONLY): 1258 SLP Time Calculation (min) (ACUTE ONLY): 33 min  Past Medical History:  Past Medical History:  Diagnosis Date  . Anxiety   . Arthritis   . Asthma   . Bipolar disorder (HCC)   . Chronic pain   . COPD (chronic obstructive pulmonary disease) (HCC)   . Degenerative disk disease   . GERD (gastroesophageal reflux disease)   . Hiatal hernia   . History of pneumonia   . PE (pulmonary embolism) 2012  . Protein S deficiency [facility])    Past Surgical History:  Past Surgical History:  Procedure Laterality Date  . CHOLECYSTECTOMY    . CLOSED REDUCTION MANDIBLE WITH MANDIBULOMA  10/21/2011   Procedure: CLOSED REDUCTION MANDIBLE WITH MANDIBULOMAXILLARY FUSION;  Surgeon: Darletta Moll, MD;  Location: [facility] OR;  Service: ENT;  Laterality: N/A;  MMF screws  . HAND SURGERY Right 20 years ago  . INCISIONAL HERNIA REPAIR N/A 10/27/2018   Procedure: HERNIA REPAIR INCISIONAL WITH MESH;  Surgeon: Franky Macho, MD;  Location: [facility];  Service: General;  Laterality: N/A;  . INCISIONAL HERNIA REPAIR N/A 02/23/2019   Procedure: LAPAROSCOPIC INCISIONAL HERNIA REPAIR WITH MESH;  Surgeon: Franky Macho, MD;  Location: [facility];  Service: General;  Laterality: N/A;  . LEG SURGERY    . MANDIBULAR HARDWARE REMOVAL  11/11/2011   Procedure: MANDIBULAR HARDWARE REMOVAL;  Surgeon: Darletta Moll, MD;  Location: [facility];  Service: ENT;  Laterality: N/A;  . ORIF MANDIBULAR FRACTURE  10/21/2011   Procedure: OPEN REDUCTION INTERNAL FIXATION (ORIF) MANDIBULAR FRACTURE;  Surgeon: Darletta Moll, MD;  Location: [facility] OR;  Service: ENT;  Laterality: N/A;  . UMBILICAL HERNIA REPAIR     HPI:  [name] is a 59 y.o. male with medical history significant for PE on Xarelto, protein S deficiency, asthma, COPD, chronic  anxiety/depression/bipolar disorder, tobacco use disorder who presented to [facility], ED with complaints of right-sided chest pain, pleuritic, worse with taking a deep breath, of 4 days duration.  Associated with a productive cough, dyspnea with minimal exertion, night sweats and chills.  Reports noncompliance with Xarelto, forgetting to take it sometimes.  Admits to vaping 7-8 times per day.  Ongoing tobacco use since the age of 48.  No significant weight loss.  Denies any recent lengthy trips, exposure to tuberculosis or traveling outside of the [place].  He presented to the ED for further evaluation and management. Upon presentation to the ED, hypoxic with O2 saturation in the mid 80s on room air.  Chest x-ray suggestive of right middle lobe pneumonia.  Denies any use of alcohol or passing out.  Started on empiric IV antibiotics for community-acquired pneumonia in the ED. Pt reports that he has been vaping for about a year. He had MBSS 01/24/2010.   Assessment / Plan / Recommendation Clinical Impression  Clinical swallow evaluation completed at bedside. Pt with mild breathy vocal quality, missing some dentition, and uvula is reduced on the right side with elevation, otherwise WNL. Pt had MBSS in 2011, however full results are not available. Pt reports occasional globus sensation and need to swallow a "couple of times to get stuff down". Pt with delayed cough/throat clearing after po intake this date (self presented thin water via cup/straw, peaches, and crackers). Given severity of PNA, h/o  of PNA and dysphagia, will proceed with MBSS later this afternoon to objectively evaluate swallow. Pt in agreement with plan of care.     SLP Visit Diagnosis: Dysphagia, unspecified (R13.10)    Aspiration Risk  No limitations    Diet Recommendation Regular;Thin liquid   Liquid Administration via: Cup;Straw Medication Administration: Whole meds with liquid Supervision: Patient able to self feed Postural  Changes: Seated upright at 90 degrees;Remain upright for at least 30 minutes after po intake    Other  Recommendations Oral Care Recommendations: Oral care BID Other Recommendations: Clarify dietary restrictions   Follow up Recommendations  (pending)      Frequency and Duration min 2x/week  1 week       Prognosis Prognosis for Safe Diet Advancement: Good      Swallow Study   General Date of Onset: 10/17/19 HPI: [name] is a 60 y.o. male with medical history significant for PE on Xarelto, protein S deficiency, asthma, COPD, chronic anxiety/depression/bipolar disorder, tobacco use disorder who presented to [facility], ED with complaints of right-sided chest pain, pleuritic, worse with taking a deep breath, of 4 days duration.  Associated with a productive cough, dyspnea with minimal exertion, night sweats and chills.  Reports noncompliance with Xarelto, forgetting to take it sometimes.  Admits to vaping 7-8 times per day.  Ongoing tobacco use since the age of 20.  No significant weight loss.  Denies any recent lengthy trips, exposure to tuberculosis or traveling outside of the [place].  He presented to the ED for further evaluation and management. Upon presentation to the ED, hypoxic with O2 saturation in the mid 80s on room air.  Chest x-ray suggestive of right middle lobe pneumonia.  Denies any use of alcohol or passing out.  Started on empiric IV antibiotics for community-acquired pneumonia in the ED. Pt reports that he has been vaping for about a year. He had MBSS 01/24/2010. Type of Study: Bedside Swallow Evaluation Previous Swallow Assessment: MBSS 01/24/2010 Diet Prior to this Study: Regular;Thin liquids Temperature Spikes Noted: No Respiratory Status: Nasal cannula History of Recent Intubation: No Behavior/Cognition: Alert;Cooperative;Pleasant mood Oral Cavity Assessment: Within Functional Limits Oral Care Completed by SLP: No Oral Cavity - Dentition: Missing  dentition Vision: Functional for self-feeding Self-Feeding Abilities: Able to feed self Patient Positioning: Upright in bed Baseline Vocal Quality: Normal;Breathy Volitional Cough: Strong Volitional Swallow: Able to elicit    Oral/Motor/Sensory Function Overall Oral Motor/Sensory Function: Within functional limits (reduced R uvulae elevation)   Ice Chips Ice chips: Within functional limits Presentation: Spoon   Thin Liquid Thin Liquid: Within functional limits Presentation: Cup;Self Fed;Straw Other Comments: Pt reports need for double swallows at times    Nectar Thick Nectar Thick Liquid: Not tested   Honey Thick Honey Thick Liquid: Not tested   Puree Puree: Within functional limits   Solid     Solid: Within functional limits Presentation: Self Fed     Thank you,  Havery Moros, CCC-SLP [phone] [name] 10/18/2019,12:58 PM

## 2019-10-18 NOTE — Plan of Care (Signed)

## 2019-10-18 NOTE — Evaluation (Signed)
Physical Therapy Evaluation Patient Details Name: [name] MRN: [id number] DOB: 1960-05-25 Today's Date: 10/18/2019   History of Present Illness  [name] is a 59 y.o. male with medical history significant for PE on Xarelto, protein S deficiency, asthma, COPD, chronic anxiety/depression/bipolar disorder, tobacco use disorder who presented to [facility] ED with complaints of right-sided chest pain, pleuritic, worse with taking a deep breath, of 4 days duration.  Associated with a productive cough, dyspnea with minimal exertion, night sweats and chills.  Reports noncompliance with Xarelto, forgetting to take it sometimes.  Admits to vaping 7-8 times per day.  Ongoing tobacco use since the age of 57.  No significant weight loss.  Denies any recent lengthy trips, exposure to tuberculosis or traveling outside of the [place].  He presented to the ED for further evaluation and management.    Clinical Impression  Patient functioning at baseline for functional mobility and gait.  Plan:  Patient discharged from physical therapy to care of nursing for ambulation daily as tolerated for length of stay.     Follow Up Recommendations No PT follow up    Equipment Recommendations  None recommended by PT    Recommendations for Other Services       Precautions / Restrictions Precautions Precautions: None Restrictions Weight Bearing Restrictions: No      Mobility  Bed Mobility Overal bed mobility: Independent                Transfers Overall transfer level: Modified independent                  Ambulation/Gait Ambulation/Gait assistance: Modified independent (Device/Increase time) Gait Distance (Feet): 200 Feet Assistive device: IV Pole Gait Pattern/deviations: WFL(Within Functional Limits) Gait velocity: near normal   General Gait Details: demonstrates good return for ambulation in room and hallways without loss of balance, on room air with SpO2 at  96%  Stairs            Wheelchair Mobility    Modified Rankin (Stroke Patients Only)       Balance Overall balance assessment: No apparent balance deficits (not formally assessed)                                           Pertinent Vitals/Pain Pain Assessment: 0-10 Pain Score: 8  Pain Location: feet andlegs Pain Descriptors / Indicators: Sore;Discomfort Pain Intervention(s): Limited activity within patient's tolerance;Monitored during session    Home Living Family/patient expects to be discharged to:: Private residence Living Arrangements: Alone;Parent Available Help at Discharge: Family;Available PRN/intermittently Type of Home: House Home Access: Level entry     Home Layout: One level Home Equipment: None      Prior Function Level of Independence: Independent         Comments: Tourist information centre manager, drives     Higher education careers adviser        Extremity/Trunk Assessment   Upper Extremity Assessment Upper Extremity Assessment: Overall WFL for tasks assessed    Lower Extremity Assessment Lower Extremity Assessment: Overall WFL for tasks assessed    Cervical / Trunk Assessment Cervical / Trunk Assessment: Normal  Communication   Communication: No difficulties  Cognition Arousal/Alertness: Awake/alert Behavior During Therapy: WFL for tasks assessed/performed Overall Cognitive Status: Within Functional Limits for tasks assessed  General Comments      Exercises     Assessment/Plan    PT Assessment Patent does not need any further PT services  PT Problem List         PT Treatment Interventions      PT Goals (Current goals can be found in the Care Plan section)  Acute Rehab PT Goals Patient Stated Goal: return home PT Goal Formulation: With patient Time For Goal Achievement: 10/18/19 Potential to Achieve Goals: Good    Frequency     Barriers to discharge         Co-evaluation               AM-PAC PT "6 Clicks" Mobility  Outcome Measure Help needed turning from your back to your side while in a flat bed without using bedrails?: None Help needed moving from lying on your back to sitting on the side of a flat bed without using bedrails?: None Help needed moving to and from a bed to a chair (including a wheelchair)?: None Help needed standing up from a chair using your arms (e.g., wheelchair or bedside chair)?: None Help needed to walk in hospital room?: None Help needed climbing 3-5 steps with a railing? : None 6 Click Score: 24    End of Session   Activity Tolerance: Patient tolerated treatment well;Patient limited by fatigue Patient left: in chair;with call bell/phone within reach Nurse Communication: Mobility status PT Visit Diagnosis: Unsteadiness on feet (R26.81);Other abnormalities of gait and mobility (R26.89);Muscle weakness (generalized) (M62.81)    Time: [id number] PT Time Calculation (min) (ACUTE ONLY): 28 min   Charges:   PT Evaluation $PT Eval Moderate Complexity: 1 Mod PT Treatments $Therapeutic Activity: 23-37 mins        9:03 AM, 10/18/19 Ocie Bob, MPT Physical Therapist with [facility] [phone] office [phone] mobile phone

## 2019-10-18 NOTE — Progress Notes (Signed)
PROGRESS NOTE  [name] JGG:[id number] DOB: 03/07/1961 DOA: 10/17/2019 PCP: Pearson Grippe, MD  HPI/Recap of past 24 hours: HPI: [name] is a 59 y.o. male with medical history significant for PE on Xarelto, protein S deficiency, asthma, COPD, chronic anxiety/depression/bipolar disorder, tobacco use disorder who presented to [facility], ED with complaints of right-sided chest pain, pleuritic, worse with taking a deep breath, of 4 days duration.  Associated with a productive cough, dyspnea with minimal exertion, night sweats and chills.  Reports noncompliance with Xarelto, forgetting to take it sometimes.  Admits to vaping 7-8 times per day.  Ongoing tobacco use since the age of 59.  No significant weight loss.  Denies any recent lengthy trips, exposure to tuberculosis or traveling outside of the [place].  He presented to the ED for further evaluation and management.  ED Course: Upon presentation to the ED, hypoxic with O2 saturation in the mid 80s on room air.  Chest x-ray suggestive of right middle lobe pneumonia.  Denies any use of alcohol or passing out.  Started on empiric IV antibiotics for community-acquired pneumonia in the ED.  TRH asked to admit.  10/18/19: Seen and examined.  States he feels better this morning.  He is eager to go home.  Advised to continue IV antibiotics for another 24-48 H due to the severity of his pneumonia.  Reviewed chest x-ray and CT scan in the room with him.  Assessment/Plan: Active Problems:   CAP (community acquired pneumonia)  Community-acquired pneumonia, POA Presented with right-sided pleuritic chest pain, productive cough, night sweats and chills Personally reviewed admission chest x-ray which shows right middle lobe infiltrates Also reviewed CT scan done on 10/17/2019 which showed significant infiltrates involving the right upper and right middle lobe. History of vaping 7-8 times per day, counseled on cessation. Sputum culture pending Continue  empiric IV antibiotics Rocephin and azithromycin day #2 Continue to monitor fever curve and WBC Procalcitonin elevated 0.67, repeat tomorrow Blood cultures negative to date, continue to follow cultures.  Acute hypoxic respiratory failure secondary to community-acquired pneumonia versus others Admits to not always be compliant with Xarelto, forgetting to take it at times CTA chest negative for PE  Not on oxygen supplementation at baseline Presented hypoxic with O2 saturation in the 80s on room air Continue to maintain O2 saturation greater than 90%  Possible history of aspiration, self-reported Speech therapy to assess Continue aspiration precautions  Right lower extremity swelling and tenderness Elevated D-dimer greater than 2.0 Doppler ultrasound negative for DVT  Tobacco use disorder and vaping Reports tobacco use since the age of 67 Tobacco cessation counseled on at bedside.  COPD/asthma Continue bronchodilators Continue O2 supplementation to maintain O2 saturation greater than 90%  History of PE, noncompliant with Xarelto Continue Xarelto Advised on the importance of compliance  History of polysubstance abuse UDS positive for benzodiazepines and amphetamines which are prescribed  Alcohol level less than 10.  Protein S deficiency Continue DOAC  Chronic anxiety/depression/bipolar disorder Stable Continue home regimen  GERD Stable Continue PPI  Resolved post repletion: Hypokalemia Presented with serum potassium 3.3> 4.4 Serum magnesium 2.0.    DVT prophylaxis: Xarelto  Code Status: Full code as stated by the patient himself   Consults called: None  Admission status: Inpatient   Status is: Inpatient    Dispo: The patient is from: Home.   Anticipated d/c is to: Home.   Anticipated d/c date is: 10/20/2019   Patient currently not medically stable for discharge due  to ongoing treatment for  community-acquired pneumonia.    Objective: Vitals:   10/18/19 0512 10/18/19 0752 10/18/19 0753 10/18/19 0957  BP: 93/62     Pulse: 65 77    Resp: 20 14    Temp: 98.2 F (36.8 C)     TempSrc: Oral     SpO2: 94% 93% 93% 98%  Weight:      Height:        Intake/Output Summary (Last 24 hours) at 10/18/2019 1149 Last data filed at 10/18/2019 0513 Gross per 24 hour  Intake 2378.94 ml  Output 550 ml  Net 1828.94 ml   Filed Weights   10/17/19 0903 10/17/19 1350  Weight: 108.9 kg 108.9 kg    Exam:  . General: 59 y.o. year-old male well developed well nourished in no acute distress.  Alert and oriented x3. . Cardiovascular: Regular rate and rhythm with no rubs or gallops.  No thyromegaly or JVD noted.   Marland Kitchen Respiratory: Diffuse rales bilaterally.  No wheezing.  Good inspiratory effort. . Abdomen: Soft nontender nondistended with normal bowel sounds x4 quadrants. . Musculoskeletal: No lower extremity edema. 2/4 pulses in all 4 extremities. Marland Kitchen Psychiatry: Mood is appropriate for condition and setting   Data Reviewed: CBC: Recent Labs  Lab 10/17/19 0930 10/18/19 0540  WBC 11.0* 9.3  NEUTROABS 10.1* 8.0*  HGB 11.3* 9.8*  HCT 35.1* 31.1*  MCV 94.1 95.7  PLT 187 176   Basic Metabolic Panel: Recent Labs  Lab 10/17/19 0930 10/18/19 0540  NA 136 138  K 3.3* 4.4  CL 99 105  CO2 26 25  GLUCOSE 182* 111*  BUN 14 13  CREATININE 0.62 0.51*  CALCIUM 8.8* 8.5*  MG  --  2.0  PHOS  --  2.3*   GFR: Estimated Creatinine Clearance: 137.8 mL/min (A) (by C-G formula based on SCr of 0.51 mg/dL (L)). Liver Function Tests: No results for input(s): AST, ALT, ALKPHOS, BILITOT, PROT, ALBUMIN in the last 168 hours. No results for input(s): LIPASE, AMYLASE in the last 168 hours. No results for input(s): AMMONIA in the last 168 hours. Coagulation Profile: No results for input(s): INR, PROTIME in the last 168 hours. Cardiac Enzymes: No results for input(s): CKTOTAL, CKMB, CKMBINDEX,  TROPONINI in the last 168 hours. BNP (last 3 results) No results for input(s): PROBNP in the last 8760 hours. HbA1C: No results for input(s): HGBA1C in the last 72 hours. CBG: Recent Labs  Lab 10/17/19 1450  GLUCAP 174*   Lipid Profile: No results for input(s): CHOL, HDL, LDLCALC, TRIG, CHOLHDL, LDLDIRECT in the last 72 hours. Thyroid Function Tests: No results for input(s): TSH, T4TOTAL, FREET4, T3FREE, THYROIDAB in the last 72 hours. Anemia Panel: No results for input(s): VITAMINB12, FOLATE, FERRITIN, TIBC, IRON, RETICCTPCT in the last 72 hours. Urine analysis:    Component Value Date/Time   COLORURINE AMBER (A) 02/05/2015 1915   APPEARANCEUR CLEAR 02/05/2015 1915   LABSPEC 1.025 02/05/2015 1915   PHURINE 6.0 02/05/2015 1915   GLUCOSEU NEGATIVE 02/05/2015 1915   HGBUR MODERATE (A) 02/05/2015 1915   BILIRUBINUR SMALL (A) 02/05/2015 1915   KETONESUR 15 (A) 02/05/2015 1915   PROTEINUR 30 (A) 02/05/2015 1915   UROBILINOGEN 1.0 02/05/2015 1915   NITRITE NEGATIVE 02/05/2015 1915   LEUKOCYTESUR NEGATIVE 02/05/2015 1915   Sepsis Labs: @LABRCNTIP (procalcitonin:4,lacticidven:4)  ) Recent Results (from the past 240 hour(s))  SARS Coronavirus 2 by RT PCR (hospital order, performed in [facility] hospital lab) Nasopharyngeal Nasopharyngeal Swab     Status:  None   Collection Time: 10/17/19  9:46 AM   Specimen: Nasopharyngeal Swab  Result Value Ref Range Status   SARS Coronavirus 2 NEGATIVE NEGATIVE Final    Comment: (NOTE) SARS-CoV-2 target nucleic acids are NOT DETECTED.  The SARS-CoV-2 RNA is generally detectable in upper and lower respiratory specimens during the acute phase of infection. The lowest concentration of SARS-CoV-2 viral copies this assay can detect is 250 copies / mL. A negative result does not preclude SARS-CoV-2 infection and should not be used as the sole basis for treatment or other patient management decisions.  A negative result may occur with improper  specimen collection / handling, submission of specimen other than nasopharyngeal swab, presence of viral mutation(s) within the areas targeted by this assay, and inadequate number of viral copies (<250 copies / mL). A negative result must be combined with clinical observations, patient history, and epidemiological information.  Fact Sheet for Patients:   BoilerBrush.com.cy  Fact Sheet for Healthcare Providers: https://pope.com/  This test is not yet approved or  cleared by the [place] FDA and has been authorized for detection and/or diagnosis of SARS-CoV-2 by FDA under an Emergency Use Authorization (EUA).  This EUA will remain in effect (meaning this test can be used) for the duration of the COVID-19 declaration under Section 564(b)(1) of the Act, 21 U.S.C. section 360bbb-3(b)(1), unless the authorization is terminated or revoked sooner.  Performed at [facility], [address], [place], [place] [address]   Culture, blood (routine x 2)     Status: None (Preliminary result)   Collection Time: 10/17/19 10:30 AM   Specimen: BLOOD RIGHT HAND  Result Value Ref Range Status   Specimen Description BLOOD RIGHT HAND  Final   Special Requests   Final    BOTTLES DRAWN AEROBIC AND ANAEROBIC Blood Culture adequate volume   Culture   Final    NO GROWTH < 24 HOURS Performed at [facility], [address], [place], [place] [address]    Report Status PENDING  Incomplete  Culture, blood (routine x 2)     Status: None (Preliminary result)   Collection Time: 10/17/19 10:37 AM   Specimen: BLOOD LEFT HAND  Result Value Ref Range Status   Specimen Description BLOOD LEFT HAND  Final   Special Requests   Final    BOTTLES DRAWN AEROBIC AND ANAEROBIC Blood Culture adequate volume   Culture   Final    NO GROWTH < 24 HOURS Performed at [facility], [address], [place], [place] [address]    Report Status PENDING  Incomplete       Studies: CT ANGIO CHEST PE W OR WO CONTRAST  Result Date: 10/17/2019 CLINICAL DATA:  Hypoxemia, shortness of breath, right lung pneumonia by chest x-ray EXAM: CT ANGIOGRAPHY CHEST WITH CONTRAST TECHNIQUE: Multidetector CT imaging of the chest was performed using the standard protocol during bolus administration of intravenous contrast. Multiplanar CT image reconstructions and MIPs were obtained to evaluate the vascular anatomy. CONTRAST:  OMNIPAQUE IOHEXOL 350 MG/ML SOLN COMPARISON:  10/17/2019, 11/06/2013 FINDINGS: Cardiovascular: Intact thoracic aorta with 3 vessel arch anatomy. Negative for aneurysm or dissection. No mediastinal hemorrhage or hematoma. No significant filling defect or pulmonary embolus demonstrated by CTA. Normal heart size. No pericardial effusion. Mediastinum/Nodes: Diffuse mild mediastinal, subcarinal and bilateral hilar adenopathy. Right hilar lymph nodes have short axis measurements up to 1.5 cm, image 166 series 7. Suspect diffuse reactive adenopathy. Lungs/Pleura: Extensive patchy and nodular airspace/bronchovascular opacities in the right lung involving  all lobes compatible with diffuse right lung pneumonia. This correlates with the chest x-ray from earlier today. Minor left lung base atelectasis. No pleural effusion, pleural abnormality, or pneumothorax. Trachea central airways are patent. Upper Abdomen: Previous cholecystectomy. No acute upper abdominal finding. Musculoskeletal: Degenerative changes noted of the spine. No acute osseous finding. Intact sternum. Review of the MIP images confirms the above findings. IMPRESSION: Extensive diffuse right lung airspace opacities throughout all lobes compatible with acute right lung pneumonia. Associated mediastinal and hilar adenopathy, suspect reactive. Negative for significant acute pulmonary embolus by CTA. No acute vascular finding. Electronically Signed   By: Judie PetitM.  Shick M.D.   On: 10/17/2019 13:18   US Venous Img Lower  Bilateral (DVT)  Result Date: 10/17/2019 CLINICAL DATA:  Shortness of breath, positive D-dimer EXAM: BILATERAL LOWER EXTREMITY VENOUS DOPPLER ULTRASOUND TECHNIQUE: Gray-scale sonography with graded compression, as well as color Doppler and duplex ultrasound were performed to evaluate the lower extremity deep venous systems from the level of the common femoral vein and including the common femoral, femoral, profunda femoral, popliteal and calf veins including the posterior tibial, peroneal and gastrocnemius veins when visible. The superficial great saphenous vein was also interrogated. Spectral Doppler was utilized to evaluate flow at rest and with distal augmentation maneuvers in the common femoral, femoral and popliteal veins. COMPARISON:  None. FINDINGS: RIGHT LOWER EXTREMITY Common Femoral Vein: No evidence of thrombus. Normal compressibility, respiratory phasicity and response to augmentation. Saphenofemoral Junction: No evidence of thrombus. Normal compressibility and flow on color Doppler imaging. Profunda Femoral Vein: No evidence of thrombus. Normal compressibility and flow on color Doppler imaging. Femoral Vein: No evidence of thrombus. Normal compressibility, respiratory phasicity and response to augmentation. Popliteal Vein: No evidence of thrombus. Normal compressibility, respiratory phasicity and response to augmentation. Calf Veins: No evidence of thrombus. Normal compressibility and flow on color Doppler imaging. LEFT LOWER EXTREMITY Common Femoral Vein: No evidence of thrombus. Normal compressibility, respiratory phasicity and response to augmentation. Saphenofemoral Junction: No evidence of thrombus. Normal compressibility and flow on color Doppler imaging. Profunda Femoral Vein: No evidence of thrombus. Normal compressibility and flow on color Doppler imaging. Femoral Vein: No evidence of thrombus. Normal compressibility, respiratory phasicity and response to augmentation. Popliteal Vein: No  evidence of thrombus. Normal compressibility, respiratory phasicity and response to augmentation. Calf Veins: No evidence of thrombus. Normal compressibility and flow on color Doppler imaging. IMPRESSION: No evidence of deep venous thrombosis in either lower extremity. Electronically Signed   By: Judie PetitM.  Shick M.D.   On: 10/17/2019 13:33    Scheduled Meds: . ALPRAZolam  0.5 mg Oral BID  . atorvastatin  10 mg Oral Daily  . buprenorphine-naloxone  1 tablet Sublingual TID  . buPROPion  150 mg Oral Daily  . busPIRone  5 mg Oral BID  . feeding supplement (ENSURE ENLIVE)  237 mL Oral BID BM  . gabapentin  300 mg Oral TID  . guaiFENesin  1,200 mg Oral BID  . ipratropium-albuterol  3 mL Nebulization Q6H WA  . mouth rinse  15 mL Mouth Rinse BID  . mometasone-formoterol  2 puff Inhalation BID  . nicotine  21 mg Transdermal Daily  . pantoprazole  40 mg Oral Daily  . pneumococcal 23 valent vaccine  0.5 mL Intramuscular Tomorrow-1000  . QUEtiapine  100 mg Oral QHS  . rivaroxaban  20 mg Oral Daily  . venlafaxine XR  150 mg Oral Q breakfast    Continuous Infusions: . sodium chloride 75 mL/hr at 10/18/19 0513  .  azithromycin    . cefTRIAXone (ROCEPHIN)  IV 1 g (10/18/19 0904)     LOS: 1 day     Darlin Drop, MD [facility] Pager [phone]  If 7PM-7AM, please contact night-coverage [email or web address] Password [email or web address] 10/18/2019, 11:49 AM

## 2019-10-18 NOTE — Progress Notes (Signed)
Modified Barium Swallow Progress Note  Patient Details  Name: DEMARRIO MENGES MRN: [id number] Date of Birth: January 21, 1961  Today's Date: 10/18/2019  Modified Barium Swallow completed.  Full report located under Chart Review in the Imaging Section.  Brief recommendations include the following:  Clinical Impression  Pt presents with mild/mod pharyngeal phase dysphagia characterized by delay in swallow initiation with swallow trigger after spilling to the pyriforms with liquids and reduced epiglottic deflection and laryngeal vestibule closure resulting in trace penetration of thins and nectars before and during the swallow (greater amounts with thin via straw sips) which occasionally reached the vocal folds, but did not drop below. Pt with variable throat clearing when this occurred. Pt with reduced tongue base retraction with puree, however when cued for effortful swallow and "swallow fast and hard", great improvement noted. Although aspiration not observed over the course of the study, Pt is at risk for aspiration given consistent penetration without complete removal. Recommend regular textures and thin liquids via cup sip (no straws) and Pt to clear throat/cough periodically after thins and repeat swallow, effortful swallows and reflux precautions. This study was reviewed with the Pt and recommendations were provided in written format. Pt appreciative of recommendations. SLP will follow x1 in acute for carryover of education and strategies.    Swallow Evaluation Recommendations       SLP Diet Recommendations: Regular solids;Thin liquid   Liquid Administration via: Cup;No straw   Medication Administration: Whole meds with liquid   Supervision: Patient able to self feed   Compensations: Small sips/bites;Clear throat intermittently;Effortful swallow;Multiple dry swallows after each bite/sip   Postural Changes: Remain semi-upright after after feeds/meals (Comment);Seated upright at 90 degrees    Oral Care Recommendations: Oral care BID;Patient independent with oral care   Other Recommendations: Clarify dietary restrictions   Thank you,  Havery Moros, CCC-SLP [phone]  Taitum Alms 10/18/2019,6:31 PM

## 2019-10-18 NOTE — TOC Initial Note (Signed)
Transition of Care [facility]) - Initial/Assessment Note    Patient Details  Name: [name] MRN: [id number] Date of Birth: 05-May-1960  Transition of Care [facility]) CM/SW Contact:    Karn Cassis, LCSW Phone Number: 10/18/2019, 12:13 PM  Clinical Narrative:  Pt admitted due to CAP. LCSW spoke with pt to complete assessment due to high risk readmission score. Pt reports he lives with his mother. He is fairly independent with ADLs at baseline. Pt reports no home health or DMR. He plans to return home when medically stable. Pt is followed by Dr. Melvenia Beam, psychiatrist in [place] and also sees therapist every other week. LCSW made hospital follow up appointment at [facility] for 10/27/19 at 2:45. Appointment entered on AVS.                  Expected Discharge Plan: Home/Self Care Barriers to Discharge: Continued Medical Work up   Patient Goals and CMS Choice Patient states their goals for this hospitalization and ongoing recovery are:: return home      Expected Discharge Plan and Services Expected Discharge Plan: Home/Self Care In-house Referral: Clinical Social Work Discharge Planning Services: Follow-up appt scheduled   Living arrangements for the past 2 months: Single Family Home                                      Prior Living Arrangements/Services Living arrangements for the past 2 months: Single Family Home Lives with:: Parents Patient language and need for interpreter reviewed:: Yes Do you feel safe going back to the place where you live?: Yes      Need for Family Participation in Patient Care: No (Comment) Care giver support system in place?: No (comment)   Criminal Activity/Legal Involvement Pertinent to Current Situation/Hospitalization: No - Comment as needed  Activities of Daily Living Home Assistive Devices/Equipment: None ADL Screening (condition at time of admission) Patient's cognitive ability adequate to safely complete daily activities?: Yes Is  the patient deaf or have difficulty hearing?: No Does the patient have difficulty seeing, even when wearing glasses/contacts?: No Does the patient have difficulty concentrating, remembering, or making decisions?: No Patient able to express need for assistance with ADLs?: Yes Does the patient have difficulty dressing or bathing?: No Independently performs ADLs?: Yes (appropriate for developmental age) Does the patient have difficulty walking or climbing stairs?: No Weakness of Legs: None Weakness of Arms/Hands: None  Permission Sought/Granted                  Emotional Assessment   Attitude/Demeanor/Rapport: Engaged Affect (typically observed): Appropriate Orientation: : Oriented to Self, Oriented to Place, Oriented to  Time, Oriented to Situation   Psych Involvement: Outpatient Provider  Admission diagnosis:  Swelling [R60.9] Pain [R52] COPD exacerbation (HCC) [J44.1] CAP (community acquired pneumonia) [J18.9] Acute respiratory failure with hypoxia (HCC) [J96.01] Elevated d-dimer [R79.89] Community acquired pneumonia of right middle lobe of lung [J18.9] Current every day nicotine vaping [Z72.0] COVID-19 virus not detected [Z03.818] Patient Active Problem List   Diagnosis Date Noted  . CAP (community acquired pneumonia) 10/17/2019  . Recurrent incisional hernia   . Incisional hernia, without obstruction or gangrene   . Acute respiratory failure with hypoxia (HCC)   . COPD exacerbation (HCC) 02/07/2015  . Community acquired pneumonia 02/05/2015  . Polysubstance dependence including opioid type drug, episodic abuse (HCC) 02/05/2015  . Healthcare-associated pneumonia 08/17/2013  . Osteoarthritis 08/17/2013  .  GERD (gastroesophageal reflux disease) 08/17/2013  . Hx of pulmonary embolus 08/17/2013  . 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 COPD (chronic obstructive pulmonary disease) (HCC) 08/17/2013  . Anxiety 08/17/2013  . Hypokalemia 08/17/2013    PCP:  Pearson Grippe, MD Pharmacy:   [facility] - [place], [place] - [phone] [place] [address] Phone: [phone] Fax: [phone]  [facility] - [place], [place] - [address] [place] [address] Phone: [phone] Fax: [phone]     Social Determinants of Health (SDOH) Interventions    Readmission Risk Interventions Readmission Risk Prevention Plan 10/18/2019  Transportation Screening Complete  PCP or Specialist Appt within 3-5 Days Complete  HRI or Home Care Consult Complete  Social Work Consult for Recovery Care Planning/Counseling Complete  Palliative Care Screening Not Applicable  Medication Review Oceanographer) Complete  Some recent data might be hidden

## 2019-10-19 DIAGNOSIS — J9601 Acute respiratory failure with hypoxia: Secondary | ICD-10-CM

## 2019-10-19 LAB — BASIC METABOLIC PANEL
Anion gap: 8 (ref 5–15)
BUN: 16 mg/dL (ref 6–20)
CO2: 25 mmol/L (ref 22–32)
Calcium: 8.5 mg/dL — ABNORMAL LOW (ref 8.9–10.3)
Chloride: 106 mmol/L (ref 98–111)
Creatinine, Ser: 0.67 mg/dL (ref 0.61–1.24)
GFR calc Af Amer: >60 mL/min (ref >60)
GFR calc non Af Amer: >60 mL/min (ref >60)
Glucose, Bld: 94 mg/dL (ref 70–99)
Potassium: 3.8 mmol/L (ref 3.5–5.1)
Sodium: 139 mmol/L (ref 135–145)

## 2019-10-19 LAB — CBC
HCT: 31.8 % — ABNORMAL LOW (ref 39.0–52.0)
Hemoglobin: 9.9 g/dL — ABNORMAL LOW (ref 13.0–17.0)
MCH: 29.9 pg (ref 26.0–34.0)
MCHC: 31.1 g/dL (ref 30.0–36.0)
MCV: 96.1 fL (ref 80.0–100.0)
Platelets: 192 10*3/uL (ref 150–400)
RBC: 3.31 MIL/uL — ABNORMAL LOW (ref 4.22–5.81)
RDW: 14.6 % (ref 11.5–15.5)
WBC: 7.2 10*3/uL (ref 4.0–10.5)
nRBC: 0 % (ref 0.0–0.2)

## 2019-10-19 MED ORDER — DOXYCYCLINE HYCLATE 100 MG PO CAPS
100.0000 mg | ORAL_CAPSULE | Freq: Two times a day (BID) | ORAL | 0 refills | Status: AC
Start: 2019-10-19 — End: 2019-10-24

## 2019-10-19 MED ORDER — GUAIFENESIN-DM 100-10 MG/5ML PO SYRP: 5.0000 mL | ORAL_SOLUTION | ORAL | 0 refills | Status: DC | PRN

## 2019-10-19 MED ORDER — POLYETHYLENE GLYCOL 3350 17 G PO PACK
17.0000 g | PACK | Freq: Every day | ORAL | Status: DC
  Administered 2019-10-19: 17 g via ORAL
  Filled 2019-10-19: qty 1

## 2019-10-19 MED ORDER — GUAIFENESIN-DM 100-10 MG/5ML PO SYRP
5.0000 mL | ORAL_SOLUTION | ORAL | Status: DC | PRN
  Administered 2019-10-19: 5 mL via ORAL
  Filled 2019-10-19: qty 5

## 2019-10-19 NOTE — TOC Transition Note (Signed)
Transition of Care [facility]) - CM/SW Discharge Note   Patient Details  Name: [name] MRN: [id number] Date of Birth: [birth year]  [facility]) CM/SW Contact:  Elliot Gault, LCSW Phone Number: 10/19/2019, 11:31 AM   Clinical Narrative:     Pt stable for dc today per MD. TOC needs addressed. Will sign off.  Final next level of care: Home/Self Care Barriers to Discharge: Barriers Resolved   Patient Goals and CMS Choice Patient states their goals for this hospitalization and ongoing recovery are:: return home      Discharge Placement                       Discharge Plan and Services In-house Referral: Clinical Social Work Discharge Planning Services: Follow-up appt scheduled                                 Social Determinants of Health (SDOH) Interventions     Readmission Risk Interventions Readmission Risk Prevention Plan 10/18/2019  Transportation Screening Complete  PCP or Specialist Appt within 3-5 Days Complete  HRI or Home Care Consult Complete  Social Work Consult for Recovery Care Planning/Counseling Complete  Palliative Care Screening Not Applicable  Medication Review Oceanographer) Complete  Some recent data might be hidden

## 2019-10-19 NOTE — Progress Notes (Signed)
Nsg Discharge Note  Admit Date:  10/17/2019 Discharge date: 10/19/2019   [name] to be D/C'd Home per MD order.  AVS completed.  Copy for chart, and copy for patient signed, and dated. Patient/caregiver able to verbalize understanding.  Discharge Medication: Allergies as of 10/19/2019   No Known Allergies     Medication List    STOP taking these medications   buPROPion 150 MG 24 hr tablet Commonly known as: WELLBUTRIN XL   enoxaparin 150 MG/ML injection Commonly known as: LOVENOX     TAKE these medications   albuterol 108 (90 Base) MCG/ACT inhaler Commonly known as: VENTOLIN HFA Inhale 2 puffs into the lungs every 4 (four) hours as needed for wheezing or shortness of breath.   ALPRAZolam 0.5 MG tablet Commonly known as: XANAX Take 0.5 mg by mouth 2 (two) times daily. What changed: Another medication with the same name was removed. Continue taking this medication, and follow the directions you see here.   amphetamine-dextroamphetamine 20 MG tablet Commonly known as: ADDERALL Take 20 mg by mouth 2 (two) times daily.   atorvastatin 10 MG tablet Commonly known as: LIPITOR Take 10 mg by mouth daily.   budesonide-formoterol 80-4.5 MCG/ACT inhaler Commonly known as: SYMBICORT Inhale 2 puffs into the lungs 2 (two) times daily.   busPIRone 5 MG tablet Commonly known as: BUSPAR Take 5 mg by mouth 2 (two) times daily.   doxycycline 100 MG capsule Commonly known as: VIBRAMYCIN Take 1 capsule (100 mg total) by mouth 2 (two) times daily for 5 days.   gabapentin 300 MG capsule Commonly known as: Neurontin Take 1 capsule (300 mg total) by mouth 3 (three) times daily.   guaiFENesin-dextromethorphan 100-10 MG/5ML syrup Commonly known as: ROBITUSSIN DM Take 5 mLs by mouth every 4 (four) hours as needed for cough.   meclizine 12.5 MG tablet Commonly known as: ANTIVERT Take 12.5 mg by mouth 3 (three) times daily as needed for dizziness.   Milk Thistle 1000 MG Caps Take  1,000 mg by mouth daily.   omeprazole 40 MG capsule Commonly known as: PRILOSEC Take 40 mg by mouth daily.   oxyCODONE-acetaminophen 7.5-325 MG tablet Commonly known as: Percocet Take 1 tablet by mouth every 4 (four) hours as needed for severe pain.   promethazine 25 MG tablet Commonly known as: PHENERGAN Take 25 mg by mouth every 6 (six) hours as needed for nausea or vomiting.   QUEtiapine 100 MG tablet Commonly known as: SEROQUEL Take 100 mg by mouth at bedtime.   Suboxone 8-2 MG Film Generic drug: Buprenorphine HCl-Naloxone HCl Take 1 Film by mouth in the morning, at noon, and at bedtime.   triamcinolone cream 0.5 % Commonly known as: KENALOG Apply 1 application topically 2 (two) times daily as needed.   venlafaxine XR 150 MG 24 hr capsule Commonly known as: EFFEXOR-XR Take 150 mg by mouth daily with breakfast. What changed: Another medication with the same name was removed. Continue taking this medication, and follow the directions you see here.   Xarelto 20 MG Tabs tablet Generic drug: rivaroxaban Take 20 mg by mouth daily.       Discharge Assessment: Vitals:   10/19/19 0756 10/19/19 0803  BP:    Pulse:    Resp:    Temp:    SpO2: 94% 100%   Skin clean, dry and intact without evidence of skin break down, no evidence of skin tears noted. IV catheter discontinued intact. Site without signs and symptoms of complications -  no redness or edema noted at insertion site, patient denies c/o pain - only slight tenderness at site.  Dressing with slight pressure applied.  D/c Instructions-Education: Discharge instructions given to patient/family with verbalized understanding. D/c education completed with patient/family including follow up instructions, medication list, d/c activities limitations if indicated, with other d/c instructions as indicated by MD - patient able to verbalize understanding, all questions fully answered. Patient instructed to return to ED, call 911,  or call MD for any changes in condition.  Patient escorted via WC, and D/C home via private auto.  Brandy Hale, LPN 04/19/6151 7:94 PM

## 2019-10-19 NOTE — Discharge Summary (Addendum)
Physician Discharge Summary  Eric FasterGary W Stewart ZOX:[id number] DOB: 1961/01/08 DOA: 10/17/2019  PCP: Pearson GrippeKim, James, MD  Admit date: 10/17/2019 Discharge date: 10/19/2019  Admitted From:  Home  Disposition: Home   Recommendations for Outpatient Follow-up:  Follow up with PCP in 1-2 weeks  Discharge Condition: STABLE   CODE STATUS: FULL    Brief Hospitalization Summary: Please see all hospital notes, images, labs for full details of the hospitalization. ADMISSION HPI: [name] is a 59 y.o. male with medical history significant for PE on Xarelto, protein S deficiency, asthma, COPD, chronic anxiety/depression/bipolar disorder, tobacco use disorder who presented to [facility] ED with complaints of right-sided chest pain, pleuritic, worse with taking a deep breath, of 4 days duration.  Associated with a productive cough, dyspnea with minimal exertion, night sweats and chills.  Reports noncompliance with Xarelto, forgetting to take it sometimes.  Admits to vaping 7-8 times per day.  Ongoing tobacco use since the age of 59.  No significant weight loss.  Denies any recent lengthy trips, exposure to tuberculosis or traveling outside of the [place].  He presented to the ED for further evaluation and management.   ED Course: Upon presentation to the ED, hypoxic with O2 saturation in the mid 80s on room air.  Chest x-ray suggestive of right middle lobe pneumonia.  Denies any use of alcohol or passing out.  Started on empiric IV antibiotics for community-acquired pneumonia in the ED.  TRH asked to admit.    Community-acquired pneumonia, POA Presented with right-sided pleuritic chest pain, productive cough, night sweats and chills Personally reviewed admission chest x-ray which shows right middle lobe infiltrates Also reviewed CT scan done on 10/17/2019 which showed significant infiltrates involving the right upper and right middle lobe. History of vaping 7-8 times per day, counseled on cessation. Sputum  culture pending He was treated with empiric IV antibiotics Rocephin and azithromycin Blood cultures negative to date  Nicotine dependence with withdrawal   Acute hypoxic respiratory failure secondary to community-acquired pneumonia versus others Admits to not always be compliant with Xarelto, forgetting to take it at times CTA chest negative for PE  Not on oxygen supplementation at baseline Presented hypoxic with O2 saturation in the 80s on room air Continue to maintain O2 saturation greater than 90% He is on room air now and having no SOB and pulse ox 93% or higher  Possible history of aspiration, self-reported Speech therapy to assess He was evaluated by SLP see notes, regular, thin recommended   Right lower extremity swelling and tenderness Elevated D-dimer greater than 2.0 Doppler ultrasound negative for DVT   Tobacco use disorder and vaping Reports tobacco use since the age of 59 Tobacco cessation counseled on at bedside.   COPD/asthma Continue bronchodilators Continue O2 supplementation to maintain O2 saturation greater than 90%   History of PE, noncompliant with Xarelto Continue Xarelto Advised on the importance of compliance    History of polysubstance abuse UDS positive for benzodiazepines and amphetamines which are prescribed  Alcohol level less than 10.   Protein S deficiency Continue DOAC   Chronic anxiety/depression/bipolar disorder Stable Continue home regimen   GERD Stable Continue PPI   Resolved post repletion: Hypokalemia Presented with serum potassium 3.3> 4.4 Serum magnesium 2.0.     DVT prophylaxis: Xarelto   Code Status: Full code as stated by the patient himself  Discharge Diagnoses:  Principal Problem:   CAP (community acquired pneumonia) Active Problems:   Acute respiratory failure with hypoxia (HCC)  Discharge Instructions:   Allergies as of 10/19/2019   No Known Allergies      Medication List     STOP taking these  medications    buPROPion 150 MG 24 hr tablet Commonly known as: WELLBUTRIN XL   enoxaparin 150 MG/ML injection Commonly known as: LOVENOX       TAKE these medications    albuterol 108 (90 Base) MCG/ACT inhaler Commonly known as: VENTOLIN HFA Inhale 2 puffs into the lungs every 4 (four) hours as needed for wheezing or shortness of breath.   ALPRAZolam 0.5 MG tablet Commonly known as: XANAX Take 0.5 mg by mouth 2 (two) times daily. What changed: Another medication with the same name was removed. Continue taking this medication, and follow the directions you see here.   amphetamine-dextroamphetamine 20 MG tablet Commonly known as: ADDERALL Take 20 mg by mouth 2 (two) times daily.   atorvastatin 10 MG tablet Commonly known as: LIPITOR Take 10 mg by mouth daily.   budesonide-formoterol 80-4.5 MCG/ACT inhaler Commonly known as: SYMBICORT Inhale 2 puffs into the lungs 2 (two) times daily.   busPIRone 5 MG tablet Commonly known as: BUSPAR Take 5 mg by mouth 2 (two) times daily.   doxycycline 100 MG capsule Commonly known as: VIBRAMYCIN Take 1 capsule (100 mg total) by mouth 2 (two) times daily for 5 days.   gabapentin 300 MG capsule Commonly known as: Neurontin Take 1 capsule (300 mg total) by mouth 3 (three) times daily.   guaiFENesin-dextromethorphan 100-10 MG/5ML syrup Commonly known as: ROBITUSSIN DM Take 5 mLs by mouth every 4 (four) hours as needed for cough.   meclizine 12.5 MG tablet Commonly known as: ANTIVERT Take 12.5 mg by mouth 3 (three) times daily as needed for dizziness.   Milk Thistle 1000 MG Caps Take 1,000 mg by mouth daily.   omeprazole 40 MG capsule Commonly known as: PRILOSEC Take 40 mg by mouth daily.   oxyCODONE-acetaminophen 7.5-325 MG tablet Commonly known as: Percocet Take 1 tablet by mouth every 4 (four) hours as needed for severe pain.   promethazine 25 MG tablet Commonly known as: PHENERGAN Take 25 mg by mouth every 6 (six)  hours as needed for nausea or vomiting.   QUEtiapine 100 MG tablet Commonly known as: SEROQUEL Take 100 mg by mouth at bedtime.   Suboxone 8-2 MG Film Generic drug: Buprenorphine HCl-Naloxone HCl Take 1 Film by mouth in the morning, at noon, and at bedtime.   triamcinolone cream 0.5 % Commonly known as: KENALOG Apply 1 application topically 2 (two) times daily as needed.   venlafaxine XR 150 MG 24 hr capsule Commonly known as: EFFEXOR-XR Take 150 mg by mouth daily with breakfast. What changed: Another medication with the same name was removed. Continue taking this medication, and follow the directions you see here.   Xarelto 20 MG Tabs tablet Generic drug: rivaroxaban Take 20 mg by mouth daily.        Follow-up Information     [facility], [facility]. Go on 10/27/2019.   Why: 2:45 Contact information: [address] [place] [address] [phone]                No Known Allergies Allergies as of 10/19/2019   No Known Allergies      Medication List     STOP taking these medications    buPROPion 150 MG 24 hr tablet Commonly known as: WELLBUTRIN XL   enoxaparin 150 MG/ML injection Commonly known as: LOVENOX  TAKE these medications    albuterol 108 (90 Base) MCG/ACT inhaler Commonly known as: VENTOLIN HFA Inhale 2 puffs into the lungs every 4 (four) hours as needed for wheezing or shortness of breath.   ALPRAZolam 0.5 MG tablet Commonly known as: XANAX Take 0.5 mg by mouth 2 (two) times daily. What changed: Another medication with the same name was removed. Continue taking this medication, and follow the directions you see here.   amphetamine-dextroamphetamine 20 MG tablet Commonly known as: ADDERALL Take 20 mg by mouth 2 (two) times daily.   atorvastatin 10 MG tablet Commonly known as: LIPITOR Take 10 mg by mouth daily.   budesonide-formoterol 80-4.5 MCG/ACT inhaler Commonly known as: SYMBICORT Inhale 2 puffs into the lungs 2  (two) times daily.   busPIRone 5 MG tablet Commonly known as: BUSPAR Take 5 mg by mouth 2 (two) times daily.   doxycycline 100 MG capsule Commonly known as: VIBRAMYCIN Take 1 capsule (100 mg total) by mouth 2 (two) times daily for 5 days.   gabapentin 300 MG capsule Commonly known as: Neurontin Take 1 capsule (300 mg total) by mouth 3 (three) times daily.   guaiFENesin-dextromethorphan 100-10 MG/5ML syrup Commonly known as: ROBITUSSIN DM Take 5 mLs by mouth every 4 (four) hours as needed for cough.   meclizine 12.5 MG tablet Commonly known as: ANTIVERT Take 12.5 mg by mouth 3 (three) times daily as needed for dizziness.   Milk Thistle 1000 MG Caps Take 1,000 mg by mouth daily.   omeprazole 40 MG capsule Commonly known as: PRILOSEC Take 40 mg by mouth daily.   oxyCODONE-acetaminophen 7.5-325 MG tablet Commonly known as: Percocet Take 1 tablet by mouth every 4 (four) hours as needed for severe pain.   promethazine 25 MG tablet Commonly known as: PHENERGAN Take 25 mg by mouth every 6 (six) hours as needed for nausea or vomiting.   QUEtiapine 100 MG tablet Commonly known as: SEROQUEL Take 100 mg by mouth at bedtime.   Suboxone 8-2 MG Film Generic drug: Buprenorphine HCl-Naloxone HCl Take 1 Film by mouth in the morning, at noon, and at bedtime.   triamcinolone cream 0.5 % Commonly known as: KENALOG Apply 1 application topically 2 (two) times daily as needed.   venlafaxine XR 150 MG 24 hr capsule Commonly known as: EFFEXOR-XR Take 150 mg by mouth daily with breakfast. What changed: Another medication with the same name was removed. Continue taking this medication, and follow the directions you see here.   Xarelto 20 MG Tabs tablet Generic drug: rivaroxaban Take 20 mg by mouth daily.        Procedures/Studies: CT ANGIO CHEST PE W OR WO CONTRAST  Result Date: 10/17/2019 CLINICAL DATA:  Hypoxemia, shortness of breath, right lung pneumonia by chest x-ray EXAM: CT  ANGIOGRAPHY CHEST WITH CONTRAST TECHNIQUE: Multidetector CT imaging of the chest was performed using the standard protocol during bolus administration of intravenous contrast. Multiplanar CT image reconstructions and MIPs were obtained to evaluate the vascular anatomy. CONTRAST:  OMNIPAQUE IOHEXOL 350 MG/ML SOLN COMPARISON:  10/17/2019, 11/06/2013 FINDINGS: Cardiovascular: Intact thoracic aorta with 3 vessel arch anatomy. Negative for aneurysm or dissection. No mediastinal hemorrhage or hematoma. No significant filling defect or pulmonary embolus demonstrated by CTA. Normal heart size. No pericardial effusion. Mediastinum/Nodes: Diffuse mild mediastinal, subcarinal and bilateral hilar adenopathy. Right hilar lymph nodes have short axis measurements up to 1.5 cm, image 166 series 7. Suspect diffuse reactive adenopathy. Lungs/Pleura: Extensive patchy and nodular airspace/bronchovascular opacities in the right  lung involving all lobes compatible with diffuse right lung pneumonia. This correlates with the chest x-ray from earlier today. Minor left lung base atelectasis. No pleural effusion, pleural abnormality, or pneumothorax. Trachea central airways are patent. Upper Abdomen: Previous cholecystectomy. No acute upper abdominal finding. Musculoskeletal: Degenerative changes noted of the spine. No acute osseous finding. Intact sternum. Review of the MIP images confirms the above findings. IMPRESSION: Extensive diffuse right lung airspace opacities throughout all lobes compatible with acute right lung pneumonia. Associated mediastinal and hilar adenopathy, suspect reactive. Negative for significant acute pulmonary embolus by CTA. No acute vascular finding. Electronically Signed   By: Judie Petit.  Shick M.D.   On: 10/17/2019 13:18   US Venous Img Lower Bilateral (DVT)  Result Date: 10/17/2019 CLINICAL DATA:  Shortness of breath, positive D-dimer EXAM: BILATERAL LOWER EXTREMITY VENOUS DOPPLER ULTRASOUND TECHNIQUE:  Gray-scale sonography with graded compression, as well as color Doppler and duplex ultrasound were performed to evaluate the lower extremity deep venous systems from the level of the common femoral vein and including the common femoral, femoral, profunda femoral, popliteal and calf veins including the posterior tibial, peroneal and gastrocnemius veins when visible. The superficial great saphenous vein was also interrogated. Spectral Doppler was utilized to evaluate flow at rest and with distal augmentation maneuvers in the common femoral, femoral and popliteal veins. COMPARISON:  None. FINDINGS: RIGHT LOWER EXTREMITY Common Femoral Vein: No evidence of thrombus. Normal compressibility, respiratory phasicity and response to augmentation. Saphenofemoral Junction: No evidence of thrombus. Normal compressibility and flow on color Doppler imaging. Profunda Femoral Vein: No evidence of thrombus. Normal compressibility and flow on color Doppler imaging. Femoral Vein: No evidence of thrombus. Normal compressibility, respiratory phasicity and response to augmentation. Popliteal Vein: No evidence of thrombus. Normal compressibility, respiratory phasicity and response to augmentation. Calf Veins: No evidence of thrombus. Normal compressibility and flow on color Doppler imaging. LEFT LOWER EXTREMITY Common Femoral Vein: No evidence of thrombus. Normal compressibility, respiratory phasicity and response to augmentation. Saphenofemoral Junction: No evidence of thrombus. Normal compressibility and flow on color Doppler imaging. Profunda Femoral Vein: No evidence of thrombus. Normal compressibility and flow on color Doppler imaging. Femoral Vein: No evidence of thrombus. Normal compressibility, respiratory phasicity and response to augmentation. Popliteal Vein: No evidence of thrombus. Normal compressibility, respiratory phasicity and response to augmentation. Calf Veins: No evidence of thrombus. Normal compressibility and flow on  color Doppler imaging. IMPRESSION: No evidence of deep venous thrombosis in either lower extremity. Electronically Signed   By: Judie Petit.  Shick M.D.   On: 10/17/2019 13:33   DG Chest Port 1 View  Result Date: 10/17/2019 CLINICAL DATA:  Right chest pain, shortness of breath, chills and fever EXAM: PORTABLE CHEST 1 VIEW COMPARISON:  03/02/2018 FINDINGS: New right midlung airspace opacities compatible with pneumonia. Left lung remains clear. No effusion, edema pattern or pneumothorax. Normal heart size. Trachea midline. IMPRESSION: Acute right midlung airspace process compatible with pneumonia Electronically Signed   By: Judie Petit.  Shick M.D.   On: 10/17/2019 10:34   DG Swallowing Func-Speech Pathology  Result Date: 10/18/2019 Objective Swallowing Evaluation: Type of Study: MBS-Modified Barium Swallow Study  Patient Details Name: [name] MRN: [id number] Date of Birth: 06-14-1960 Today's Date: 10/18/2019 Time: SLP Start Time (ACUTE ONLY): 1352 -SLP Stop Time (ACUTE ONLY): 1422 SLP Time Calculation (min) (ACUTE ONLY): 30 min Past Medical History: Past Medical History: Diagnosis Date  Anxiety   Arthritis   Asthma   Bipolar disorder (HCC)   Chronic pain   COPD (chronic  obstructive pulmonary disease) (HCC)   Degenerative disk disease   GERD (gastroesophageal reflux disease)   Hiatal hernia   History of pneumonia   PE (pulmonary embolism) 2012  Protein S deficiency [facility])  Past Surgical History: Past Surgical History: Procedure Laterality Date  CHOLECYSTECTOMY    CLOSED REDUCTION MANDIBLE WITH MANDIBULOMA  10/21/2011  Procedure: CLOSED REDUCTION MANDIBLE WITH MANDIBULOMAXILLARY FUSION;  Surgeon: Darletta Moll, MD;  Location: [facility] OR;  Service: ENT;  Laterality: N/A;  MMF screws  HAND SURGERY Right 20 years ago  INCISIONAL HERNIA REPAIR N/A 10/27/2018  Procedure: HERNIA REPAIR INCISIONAL WITH MESH;  Surgeon: Franky Macho, MD;  Location: [facility];  Service: General;  Laterality: N/A;  INCISIONAL HERNIA REPAIR N/A 02/23/2019  Procedure:  LAPAROSCOPIC INCISIONAL HERNIA REPAIR WITH MESH;  Surgeon: Franky Macho, MD;  Location: [facility];  Service: General;  Laterality: N/A;  LEG SURGERY    MANDIBULAR HARDWARE REMOVAL  11/11/2011  Procedure: MANDIBULAR HARDWARE REMOVAL;  Surgeon: Darletta Moll, MD;  Location: [facility];  Service: ENT;  Laterality: N/A;  ORIF MANDIBULAR FRACTURE  10/21/2011  Procedure: OPEN REDUCTION INTERNAL FIXATION (ORIF) MANDIBULAR FRACTURE;  Surgeon: Darletta Moll, MD;  Location: [facility] OR;  Service: ENT;  Laterality: N/A;  UMBILICAL HERNIA REPAIR   HPI: [name] is a 59 y.o. male with medical history significant for PE on Xarelto, protein S deficiency, asthma, COPD, chronic anxiety/depression/bipolar disorder, tobacco use disorder who presented to [facility] ED with complaints of right-sided chest pain, pleuritic, worse with taking a deep breath, of 4 days duration.  Associated with a productive cough, dyspnea with minimal exertion, night sweats and chills.  Reports noncompliance with Xarelto, forgetting to take it sometimes.  Admits to vaping 7-8 times per day.  Ongoing tobacco use since the age of 86.  No significant weight loss.  Denies any recent lengthy trips, exposure to tuberculosis or traveling outside of the [place].  He presented to the ED for further evaluation and management. Upon presentation to the ED, hypoxic with O2 saturation in the mid 80s on room air.  Chest x-ray suggestive of right middle lobe pneumonia.  Denies any use of alcohol or passing out.  Started on empiric IV antibiotics for community-acquired pneumonia in the ED. Pt reports that he has been vaping for about a year. He had MBSS 01/24/2010.  Subjective: "I have been vaping for about a year." Assessment / Plan / Recommendation CHL IP CLINICAL IMPRESSIONS 10/18/2019 Clinical Impression Pt presents with mild/mod pharyngeal phase dysphagia characterized by delay in swallow initiation with swallow trigger after spilling to the pyriforms with  liquids and reduced epiglottic deflection and laryngeal vestibule closure resulting in trace penetration of thins and nectars before and during the swallow (greater amounts with thin via straw sips) which occasionally reached the vocal folds, but did not drop below. Pt with variable throat clearing when this occurred. Pt with reduced tongue base retraction with puree, however when cued for effortful swallow and "swallow fast and hard", great improvement noted. Although aspiration not observed over the course of the study, Pt is at risk for aspiration given consistent penetration without complete removal. Recommend regular textures and thin liquids via cup sip (no straws) and Pt to clear throat/cough periodically after thins and repeat swallow, effortful swallows and reflux precautions. This study was reviewed with the Pt and recommendations were provided in written format. Pt appreciative of recommendations. SLP will follow x1 in acute for carryover of education and strategies.  SLP Visit Diagnosis Dysphagia, oropharyngeal phase (R13.12) Attention and concentration deficit following -- Frontal lobe and executive function deficit following -- Impact on safety and function Mild aspiration risk   CHL IP TREATMENT RECOMMENDATION 10/18/2019 Treatment Recommendations Therapy as outlined in treatment plan below   Prognosis 10/18/2019 Prognosis for Safe Diet Advancement Good Barriers to Reach Goals -- Barriers/Prognosis Comment -- CHL IP DIET RECOMMENDATION 10/18/2019 SLP Diet Recommendations Regular solids;Thin liquid Liquid Administration via Cup;No straw Medication Administration Whole meds with liquid Compensations Small sips/bites;Clear throat intermittently;Effortful swallow;Multiple dry swallows after each bite/sip Postural Changes Remain semi-upright after after feeds/meals (Comment);Seated upright at 90 degrees   CHL IP OTHER RECOMMENDATIONS 10/18/2019 Recommended Consults -- Oral Care Recommendations Oral care BID;Patient  independent with oral care Other Recommendations Clarify dietary restrictions   CHL IP FOLLOW UP RECOMMENDATIONS 10/18/2019 Follow up Recommendations None   CHL IP FREQUENCY AND DURATION 10/18/2019 Speech Therapy Frequency (ACUTE ONLY) min 2x/week Treatment Duration 1 week      CHL IP ORAL PHASE 10/18/2019 Oral Phase WFL Oral - Pudding Teaspoon -- Oral - Pudding Cup -- Oral - Honey Teaspoon -- Oral - Honey Cup -- Oral - Nectar Teaspoon -- Oral - Nectar Cup -- Oral - Nectar Straw -- Oral - Thin Teaspoon -- Oral - Thin Cup -- Oral - Thin Straw -- Oral - Puree -- Oral - Mech Soft -- Oral - Regular -- Oral - Multi-Consistency -- Oral - Pill -- Oral Phase - Comment --  CHL IP PHARYNGEAL PHASE 10/18/2019 Pharyngeal Phase Impaired Pharyngeal- Pudding Teaspoon -- Pharyngeal -- Pharyngeal- Pudding Cup -- Pharyngeal -- Pharyngeal- Honey Teaspoon -- Pharyngeal -- Pharyngeal- Honey Cup -- Pharyngeal -- Pharyngeal- Nectar Teaspoon -- Pharyngeal -- Pharyngeal- Nectar Cup Delayed swallow initiation-pyriform sinuses;Reduced epiglottic inversion;Reduced airway/laryngeal closure;Penetration/Aspiration during swallow;Pharyngeal residue - valleculae;Pharyngeal residue - pyriform Pharyngeal Material enters airway, CONTACTS cords and then ejected out;Material enters airway, remains ABOVE vocal cords and not ejected out Pharyngeal- Nectar Straw -- Pharyngeal -- Pharyngeal- Thin Teaspoon Delayed swallow initiation-pyriform sinuses;Reduced epiglottic inversion;Penetration/Aspiration during swallow;Pharyngeal residue - valleculae;Pharyngeal residue - pyriform Pharyngeal Material does not enter airway;Material enters airway, remains ABOVE vocal cords then ejected out Pharyngeal- Thin Cup Delayed swallow initiation-pyriform sinuses;Delayed swallow initiation-vallecula;Reduced epiglottic inversion;Reduced airway/laryngeal closure;Penetration/Aspiration before swallow;Penetration/Aspiration during swallow;Pharyngeal residue - valleculae;Pharyngeal  residue - pyriform Pharyngeal Material enters airway, CONTACTS cords and not ejected out;Material enters airway, CONTACTS cords and then ejected out;Material enters airway, remains ABOVE vocal cords and not ejected out Pharyngeal- Thin Straw Delayed swallow initiation-pyriform sinuses;Reduced epiglottic inversion;Reduced airway/laryngeal closure;Penetration/Aspiration during swallow;Pharyngeal residue - valleculae;Pharyngeal residue - pyriform Pharyngeal -- Pharyngeal- Puree Reduced tongue base retraction;Pharyngeal residue - valleculae Pharyngeal -- Pharyngeal- Mechanical Soft -- Pharyngeal -- Pharyngeal- Regular WFL Pharyngeal -- Pharyngeal- Multi-consistency -- Pharyngeal -- Pharyngeal- Pill (No Data) Pharyngeal -- Pharyngeal Comment pharyngeal transit is bilateral when turned AP  CHL IP CERVICAL ESOPHAGEAL PHASE 10/18/2019 Cervical Esophageal Phase WFL Pudding Teaspoon -- Pudding Cup -- Honey Teaspoon -- Honey Cup -- Nectar Teaspoon -- Nectar Cup -- Nectar Straw -- Thin Teaspoon -- Thin Cup -- Thin Straw -- Puree -- Mechanical Soft -- Regular -- Multi-consistency -- Pill -- Cervical Esophageal Comment -- Thank you, Havery Moros, CCC-SLP [phone] PORTER,DABNEY 10/18/2019, 6:51 PM                Subjective: Pt says he wants to go home.  He says he can take his antibiotic pills at home.  He has malaise but no other complaints.  No SOB and no CP.  Discharge Exam: Vitals:   10/19/19 0756 10/19/19 0803  BP:    Pulse:    Resp:    Temp:    SpO2: 94% 100%   Vitals:   10/19/19 0102 10/19/19 0523 10/19/19 0756 10/19/19 0803  BP:  (!) 105/54    Pulse:  75    Resp:  20    Temp:  97.6 F (36.4 C)    TempSrc:  Oral    SpO2: 94% 94% 94% 100%  Weight:      Height:       General: Pt is alert, awake, not in acute distress Cardiovascular: RRR, S1/S2 +, no rubs, no gallops Respiratory:  no wheezing, no rhonchi Abdominal: Soft, NT, ND, bowel sounds + Extremities: no edema, no cyanosis   The results  of significant diagnostics from this hospitalization (including imaging, microbiology, ancillary and laboratory) are listed below for reference.     Microbiology: Recent Results (from the past 240 hour(s))  SARS Coronavirus 2 by RT PCR (hospital order, performed in [facility] hospital lab) Nasopharyngeal Nasopharyngeal Swab     Status: None   Collection Time: 10/17/19  9:46 AM   Specimen: Nasopharyngeal Swab  Result Value Ref Range Status   SARS Coronavirus 2 NEGATIVE NEGATIVE Final    Comment: (NOTE) SARS-CoV-2 target nucleic acids are NOT DETECTED.  The SARS-CoV-2 RNA is generally detectable in upper and lower respiratory specimens during the acute phase of infection. The lowest concentration of SARS-CoV-2 viral copies this assay can detect is 250 copies / mL. A negative result does not preclude SARS-CoV-2 infection and should not be used as the sole basis for treatment or other patient management decisions.  A negative result may occur with improper specimen collection / handling, submission of specimen other than nasopharyngeal swab, presence of viral mutation(s) within the areas targeted by this assay, and inadequate number of viral copies (<250 copies / mL). A negative result must be combined with clinical observations, patient history, and epidemiological information.  Fact Sheet for Patients:   BoilerBrush.com.cy  Fact Sheet for Healthcare Providers: https://pope.com/  This test is not yet approved or  cleared by the [place] FDA and has been authorized for detection and/or diagnosis of SARS-CoV-2 by FDA under an Emergency Use Authorization (EUA).  This EUA will remain in effect (meaning this test can be used) for the duration of the COVID-19 declaration under Section 564(b)(1) of the Act, 21 U.S.C. section 360bbb-3(b)(1), unless the authorization is terminated or revoked sooner.  Performed at [facility], [address], [place], [place] [address]   Culture, blood (routine x 2)     Status: None (Preliminary result)   Collection Time: 10/17/19 10:30 AM   Specimen: BLOOD RIGHT HAND  Result Value Ref Range Status   Specimen Description BLOOD RIGHT HAND  Final   Special Requests   Final    BOTTLES DRAWN AEROBIC AND ANAEROBIC Blood Culture adequate volume   Culture   Final    NO GROWTH 2 DAYS Performed at [facility], [address] [place], [place] [address]    Report Status PENDING  Incomplete  Culture, blood (routine x 2)     Status: None (Preliminary result)   Collection Time: 10/17/19 10:37 AM   Specimen: BLOOD LEFT HAND  Result Value Ref Range Status   Specimen Description BLOOD LEFT HAND  Final   Special Requests   Final    BOTTLES DRAWN AEROBIC AND ANAEROBIC Blood Culture adequate volume   Culture  Final    NO GROWTH 2 DAYS Performed at [facility], [address] [place], [place] [address]    Report Status PENDING  Incomplete     Labs: BNP (last 3 results) Recent Labs    10/17/19 0930  BNP 149.0*   Basic Metabolic Panel: Recent Labs  Lab 10/17/19 0930 10/18/19 0540 10/19/19 0455  NA 136 138 139  K 3.3* 4.4 3.8  CL 99 105 106  CO2 26 25 25   GLUCOSE 182* 111* 94  BUN 14 13 16   CREATININE 0.62 0.51* 0.67  CALCIUM 8.8* 8.5* 8.5*  MG  --  2.0  --   PHOS  --  2.3*  --    Liver Function Tests: No results for input(s): AST, ALT, ALKPHOS, BILITOT, PROT, ALBUMIN in the last 168 hours. No results for input(s): LIPASE, AMYLASE in the last 168 hours. No results for input(s): AMMONIA in the last 168 hours. CBC: Recent Labs  Lab 10/17/19 0930 10/18/19 0540 10/19/19 0455  WBC 11.0* 9.3 7.2  NEUTROABS 10.1* 8.0*  --   HGB 11.3* 9.8* 9.9*  HCT 35.1* 31.1* 31.8*  MCV 94.1 95.7 96.1  PLT 187 176 192   Cardiac Enzymes: No results for input(s): CKTOTAL, CKMB, CKMBINDEX, TROPONINI in the last 168 hours. BNP: Invalid input(s): POCBNP CBG: Recent Labs  Lab 10/17/19 1450   GLUCAP 174*   D-Dimer Recent Labs    10/17/19 0930  DDIMER 2.02*   Hgb A1c No results for input(s): HGBA1C in the last 72 hours. Lipid Profile No results for input(s): CHOL, HDL, LDLCALC, TRIG, CHOLHDL, LDLDIRECT in the last 72 hours. Thyroid function studies No results for input(s): TSH, T4TOTAL, T3FREE, THYROIDAB in the last 72 hours.  Invalid input(s): FREET3 Anemia work up No results for input(s): VITAMINB12, FOLATE, FERRITIN, TIBC, IRON, RETICCTPCT in the last 72 hours. Urinalysis    Component Value Date/Time   COLORURINE AMBER (A) 02/05/2015 1915   APPEARANCEUR CLEAR 02/05/2015 1915   LABSPEC 1.025 02/05/2015 1915   PHURINE 6.0 02/05/2015 1915   GLUCOSEU NEGATIVE 02/05/2015 1915   HGBUR MODERATE (A) 02/05/2015 1915   BILIRUBINUR SMALL (A) 02/05/2015 1915   KETONESUR 15 (A) 02/05/2015 1915   PROTEINUR 30 (A) 02/05/2015 1915   UROBILINOGEN 1.0 02/05/2015 1915   NITRITE NEGATIVE 02/05/2015 1915   LEUKOCYTESUR NEGATIVE 02/05/2015 1915   Sepsis Labs Invalid input(s): PROCALCITONIN,  WBC,  LACTICIDVEN Microbiology Recent Results (from the past 240 hour(s))  SARS Coronavirus 2 by RT PCR (hospital order, performed in [facility] hospital lab) Nasopharyngeal Nasopharyngeal Swab     Status: None   Collection Time: 10/17/19  9:46 AM   Specimen: Nasopharyngeal Swab  Result Value Ref Range Status   SARS Coronavirus 2 NEGATIVE NEGATIVE Final    Comment: (NOTE) SARS-CoV-2 target nucleic acids are NOT DETECTED.  The SARS-CoV-2 RNA is generally detectable in upper and lower respiratory specimens during the acute phase of infection. The lowest concentration of SARS-CoV-2 viral copies this assay can detect is 250 copies / mL. A negative result does not preclude SARS-CoV-2 infection and should not be used as the sole basis for treatment or other patient management decisions.  A negative result may occur with improper specimen collection / handling, submission of specimen  other than nasopharyngeal swab, presence of viral mutation(s) within the areas targeted by this assay, and inadequate number of viral copies (<250 copies / mL). A negative result must be combined with clinical observations, patient history, and epidemiological information.  Fact Sheet  for Patients:   BoilerBrush.com.cy  Fact Sheet for Healthcare Providers: https://pope.com/  This test is not yet approved or  cleared by the [place] FDA and has been authorized for detection and/or diagnosis of SARS-CoV-2 by FDA under an Emergency Use Authorization (EUA).  This EUA will remain in effect (meaning this test can be used) for the duration of the COVID-19 declaration under Section 564(b)(1) of the Act, 21 U.S.C. section 360bbb-3(b)(1), unless the authorization is terminated or revoked sooner.  Performed at [facility], [address] [place], [place] [address]   Culture, blood (routine x 2)     Status: None (Preliminary result)   Collection Time: 10/17/19 10:30 AM   Specimen: BLOOD RIGHT HAND  Result Value Ref Range Status   Specimen Description BLOOD RIGHT HAND  Final   Special Requests   Final    BOTTLES DRAWN AEROBIC AND ANAEROBIC Blood Culture adequate volume   Culture   Final    NO GROWTH 2 DAYS Performed at [facility], [address] [place], [place] [address]    Report Status PENDING  Incomplete  Culture, blood (routine x 2)     Status: None (Preliminary result)   Collection Time: 10/17/19 10:37 AM   Specimen: BLOOD LEFT HAND  Result Value Ref Range Status   Specimen Description BLOOD LEFT HAND  Final   Special Requests   Final    BOTTLES DRAWN AEROBIC AND ANAEROBIC Blood Culture adequate volume   Culture   Final    NO GROWTH 2 DAYS Performed at [facility], [address], [place], [place] [address]    Report Status PENDING  Incomplete   Time coordinating discharge: 33 minutes   SIGNED:  Standley Dakins,  MD  [facility] 10/19/2019, 10:17 AM How to contact the [facility] Attending or Consulting provider 7A - 7P or covering provider during after hours 7P -7A, for this patient?  Check the care team in [facility] and look for a) attending/consulting TR[facility] provider listed and b) the [facility] team listed Log into [email or web address] and use [place]'s universal password to access. If you do not have the password, please contact the hospital operator. Locate the [facility] provider you are looking for under [facility] and page to a number that you can be directly reached. If you still have difficulty reaching the provider, please page the [facility] (Director on Call) for the Hospitalists listed on amion for assistance.

## 2019-10-19 NOTE — Progress Notes (Signed)
  Speech Language Pathology Treatment: Dysphagia  Patient Details Name: [name] MRN: [id number] DOB: 1960/07/09 Today's Date: 10/19/2019 Time: 0950-1000 SLP Time Calculation (min) (ACUTE ONLY): 10 min  Assessment / Plan / Recommendation Clinical Impression  Pt seen for follow up education regarding MBSS completed yesterday. Pt was able to verbalize strategies that were recommended and written down for Pt yesterday (swallow fast and hard, small sips, clear throat/cough periodically after liquids, and repeat swallow). He reports a dry cough and congestion today. Pt was also reminded about the importance of oral care, sitting upright for meals, and mobilization to mitigate aspiration risks. Pt appreciative of recommendations and states he will be going home today. No further SLP services at this time. SLP will sign off.    HPI HPI: [name] is a 59 y.o. male with medical history significant for PE on Xarelto, protein S deficiency, asthma, COPD, chronic anxiety/depression/bipolar disorder, tobacco use disorder who presented to [facility], ED with complaints of right-sided chest pain, pleuritic, worse with taking a deep breath, of 4 days duration.  Associated with a productive cough, dyspnea with minimal exertion, night sweats and chills.  Reports noncompliance with Xarelto, forgetting to take it sometimes.  Admits to vaping 7-8 times per day.  Ongoing tobacco use since the age of 67.  No significant weight loss.  Denies any recent lengthy trips, exposure to tuberculosis or traveling outside of the [place].  He presented to the ED for further evaluation and management. Upon presentation to the ED, hypoxic with O2 saturation in the mid 80s on room air.  Chest x-ray suggestive of right middle lobe pneumonia.  Denies any use of alcohol or passing out.  Started on empiric IV antibiotics for community-acquired pneumonia in the ED. Pt reports that he has been vaping for about a year. He had MBSS  01/24/2010.      SLP Plan  Discharge SLP treatment due to (comment);All goals met       Recommendations  Diet recommendations: Regular;Thin liquid Liquids provided via: Cup;No straw Medication Administration: Whole meds with liquid Supervision: Patient able to self feed Compensations: Small sips/bites;Clear throat intermittently;Effortful swallow;Multiple dry swallows after each bite/sip Postural Changes and/or Swallow Maneuvers: Out of bed for meals;Seated upright 90 degrees;Upright 30-60 min after meal                Oral Care Recommendations: Oral care BID Follow up Recommendations: None SLP Visit Diagnosis: Dysphagia, oropharyngeal phase (R13.12) Plan: Discharge SLP treatment due to (comment);All goals met       Thank you,  Genene Churn, [place]                 [place] 10/19/2019, 10:03 AM

## 2019-10-22 LAB — CULTURE, BLOOD (ROUTINE X 2)
Culture: NO GROWTH
Culture: NO GROWTH
Special Requests: ADEQUATE
Special Requests: ADEQUATE

## 2019-10-28 ENCOUNTER — Ambulatory Visit (HOSPITAL_COMMUNITY)
Admission: RE | Admit: 2019-10-28 | Discharge: 2019-10-28 | Disposition: A | Discharge status: Home / Self Care | Payer: Medicare Other | Source: Ambulatory Visit | Attending: Adult Health | Admitting: Adult Health

## 2019-10-28 ENCOUNTER — Other Ambulatory Visit: Payer: Self-pay

## 2019-10-28 ENCOUNTER — Other Ambulatory Visit (HOSPITAL_COMMUNITY): Payer: Self-pay | Admitting: Adult Health

## 2019-10-28 DIAGNOSIS — R0602 Shortness of breath: Secondary | ICD-10-CM

## 2019-12-07 ENCOUNTER — Other Ambulatory Visit: Payer: Self-pay

## 2019-12-07 ENCOUNTER — Encounter (HOSPITAL_COMMUNITY): Payer: Self-pay | Admitting: *Deleted

## 2019-12-07 ENCOUNTER — Emergency Department (HOSPITAL_COMMUNITY)
Admission: EM | Admit: 2019-12-07 | Discharge: 2019-12-07 | Disposition: A | Discharge status: Home / Self Care | Payer: Medicare Other | Attending: Emergency Medicine | Admitting: Emergency Medicine

## 2019-12-07 DIAGNOSIS — R112 Nausea with vomiting, unspecified: Secondary | ICD-10-CM | POA: Diagnosis not present

## 2019-12-07 DIAGNOSIS — Z5321 Procedure and treatment not carried out due to patient leaving prior to being seen by health care provider: Secondary | ICD-10-CM | POA: Insufficient documentation

## 2019-12-07 DIAGNOSIS — M791 Myalgia, unspecified site: Secondary | ICD-10-CM | POA: Diagnosis not present

## 2019-12-07 DIAGNOSIS — F13239 Sedative, hypnotic or anxiolytic dependence with withdrawal, unspecified: Secondary | ICD-10-CM | POA: Insufficient documentation

## 2019-12-07 LAB — CBC WITH DIFFERENTIAL/PLATELET
Abs Immature Granulocytes: 0.02 10*3/uL (ref 0.00–0.07)
Basophils Absolute: 0 10*3/uL (ref 0.0–0.1)
Basophils Relative: 1 %
Eosinophils Absolute: 0 10*3/uL (ref 0.0–0.5)
Eosinophils Relative: 1 %
HCT: 45 % (ref 39.0–52.0)
Hemoglobin: 14.4 g/dL (ref 13.0–17.0)
Immature Granulocytes: 0 %
Lymphocytes Relative: 22 %
Lymphs Abs: 1.5 10*3/uL (ref 0.7–4.0)
MCH: 29.2 pg (ref 26.0–34.0)
MCHC: 32 g/dL (ref 30.0–36.0)
MCV: 91.3 fL (ref 80.0–100.0)
Monocytes Absolute: 0.4 10*3/uL (ref 0.1–1.0)
Monocytes Relative: 6 %
Neutro Abs: 4.9 10*3/uL (ref 1.7–7.7)
Neutrophils Relative %: 70 %
Platelets: 191 10*3/uL (ref 150–400)
RBC: 4.93 MIL/uL (ref 4.22–5.81)
RDW: 12.8 % (ref 11.5–15.5)
WBC: 7 10*3/uL (ref 4.0–10.5)
nRBC: 0 % (ref 0.0–0.2)

## 2019-12-07 LAB — COMPREHENSIVE METABOLIC PANEL
ALT: 14 U/L (ref 0–44)
AST: 14 U/L — ABNORMAL LOW (ref 15–41)
Albumin: 4.3 g/dL (ref 3.5–5.0)
Alkaline Phosphatase: 73 U/L (ref 38–126)
Anion gap: 10 (ref 5–15)
BUN: 10 mg/dL (ref 6–20)
CO2: 25 mmol/L (ref 22–32)
Calcium: 9.2 mg/dL (ref 8.9–10.3)
Chloride: 102 mmol/L (ref 98–111)
Creatinine, Ser: 0.78 mg/dL (ref 0.61–1.24)
GFR calc Af Amer: >60 mL/min (ref >60)
GFR calc non Af Amer: >60 mL/min (ref >60)
Glucose, Bld: 101 mg/dL — ABNORMAL HIGH (ref 70–99)
Potassium: 4 mmol/L (ref 3.5–5.1)
Sodium: 137 mmol/L (ref 135–145)
Total Bilirubin: 0.4 mg/dL (ref 0.3–1.2)
Total Protein: 8 g/dL (ref 6.5–8.1)

## 2019-12-07 NOTE — ED Notes (Signed)
Pt came into triage advising that he wanted to leave, pt instructed to stay and the risks of leaving,

## 2019-12-07 NOTE — ED Triage Notes (Signed)
Pt c/o body aches, not eating, nausea/vomiting, states that he quit taking suboxone, xanax, adderall 8 days ago,

## 2019-12-07 NOTE — ED Notes (Signed)
Pt no longer in vertical waiting area, not in lobby,

## 2019-12-09 ENCOUNTER — Encounter (HOSPITAL_COMMUNITY): Payer: Self-pay

## 2019-12-09 ENCOUNTER — Emergency Department (HOSPITAL_COMMUNITY): Payer: Medicare Other

## 2019-12-09 ENCOUNTER — Emergency Department (HOSPITAL_COMMUNITY)
Admission: EM | Admit: 2019-12-09 | Discharge: 2019-12-09 | Disposition: A | Discharge status: Home / Self Care | Payer: Medicare Other | Attending: Emergency Medicine | Admitting: Emergency Medicine

## 2019-12-09 ENCOUNTER — Other Ambulatory Visit: Payer: Self-pay

## 2019-12-09 DIAGNOSIS — J449 Chronic obstructive pulmonary disease, unspecified: Secondary | ICD-10-CM | POA: Diagnosis not present

## 2019-12-09 DIAGNOSIS — R4182 Altered mental status, unspecified: Secondary | ICD-10-CM | POA: Insufficient documentation

## 2019-12-09 DIAGNOSIS — Z87891 Personal history of nicotine dependence: Secondary | ICD-10-CM | POA: Diagnosis not present

## 2019-12-09 DIAGNOSIS — T50901A Poisoning by unspecified drugs, medicaments and biological substances, accidental (unintentional), initial encounter: Secondary | ICD-10-CM | POA: Diagnosis present

## 2019-12-09 DIAGNOSIS — Z7901 Long term (current) use of anticoagulants: Secondary | ICD-10-CM | POA: Insufficient documentation

## 2019-12-09 DIAGNOSIS — Z79899 Other long term (current) drug therapy: Secondary | ICD-10-CM | POA: Diagnosis not present

## 2019-12-09 DIAGNOSIS — J45909 Unspecified asthma, uncomplicated: Secondary | ICD-10-CM | POA: Diagnosis not present

## 2019-12-09 DIAGNOSIS — Z20822 Contact with and (suspected) exposure to covid-19: Secondary | ICD-10-CM | POA: Insufficient documentation

## 2019-12-09 LAB — COMPREHENSIVE METABOLIC PANEL
ALT: 15 U/L (ref 0–44)
AST: 16 U/L (ref 15–41)
Albumin: 4.1 g/dL (ref 3.5–5.0)
Alkaline Phosphatase: 75 U/L (ref 38–126)
Anion gap: 12 (ref 5–15)
BUN: 18 mg/dL (ref 6–20)
CO2: 25 mmol/L (ref 22–32)
Calcium: 8.7 mg/dL — ABNORMAL LOW (ref 8.9–10.3)
Chloride: 101 mmol/L (ref 98–111)
Creatinine, Ser: 0.85 mg/dL (ref 0.61–1.24)
GFR calc Af Amer: >60 mL/min (ref >60)
GFR calc non Af Amer: >60 mL/min (ref >60)
Glucose, Bld: 107 mg/dL — ABNORMAL HIGH (ref 70–99)
Potassium: 3.3 mmol/L — ABNORMAL LOW (ref 3.5–5.1)
Sodium: 138 mmol/L (ref 135–145)
Total Bilirubin: 0.5 mg/dL (ref 0.3–1.2)
Total Protein: 7.5 g/dL (ref 6.5–8.1)

## 2019-12-09 LAB — CBC WITH DIFFERENTIAL/PLATELET
Abs Immature Granulocytes: 0.01 10*3/uL (ref 0.00–0.07)
Basophils Absolute: 0 10*3/uL (ref 0.0–0.1)
Basophils Relative: 1 %
Eosinophils Absolute: 0.2 10*3/uL (ref 0.0–0.5)
Eosinophils Relative: 2 %
HCT: 40 % (ref 39.0–52.0)
Hemoglobin: 12.7 g/dL — ABNORMAL LOW (ref 13.0–17.0)
Immature Granulocytes: 0 %
Lymphocytes Relative: 18 %
Lymphs Abs: 1.2 10*3/uL (ref 0.7–4.0)
MCH: 29.5 pg (ref 26.0–34.0)
MCHC: 31.8 g/dL (ref 30.0–36.0)
MCV: 93 fL (ref 80.0–100.0)
Monocytes Absolute: 0.6 10*3/uL (ref 0.1–1.0)
Monocytes Relative: 10 %
Neutro Abs: 4.4 10*3/uL (ref 1.7–7.7)
Neutrophils Relative %: 69 %
Platelets: 155 10*3/uL (ref 150–400)
RBC: 4.3 MIL/uL (ref 4.22–5.81)
RDW: 13.3 % (ref 11.5–15.5)
WBC: 6.4 10*3/uL (ref 4.0–10.5)
nRBC: 0 % (ref 0.0–0.2)

## 2019-12-09 LAB — ETHANOL: Alcohol, Ethyl (B): <10 mg/dL (ref <10)

## 2019-12-09 LAB — ACETAMINOPHEN LEVEL: Acetaminophen (Tylenol), Serum: <10 ug/mL — ABNORMAL LOW (ref 10–30)

## 2019-12-09 LAB — RAPID URINE DRUG SCREEN, HOSP PERFORMED
Amphetamines: POSITIVE — AB
Barbiturates: NOT DETECTED
Benzodiazepines: POSITIVE — AB
Cocaine: NOT DETECTED
Opiates: POSITIVE — AB
Tetrahydrocannabinol: NOT DETECTED

## 2019-12-09 LAB — SALICYLATE LEVEL: Salicylate Lvl: <7 mg/dL — ABNORMAL LOW (ref 7.0–30.0)

## 2019-12-09 LAB — SARS CORONAVIRUS 2 BY RT PCR (HOSPITAL ORDER, PERFORMED IN ~~LOC~~ HOSPITAL LAB): SARS Coronavirus 2: NEGATIVE

## 2019-12-09 MED ORDER — NALOXONE HCL 2 MG/2ML IJ SOSY
PREFILLED_SYRINGE | INTRAMUSCULAR | Status: AC
  Filled 2019-12-09: qty 2

## 2019-12-09 MED ORDER — SODIUM CHLORIDE 0.9 % IV BOLUS
1000.0000 mL | Freq: Once | INTRAVENOUS | Status: AC
  Administered 2019-12-09: 1000 mL via INTRAVENOUS

## 2019-12-09 MED ORDER — POTASSIUM CHLORIDE CRYS ER 20 MEQ PO TBCR
40.0000 meq | EXTENDED_RELEASE_TABLET | Freq: Once | ORAL | Status: AC
  Administered 2019-12-09: 40 meq via ORAL
  Filled 2019-12-09: qty 2

## 2019-12-09 MED ORDER — NALOXONE HCL 2 MG/2ML IJ SOSY
2.0000 mg | PREFILLED_SYRINGE | Freq: Once | INTRAMUSCULAR | Status: AC
  Administered 2019-12-09: 2 mg via INTRAVENOUS

## 2019-12-09 NOTE — ED Notes (Signed)
No reaction to narcan

## 2019-12-09 NOTE — Discharge Instructions (Addendum)
Your labwork and chest xray were reassuring. There were no signs of pneumonia on the x ray.   Your potassium level was 3.3 today (normal is 3.5-4.5). We have provided you with a potassium supplement in the ED today. Please have your potassium level rechecked in 1 week.

## 2019-12-09 NOTE — ED Notes (Signed)
Pt responds to voice and follows commands.  Noisy gargling  Respirations.

## 2019-12-09 NOTE — ED Triage Notes (Addendum)
Pt was seen by parole officer day before yesterday and was sent to detox today and they wouldn't accept due to high BP and lung sounds. Per sister in law  They question if he has pneumonia  He has been out of his regular meds and taking anything he can . States he was hyper earlier then went lethargic. Reports he has been taking any kind of drug he can get his hands on. He takes suboxone , ativan, adderal and xanax. Pt is very lethargic and hard to arouse. Pt states he used crystal meth today

## 2019-12-09 NOTE — ED Provider Notes (Signed)
[facility] EMERGENCY DEPARTMENT Provider Note   CSN: [id number] Arrival date & time: 12/09/19  1354     History Chief Complaint  Patient presents with  . Drug Overdose   LEVEL 5 CAVEAT - ALTERED LEVEL OF CONSCIOUSNESS  [name] is a 59 y.o. male with PMHx COPD, Bipolar disorder, polysubstance abuse who presents to the ED today for possible drug overdose. Sister in law at bedside and does most of the history - reports that she picked pt up from his house today to transport him to a detox program today. She notes that he was very wired and jittery when she picked him up and she believes he used crystal meth sometime last night. She states that at the detox his blood pressure was noted to be elevated and his lungs had rales so they told her to bring him to the ED with concern for possible aspiration PNA. SIL reports that pt did not start having a rattling sound in his chest until he laid flat for examination. She is unsure if he took anything else however reports that he takes suboxone, ativan, adderall, and xanax as well. She is unsure if he took anything else.   The history is provided by the patient, a relative and medical records.       Past Medical History:  Diagnosis Date  . Anxiety   . Arthritis   . Asthma   . Bipolar disorder (HCC)   . Chronic pain   . COPD (chronic obstructive pulmonary disease) (HCC)   . Degenerative disk disease   . GERD (gastroesophageal reflux disease)   . Hiatal hernia   . History of pneumonia   . PE (pulmonary embolism) 2012  . Protein S deficiency [facility])     Patient Active Problem List   Diagnosis Date Noted  . CAP (community acquired pneumonia) 10/17/2019  . Recurrent incisional hernia   . Incisional hernia, without obstruction or gangrene   . Acute respiratory failure with hypoxia (HCC)   . COPD exacerbation (HCC) 02/07/2015  . Community acquired pneumonia 02/05/2015  . Polysubstance dependence including opioid type drug, episodic  abuse (HCC) 02/05/2015  . Healthcare-associated pneumonia 08/17/2013  . Osteoarthritis 08/17/2013  . GERD (gastroesophageal reflux disease) 08/17/2013  . Hx of pulmonary embolus 08/17/2013  . 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 COPD (chronic obstructive pulmonary disease) (HCC) 08/17/2013  . Anxiety 08/17/2013  . Hypokalemia 08/17/2013    Past Surgical History:  Procedure Laterality Date  . CHOLECYSTECTOMY    . CLOSED REDUCTION MANDIBLE WITH MANDIBULOMA  10/21/2011   Procedure: CLOSED REDUCTION MANDIBLE WITH MANDIBULOMAXILLARY FUSION;  Surgeon: Darletta Moll, MD;  Location: [facility] OR;  Service: ENT;  Laterality: N/A;  MMF screws  . HAND SURGERY Right 20 years ago  . INCISIONAL HERNIA REPAIR N/A 10/27/2018   Procedure: HERNIA REPAIR INCISIONAL WITH MESH;  Surgeon: Franky Macho, MD;  Location: [facility];  Service: General;  Laterality: N/A;  . INCISIONAL HERNIA REPAIR N/A 02/23/2019   Procedure: LAPAROSCOPIC INCISIONAL HERNIA REPAIR WITH MESH;  Surgeon: Franky Macho, MD;  Location: [facility] ORS;  Service: General;  Laterality: N/A;  . LEG SURGERY    . MANDIBULAR HARDWARE REMOVAL  11/11/2011   Procedure: MANDIBULAR HARDWARE REMOVAL;  Surgeon: Darletta Moll, MD;  Location: [facility];  Service: ENT;  Laterality: N/A;  . ORIF MANDIBULAR FRACTURE  10/21/2011   Procedure: OPEN REDUCTION INTERNAL FIXATION (ORIF) MANDIBULAR FRACTURE;  Surgeon: Darletta Moll, MD;  Location: [facility] OR;  Service: ENT;  Laterality: N/A;  . UMBILICAL HERNIA REPAIR         Family History  Problem Relation Age of Onset  . Coronary artery disease Mother   . Coronary artery disease Father   . Diabetes Other     Social History   Tobacco Use  . Smoking status: Former Smoker    Packs/day: 0.25    Years: 35.00    Pack years: 8.75  . Smokeless tobacco: Never Used  Vaping Use  . Vaping Use: Every day  Substance Use Topics  . Alcohol use: No    Alcohol/week: 0.0 standard drinks  . Drug use: Yes      Comment: suboxone, adderall, ativan, meth, xanaa    Home Medications Prior to Admission medications   Medication Sig Start Date End Date Taking? Authorizing Provider  albuterol (PROVENTIL HFA;VENTOLIN HFA) 108 (90 BASE) MCG/ACT inhaler Inhale 2 puffs into the lungs every 4 (four) hours as needed for wheezing or shortness of breath.     [provider]  ALPRAZolam Prudy Feeler) 0.5 MG tablet Take 0.5 mg by mouth 2 (two) times daily. 10/10/19   [provider]  amphetamine-dextroamphetamine (ADDERALL) 20 MG tablet Take 20 mg by mouth 2 (two) times daily. 01/17/19   [provider]  atorvastatin (LIPITOR) 10 MG tablet Take 10 mg by mouth daily.  09/21/19   [provider]  budesonide-formoterol (SYMBICORT) 80-4.5 MCG/ACT inhaler Inhale 2 puffs into the lungs 2 (two) times daily. 06/23/19   [provider]  busPIRone (BUSPAR) 5 MG tablet Take 5 mg by mouth 2 (two) times daily.  09/21/19   [provider]  gabapentin (NEURONTIN) 300 MG capsule Take 1 capsule (300 mg total) by mouth 3 (three) times daily. 10/27/18 10/27/19  Franky Macho, MD  guaiFENesin-dextromethorphan (ROBITUSSIN DM) 100-10 MG/5ML syrup Take 5 mLs by mouth every 4 (four) hours as needed for cough. 10/19/19   Johnson, Clanford L, MD  meclizine (ANTIVERT) 12.5 MG tablet Take 12.5 mg by mouth 3 (three) times daily as needed for dizziness.    [provider]  Milk Thistle 1000 MG CAPS Take 1,000 mg by mouth daily.     [provider]  omeprazole (PRILOSEC) 40 MG capsule Take 40 mg by mouth daily.    [provider]  oxyCODONE-acetaminophen (PERCOCET) 7.5-325 MG tablet Take 1 tablet by mouth every 4 (four) hours as needed for severe pain. 03/01/19 02/29/20  Franky Macho, MD  promethazine (PHENERGAN) 25 MG tablet Take 25 mg by mouth every 6 (six) hours as needed for nausea or vomiting.    [provider]  QUEtiapine (SEROQUEL) 100 MG tablet Take 100 mg by mouth  at bedtime.  08/04/13   [provider]  Rivaroxaban (XARELTO) 20 MG TABS Take 20 mg by mouth daily.    [provider]  SUBOXONE 8-2 MG FILM Take 1 Film by mouth in the morning, at noon, and at bedtime.  02/14/19   [provider]  triamcinolone cream (KENALOG) 0.5 % Apply 1 application topically 2 (two) times daily as needed.  10/11/19   [provider]  venlafaxine XR (EFFEXOR-XR) 150 MG 24 hr capsule Take 150 mg by mouth daily with breakfast.     [provider]    Allergies    Patient has no known allergies.  Review of Systems   Review of Systems  Unable to perform ROS: Mental status change  Constitutional: Negative for fever.  Physical Exam Updated Vital Signs BP 121/90   Pulse 83   Temp 98.2 F (36.8 C) (Oral)   Resp (!) 23   Ht 6\' 4"  (1.93 m)   Wt 108.9 kg   SpO2 94%   BMI 29.21 kg/m   Physical Exam Vitals and nursing note reviewed.  Constitutional:      Appearance: He is not ill-appearing or diaphoretic.     Comments: Arousable to voice  HENT:     Head: Normocephalic and atraumatic.  Eyes:     Conjunctiva/sclera: Conjunctivae normal.     Comments: Pinpoint pupils  Cardiovascular:     Rate and Rhythm: Normal rate and regular rhythm.  Pulmonary:     Effort: Pulmonary effort is normal.     Breath sounds: Rales present.     Comments: Satting 91-94% on RA. Audible rales however does appear to be coming from proximal airway/throat. Sound improves when pt clears his throat on command. Lungs clear to auscultation at the bases Abdominal:     Palpations: Abdomen is soft.     Tenderness: There is no abdominal tenderness. There is no guarding or rebound.  Musculoskeletal:     Cervical back: Neck supple.  Skin:    General: Skin is warm and dry.     Comments: Tract marks noted to right [facility]     ED Results / Procedures / Treatments   Labs (all labs ordered are listed, but only abnormal results are displayed) Labs Reviewed   COMPREHENSIVE METABOLIC PANEL - Abnormal; Notable for the following components:      Result Value   Potassium 3.3 (*)    Glucose, Bld 107 (*)    Calcium 8.7 (*)    All other components within normal limits  CBC WITH DIFFERENTIAL/PLATELET - Abnormal; Notable for the following components:   Hemoglobin 12.7 (*)    All other components within normal limits  RAPID URINE DRUG SCREEN, HOSP PERFORMED - Abnormal; Notable for the following components:   Opiates POSITIVE (*)    Benzodiazepines POSITIVE (*)    Amphetamines POSITIVE (*)    All other components within normal limits  SALICYLATE LEVEL - Abnormal; Notable for the following components:   Salicylate Lvl <7.0 (*)    All other components within normal limits  ACETAMINOPHEN LEVEL - Abnormal; Notable for the following components:   Acetaminophen (Tylenol), Serum <10 (*)    All other components within normal limits  SARS CORONAVIRUS 2 BY RT PCR (HOSPITAL ORDER, PERFORMED IN [facility])  ETHANOL    EKG None  Radiology DG Chest 2 View  Result Date: 12/09/2019 CLINICAL DATA:  Drug overdose.  Possible pneumonia. EXAM: CHEST - 2 VIEW COMPARISON:  Chest x-ray from same day at 1518 hours. FINDINGS: The heart size and mediastinal contours are within normal limits. Normal pulmonary vascularity. Chronic mildly coarsened interstitial markings are similar to prior studies. Mild bibasilar atelectasis. No focal consolidation, pleural effusion, or pneumothorax. No acute osseous abnormality. IMPRESSION: 1. Mild bibasilar atelectasis.  No definite pneumonia. Electronically Signed   By: 12/11/2019 M.D.   On: 12/09/2019 18:02   DG Chest Port 1 View  Result Date: 12/09/2019 CLINICAL DATA:  Elevated blood pressure, abnormal lung sounds, weakness, polysubstance abuse, inter detox, history COPD, GERD EXAM: PORTABLE CHEST 1 VIEW COMPARISON:  Portable exam 1518 hours compared to 10/28/2019 FINDINGS: Upper normal heart size. Mediastinal  contours and pulmonary vascularity normal. Central bronchitic changes. Scattered interstitial prominence little changed from previous study. New LEFT basilar  infiltrate versus atelectasis. No additional acute infiltrate, pleural effusion or pneumothorax. IMPRESSION: New LEFT basilar atelectasis versus infiltrate. Chronic bronchitic changes and interstitial prominence little changed from prior exam Electronically Signed   By: Ulyses Southward M.D.   On: 12/09/2019 15:26    Procedures Procedures (including critical care time)  Medications Ordered in ED Medications  naloxone [facility]) 2 MG/2ML injection (  Not Given 12/09/19 1535)  potassium chloride SA (KLOR-CON) CR tablet 40 mEq (has no administration in time range)  naloxone [facility]) injection 2 mg (2 mg Intravenous Given 12/09/19 1500)  sodium chloride 0.9 % bolus 1,000 mL (1,000 mLs Intravenous New Bag/Given 12/09/19 1522)    ED Course  I have reviewed the triage vital signs and the nursing notes.  Pertinent labs & imaging results that were available during my care of the patient were reviewed by me and considered in my medical decision making (see chart for details).  Clinical Course as of Dec 08 1836  Fri Dec 09, 2019  1804 SARS Coronavirus 2: NEGATIVE [MV]    Clinical Course User Index [MV] Tanda Rockers, PA-C   MDM Rules/Calculators/A&P                          59 year old male who presents the ED today for possible drug overdose.  History of same.  Sister-in-law was taking patient to a detox center today when she noted he was very jittery.  At detox his blood pressure was elevated and his lungs were not clear so they sent him to the ED for further evaluation with concern for possible aspiration pneumonia.  On arrival to the ED patient is arousable to loud noises and painful stimuli.  He was able to sit up after commanding patient and waking him up to allow me to listen to his heart and lungs.  Patient does have rales throughout however  it does appear to be mostly located in the upper airway.  Patient will clear his throat and this will improve.  He then quickly will go back to sleep.  He was given Narcan IV without any significant improvement.  Will monitor at this time.  EKG, lab work including UDS, Tylenol and aspirin levels.  Will obtain chest x-ray to ensure there is no aspiration pneumonia.  Will provide fluids at this time.  X-ray with questionable infiltrate versus atelectasis at the left base.  Had discussed this with attending physician Dr. Estell Harpin who recommends that when patient wakes up will get a 2 view chest x-ray for further evaluation.  It does appear he had pneumonia early this summer and he does have scarring noted on x-ray today per my read.   Approximately 2 hours into patient being in the ED he suddenly awoke and pulled his off the monitor and went to the bathroom.  He had gotten blood drawn out of his right hand and continued to drip blood all over the room in the bathroom.  Patient does mention to me that he took oxycodone earlier today that he bought off the street.  He is unsure if he took anything else however his sister-in-law did state that he took crystal meth likely early this morning/last night.  She does not admit this to me.  Awaiting UDS at this time.   CBC without leukocytosis. hgb stable at 12.7.  CMP with potassium 3.3. Glucose 107. Calcium 8.7. No other findings.  EtOH, Tylenol, and Aspirin levels negative  UDS positive for opiates, benzos,  and amphetamines.   Repeat xray without signs of PNA.   Pt alert and oriented x 4 now. He has been pacing back and forth in his room requesting something to eat. He appears steady on his feet without any assistance. Will discharge home at this time.   This note was prepared using Dragon voice recognition software and may include unintentional dictation errors due to the inherent limitations of voice recognition software.  Final Clinical Impression(s) / ED  Diagnoses Final diagnoses:  Accidental drug overdose, initial encounter    Rx / DC Orders ED Discharge Orders    None       Discharge Instructions     Your labwork and chest xray were reassuring. There were no signs of pneumonia on the x ray.   Your potassium level was 3.3 today (normal is 3.5-4.5). We have provided you with a potassium supplement in the ED today. Please have your potassium level rechecked in 1 week.        Tanda RockersVenter, Priscilla Kirstein, PA-C 12/09/19 1839    Bethann BerkshireZammit, Joseph, MD 12/12/19 1048

## 2020-11-21 ENCOUNTER — Encounter (HOSPITAL_COMMUNITY): Payer: Self-pay

## 2020-11-21 ENCOUNTER — Other Ambulatory Visit: Payer: Self-pay

## 2020-11-21 ENCOUNTER — Emergency Department (HOSPITAL_COMMUNITY)
Admission: EM | Admit: 2020-11-21 | Discharge: 2020-11-21 | Disposition: A | Discharge status: Home / Self Care | Payer: Medicare Other | Attending: Emergency Medicine | Admitting: Emergency Medicine

## 2020-11-21 DIAGNOSIS — Z87891 Personal history of nicotine dependence: Secondary | ICD-10-CM | POA: Insufficient documentation

## 2020-11-21 DIAGNOSIS — F319 Bipolar disorder, unspecified: Secondary | ICD-10-CM | POA: Diagnosis not present

## 2020-11-21 DIAGNOSIS — J449 Chronic obstructive pulmonary disease, unspecified: Secondary | ICD-10-CM | POA: Diagnosis not present

## 2020-11-21 DIAGNOSIS — Z79899 Other long term (current) drug therapy: Secondary | ICD-10-CM | POA: Insufficient documentation

## 2020-11-21 DIAGNOSIS — R21 Rash and other nonspecific skin eruption: Secondary | ICD-10-CM | POA: Diagnosis present

## 2020-11-21 DIAGNOSIS — J45909 Unspecified asthma, uncomplicated: Secondary | ICD-10-CM | POA: Insufficient documentation

## 2020-11-21 MED ORDER — HYDROXYZINE HCL 25 MG PO TABS: 25.0000 mg | ORAL_TABLET | Freq: Four times a day (QID) | ORAL | 0 refills | Status: DC

## 2020-11-21 MED ORDER — PREDNISONE 10 MG PO TABS: ORAL_TABLET | ORAL | 0 refills | Status: DC

## 2020-11-21 NOTE — ED Triage Notes (Signed)
Pt presents to ED with rash on back of legs started 1 month ago, itches at times.

## 2020-11-21 NOTE — Discharge Instructions (Addendum)
Take the medication as directed until its finished.  Please note the hydroxyzine may cause drowsiness.  Do not drive or operate machinery while taking this medication.  Avoid hot steamy showers.  Follow-up with your primary care provider or the dermatologist listed in 1 week if your symptoms or not improving.

## 2020-11-21 NOTE — ED Provider Notes (Signed)
[facility] EMERGENCY DEPARTMENT Provider Note   CSN: [id number] Arrival date & time: 11/21/20  1013     History Chief Complaint  Patient presents with   Rash    [name] is a 60 y.o. male.   Rash Associated symptoms: no abdominal pain, no fatigue, no fever, no joint pain, no myalgias, no nausea, no shortness of breath, no sore throat and not vomiting       [name] is a 60 y.o. male who presents to the Emergency Department complaining of persistent rash x1 month.  He describes multiple red itching "bumps" to both lower extremities.  Denies known insect bites, new medications, fever or chills.  He has used multiple over-the-counter creams and Benadryl without relief.  No prior medical evaluation.  He denies any swelling, drainage or significant pain to the lesions.  Also, he denies oral lesions, rash to the hands or soles of the feet. No tick bites.  Nothing makes his symptoms better or worse.   Past Medical History:  Diagnosis Date   Anxiety    Arthritis    Asthma    Bipolar disorder (HCC)    Chronic pain    COPD (chronic obstructive pulmonary disease) (HCC)    Degenerative disk disease    GERD (gastroesophageal reflux disease)    Hiatal hernia    History of pneumonia    PE (pulmonary embolism) 2012   Protein S deficiency [facility])     Patient Active Problem List   Diagnosis Date Noted   CAP (community acquired pneumonia) 10/17/2019   Recurrent incisional hernia    Incisional hernia, without obstruction or gangrene    Acute respiratory failure with hypoxia (HCC)    COPD exacerbation (HCC) 02/07/2015   Community acquired pneumonia 02/05/2015   Polysubstance dependence including opioid type drug, episodic abuse (HCC) 02/05/2015   Healthcare-associated pneumonia 08/17/2013   Osteoarthritis 08/17/2013   GERD (gastroesophageal reflux disease) 08/17/2013   Hx of pulmonary embolus 08/17/2013   Hepatitis B infection with hepatitis C infection 08/17/2013   COPD  (chronic obstructive pulmonary disease) (HCC) 08/17/2013   Anxiety 08/17/2013   Hypokalemia 08/17/2013    Past Surgical History:  Procedure Laterality Date   CHOLECYSTECTOMY     CLOSED REDUCTION MANDIBLE WITH MANDIBULOMA  10/21/2011   Procedure: CLOSED REDUCTION MANDIBLE WITH MANDIBULOMAXILLARY FUSION;  Surgeon: Darletta Moll, MD;  Location: [facility] OR;  Service: ENT;  Laterality: N/A;  MMF screws   HAND SURGERY Right 20 years ago   INCISIONAL HERNIA REPAIR N/A 10/27/2018   Procedure: HERNIA REPAIR INCISIONAL WITH MESH;  Surgeon: Franky Macho, MD;  Location: [facility];  Service: General;  Laterality: N/A;   INCISIONAL HERNIA REPAIR N/A 02/23/2019   Procedure: LAPAROSCOPIC INCISIONAL HERNIA REPAIR WITH MESH;  Surgeon: Franky Macho, MD;  Location: [facility];  Service: General;  Laterality: N/A;   LEG SURGERY     MANDIBULAR HARDWARE REMOVAL  11/11/2011   Procedure: MANDIBULAR HARDWARE REMOVAL;  Surgeon: Darletta Moll, MD;  Location: [facility];  Service: ENT;  Laterality: N/A;   ORIF MANDIBULAR FRACTURE  10/21/2011   Procedure: OPEN REDUCTION INTERNAL FIXATION (ORIF) MANDIBULAR FRACTURE;  Surgeon: Darletta Moll, MD;  Location: [facility] OR;  Service: ENT;  Laterality: N/A;   UMBILICAL HERNIA REPAIR         Family History  Problem Relation Age of Onset   Coronary artery disease Mother    Coronary artery disease Father    Diabetes Other  Social History   Tobacco Use   Smoking status: Former    Packs/day: 0.25    Years: 35.00    Pack years: 8.75    Types: Cigarettes   Smokeless tobacco: Never  Vaping Use   Vaping Use: Every day  Substance Use Topics   Alcohol use: No    Alcohol/week: 0.0 standard drinks   Drug use: Yes    Comment: suboxone, adderall, ativan, meth, xanaa    Home Medications Prior to Admission medications   Medication Sig Start Date End Date Taking? Authorizing Provider  albuterol (PROVENTIL HFA;VENTOLIN HFA) 108 (90 BASE) MCG/ACT inhaler Inhale 2 puffs into the lungs  every 4 (four) hours as needed for wheezing or shortness of breath.     [provider]  ALPRAZolam Prudy Feeler) 0.5 MG tablet Take 0.5 mg by mouth 2 (two) times daily. 10/10/19   [provider]  amphetamine-dextroamphetamine (ADDERALL) 20 MG tablet Take 20 mg by mouth 2 (two) times daily. 01/17/19   [provider]  atorvastatin (LIPITOR) 10 MG tablet Take 10 mg by mouth daily.  09/21/19   [provider]  budesonide-formoterol (SYMBICORT) 80-4.5 MCG/ACT inhaler Inhale 2 puffs into the lungs 2 (two) times daily. 06/23/19   [provider]  busPIRone (BUSPAR) 5 MG tablet Take 5 mg by mouth 2 (two) times daily.  09/21/19   [provider]  gabapentin (NEURONTIN) 300 MG capsule Take 1 capsule (300 mg total) by mouth 3 (three) times daily. 10/27/18 10/27/19  Franky Macho, MD  guaiFENesin-dextromethorphan (ROBITUSSIN DM) 100-10 MG/5ML syrup Take 5 mLs by mouth every 4 (four) hours as needed for cough. 10/19/19   Johnson, Clanford L, MD  meclizine (ANTIVERT) 12.5 MG tablet Take 12.5 mg by mouth 3 (three) times daily as needed for dizziness.    [provider]  Milk Thistle 1000 MG CAPS Take 1,000 mg by mouth daily.     [provider]  omeprazole (PRILOSEC) 40 MG capsule Take 40 mg by mouth daily.    [provider]  promethazine (PHENERGAN) 25 MG tablet Take 25 mg by mouth every 6 (six) hours as needed for nausea or vomiting.    [provider]  QUEtiapine (SEROQUEL) 100 MG tablet Take 100 mg by mouth at bedtime.  08/04/13   [provider]  Rivaroxaban (XARELTO) 20 MG TABS Take 20 mg by mouth daily.    [provider]  SUBOXONE 8-2 MG FILM Take 1 Film by mouth in the morning, at noon, and at bedtime.  02/14/19   [provider]  triamcinolone cream (KENALOG) 0.5 % Apply 1 application topically 2 (two) times daily as needed.  10/11/19   [provider]  venlafaxine XR (EFFEXOR-XR) 150 MG 24 hr  capsule Take 150 mg by mouth daily with breakfast.     [provider]    Allergies    Patient has no known allergies.  Review of Systems   Review of Systems  Constitutional:  Negative for chills, fatigue and fever.  HENT:  Negative for mouth sores, sore throat and trouble swallowing.   Respiratory:  Negative for cough and shortness of breath.   Cardiovascular:  Negative for chest pain and palpitations.  Gastrointestinal:  Negative for abdominal pain, nausea and vomiting.  Genitourinary:  Negative for dysuria, penile discharge, penile pain, penile swelling, scrotal swelling and testicular pain.  Musculoskeletal:  Negative for arthralgias, back pain, myalgias, neck pain and neck stiffness.  Skin:  Positive for rash.  Neurological:  Negative for dizziness, weakness and numbness.  Hematological:  Does not bruise/bleed easily.   Physical Exam Updated Vital Signs BP 138/87 (BP Location: Right Arm)   Pulse 71   Temp 98 F (36.7 C) (Oral)   Resp 20   Ht 6\' 1"  (1.854 m)   Wt 99.8 kg   SpO2 99%   BMI 29.03 kg/m   Physical Exam Vitals and nursing note reviewed.  Constitutional:      Appearance: Normal appearance. He is not ill-appearing or toxic-appearing.  HENT:     Mouth/Throat:     Mouth: Mucous membranes are moist.     Pharynx: Oropharynx is clear.     Comments: No oral lesions, uvula is midline nonedematous.   Cardiovascular:     Rate and Rhythm: Normal rate and regular rhythm.     Pulses: Normal pulses.  Pulmonary:     Effort: Pulmonary effort is normal.     Breath sounds: Normal breath sounds.  Musculoskeletal:        General: No swelling. Normal range of motion.     Right lower leg: No edema.     Left lower leg: No edema.  Skin:    General: Skin is warm.     Capillary Refill: Capillary refill takes less than 2 seconds.     Findings: Rash present.     Comments: Multiple, scattered erythematous dried lesions of the bilateral lower extremities.  No  surrounding erythema, edema or drainage of the lesions.  Appear in various stages of healing.  Most are crusted.  Hands and feet are spared.  Neurological:     General: No focal deficit present.     Mental Status: He is alert.     Sensory: No sensory deficit.     Motor: No weakness.    ED Results / Procedures / Treatments   Labs (all labs ordered are listed, but only abnormal results are displayed) Labs Reviewed - No data to display  EKG None  Radiology No results found.  Procedures Procedures   Medications Ordered in ED Medications - No data to display  ED Course  I have reviewed the triage vital signs and the nursing notes.  Pertinent labs & imaging results that were available during my care of the patient were reviewed by me and considered in my medical decision making (see chart for details).    MDM Rules/Calculators/A&P                           Patient here for evaluation of rash to bilateral lower extremities.  States rash has been present for 1 month.  No improvement with over-the-counter creams and oral Benadryl.  Reports pruritus is well.  Rash appears consistent with insect bites.  No clinical concern for infectious process.  Plan includes treatment with steroid taper and Atarax for itching.  I recommended patient follow-up closely with dermatology if symptoms or not improving.  Patient agreeable to plan.  He appears appropriate for discharge home.   Final Clinical Impression(s) / ED Diagnoses Final diagnoses:  Rash and nonspecific skin eruption    Rx / DC Orders ED Discharge Orders     None        , PA-C 11/21/20 1339    01/21/21, MD 11/23/20 1615

## 2021-01-10 ENCOUNTER — Emergency Department (HOSPITAL_COMMUNITY)
Admission: EM | Admit: 2021-01-10 | Discharge: 2021-01-10 | Disposition: A | Discharge status: Home / Self Care | Payer: Medicare Other | Attending: Emergency Medicine | Admitting: Emergency Medicine

## 2021-01-10 ENCOUNTER — Other Ambulatory Visit: Payer: Self-pay

## 2021-01-10 ENCOUNTER — Emergency Department (HOSPITAL_COMMUNITY): Payer: Medicare Other

## 2021-01-10 DIAGNOSIS — J441 Chronic obstructive pulmonary disease with (acute) exacerbation: Secondary | ICD-10-CM | POA: Diagnosis not present

## 2021-01-10 DIAGNOSIS — J181 Lobar pneumonia, unspecified organism: Secondary | ICD-10-CM | POA: Diagnosis not present

## 2021-01-10 DIAGNOSIS — Z87891 Personal history of nicotine dependence: Secondary | ICD-10-CM | POA: Insufficient documentation

## 2021-01-10 DIAGNOSIS — R0602 Shortness of breath: Secondary | ICD-10-CM | POA: Diagnosis present

## 2021-01-10 DIAGNOSIS — Z20822 Contact with and (suspected) exposure to covid-19: Secondary | ICD-10-CM | POA: Diagnosis not present

## 2021-01-10 DIAGNOSIS — Z79899 Other long term (current) drug therapy: Secondary | ICD-10-CM | POA: Insufficient documentation

## 2021-01-10 DIAGNOSIS — J45909 Unspecified asthma, uncomplicated: Secondary | ICD-10-CM | POA: Insufficient documentation

## 2021-01-10 DIAGNOSIS — Z7951 Long term (current) use of inhaled steroids: Secondary | ICD-10-CM | POA: Diagnosis not present

## 2021-01-10 DIAGNOSIS — Z28310 Unvaccinated for covid-19: Secondary | ICD-10-CM | POA: Diagnosis not present

## 2021-01-10 DIAGNOSIS — J189 Pneumonia, unspecified organism: Secondary | ICD-10-CM

## 2021-01-10 LAB — URINALYSIS, ROUTINE W REFLEX MICROSCOPIC
Bacteria, UA: NONE SEEN
Bilirubin Urine: NEGATIVE
Glucose, UA: NEGATIVE mg/dL
Ketones, ur: NEGATIVE mg/dL
Leukocytes,Ua: NEGATIVE
Nitrite: NEGATIVE
Protein, ur: NEGATIVE mg/dL
Specific Gravity, Urine: 1.019 (ref 1.005–1.030)
pH: 6 (ref 5.0–8.0)

## 2021-01-10 LAB — BASIC METABOLIC PANEL
Anion gap: 8 (ref 5–15)
BUN: 14 mg/dL (ref 6–20)
CO2: 26 mmol/L (ref 22–32)
Calcium: 8.8 mg/dL — ABNORMAL LOW (ref 8.9–10.3)
Chloride: 97 mmol/L — ABNORMAL LOW (ref 98–111)
Creatinine, Ser: 0.66 mg/dL (ref 0.61–1.24)
GFR, Estimated: >60 mL/min (ref >60)
Glucose, Bld: 127 mg/dL — ABNORMAL HIGH (ref 70–99)
Potassium: 3.8 mmol/L (ref 3.5–5.1)
Sodium: 131 mmol/L — ABNORMAL LOW (ref 135–145)

## 2021-01-10 LAB — CBC WITH DIFFERENTIAL/PLATELET
Abs Immature Granulocytes: 0.02 10*3/uL (ref 0.00–0.07)
Basophils Absolute: 0 10*3/uL (ref 0.0–0.1)
Basophils Relative: 0 %
Eosinophils Absolute: 0.2 10*3/uL (ref 0.0–0.5)
Eosinophils Relative: 2 %
HCT: 35.4 % — ABNORMAL LOW (ref 39.0–52.0)
Hemoglobin: 12 g/dL — ABNORMAL LOW (ref 13.0–17.0)
Immature Granulocytes: 0 %
Lymphocytes Relative: 17 %
Lymphs Abs: 1.5 10*3/uL (ref 0.7–4.0)
MCH: 30.7 pg (ref 26.0–34.0)
MCHC: 33.9 g/dL (ref 30.0–36.0)
MCV: 90.5 fL (ref 80.0–100.0)
Monocytes Absolute: 1 10*3/uL (ref 0.1–1.0)
Monocytes Relative: 11 %
Neutro Abs: 6 10*3/uL (ref 1.7–7.7)
Neutrophils Relative %: 70 %
Platelets: 179 10*3/uL (ref 150–400)
RBC: 3.91 MIL/uL — ABNORMAL LOW (ref 4.22–5.81)
RDW: 13 % (ref 11.5–15.5)
WBC: 8.7 10*3/uL (ref 4.0–10.5)
nRBC: 0 % (ref 0.0–0.2)

## 2021-01-10 LAB — RAPID URINE DRUG SCREEN, HOSP PERFORMED
Amphetamines: POSITIVE — AB
Barbiturates: NOT DETECTED
Benzodiazepines: POSITIVE — AB
Cocaine: NOT DETECTED
Opiates: POSITIVE — AB
Tetrahydrocannabinol: POSITIVE — AB

## 2021-01-10 LAB — TROPONIN I (HIGH SENSITIVITY)
Troponin I (High Sensitivity): 2 ng/L (ref <18)
Troponin I (High Sensitivity): 3 ng/L (ref <18)

## 2021-01-10 LAB — RESP PANEL BY RT-PCR (FLU A&B, COVID) ARPGX2
Influenza A by PCR: NEGATIVE
Influenza B by PCR: NEGATIVE
SARS Coronavirus 2 by RT PCR: NEGATIVE

## 2021-01-10 LAB — BRAIN NATRIURETIC PEPTIDE: B Natriuretic Peptide: 34 pg/mL (ref 0.0–100.0)

## 2021-01-10 MED ORDER — ALBUTEROL SULFATE HFA 108 (90 BASE) MCG/ACT IN AERS
2.0000 | INHALATION_SPRAY | Freq: Once | RESPIRATORY_TRACT | Status: AC
  Administered 2021-01-10: 2 via RESPIRATORY_TRACT
  Filled 2021-01-10: qty 6.7

## 2021-01-10 MED ORDER — AEROCHAMBER Z-STAT PLUS/MEDIUM MISC
1.0000 | Freq: Once | Status: AC
  Administered 2021-01-10: 1
  Filled 2021-01-10: qty 1

## 2021-01-10 MED ORDER — SODIUM CHLORIDE 0.9 % IV SOLN
1.0000 g | Freq: Once | INTRAVENOUS | Status: AC
  Administered 2021-01-10: 1 g via INTRAVENOUS
  Filled 2021-01-10: qty 10

## 2021-01-10 MED ORDER — SODIUM CHLORIDE 0.9 % IV SOLN
500.0000 mg | Freq: Once | INTRAVENOUS | Status: AC
  Administered 2021-01-10: 500 mg via INTRAVENOUS
  Filled 2021-01-10: qty 500

## 2021-01-10 MED ORDER — METHYLPREDNISOLONE 4 MG PO TBPK: ORAL_TABLET | Freq: Every day | ORAL | 0 refills | Status: DC

## 2021-01-10 MED ORDER — MORPHINE SULFATE (PF) 4 MG/ML IV SOLN
4.0000 mg | Freq: Once | INTRAVENOUS | Status: AC
  Administered 2021-01-10: 4 mg via INTRAVENOUS
  Filled 2021-01-10: qty 1

## 2021-01-10 MED ORDER — ONDANSETRON HCL 4 MG/2ML IJ SOLN
4.0000 mg | Freq: Once | INTRAMUSCULAR | Status: AC
  Administered 2021-01-10: 4 mg via INTRAVENOUS
  Filled 2021-01-10: qty 2

## 2021-01-10 MED ORDER — DOXYCYCLINE HYCLATE 100 MG PO CAPS: 100.0000 mg | ORAL_CAPSULE | Freq: Two times a day (BID) | ORAL | 0 refills | Status: DC

## 2021-01-10 MED ORDER — DEXAMETHASONE SODIUM PHOSPHATE 10 MG/ML IJ SOLN
10.0000 mg | Freq: Once | INTRAMUSCULAR | Status: AC
  Administered 2021-01-10: 10 mg via INTRAVENOUS
  Filled 2021-01-10: qty 1

## 2021-01-10 NOTE — Discharge Instructions (Addendum)
Try to stop smoking. °

## 2021-01-10 NOTE — ED Triage Notes (Signed)
Reports cp x 3 days ago that was sharp and worse with movement.  Reports pain was better but became worse this morning.  Pain worse with movement and cough.  Reports coughing.  Resp even and unlabored. Reports mild sob no more than normal.

## 2021-01-10 NOTE — ED Notes (Signed)
Pt 02 dropped to 88 on ra while laying in the bed.  Placed on 2 lpm o2 via [place].  Md informed.

## 2021-01-10 NOTE — ED Notes (Signed)
Ambulated pt up and down the hall pt O2 started at 94 and sat to 93

## 2021-01-10 NOTE — ED Provider Notes (Signed)
[facility] EMERGENCY DEPARTMENT Provider Note   CSN: [id number] Arrival date & time: 01/10/21  7680     History Chief Complaint  Patient presents with   Chest Pain    [name] is a 60 y.o. male.  Pt presents to the ED today with cp.  Cp started 3 days ago.  Pt said it feels like his chest is busted open.  He has had a cough and some sob.  Pain is worse with movement.  No fevers.  He has not been vaccinated against Covid.        Past Medical History:  Diagnosis Date   Anxiety    Arthritis    Asthma    Bipolar disorder (HCC)    Chronic pain    COPD (chronic obstructive pulmonary disease) (HCC)    Degenerative disk disease    GERD (gastroesophageal reflux disease)    Hiatal hernia    History of pneumonia    PE (pulmonary embolism) 2012   Protein S deficiency [facility])     Patient Active Problem List   Diagnosis Date Noted   CAP (community acquired pneumonia) 10/17/2019   Recurrent incisional hernia    Incisional hernia, without obstruction or gangrene    Acute respiratory failure with hypoxia (HCC)    COPD exacerbation (HCC) 02/07/2015   Community acquired pneumonia 02/05/2015   Polysubstance dependence including opioid type drug, episodic abuse (HCC) 02/05/2015   Healthcare-associated pneumonia 08/17/2013   Osteoarthritis 08/17/2013   GERD (gastroesophageal reflux disease) 08/17/2013   Hx of pulmonary embolus 08/17/2013   Hepatitis B infection with hepatitis C infection 08/17/2013   COPD (chronic obstructive pulmonary disease) (HCC) 08/17/2013   Anxiety 08/17/2013   Hypokalemia 08/17/2013    Past Surgical History:  Procedure Laterality Date   CHOLECYSTECTOMY     CLOSED REDUCTION MANDIBLE WITH MANDIBULOMA  10/21/2011   Procedure: CLOSED REDUCTION MANDIBLE WITH MANDIBULOMAXILLARY FUSION;  Surgeon: Darletta Moll, MD;  Location: [facility] OR;  Service: ENT;  Laterality: N/A;  MMF screws   HAND SURGERY Right 20 years ago   INCISIONAL HERNIA REPAIR N/A 10/27/2018    Procedure: HERNIA REPAIR INCISIONAL WITH MESH;  Surgeon: Franky Macho, MD;  Location: [facility] OR[facility];  Service: General;  Laterality: N/A;   INCISIONAL HERNIA REPAIR N/A 02/23/2019   Procedure: LAPAROSCOPIC INCISIONAL HERNIA REPAIR WITH MESH;  Surgeon: Franky Macho, MD;  Location: [facility] OR[facility];  Service: General;  Laterality: N/A;   LEG SURGERY     MANDIBULAR HARDWARE REMOVAL  11/11/2011   Procedure: MANDIBULAR HARDWARE REMOVAL;  Surgeon: Darletta Moll, MD;  Location: [facility];  Service: ENT;  Laterality: N/A;   ORIF MANDIBULAR FRACTURE  10/21/2011   Procedure: OPEN REDUCTION INTERNAL FIXATION (ORIF) MANDIBULAR FRACTURE;  Surgeon: Darletta Moll, MD;  Location: [facility] OR;  Service: ENT;  Laterality: N/A;   UMBILICAL HERNIA REPAIR         Family History  Problem Relation Age of Onset   Coronary artery disease Mother    Coronary artery disease Father    Diabetes Other     Social History   Tobacco Use   Smoking status: Former    Packs/day: 0.25    Years: 35.00    Pack years: 8.75    Types: Cigarettes   Smokeless tobacco: Never  Vaping Use   Vaping Use: Every day  Substance Use Topics   Alcohol use: No    Alcohol/week: 0.0 standard drinks   Drug use: Yes    Comment:  suboxone, adderall, ativan, meth, xanaa    Home Medications Prior to Admission medications   Medication Sig Start Date End Date Taking? Authorizing Provider  doxycycline (VIBRAMYCIN) 100 MG capsule Take 1 capsule (100 mg total) by mouth 2 (two) times daily. 01/10/21  Yes Jacalyn Lefevre, MD  albuterol (PROVENTIL HFA;VENTOLIN HFA) 108 (90 BASE) MCG/ACT inhaler Inhale 2 puffs into the lungs every 4 (four) hours as needed for wheezing or shortness of breath.     [provider]  ALPRAZolam Prudy Feeler) 0.5 MG tablet Take 0.5 mg by mouth 2 (two) times daily. 10/10/19   [provider]  amphetamine-dextroamphetamine (ADDERALL) 20 MG tablet Take 20 mg by mouth 2 (two) times daily. 01/17/19   [provider]   atorvastatin (LIPITOR) 10 MG tablet Take 10 mg by mouth daily.  09/21/19   [provider]  budesonide-formoterol (SYMBICORT) 80-4.5 MCG/ACT inhaler Inhale 2 puffs into the lungs 2 (two) times daily. 06/23/19   [provider]  busPIRone (BUSPAR) 5 MG tablet Take 5 mg by mouth 2 (two) times daily.  09/21/19   [provider]  gabapentin (NEURONTIN) 300 MG capsule Take 1 capsule (300 mg total) by mouth 3 (three) times daily. 10/27/18 10/27/19  Franky Macho, MD  guaiFENesin-dextromethorphan (ROBITUSSIN DM) 100-10 MG/5ML syrup Take 5 mLs by mouth every 4 (four) hours as needed for cough. 10/19/19   Johnson, Clanford L, MD  hydrOXYzine (ATARAX/VISTARIL) 25 MG tablet Take 1 tablet (25 mg total) by mouth every 6 (six) hours. May cause drowsiness 11/21/20   Triplett, Tammy, PA-C  meclizine (ANTIVERT) 12.5 MG tablet Take 12.5 mg by mouth 3 (three) times daily as needed for dizziness.    [provider]  Milk Thistle 1000 MG CAPS Take 1,000 mg by mouth daily.     [provider]  omeprazole (PRILOSEC) 40 MG capsule Take 40 mg by mouth daily.    [provider]  predniSONE (DELTASONE) 10 MG tablet Take 6 tablets day one, 5 tablets day two, 4 tablets day three, 3 tablets day four, 2 tablets day five, then 1 tablet day six 11/21/20   Triplett, Tammy, PA-C  promethazine (PHENERGAN) 25 MG tablet Take 25 mg by mouth every 6 (six) hours as needed for nausea or vomiting.    [provider]  QUEtiapine (SEROQUEL) 100 MG tablet Take 100 mg by mouth at bedtime.  08/04/13   [provider]  Rivaroxaban (XARELTO) 20 MG TABS Take 20 mg by mouth daily.    [provider]  SUBOXONE 8-2 MG FILM Take 1 Film by mouth in the morning, at noon, and at bedtime.  02/14/19   [provider]  triamcinolone cream (KENALOG) 0.5 % Apply 1 application topically 2 (two) times daily as needed.  10/11/19   [provider]  venlafaxine XR (EFFEXOR-XR) 150  MG 24 hr capsule Take 150 mg by mouth daily with breakfast.     [provider]    Allergies    Patient has no known allergies.  Review of Systems   Review of Systems  Respiratory:  Positive for cough and shortness of breath.   Cardiovascular:  Positive for chest pain.  All other systems reviewed and are negative.  Physical Exam Updated Vital Signs BP (!) 135/97   Pulse 85   Temp 99.7 F (37.6 C)   Resp (!) 25   Ht 6\' 3"  (1.905 m)   Wt 96.2 kg   SpO2 97%   BMI 26.50 kg/m  Physical Exam Vitals and nursing note reviewed.  Constitutional:      Appearance: He is well-developed.  HENT:     Head: Normocephalic and atraumatic.  Eyes:     Extraocular Movements: Extraocular movements intact.     Pupils: Pupils are equal, round, and reactive to light.  Cardiovascular:     Rate and Rhythm: Normal rate and regular rhythm.     Heart sounds: Normal heart sounds.  Pulmonary:     Effort: Pulmonary effort is normal.     Breath sounds: Wheezing present.  Chest:     Chest wall: Tenderness present.  Abdominal:     General: Bowel sounds are normal.     Palpations: Abdomen is soft.  Musculoskeletal:        General: Normal range of motion.     Cervical back: Normal range of motion and neck supple.  Skin:    General: Skin is warm and dry.     Capillary Refill: Capillary refill takes less than 2 seconds.  Neurological:     General: No focal deficit present.     Mental Status: He is alert and oriented to person, place, and time.  Psychiatric:        Mood and Affect: Mood normal.        Behavior: Behavior normal.    ED Results / Procedures / Treatments   Labs (all labs ordered are listed, but only abnormal results are displayed) Labs Reviewed  BASIC METABOLIC PANEL - Abnormal; Notable for the following components:      Result Value   Sodium 131 (*)    Chloride 97 (*)    Glucose, Bld 127 (*)    Calcium 8.8 (*)    All other components within normal limits  CBC WITH  DIFFERENTIAL/PLATELET - Abnormal; Notable for the following components:   RBC 3.91 (*)    Hemoglobin 12.0 (*)    HCT 35.4 (*)    All other components within normal limits  RAPID URINE DRUG SCREEN, HOSP PERFORMED - Abnormal; Notable for the following components:   Opiates POSITIVE (*)    Benzodiazepines POSITIVE (*)    Amphetamines POSITIVE (*)    Tetrahydrocannabinol POSITIVE (*)    All other components within normal limits  RESP PANEL BY RT-PCR (FLU A&B, COVID) ARPGX2  BRAIN NATRIURETIC PEPTIDE  URINALYSIS, ROUTINE W REFLEX MICROSCOPIC  TROPONIN I (HIGH SENSITIVITY)  TROPONIN I (HIGH SENSITIVITY)    EKG EKG Interpretation  Date/Time:  Thursday January 10 2021 07:13:57 EDT Ventricular Rate:  91 PR Interval:  140 QRS Duration: 104 QT Interval:  348 QTC Calculation: 429 R Axis:   72 Text Interpretation: Sinus rhythm No significant change since last tracing Confirmed by Jacalyn Lefevre [phone]) on 01/10/2021 7:48:12 AM  Radiology DG Chest Port 1 View  Result Date: 01/10/2021 CLINICAL DATA:  Chest pain for 3 days EXAM: PORTABLE CHEST 1 VIEW COMPARISON:  12/09/2019 FINDINGS: New left upper lobe opacity reaching the hilum. No detected volume loss or change in mediastinal contours. Indistinct left lower lobe airspace disease. No edema, effusion, or pneumothorax. No cardiomegaly. IMPRESSION: Left upper and lower airspace disease compatible with pneumonia in the appropriate clinical setting. Followup PA and lateral chest X-ray is recommended in 3-4 weeks following trial of antibiotic therapy to ensure resolution and exclude underlying malignancy. Electronically Signed   By: Tiburcio Pea M.D.   On: 01/10/2021 07:55    Procedures Procedures   Medications Ordered in ED Medications  azithromycin (ZITHROMAX) 500 mg in sodium  chloride 0.9 % 250 mL IVPB (500 mg Intravenous New Bag/Given 01/10/21 0851)  morphine 4 MG/ML injection 4 mg (has no administration in time range)  morphine 4 MG/ML  injection 4 mg (4 mg Intravenous Given 01/10/21 0750)  ondansetron (ZOFRAN) injection 4 mg (4 mg Intravenous Given 01/10/21 0750)  dexamethasone (DECADRON) injection 10 mg (10 mg Intravenous Given 01/10/21 0751)  albuterol (VENTOLIN HFA) 108 (90 Base) MCG/ACT inhaler 2 puff (2 puffs Inhalation Given 01/10/21 0750)  aerochamber Z-Stat Plus/medium 1 each (1 each Other Given 01/10/21 0755)  cefTRIAXone (ROCEPHIN) 1 g in sodium chloride 0.9 % 100 mL IVPB (1 g Intravenous New Bag/Given 01/10/21 0816)    ED Course  I have reviewed the triage vital signs and the nursing notes.  Pertinent labs & imaging results that were available during my care of the patient were reviewed by me and considered in my medical decision making (see chart for details).    MDM Rules/Calculators/A&P                           Pt does have LUL and LLL pna.  He is given a dose of rocephin/zithromax.  His O2 sat did drop to 88% before the pain medication and treatment.  He was put on 2L oxygen by [place] briefly.  After meds, he is feeling better and O2 sat is better.  He was able to ambulate with O2 sats staying in the mid-90s.  He is feeling much better.  He wants to go home.  He is d/c with doxy.  He knows to return if worse.   Covid neg.  [name] was evaluated in Emergency Department on 01/10/2021 for the symptoms described in the history of present illness. He was evaluated in the context of the global COVID-19 pandemic, which necessitated consideration that the patient might be at risk for infection with the SARS-CoV-2 virus that causes COVID-19. Institutional protocols and algorithms that pertain to the evaluation of patients at risk for COVID-19 are in a state of rapid change based on information released by regulatory bodies including the CDC and federal and state organizations. These policies and algorithms were followed during the patient's care in the ED.    Final Clinical Impression(s) / ED Diagnoses Final diagnoses:   Community acquired pneumonia of left lower lobe of lung  Community acquired pneumonia of left upper lobe of lung    Rx / DC Orders ED Discharge Orders          Ordered    doxycycline (VIBRAMYCIN) 100 MG capsule  2 times daily        01/10/21 0932             Jacalyn Lefevre, MD 01/10/21 [phone]

## 2021-01-22 ENCOUNTER — Encounter (HOSPITAL_COMMUNITY): Payer: Self-pay | Admitting: *Deleted

## 2021-01-22 ENCOUNTER — Emergency Department (HOSPITAL_COMMUNITY): Payer: Medicare Other

## 2021-01-22 ENCOUNTER — Other Ambulatory Visit: Payer: Self-pay

## 2021-01-22 ENCOUNTER — Inpatient Hospital Stay (HOSPITAL_COMMUNITY)
Admission: EM | Admit: 2021-01-22 | Discharge: 2021-01-24 | DRG: 193 | Disposition: A | Discharge status: Home / Self Care | Payer: Medicare Other | Attending: Family Medicine | Admitting: Family Medicine

## 2021-01-22 DIAGNOSIS — D649 Anemia, unspecified: Secondary | ICD-10-CM | POA: Diagnosis present

## 2021-01-22 DIAGNOSIS — J984 Other disorders of lung: Secondary | ICD-10-CM

## 2021-01-22 DIAGNOSIS — M1990 Unspecified osteoarthritis, unspecified site: Secondary | ICD-10-CM | POA: Diagnosis present

## 2021-01-22 DIAGNOSIS — Z7951 Long term (current) use of inhaled steroids: Secondary | ICD-10-CM

## 2021-01-22 DIAGNOSIS — J440 Chronic obstructive pulmonary disease with acute lower respiratory infection: Secondary | ICD-10-CM | POA: Diagnosis present

## 2021-01-22 DIAGNOSIS — Z8249 Family history of ischemic heart disease and other diseases of the circulatory system: Secondary | ICD-10-CM

## 2021-01-22 DIAGNOSIS — J850 Gangrene and necrosis of lung: Secondary | ICD-10-CM

## 2021-01-22 DIAGNOSIS — Z20822 Contact with and (suspected) exposure to covid-19: Secondary | ICD-10-CM | POA: Diagnosis present

## 2021-01-22 DIAGNOSIS — K219 Gastro-esophageal reflux disease without esophagitis: Secondary | ICD-10-CM | POA: Diagnosis present

## 2021-01-22 DIAGNOSIS — Z833 Family history of diabetes mellitus: Secondary | ICD-10-CM

## 2021-01-22 DIAGNOSIS — R918 Other nonspecific abnormal finding of lung field: Secondary | ICD-10-CM | POA: Diagnosis present

## 2021-01-22 DIAGNOSIS — J9601 Acute respiratory failure with hypoxia: Secondary | ICD-10-CM | POA: Diagnosis not present

## 2021-01-22 DIAGNOSIS — F319 Bipolar disorder, unspecified: Secondary | ICD-10-CM | POA: Diagnosis present

## 2021-01-22 DIAGNOSIS — Z7901 Long term (current) use of anticoagulants: Secondary | ICD-10-CM

## 2021-01-22 DIAGNOSIS — J189 Pneumonia, unspecified organism: Secondary | ICD-10-CM | POA: Diagnosis not present

## 2021-01-22 DIAGNOSIS — F419 Anxiety disorder, unspecified: Secondary | ICD-10-CM | POA: Diagnosis present

## 2021-01-22 DIAGNOSIS — G8929 Other chronic pain: Secondary | ICD-10-CM | POA: Diagnosis present

## 2021-01-22 DIAGNOSIS — Z720 Tobacco use: Secondary | ICD-10-CM

## 2021-01-22 DIAGNOSIS — Z86711 Personal history of pulmonary embolism: Secondary | ICD-10-CM

## 2021-01-22 DIAGNOSIS — F17210 Nicotine dependence, cigarettes, uncomplicated: Secondary | ICD-10-CM | POA: Diagnosis present

## 2021-01-22 DIAGNOSIS — Z79899 Other long term (current) drug therapy: Secondary | ICD-10-CM

## 2021-01-22 DIAGNOSIS — R0902 Hypoxemia: Secondary | ICD-10-CM

## 2021-01-22 LAB — FOLATE: Folate: 30.6 ng/mL (ref >5.9)

## 2021-01-22 LAB — CBC WITH DIFFERENTIAL/PLATELET
Abs Immature Granulocytes: 0.03 10*3/uL (ref 0.00–0.07)
Basophils Absolute: 0 10*3/uL (ref 0.0–0.1)
Basophils Relative: 0 %
Eosinophils Absolute: 0.1 10*3/uL (ref 0.0–0.5)
Eosinophils Relative: 2 %
HCT: 33.6 % — ABNORMAL LOW (ref 39.0–52.0)
Hemoglobin: 11.4 g/dL — ABNORMAL LOW (ref 13.0–17.0)
Immature Granulocytes: 0 %
Lymphocytes Relative: 16 %
Lymphs Abs: 1.3 10*3/uL (ref 0.7–4.0)
MCH: 30.6 pg (ref 26.0–34.0)
MCHC: 33.9 g/dL (ref 30.0–36.0)
MCV: 90.1 fL (ref 80.0–100.0)
Monocytes Absolute: 0.6 10*3/uL (ref 0.1–1.0)
Monocytes Relative: 8 %
Neutro Abs: 5.7 10*3/uL (ref 1.7–7.7)
Neutrophils Relative %: 74 %
Platelets: 220 10*3/uL (ref 150–400)
RBC: 3.73 MIL/uL — ABNORMAL LOW (ref 4.22–5.81)
RDW: 13.1 % (ref 11.5–15.5)
WBC: 7.7 10*3/uL (ref 4.0–10.5)
nRBC: 0 % (ref 0.0–0.2)

## 2021-01-22 LAB — BASIC METABOLIC PANEL
Anion gap: 6 (ref 5–15)
BUN: 12 mg/dL (ref 6–20)
CO2: 29 mmol/L (ref 22–32)
Calcium: 8.6 mg/dL — ABNORMAL LOW (ref 8.9–10.3)
Chloride: 100 mmol/L (ref 98–111)
Creatinine, Ser: 0.72 mg/dL (ref 0.61–1.24)
GFR, Estimated: >60 mL/min (ref >60)
Glucose, Bld: 141 mg/dL — ABNORMAL HIGH (ref 70–99)
Potassium: 3.6 mmol/L (ref 3.5–5.1)
Sodium: 135 mmol/L (ref 135–145)

## 2021-01-22 LAB — IRON AND TIBC
Iron: 21 ug/dL — ABNORMAL LOW (ref 45–182)
Saturation Ratios: 9 % — ABNORMAL LOW (ref 17.9–39.5)
TIBC: 228 ug/dL — ABNORMAL LOW (ref 250–450)
UIBC: 207 ug/dL

## 2021-01-22 LAB — HIV ANTIBODY (ROUTINE TESTING W REFLEX): HIV Screen 4th Generation wRfx: NONREACTIVE

## 2021-01-22 LAB — RESP PANEL BY RT-PCR (FLU A&B, COVID) ARPGX2
Influenza A by PCR: NEGATIVE
Influenza B by PCR: NEGATIVE
SARS Coronavirus 2 by RT PCR: NEGATIVE

## 2021-01-22 LAB — RETICULOCYTES
Immature Retic Fract: 11 % (ref 2.3–15.9)
RBC.: 3.75 MIL/uL — ABNORMAL LOW (ref 4.22–5.81)
Retic Count, Absolute: 38.3 10*3/uL (ref 19.0–186.0)
Retic Ct Pct: 1 % (ref 0.4–3.1)

## 2021-01-22 LAB — FERRITIN: Ferritin: 68 ng/mL (ref 24–336)

## 2021-01-22 LAB — D-DIMER, QUANTITATIVE: D-Dimer, Quant: 0.6 ug/mL-FEU — ABNORMAL HIGH (ref 0.00–0.50)

## 2021-01-22 LAB — TROPONIN I (HIGH SENSITIVITY)
Troponin I (High Sensitivity): <2 ng/L (ref <18)
Troponin I (High Sensitivity): 3 ng/L (ref <18)

## 2021-01-22 LAB — BRAIN NATRIURETIC PEPTIDE: B Natriuretic Peptide: 34 pg/mL (ref 0.0–100.0)

## 2021-01-22 LAB — VITAMIN B12: Vitamin B-12: 1614 pg/mL — ABNORMAL HIGH (ref 180–914)

## 2021-01-22 MED ORDER — IOHEXOL 350 MG/ML SOLN
100.0000 mL | Freq: Once | INTRAVENOUS | Status: AC | PRN
  Administered 2021-01-22: 100 mL via INTRAVENOUS

## 2021-01-22 MED ORDER — PANTOPRAZOLE SODIUM 40 MG PO TBEC
40.0000 mg | DELAYED_RELEASE_TABLET | Freq: Every day | ORAL | Status: DC
  Administered 2021-01-22 – 2021-01-24 (×3): 40 mg via ORAL
  Filled 2021-01-22 (×3): qty 1

## 2021-01-22 MED ORDER — ENOXAPARIN SODIUM 40 MG/0.4ML IJ SOSY: 40.0000 mg | PREFILLED_SYRINGE | INTRAMUSCULAR | Status: DC

## 2021-01-22 MED ORDER — VANCOMYCIN HCL 1750 MG/350ML IV SOLN
1750.0000 mg | Freq: Two times a day (BID) | INTRAVENOUS | Status: DC
  Administered 2021-01-23: 1750 mg via INTRAVENOUS
  Filled 2021-01-22 (×4): qty 350

## 2021-01-22 MED ORDER — GUAIFENESIN-DM 100-10 MG/5ML PO SYRP: 5.0000 mL | ORAL_SOLUTION | ORAL | Status: DC | PRN

## 2021-01-22 MED ORDER — MOMETASONE FURO-FORMOTEROL FUM 100-5 MCG/ACT IN AERO
2.0000 | INHALATION_SPRAY | Freq: Two times a day (BID) | RESPIRATORY_TRACT | Status: DC
  Administered 2021-01-22 – 2021-01-24 (×4): 2 via RESPIRATORY_TRACT
  Filled 2021-01-22: qty 8.8

## 2021-01-22 MED ORDER — ONDANSETRON HCL 4 MG/2ML IJ SOLN
4.0000 mg | Freq: Once | INTRAMUSCULAR | Status: AC
  Administered 2021-01-22: 4 mg via INTRAVENOUS
  Filled 2021-01-22: qty 2

## 2021-01-22 MED ORDER — IPRATROPIUM-ALBUTEROL 0.5-2.5 (3) MG/3ML IN SOLN: 3.0000 mL | Freq: Four times a day (QID) | RESPIRATORY_TRACT | Status: DC | PRN

## 2021-01-22 MED ORDER — ONDANSETRON HCL 4 MG PO TABS: 4.0000 mg | ORAL_TABLET | Freq: Four times a day (QID) | ORAL | Status: DC | PRN

## 2021-01-22 MED ORDER — ATORVASTATIN CALCIUM 10 MG PO TABS
10.0000 mg | ORAL_TABLET | Freq: Every day | ORAL | Status: DC
  Administered 2021-01-22 – 2021-01-24 (×3): 10 mg via ORAL
  Filled 2021-01-22 (×3): qty 1

## 2021-01-22 MED ORDER — MORPHINE SULFATE (PF) 4 MG/ML IV SOLN
4.0000 mg | Freq: Once | INTRAVENOUS | Status: AC
Start: 2021-01-22 — End: 2021-01-22
  Administered 2021-01-22: 4 mg via INTRAVENOUS
  Filled 2021-01-22: qty 1

## 2021-01-22 MED ORDER — AMPHETAMINE-DEXTROAMPHETAMINE 10 MG PO TABS
20.0000 mg | ORAL_TABLET | Freq: Three times a day (TID) | ORAL | Status: DC
  Administered 2021-01-22 – 2021-01-24 (×5): 20 mg via ORAL
  Filled 2021-01-22 (×5): qty 2

## 2021-01-22 MED ORDER — ACETAMINOPHEN 650 MG RE SUPP: 650.0000 mg | Freq: Four times a day (QID) | RECTAL | Status: DC | PRN

## 2021-01-22 MED ORDER — RIVAROXABAN 20 MG PO TABS
20.0000 mg | ORAL_TABLET | Freq: Every day | ORAL | Status: DC
  Administered 2021-01-22 – 2021-01-23 (×2): 20 mg via ORAL
  Filled 2021-01-22 (×2): qty 1

## 2021-01-22 MED ORDER — SODIUM CHLORIDE 0.9 % IV SOLN: 250.0000 mL | INTRAVENOUS | Status: DC | PRN

## 2021-01-22 MED ORDER — SODIUM CHLORIDE 0.9 % IV SOLN
2.0000 g | Freq: Three times a day (TID) | INTRAVENOUS | Status: DC
  Administered 2021-01-22 – 2021-01-23 (×2): 2 g via INTRAVENOUS
  Filled 2021-01-22 (×3): qty 2

## 2021-01-22 MED ORDER — MECLIZINE HCL 12.5 MG PO TABS
12.5000 mg | ORAL_TABLET | Freq: Three times a day (TID) | ORAL | Status: DC | PRN
  Administered 2021-01-22: 12.5 mg via ORAL
  Filled 2021-01-22: qty 1

## 2021-01-22 MED ORDER — BUPRENORPHINE HCL-NALOXONE HCL 8-2 MG SL SUBL
1.0000 | SUBLINGUAL_TABLET | Freq: Every day | SUBLINGUAL | Status: DC
Start: 2021-01-22 — End: 2021-01-24
  Administered 2021-01-22 – 2021-01-24 (×3): 1 via SUBLINGUAL
  Filled 2021-01-22 (×3): qty 1

## 2021-01-22 MED ORDER — VANCOMYCIN HCL 2000 MG/400ML IV SOLN
2000.0000 mg | Freq: Once | INTRAVENOUS | Status: AC
  Administered 2021-01-22: 2000 mg via INTRAVENOUS
  Filled 2021-01-22: qty 400

## 2021-01-22 MED ORDER — SODIUM CHLORIDE 0.9% FLUSH
3.0000 mL | Freq: Two times a day (BID) | INTRAVENOUS | Status: DC
  Administered 2021-01-22 – 2021-01-23 (×2): 3 mL via INTRAVENOUS

## 2021-01-22 MED ORDER — ACETAMINOPHEN 325 MG PO TABS
650.0000 mg | ORAL_TABLET | Freq: Four times a day (QID) | ORAL | Status: DC | PRN
  Administered 2021-01-22: 650 mg via ORAL
  Filled 2021-01-22: qty 2

## 2021-01-22 MED ORDER — SODIUM CHLORIDE 0.9% FLUSH: 3.0000 mL | INTRAVENOUS | Status: DC | PRN

## 2021-01-22 MED ORDER — ENSURE ENLIVE PO LIQD
237.0000 mL | Freq: Two times a day (BID) | ORAL | Status: DC
  Administered 2021-01-23 (×2): 237 mL via ORAL
  Filled 2021-01-22: qty 237

## 2021-01-22 MED ORDER — GABAPENTIN 300 MG PO CAPS
300.0000 mg | ORAL_CAPSULE | Freq: Three times a day (TID) | ORAL | Status: DC
  Administered 2021-01-22 – 2021-01-24 (×6): 300 mg via ORAL
  Filled 2021-01-22 (×6): qty 1

## 2021-01-22 MED ORDER — ONDANSETRON HCL 4 MG/2ML IJ SOLN
4.0000 mg | Freq: Four times a day (QID) | INTRAMUSCULAR | Status: DC | PRN
  Administered 2021-01-22: 4 mg via INTRAVENOUS
  Filled 2021-01-22: qty 2

## 2021-01-22 MED ORDER — SODIUM CHLORIDE 0.9 % IV SOLN
2.0000 g | Freq: Once | INTRAVENOUS | Status: AC
  Administered 2021-01-22: 2 g via INTRAVENOUS
  Filled 2021-01-22: qty 2

## 2021-01-22 NOTE — ED Triage Notes (Signed)
/  o severe chest pain, states he was diagnosed with pneumonia and does not think his symptoms are resolving

## 2021-01-22 NOTE — ED Notes (Signed)
Urina given l

## 2021-01-22 NOTE — Progress Notes (Signed)
Pharmacy Antibiotic Note  [name] is a 60 y.o. male admitted on 01/22/2021 with pneumonia.  Pharmacy has been consulted for Vancomycin and Cefepime dosing.  Plan: Vancomycin 2000mg  IV loading dose, then 1750mg  IV q12h for AUC 512 Cefepime 2gm IV q8h F/U cxs and clinical progress Monitor V/S, labs and levels as indicated  Height: 6\' 2"  (188 cm) Weight: 92.5 kg (203 lb 14.8 oz) IBW/kg (Calculated) : 82.2  Temp (24hrs), Avg:98 F (36.7 C), Min:97.9 F (36.6 C), Max:98.1 F (36.7 C)  Recent Labs  Lab 01/22/21 1220  WBC 7.7  CREATININE 0.72    Estimated Creatinine Clearance: 117 mL/min (by C-G formula based on SCr of 0.72 mg/dL).    No Known Allergies  Antimicrobials this admission: Vancomycin  10/11 >>  Cefepime  10/11 >>   Microbiology results: 10/11 BCx: pending 10/11 UCx: pending   MRSA PCR:   Thank you for allowing pharmacy to be a part of this patient's care.  12/11, BS 12/11, 12/11 Clinical Pharmacist Pager [phone] 01/22/2021 4:34 PM

## 2021-01-22 NOTE — ED Notes (Signed)
Attempted report x1. 

## 2021-01-22 NOTE — ED Provider Notes (Signed)
[facility] EMERGENCY DEPARTMENT Provider Note   CSN: [id number] Arrival date & time: 01/22/21  7948     History Chief Complaint  Patient presents with   Chest Pain    [name] is a 60 y.o. male.  Pt presents to the ED today with cp.  Pt was seen by me on 9/29 and diagnosed with CAP.  He was given rocephin/zithromax in the ED and d/c with doxy.  Pt said he thought he was getting better and now he is feeling worse.  His pain is also back.  Pt denies any f/c.        Past Medical History:  Diagnosis Date   Anxiety    Arthritis    Asthma    Bipolar disorder (HCC)    Chronic pain    COPD (chronic obstructive pulmonary disease) (HCC)    Degenerative disk disease    GERD (gastroesophageal reflux disease)    Hiatal hernia    History of pneumonia    PE (pulmonary embolism) 2012   Protein S deficiency [facility])     Patient Active Problem List   Diagnosis Date Noted   Acute hypoxemic respiratory failure (HCC) 01/22/2021   CAP (community acquired pneumonia) 10/17/2019   Recurrent incisional hernia    Incisional hernia, without obstruction or gangrene    Acute respiratory failure with hypoxia (HCC)    COPD exacerbation (HCC) 02/07/2015   Community acquired pneumonia 02/05/2015   Polysubstance dependence including opioid type drug, episodic abuse (HCC) 02/05/2015   Healthcare-associated pneumonia 08/17/2013   Osteoarthritis 08/17/2013   GERD (gastroesophageal reflux disease) 08/17/2013   Hx of pulmonary embolus 08/17/2013   Hepatitis B infection with hepatitis C infection 08/17/2013   COPD (chronic obstructive pulmonary disease) (HCC) 08/17/2013   Anxiety 08/17/2013   Hypokalemia 08/17/2013    Past Surgical History:  Procedure Laterality Date   CHOLECYSTECTOMY     CLOSED REDUCTION MANDIBLE WITH MANDIBULOMA  10/21/2011   Procedure: CLOSED REDUCTION MANDIBLE WITH MANDIBULOMAXILLARY FUSION;  Surgeon: Darletta Moll, MD;  Location: [facility] OR;  Service: ENT;  Laterality: N/A;  MMF  screws   HAND SURGERY Right 20 years ago   INCISIONAL HERNIA REPAIR N/A 10/27/2018   Procedure: HERNIA REPAIR INCISIONAL WITH MESH;  Surgeon: Franky Macho, MD;  Location: [facility] OR[facility];  Service: General;  Laterality: N/A;   INCISIONAL HERNIA REPAIR N/A 02/23/2019   Procedure: LAPAROSCOPIC INCISIONAL HERNIA REPAIR WITH MESH;  Surgeon: Franky Macho, MD;  Location: [facility] OR[facility];  Service: General;  Laterality: N/A;   LEG SURGERY     MANDIBULAR HARDWARE REMOVAL  11/11/2011   Procedure: MANDIBULAR HARDWARE REMOVAL;  Surgeon: Darletta Moll, MD;  Location: [facility];  Service: ENT;  Laterality: N/A;   ORIF MANDIBULAR FRACTURE  10/21/2011   Procedure: OPEN REDUCTION INTERNAL FIXATION (ORIF) MANDIBULAR FRACTURE;  Surgeon: Darletta Moll, MD;  Location: [facility] OR;  Service: ENT;  Laterality: N/A;   UMBILICAL HERNIA REPAIR         Family History  Problem Relation Age of Onset   Coronary artery disease Mother    Coronary artery disease Father    Diabetes Other     Social History   Tobacco Use   Smoking status: Former    Packs/day: 0.25    Years: 35.00    Pack years: 8.75    Types: Cigarettes   Smokeless tobacco: Never  Vaping Use   Vaping Use: Every day  Substance Use Topics   Alcohol use: No  Alcohol/week: 0.0 standard drinks   Drug use: Yes    Comment: suboxone, adderall, ativan, meth, xanaa    Home Medications Prior to Admission medications   Medication Sig Start Date End Date Taking? Authorizing Provider  albuterol (PROVENTIL HFA;VENTOLIN HFA) 108 (90 BASE) MCG/ACT inhaler Inhale 2 puffs into the lungs every 4 (four) hours as needed for wheezing or shortness of breath.    Yes [provider]  amphetamine-dextroamphetamine (ADDERALL) 20 MG tablet Take 20 mg by mouth 3 (three) times daily. 01/17/19  Yes [provider]  gabapentin (NEURONTIN) 300 MG capsule Take 1 capsule (300 mg total) by mouth 3 (three) times daily. 10/27/18 01/22/21 Yes Franky Macho, MD  meclizine  (ANTIVERT) 12.5 MG tablet Take 12.5 mg by mouth 3 (three) times daily as needed for dizziness.   Yes [provider]  Milk Thistle 1000 MG CAPS Take 1,000 mg by mouth daily.    Yes [provider]  omeprazole (PRILOSEC) 40 MG capsule Take 40 mg by mouth daily.   Yes [provider]  rivaroxaban (XARELTO) 20 MG TABS tablet Take 20 mg by mouth daily.   Yes [provider]  SUBOXONE 8-2 MG FILM Take 1 Film by mouth in the morning, at noon, and at bedtime.  02/14/19  Yes [provider]  triamcinolone cream (KENALOG) 0.5 % Apply 1 application topically 2 (two) times daily as needed.  10/11/19  Yes [provider]  ALPRAZolam Prudy Feeler) 0.5 MG tablet Take 0.5 mg by mouth 2 (two) times daily. Patient not taking: No sig reported 10/10/19   [provider]  atorvastatin (LIPITOR) 10 MG tablet Take 10 mg by mouth daily.  Patient not taking: No sig reported 09/21/19   [provider]  budesonide-formoterol (SYMBICORT) 80-4.5 MCG/ACT inhaler Inhale 2 puffs into the lungs 2 (two) times daily. Patient not taking: No sig reported 06/23/19   [provider]  busPIRone (BUSPAR) 5 MG tablet Take 5 mg by mouth 2 (two) times daily.  Patient not taking: No sig reported 09/21/19   [provider]  doxycycline (VIBRAMYCIN) 100 MG capsule Take 1 capsule (100 mg total) by mouth 2 (two) times daily. Patient not taking: No sig reported 01/10/21   Jacalyn Lefevre, MD  guaiFENesin-dextromethorphan [facility] DM) 100-10 MG/5ML syrup Take 5 mLs by mouth every 4 (four) hours as needed for cough. Patient not taking: No sig reported 10/19/19   Cleora Fleet, MD  hydrOXYzine (ATARAX/VISTARIL) 25 MG tablet Take 1 tablet (25 mg total) by mouth every 6 (six) hours. May cause drowsiness Patient not taking: No sig reported 11/21/20   Triplett, Tammy, PA-C  methylPREDNISolone (MEDROL DOSEPAK) 4 MG TBPK tablet Take by mouth daily. Day 1:  2 pills at  breakfast, 1 pill at lunch, 1 pill after supper, 2 pills at bedtime;Day 2:  1 pill at breakfast, 1 pill at lunch, 1 pill after supper, 2 pills at bedtime;Day 3:  1 pill at breakfast, 1 pill at lunch, 1 pill after supper, 1 pill at bedtime;Day 4:  1 pill at breakfast, 1 pill at lunch, 1 pill at bedtime;Day 5:  1 pill at breakfast, 1 pill at bedtime;Day 6:  1 pill at breakfast Patient not taking: No sig reported 01/10/21   Jacalyn Lefevre, MD  predniSONE (DELTASONE) 10 MG tablet Take 6 tablets day one, 5 tablets day two, 4 tablets day three, 3 tablets day four, 2 tablets day five, then 1 tablet day six Patient not taking: No sig reported  11/21/20   Triplett, Tammy, PA-C  promethazine (PHENERGAN) 25 MG tablet Take 25 mg by mouth every 6 (six) hours as needed for nausea or vomiting. Patient not taking: No sig reported    [provider]    Allergies    Patient has no known allergies.  Review of Systems   Review of Systems  Respiratory:  Positive for cough.   Cardiovascular:  Positive for chest pain.  All other systems reviewed and are negative.  Physical Exam Updated Vital Signs BP 108/65   Pulse 66   Temp 98.1 F (36.7 C) (Oral)   Resp (!) 23   Ht 6\' 2"  (1.88 m)   Wt 92.5 kg   SpO2 97%   BMI 26.18 kg/m   Physical Exam Vitals and nursing note reviewed.  Constitutional:      Appearance: He is well-developed.  HENT:     Head: Normocephalic and atraumatic.  Eyes:     Extraocular Movements: Extraocular movements intact.     Pupils: Pupils are equal, round, and reactive to light.  Cardiovascular:     Rate and Rhythm: Normal rate and regular rhythm.     Heart sounds: Normal heart sounds.  Pulmonary:     Effort: Pulmonary effort is normal.     Breath sounds: Wheezing and rhonchi present.  Abdominal:     General: Bowel sounds are normal.     Palpations: Abdomen is soft.  Musculoskeletal:        General: Normal range of motion.     Cervical back: Normal range of motion and  neck supple.  Skin:    General: Skin is warm and dry.     Capillary Refill: Capillary refill takes less than 2 seconds.  Neurological:     General: No focal deficit present.     Mental Status: He is alert and oriented to person, place, and time.  Psychiatric:        Mood and Affect: Mood normal.        Behavior: Behavior normal.    ED Results / Procedures / Treatments   Labs (all labs ordered are listed, but only abnormal results are displayed) Labs Reviewed  BASIC METABOLIC PANEL - Abnormal; Notable for the following components:      Result Value   Glucose, Bld 141 (*)    Calcium 8.6 (*)    All other components within normal limits  CBC WITH DIFFERENTIAL/PLATELET - Abnormal; Notable for the following components:   RBC 3.73 (*)    Hemoglobin 11.4 (*)    HCT 33.6 (*)    All other components within normal limits  D-DIMER, QUANTITATIVE - Abnormal; Notable for the following components:   D-Dimer, Quant 0.60 (*)    All other components within normal limits  RESP PANEL BY RT-PCR (FLU A&B, COVID) ARPGX2  BRAIN NATRIURETIC PEPTIDE  TROPONIN I (HIGH SENSITIVITY)    EKG EKG Interpretation  Date/Time:  Tuesday January 22 2021 11:26:47 EDT Ventricular Rate:  73 PR Interval:  151 QRS Duration: 107 QT Interval:  402 QTC Calculation: 443 R Axis:   59 Text Interpretation: Sinus rhythm Anteroseptal infarct, old Borderline T abnormalities, inferior leads No significant change since last tracing Confirmed by 11-30-1976 [phone]) on 01/22/2021 2:56:30 PM  Radiology CT Angio Chest PE W and/or Wo Contrast  Result Date: 01/22/2021 CLINICAL DATA:  Recent diagnosis of pneumonia, continued chest pain and pressure, shortness of breath EXAM: CT ANGIOGRAPHY CHEST WITH CONTRAST TECHNIQUE: Multidetector CT imaging of the chest was  performed using the standard protocol during bolus administration of intravenous contrast. Multiplanar CT image reconstructions and MIPs were obtained to evaluate the  vascular anatomy. CONTRAST:  OMNIPAQUE IOHEXOL 350 MG/ML SOLN COMPARISON:  None. FINDINGS: Cardiovascular: Satisfactory opacification of the pulmonary arteries to the segmental level. No evidence of pulmonary embolism. Normal heart size. No pericardial effusion. Mediastinum/Nodes: Numerous enlarged pretracheal and AP window lymph nodes, largest AP window nodes measuring up to 2.8 x 1.9 cm (series 4, image 37). Thyroid gland, trachea, and esophagus demonstrate no significant findings. Lungs/Pleura: There is a masslike consolidation of the paramedian left upper lobe with subtle internal cavitation, measuring approximately 7.7 x 4.6 cm (series 6, image 64). Background of very fine centrilobular nodularity, most concentrated in the lung apices. Trace left pleural effusion and associated atelectasis or consolidation. Upper Abdomen: No acute abnormality. Musculoskeletal: No chest wall abnormality. No acute or significant osseous findings. Review of the MIP images confirms the above findings. IMPRESSION: 1. Negative examination for pulmonary embolism. 2. There is a masslike consolidation of the paramedian left upper lobe with subtle internal cavitation, measuring approximately 7.7 x 4.6 cm. 3. Numerous enlarged pretracheal and AP window lymph nodes, largest AP window nodes measuring up to 2.8 x 1.9 cm. 4. Although possibly reflecting lobar pneumonia and associated reactive adenopathy, this appearance is substantially concerning for primary lung malignancy. At minimum, recommend follow-up CT in 4-6 weeks to assess for resolution if presentation is clinically infectious. 5. Background of very fine centrilobular nodularity, most concentrated in the lung apices, most commonly seen in smoking-related respiratory bronchiolitis. 6. Trace left pleural effusion and associated atelectasis or consolidation. Electronically Signed   By: Jearld Lesch M.D.   On: 01/22/2021 14:26   DG Chest Port 1 View  Result Date:  01/22/2021 CLINICAL DATA:  Cough EXAM: PORTABLE CHEST 1 VIEW COMPARISON:  01/11/2019 FINDINGS: There is a persistent left upper lobe opacity. Additional persistent patchy density at the left lung base. No pleural effusion. Stable cardiomediastinal contours. IMPRESSION: Persistent left upper lobe and lung base opacities. Continued follow-up is recommended. Electronically Signed   By: Guadlupe Spanish M.D.   On: 01/22/2021 12:02    Procedures Procedures   Medications Ordered in ED Medications  ceFEPIme (MAXIPIME) 2 g in sodium chloride 0.9 % 100 mL IVPB (has no administration in time range)  vancomycin (VANCOREADY) IVPB 2000 mg/400 mL (has no administration in time range)  morphine 4 MG/ML injection 4 mg (4 mg Intravenous Given 01/22/21 1228)  ondansetron (ZOFRAN) injection 4 mg (4 mg Intravenous Given 01/22/21 1227)  iohexol (OMNIPAQUE) 350 MG/ML injection 100 mL (100 mLs Intravenous Contrast Given 01/22/21 1336)    ED Course  I have reviewed the triage vital signs and the nursing notes.  Pertinent labs & imaging results that were available during my care of the patient were reviewed by me and considered in my medical decision making (see chart for details).    MDM Rules/Calculators/A&P                           Pt's CXR shows persistent pna.  I was concerned about pe vs lung cancer, so I ordered a CT.  CT shows a likely lung cancer with some pneumonia.  Pt's O2 sats are good with out movement, but they decrease with sleeping and with moving.  O2 sat 89% on RA when I went in the room.  Pt placed on 2L oxygen via [place].  As pt just  finished doxy, I gave him IV vanc and maxipime for his pna.  Pt d/w Dr. Sherryll Burger (triad) for admission.  Final Clinical Impression(s) / ED Diagnoses Final diagnoses:  Postobstructive pneumonia  Cavitating mass in left upper lung lobe  Hypoxia  Tobacco abuse    Rx / DC Orders ED Discharge Orders     None        Jacalyn Lefevre, MD 01/22/21 1504

## 2021-01-22 NOTE — H&P (Signed)
History and Physical    [name] KZL:[id number] DOB: Apr 04, 1961 DOA: 01/22/2021  PCP: [facility] Ort [facility]   Patient coming from: Home  Chief Complaint: Chest pain  HPI: [name] is a 60 y.o. male with medical history significant for COPD, GERD, anxiety disorder, bipolar disorder, chronic pain, and prior PE on Xarelto who presented to the ED with complaints of chest pain that has been ongoing for the last 1 week.  He states that this is mostly left-sided, but with no radiation.  His pain seems to be more aggravated with movement as well as coughing and deep breathing.  He continues to have ongoing coughing and denies any fevers or chills.  He denies any hemoptysis.  He was seen in the ED 9/29 for community-acquired pneumonia and was given Rocephin and Zithromax in the ED and then was discharged with doxycycline which he has completed.  He continues to smoke and states that he is feeling worse.   ED Course: Vital signs stable and patient is afebrile.  CT of the chest is negative for PE, he does have left upper lobe cavitation concerning for primary lung malignancy.  Troponin is negative and EKG with sinus rhythm with no acute findings noted.  He has been started on cefepime as well as vancomycin.  He has been started on 2 L nasal cannula oxygen given hypoxemia.  Review of Systems: Reviewed as noted above, otherwise negative.  Past Medical History:  Diagnosis Date   Anxiety    Arthritis    Asthma    Bipolar disorder (HCC)    Chronic pain    COPD (chronic obstructive pulmonary disease) (HCC)    Degenerative disk disease    GERD (gastroesophageal reflux disease)    Hiatal hernia    History of pneumonia    PE (pulmonary embolism) 2012   Protein S deficiency [facility])     Past Surgical History:  Procedure Laterality Date   CHOLECYSTECTOMY     CLOSED REDUCTION MANDIBLE WITH MANDIBULOMA  10/21/2011   Procedure: CLOSED REDUCTION MANDIBLE WITH MANDIBULOMAXILLARY FUSION;  Surgeon:  Darletta Moll, MD;  Location: [facility] OR;  Service: ENT;  Laterality: N/A;  MMF screws   HAND SURGERY Right 20 years ago   INCISIONAL HERNIA REPAIR N/A 10/27/2018   Procedure: HERNIA REPAIR INCISIONAL WITH MESH;  Surgeon: Franky Macho, MD;  Location: [facility] ORS;  Service: General;  Laterality: N/A;   INCISIONAL HERNIA REPAIR N/A 02/23/2019   Procedure: LAPAROSCOPIC INCISIONAL HERNIA REPAIR WITH MESH;  Surgeon: Franky Macho, MD;  Location: [facility] OR[facility];  Service: General;  Laterality: N/A;   LEG SURGERY     MANDIBULAR HARDWARE REMOVAL  11/11/2011   Procedure: MANDIBULAR HARDWARE REMOVAL;  Surgeon: Darletta Moll, MD;  Location: [facility];  Service: ENT;  Laterality: N/A;   ORIF MANDIBULAR FRACTURE  10/21/2011   Procedure: OPEN REDUCTION INTERNAL FIXATION (ORIF) MANDIBULAR FRACTURE;  Surgeon: Darletta Moll, MD;  Location: [facility] OR;  Service: ENT;  Laterality: N/A;   UMBILICAL HERNIA REPAIR       reports that he has quit smoking. He has a 8.75 pack-year smoking history. He has never used smokeless tobacco. He reports current drug use. He reports that he does not drink alcohol.  No Known Allergies  Family History  Problem Relation Age of Onset   Coronary artery disease Mother    Coronary artery disease Father    Diabetes Other     Prior to Admission medications   Medication  Sig Start Date End Date Taking? Authorizing Provider  albuterol (PROVENTIL HFA;VENTOLIN HFA) 108 (90 BASE) MCG/ACT inhaler Inhale 2 puffs into the lungs every 4 (four) hours as needed for wheezing or shortness of breath.    Yes [provider]  amphetamine-dextroamphetamine (ADDERALL) 20 MG tablet Take 20 mg by mouth 3 (three) times daily. 01/17/19  Yes [provider]  gabapentin (NEURONTIN) 300 MG capsule Take 1 capsule (300 mg total) by mouth 3 (three) times daily. 10/27/18 01/22/21 Yes Franky Macho, MD  meclizine (ANTIVERT) 12.5 MG tablet Take 12.5 mg by mouth 3 (three) times daily as needed for dizziness.   Yes  [provider]  Milk Thistle 1000 MG CAPS Take 1,000 mg by mouth daily.    Yes [provider]  omeprazole (PRILOSEC) 40 MG capsule Take 40 mg by mouth daily.   Yes [provider]  rivaroxaban (XARELTO) 20 MG TABS tablet Take 20 mg by mouth daily.   Yes [provider]  SUBOXONE 8-2 MG FILM Take 1 Film by mouth in the morning, at noon, and at bedtime.  02/14/19  Yes [provider]  triamcinolone cream (KENALOG) 0.5 % Apply 1 application topically 2 (two) times daily as needed.  10/11/19  Yes [provider]  ALPRAZolam Prudy Feeler) 0.5 MG tablet Take 0.5 mg by mouth 2 (two) times daily. Patient not taking: No sig reported 10/10/19   [provider]  atorvastatin (LIPITOR) 10 MG tablet Take 10 mg by mouth daily.  Patient not taking: No sig reported 09/21/19   [provider]  budesonide-formoterol (SYMBICORT) 80-4.5 MCG/ACT inhaler Inhale 2 puffs into the lungs 2 (two) times daily. Patient not taking: No sig reported 06/23/19   [provider]  busPIRone (BUSPAR) 5 MG tablet Take 5 mg by mouth 2 (two) times daily.  Patient not taking: No sig reported 09/21/19   [provider]  doxycycline (VIBRAMYCIN) 100 MG capsule Take 1 capsule (100 mg total) by mouth 2 (two) times daily. Patient not taking: No sig reported 01/10/21   Jacalyn Lefevre, MD  guaiFENesin-dextromethorphan [facility] DM) 100-10 MG/5ML syrup Take 5 mLs by mouth every 4 (four) hours as needed for cough. Patient not taking: No sig reported 10/19/19   Cleora Fleet, MD  hydrOXYzine (ATARAX/VISTARIL) 25 MG tablet Take 1 tablet (25 mg total) by mouth every 6 (six) hours. May cause drowsiness Patient not taking: No sig reported 11/21/20   Triplett, Tammy, PA-C  methylPREDNISolone (MEDROL DOSEPAK) 4 MG TBPK tablet Take by mouth daily. Day 1:  2 pills at breakfast, 1 pill at lunch, 1 pill after supper, 2 pills at bedtime;Day 2:  1 pill at breakfast, 1 pill at  lunch, 1 pill after supper, 2 pills at bedtime;Day 3:  1 pill at breakfast, 1 pill at lunch, 1 pill after supper, 1 pill at bedtime;Day 4:  1 pill at breakfast, 1 pill at lunch, 1 pill at bedtime;Day 5:  1 pill at breakfast, 1 pill at bedtime;Day 6:  1 pill at breakfast Patient not taking: No sig reported 01/10/21   Jacalyn Lefevre, MD  predniSONE (DELTASONE) 10 MG tablet Take 6 tablets day one, 5 tablets day two, 4 tablets day three, 3 tablets day four, 2 tablets day five, then 1 tablet day six Patient not taking: No sig reported 11/21/20   Triplett, Tammy, PA-C  promethazine (PHENERGAN) 25 MG tablet Take 25 mg by mouth every 6 (six) hours as needed for nausea or vomiting. Patient not taking:  No sig reported    [provider]    Physical Exam: Vitals:   01/22/21 1111 01/22/21 1116 01/22/21 1129 01/22/21 1451  BP: 115/67  109/65 108/65  Pulse: 71  73 66  Resp: 20  (!) 24 (!) 23  Temp: 97.9 F (36.6 C)  98.1 F (36.7 C)   TempSrc: Oral  Oral Oral  SpO2: 98%  95% 97%  Weight:  92.5 kg    Height:  6\' 2"  (1.88 m)      Constitutional: NAD, calm, comfortable Vitals:   01/22/21 1111 01/22/21 1116 01/22/21 1129 01/22/21 1451  BP: 115/67  109/65 108/65  Pulse: 71  73 66  Resp: 20  (!) 24 (!) 23  Temp: 97.9 F (36.6 C)  98.1 F (36.7 C)   TempSrc: Oral  Oral Oral  SpO2: 98%  95% 97%  Weight:  92.5 kg    Height:  6\' 2"  (1.88 m)     Eyes: lids and conjunctivae normal Neck: normal, supple Respiratory: clear to auscultation bilaterally. Normal respiratory effort. No accessory muscle use.  Currently on 2 L nasal cannula oxygen. Cardiovascular: Regular rate and rhythm, no murmurs. Abdomen: no tenderness, no distention. Bowel sounds positive.  Musculoskeletal:  No edema. Skin: no rashes, lesions, ulcers.  Psychiatric: Flat affect  Labs on Admission: I have personally reviewed following labs and imaging studies  CBC: Recent Labs  Lab 01/22/21 1220  WBC 7.7  NEUTROABS 5.7   HGB 11.4*  HCT 33.6*  MCV 90.1  PLT 220   Basic Metabolic Panel: Recent Labs  Lab 01/22/21 1220  NA 135  K 3.6  CL 100  CO2 29  GLUCOSE 141*  BUN 12  CREATININE 0.72  CALCIUM 8.6*   GFR: Estimated Creatinine Clearance: 117 mL/min (by C-G formula based on SCr of 0.72 mg/dL). Liver Function Tests: No results for input(s): AST, ALT, ALKPHOS, BILITOT, PROT, ALBUMIN in the last 168 hours. No results for input(s): LIPASE, AMYLASE in the last 168 hours. No results for input(s): AMMONIA in the last 168 hours. Coagulation Profile: No results for input(s): INR, PROTIME in the last 168 hours. Cardiac Enzymes: No results for input(s): CKTOTAL, CKMB, CKMBINDEX, TROPONINI in the last 168 hours. BNP (last 3 results) No results for input(s): PROBNP in the last 8760 hours. HbA1C: No results for input(s): HGBA1C in the last 72 hours. CBG: No results for input(s): GLUCAP in the last 168 hours. Lipid Profile: No results for input(s): CHOL, HDL, LDLCALC, TRIG, CHOLHDL, LDLDIRECT in the last 72 hours. Thyroid Function Tests: No results for input(s): TSH, T4TOTAL, FREET4, T3FREE, THYROIDAB in the last 72 hours. Anemia Panel: No results for input(s): VITAMINB12, FOLATE, FERRITIN, TIBC, IRON, RETICCTPCT in the last 72 hours. Urine analysis:    Component Value Date/Time   COLORURINE YELLOW 01/10/2021 0900   APPEARANCEUR CLEAR 01/10/2021 0900   LABSPEC 1.019 01/10/2021 0900   PHURINE 6.0 01/10/2021 0900   GLUCOSEU NEGATIVE 01/10/2021 0900   HGBUR MODERATE (A) 01/10/2021 0900   BILIRUBINUR NEGATIVE 01/10/2021 0900   KETONESUR NEGATIVE 01/10/2021 0900   PROTEINUR NEGATIVE 01/10/2021 0900   UROBILINOGEN 1.0 02/05/2015 1915   NITRITE NEGATIVE 01/10/2021 0900   LEUKOCYTESUR NEGATIVE 01/10/2021 0900    Radiological Exams on Admission: CT Angio Chest PE W and/or Wo Contrast  Result Date: 01/22/2021 CLINICAL DATA:  Recent diagnosis of pneumonia, continued chest pain and pressure,  shortness of breath EXAM: CT ANGIOGRAPHY CHEST WITH CONTRAST TECHNIQUE: Multidetector CT imaging of the chest was performed using  the standard protocol during bolus administration of intravenous contrast. Multiplanar CT image reconstructions and MIPs were obtained to evaluate the vascular anatomy. CONTRAST:  OMNIPAQUE IOHEXOL 350 MG/ML SOLN COMPARISON:  None. FINDINGS: Cardiovascular: Satisfactory opacification of the pulmonary arteries to the segmental level. No evidence of pulmonary embolism. Normal heart size. No pericardial effusion. Mediastinum/Nodes: Numerous enlarged pretracheal and AP window lymph nodes, largest AP window nodes measuring up to 2.8 x 1.9 cm (series 4, image 37). Thyroid gland, trachea, and esophagus demonstrate no significant findings. Lungs/Pleura: There is a masslike consolidation of the paramedian left upper lobe with subtle internal cavitation, measuring approximately 7.7 x 4.6 cm (series 6, image 64). Background of very fine centrilobular nodularity, most concentrated in the lung apices. Trace left pleural effusion and associated atelectasis or consolidation. Upper Abdomen: No acute abnormality. Musculoskeletal: No chest wall abnormality. No acute or significant osseous findings. Review of the MIP images confirms the above findings. IMPRESSION: 1. Negative examination for pulmonary embolism. 2. There is a masslike consolidation of the paramedian left upper lobe with subtle internal cavitation, measuring approximately 7.7 x 4.6 cm. 3. Numerous enlarged pretracheal and AP window lymph nodes, largest AP window nodes measuring up to 2.8 x 1.9 cm. 4. Although possibly reflecting lobar pneumonia and associated reactive adenopathy, this appearance is substantially concerning for primary lung malignancy. At minimum, recommend follow-up CT in 4-6 weeks to assess for resolution if presentation is clinically infectious. 5. Background of very fine centrilobular nodularity, most concentrated in  the lung apices, most commonly seen in smoking-related respiratory bronchiolitis. 6. Trace left pleural effusion and associated atelectasis or consolidation. Electronically Signed   By: Jearld Lesch M.D.   On: 01/22/2021 14:26   DG Chest Port 1 View  Result Date: 01/22/2021 CLINICAL DATA:  Cough EXAM: PORTABLE CHEST 1 VIEW COMPARISON:  01/11/2019 FINDINGS: There is a persistent left upper lobe opacity. Additional persistent patchy density at the left lung base. No pleural effusion. Stable cardiomediastinal contours. IMPRESSION: Persistent left upper lobe and lung base opacities. Continued follow-up is recommended. Electronically Signed   By: Guadlupe Spanish M.D.   On: 01/22/2021 12:02    EKG: Independently reviewed. SR 73bpm.  Assessment/Plan Active Problems:   Acute hypoxemic respiratory failure (HCC)    Acute hypoxemic respiratory failure-secondary to pneumonia -Continue on vancomycin and cefepime as ordered as he may have failed outpatient treatment recently with doxycycline -Check urine strep pneumonia and Legionella -Sputum cultures -Breathing treatments as needed -Antitussives/mucolytic's -Wean oxygen as tolerated  Cavitary lesion to left upper lobe -Concerning for primary lung malignancy -Appreciate PCCM evaluation in a.m.  Chest pain-pleuritic likely secondary to above -EKG with no concerning findings -Initial troponin negative  COPD with ongoing tobacco abuse -Without acute exacerbation currently -Breathing treatments as needed -Counseled on cessation  Anemia -Check anemia panel -Monitor CBC  History of bipolar disorder/anxiety -Continue home medications  GERD -PPI  Chronic pain -Continue home Suboxone  History of prior PE -Continue Xarelto   DVT prophylaxis: Xarelto Code Status: Full Family Communication: None at bedside Disposition Plan:Admit for pneumonia treatment, Pulm evaluation Consults called:PCCM Admission status: Obs, Tele   Aljean Horiuchi D Yaqueline Gutter  DO [facility]  If 7PM-7AM, please contact night-coverage [email or web address]  01/22/2021, 3:56 PM

## 2021-01-23 DIAGNOSIS — Z7901 Long term (current) use of anticoagulants: Secondary | ICD-10-CM | POA: Diagnosis not present

## 2021-01-23 DIAGNOSIS — D649 Anemia, unspecified: Secondary | ICD-10-CM | POA: Diagnosis present

## 2021-01-23 DIAGNOSIS — J850 Gangrene and necrosis of lung: Secondary | ICD-10-CM

## 2021-01-23 DIAGNOSIS — Z8249 Family history of ischemic heart disease and other diseases of the circulatory system: Secondary | ICD-10-CM | POA: Diagnosis not present

## 2021-01-23 DIAGNOSIS — G8929 Other chronic pain: Secondary | ICD-10-CM | POA: Diagnosis present

## 2021-01-23 DIAGNOSIS — Z7951 Long term (current) use of inhaled steroids: Secondary | ICD-10-CM | POA: Diagnosis not present

## 2021-01-23 DIAGNOSIS — J440 Chronic obstructive pulmonary disease with acute lower respiratory infection: Secondary | ICD-10-CM | POA: Diagnosis present

## 2021-01-23 DIAGNOSIS — Z86711 Personal history of pulmonary embolism: Secondary | ICD-10-CM | POA: Diagnosis not present

## 2021-01-23 DIAGNOSIS — J9601 Acute respiratory failure with hypoxia: Secondary | ICD-10-CM | POA: Diagnosis present

## 2021-01-23 DIAGNOSIS — K219 Gastro-esophageal reflux disease without esophagitis: Secondary | ICD-10-CM | POA: Diagnosis present

## 2021-01-23 DIAGNOSIS — M1990 Unspecified osteoarthritis, unspecified site: Secondary | ICD-10-CM | POA: Diagnosis present

## 2021-01-23 DIAGNOSIS — F17210 Nicotine dependence, cigarettes, uncomplicated: Secondary | ICD-10-CM | POA: Diagnosis present

## 2021-01-23 DIAGNOSIS — J189 Pneumonia, unspecified organism: Principal | ICD-10-CM

## 2021-01-23 DIAGNOSIS — F419 Anxiety disorder, unspecified: Secondary | ICD-10-CM | POA: Diagnosis present

## 2021-01-23 DIAGNOSIS — Z79899 Other long term (current) drug therapy: Secondary | ICD-10-CM | POA: Diagnosis not present

## 2021-01-23 DIAGNOSIS — R918 Other nonspecific abnormal finding of lung field: Secondary | ICD-10-CM | POA: Diagnosis present

## 2021-01-23 DIAGNOSIS — Z20822 Contact with and (suspected) exposure to covid-19: Secondary | ICD-10-CM | POA: Diagnosis present

## 2021-01-23 DIAGNOSIS — F319 Bipolar disorder, unspecified: Secondary | ICD-10-CM | POA: Diagnosis present

## 2021-01-23 DIAGNOSIS — Z833 Family history of diabetes mellitus: Secondary | ICD-10-CM | POA: Diagnosis not present

## 2021-01-23 LAB — BASIC METABOLIC PANEL
Anion gap: 6 (ref 5–15)
BUN: 9 mg/dL (ref 6–20)
CO2: 29 mmol/L (ref 22–32)
Calcium: 8.7 mg/dL — ABNORMAL LOW (ref 8.9–10.3)
Chloride: 101 mmol/L (ref 98–111)
Creatinine, Ser: 0.57 mg/dL — ABNORMAL LOW (ref 0.61–1.24)
GFR, Estimated: >60 mL/min (ref >60)
Glucose, Bld: 105 mg/dL — ABNORMAL HIGH (ref 70–99)
Potassium: 4.2 mmol/L (ref 3.5–5.1)
Sodium: 136 mmol/L (ref 135–145)

## 2021-01-23 LAB — CBC
HCT: 36 % — ABNORMAL LOW (ref 39.0–52.0)
Hemoglobin: 11.4 g/dL — ABNORMAL LOW (ref 13.0–17.0)
MCH: 29.2 pg (ref 26.0–34.0)
MCHC: 31.7 g/dL (ref 30.0–36.0)
MCV: 92.1 fL (ref 80.0–100.0)
Platelets: 203 10*3/uL (ref 150–400)
RBC: 3.91 MIL/uL — ABNORMAL LOW (ref 4.22–5.81)
RDW: 13.1 % (ref 11.5–15.5)
WBC: 7.1 10*3/uL (ref 4.0–10.5)
nRBC: 0 % (ref 0.0–0.2)

## 2021-01-23 LAB — MAGNESIUM: Magnesium: 1.8 mg/dL (ref 1.7–2.4)

## 2021-01-23 LAB — PROCALCITONIN: Procalcitonin: <0.1 ng/mL

## 2021-01-23 LAB — MRSA NEXT GEN BY PCR, NASAL: MRSA by PCR Next Gen: NOT DETECTED

## 2021-01-23 MED ORDER — AMOXICILLIN-POT CLAVULANATE 875-125 MG PO TABS
1.0000 | ORAL_TABLET | Freq: Two times a day (BID) | ORAL | Status: DC
  Administered 2021-01-23 – 2021-01-24 (×3): 1 via ORAL
  Filled 2021-01-23 (×3): qty 1

## 2021-01-23 MED ORDER — NICOTINE 7 MG/24HR TD PT24
7.0000 mg | MEDICATED_PATCH | Freq: Every day | TRANSDERMAL | Status: DC
  Administered 2021-01-23: 7 mg via TRANSDERMAL
  Filled 2021-01-23: qty 1

## 2021-01-23 MED ORDER — LEVOFLOXACIN 500 MG PO TABS: 750.0000 mg | ORAL_TABLET | Freq: Every day | ORAL | Status: DC

## 2021-01-23 MED ORDER — SENNA 8.6 MG PO TABS
1.0000 | ORAL_TABLET | Freq: Every day | ORAL | Status: DC
  Administered 2021-01-23: 8.6 mg via ORAL

## 2021-01-23 NOTE — Progress Notes (Signed)
Initial Nutrition Assessment  DOCUMENTATION CODES:      INTERVENTION:  Ensure Enlive po BID, each supplement provides 350 kcal and 20 grams of protein   Heart Healthy diet    NUTRITION DIAGNOSIS:   Increased nutrient needs related to catabolic illness (necrotizing pneumonia) as evidenced by estimated needs.   GOAL:  Patient will meet greater than or equal to 90% of their needs   MONITOR:  PO intake, Supplement acceptance, Labs, Weight trends  REASON FOR ASSESSMENT:   Malnutrition Screening Tool    ASSESSMENT: RD working remotely.  Patient is a 60 yo male with hx of COPD,GERD, chronic pain who presents with acute hypoxemic respiratory failure, necrotizing pneumonia LUL. Cavitary lesion LUL concerning for lung malignancy per MD. Work up in progress.   Patient with poor appetite prior to admission but intake is very good currently. Talked with nursing who reports "patient is eating everything" he is also eating outside foods. Poor dentition noted per chart.   Acute weight loss noted 4% x 12 days. Current 92.5 kg and 96.2 kg 9/29. On 11/21/20 pt wt 99.8 kg. Overall loss of 7.3 kg (7%) 6 weeks which is significant. Patient at risk for malnutrition. Unable to clearly define at this time.   Medications reviewed.    Labs: BMP Latest Ref Rng & Units 01/23/2021 01/22/2021 01/10/2021  Glucose 70 - 99 mg/dL 270(B) 867(J) 449(E)  BUN 6 - 20 mg/dL 9 12 14   Creatinine 0.61 - 1.24 mg/dL ) 0.10(O 7.12  Sodium 135 - 145 mmol/L 136 135 131(L)  Potassium 3.5 - 5.1 mmol/L 4.2 3.6 3.8  Chloride 98 - 111 mmol/L 101 100 97(L)  CO2 22 - 32 mmol/L 29 29 26   Calcium 8.9 - 10.3 mg/dL 1.97) ) 5.8(I)     NUTRITION - FOCUSED PHYSICAL EXAM: Unable to complete Nutrition-Focused physical exam at this time.  RD working remotely.    Diet Order:   Diet Order             Diet Heart Room service appropriate? Yes; Fluid consistency: Thin  Diet effective now                    EDUCATION NEEDS:  No education needs have been identified at this time  Skin:  Skin Assessment: Reviewed RN Assessment  Last BM:  10/10  Height:   Ht Readings from Last 1 Encounters:  01/22/21 6\' 2"  (1.88 m)    Weight:   Wt Readings from Last 1 Encounters:  01/22/21 92.5 kg    Ideal Body Weight:   86 kg  BMI:  Body mass index is 26.18 kg/m.  Estimated Nutritional Needs:   Kcal:  2300-2500  Protein:  115-125 gr  Fluid:  >2 liters daily  03/24/21 MS,RD,CSG,LDN Contact: 

## 2021-01-23 NOTE — Progress Notes (Signed)
History and Physical    [name] KYH:[id number] DOB: 1961-02-20 DOA: 01/22/2021  PCP: [facility]   Patient coming from: Home Chief Complaint: Chest pain   Subjective:  Patient was seen and examined this morning, stable no acute distress, afebrile normotensive   Hospital course [name] is a 60 y.o. male with medical history significant for COPD, GERD, anxiety disorder, bipolar disorder, chronic pain, and prior PE on Xarelto who presented to the ED with complaints of chest pain that has been ongoing for the last 1 week.  He states that this is mostly left-sided, but with no radiation.  His pain seems to be more aggravated with movement as well as coughing and deep breathing.  He continues to have ongoing coughing and denies any fevers or chills.  He denies any hemoptysis.  He was seen in the ED 9/29 for community-acquired pneumonia and was given Rocephin and Zithromax in the ED and then was discharged with doxycycline which he has completed.  He continues to smoke and states that he is feeling worse.   ED Course: Vital signs stable and patient is afebrile.  CT of the chest is negative for PE, he does have left upper lobe cavitation concerning for primary lung malignancy.  Troponin is negative and EKG with sinus rhythm with no acute findings noted.  He has been started on cefepime as well as vancomycin.  He has been started on 2 L nasal cannula oxygen given hypoxemia.   Assessment/Plan Acute hypoxemic respiratory failure (HCC) Necrotizing pneumonia  LUL anteromedially     Acute hypoxemic respiratory failure - secondary to pneumonia -S/p treatment with Vancomycin and Cefepime >>> switch to p.o. Augmentin twice daily for 2 weeks - treatment recently with doxycycline -Patient is afebrile, normotensive -Check urine strep pneumonia and Legionella -Sputum cultures -Breathing treatments as needed -Antitussives/mucolytic's -Wean oxygen as tolerated  Cavitary lesion  to left upper lobe -Concerning for primary lung malignancy -Status post evaluation by pulmonary Dr. Sherene Sires who recommended 2 weeks of oral antibiotics, and follow-up in office, plan to repeat imaging in 3-6 months then may follow-up with PET scan or FOB   Chest pain-pleuritic likely secondary to above -EKG with no concerning findings -Initial troponin negative  COPD with ongoing tobacco abuse -Without acute exacerbation currently -Breathing treatments as needed -Counseled on cessation  Anemia -Check anemia panel -Monitor CBC  History of bipolar disorder/anxiety -Continue home medications  GERD -PPI   History of prior PE -Continue Xarelto   DVT prophylaxis: Xarelto Code Status: Full Family Communication: None at bedside Disposition Plan:Admit for pneumonia treatment, Pulm evaluation Consults called:PCCM Admission status: Obs, Tele    Review of Systems: Reviewed as noted above, otherwise negative.  Past Medical History:  Diagnosis Date   Anxiety    Arthritis    Asthma    Bipolar disorder (HCC)    Chronic pain    COPD (chronic obstructive pulmonary disease) (HCC)    Degenerative disk disease    GERD (gastroesophageal reflux disease)    Hiatal hernia    History of pneumonia    PE (pulmonary embolism) 2012   Protein S deficiency [facility])     Past Surgical History:  Procedure Laterality Date   CHOLECYSTECTOMY     CLOSED REDUCTION MANDIBLE WITH MANDIBULOMA  10/21/2011   Procedure: CLOSED REDUCTION MANDIBLE WITH MANDIBULOMAXILLARY FUSION;  Surgeon: Darletta Moll, MD;  Location: [facility];  Service: ENT;  Laterality: N/A;  MMF screws   HAND SURGERY Right 20 years  ago   INCISIONAL HERNIA REPAIR N/A 10/27/2018   Procedure: HERNIA REPAIR INCISIONAL WITH MESH;  Surgeon: Franky Macho, MD;  Location: [facility];  Service: General;  Laterality: N/A;   INCISIONAL HERNIA REPAIR N/A 02/23/2019   Procedure: LAPAROSCOPIC INCISIONAL HERNIA REPAIR WITH MESH;  Surgeon: Franky Macho, MD;   Location: [facility];  Service: General;  Laterality: N/A;   LEG SURGERY     MANDIBULAR HARDWARE REMOVAL  11/11/2011   Procedure: MANDIBULAR HARDWARE REMOVAL;  Surgeon: Darletta Moll, MD;  Location:  SURGERY CENTER;  Service: ENT;  Laterality: N/A;   ORIF MANDIBULAR FRACTURE  10/21/2011   Procedure: OPEN REDUCTION INTERNAL FIXATION (ORIF) MANDIBULAR FRACTURE;  Surgeon: Darletta Moll, MD;  Location: [facility] OR;  Service: ENT;  Laterality: N/A;   UMBILICAL HERNIA REPAIR       reports that he has quit smoking. He has a 8.75 pack-year smoking history. He has never used smokeless tobacco. He reports current drug use. He reports that he does not drink alcohol.  No Known Allergies  Family History  Problem Relation Age of Onset   Coronary artery disease Mother    Coronary artery disease Father    Diabetes Other     Prior to Admission medications   Medication Sig Start Date End Date Taking? Authorizing Provider  albuterol (PROVENTIL HFA;VENTOLIN HFA) 108 (90 BASE) MCG/ACT inhaler Inhale 2 puffs into the lungs every 4 (four) hours as needed for wheezing or shortness of breath.    Yes [provider]  amphetamine-dextroamphetamine (ADDERALL) 20 MG tablet Take 20 mg by mouth 3 (three) times daily. 01/17/19  Yes [provider]  gabapentin (NEURONTIN) 300 MG capsule Take 1 capsule (300 mg total) by mouth 3 (three) times daily. 10/27/18 01/22/21 Yes Franky Macho, MD  meclizine (ANTIVERT) 12.5 MG tablet Take 12.5 mg by mouth 3 (three) times daily as needed for dizziness.   Yes [provider]  Milk Thistle 1000 MG CAPS Take 1,000 mg by mouth daily.    Yes [provider]  omeprazole (PRILOSEC) 40 MG capsule Take 40 mg by mouth daily.   Yes [provider]  rivaroxaban (XARELTO) 20 MG TABS tablet Take 20 mg by mouth daily.   Yes [provider]  SUBOXONE 8-2 MG FILM Take 1 Film by mouth in the morning, at noon, and at bedtime.  02/14/19  Yes [provider]  triamcinolone cream (KENALOG) 0.5 % Apply 1 application topically 2 (two) times daily as needed.  10/11/19  Yes [provider]  ALPRAZolam Prudy Feeler) 0.5 MG tablet Take 0.5 mg by mouth 2 (two) times daily. Patient not taking: No sig reported 10/10/19   [provider]  atorvastatin (LIPITOR) 10 MG tablet Take 10 mg by mouth daily.  Patient not taking: No sig reported 09/21/19   [provider]  budesonide-formoterol (SYMBICORT) 80-4.5 MCG/ACT inhaler Inhale 2 puffs into the lungs 2 (two) times daily. Patient not taking: No sig reported 06/23/19   [provider]  busPIRone (BUSPAR) 5 MG tablet Take 5 mg by mouth 2 (two) times daily.  Patient not taking: No sig reported 09/21/19   [provider]  doxycycline (VIBRAMYCIN) 100 MG capsule Take 1 capsule (100 mg total) by mouth 2 (two) times daily. Patient not taking: No sig reported 01/10/21   Jacalyn Lefevre, MD  guaiFENesin-dextromethorphan [facility] DM) 100-10 MG/5ML syrup Take 5 mLs by mouth every 4 (four) hours as needed for cough. Patient not taking: No sig reported  10/19/19   Johnson, Clanford L, MD  hydrOXYzine (ATARAX/VISTARIL) 25 MG tablet Take 1 tablet (25 mg total) by mouth every 6 (six) hours. May cause drowsiness Patient not taking: No sig reported 11/21/20   Triplett, Tammy, PA-C  methylPREDNISolone (MEDROL DOSEPAK) 4 MG TBPK tablet Take by mouth daily. Day 1:  2 pills at breakfast, 1 pill at lunch, 1 pill after supper, 2 pills at bedtime;Day 2:  1 pill at breakfast, 1 pill at lunch, 1 pill after supper, 2 pills at bedtime;Day 3:  1 pill at breakfast, 1 pill at lunch, 1 pill after supper, 1 pill at bedtime;Day 4:  1 pill at breakfast, 1 pill at lunch, 1 pill at bedtime;Day 5:  1 pill at breakfast, 1 pill at bedtime;Day 6:  1 pill at breakfast Patient not taking: No sig reported 01/10/21   Jacalyn Lefevre, MD  predniSONE (DELTASONE) 10 MG tablet Take 6 tablets day one, 5 tablets day two, 4  tablets day three, 3 tablets day four, 2 tablets day five, then 1 tablet day six Patient not taking: No sig reported 11/21/20   Triplett, Tammy, PA-C  promethazine (PHENERGAN) 25 MG tablet Take 25 mg by mouth every 6 (six) hours as needed for nausea or vomiting. Patient not taking: No sig reported    [provider]    Physical Exam: Vitals:   01/22/21 2005 01/23/21 0048 01/23/21 0401 01/23/21 0903  BP:  (!) 85/50 93/64   Pulse:  71 70   Resp:  17 17   Temp:  98.2 F (36.8 C) 98.2 F (36.8 C)   TempSrc:  Oral Oral   SpO2: 95% 93% 100% 100%  Weight:      Height:        Constitutional: NAD, calm, comfortable Vitals:   01/22/21 2005 01/23/21 0048 01/23/21 0401 01/23/21 0903  BP:  (!) 85/50 93/64   Pulse:  71 70   Resp:  17 17   Temp:  98.2 F (36.8 C) 98.2 F (36.8 C)   TempSrc:  Oral Oral   SpO2: 95% 93% 100% 100%  Weight:      Height:         Physical Exam:   General:  Alert, oriented, cooperative, no distress;   HEENT:  Normocephalic, PERRL, otherwise with in Normal limits   Neuro:  CNII-XII intact. , normal motor and sensation, reflexes intact   Lungs:   Clear to auscultation BL, Respirations unlabored, no wheezes / crackles  Cardio:    S1/S2, RRR, No murmure, No Rubs or Gallops   Abdomen:   Soft, non-tender, bowel sounds active all four quadrants,  no guarding or peritoneal signs.  Muscular skeletal:  Limited exam - in bed, able to move all 4 extremities, Normal strength,  2+ pulses,  symmetric, No pitting edema  Skin:  Dry, warm to touch, negative for any Rashes,  Wounds: Please see nursing documentation   Labs on Admission: I have personally reviewed following labs and imaging studies  CBC: Recent Labs  Lab 01/22/21 1220 01/23/21 0523  WBC 7.7 7.1  NEUTROABS 5.7  --   HGB 11.4* 11.4*  HCT 33.6* 36.0*  MCV 90.1 92.1  PLT 220 203   Basic Metabolic Panel: Recent Labs  Lab 01/22/21 1220 01/23/21 0657  NA 135 136  K 3.6 4.2  CL 100 101   CO2 29 29  GLUCOSE 141* 105*  BUN 12 9  CREATININE 0.72 0.57*  CALCIUM 8.6* 8.7*  MG  --  1.8    Recent Labs    01/22/21 1122 01/22/21 1649  VITAMINB12  --  1,614*  FOLATE  --  30.6  FERRITIN  --  68  TIBC  --  228*  IRON  --  21*  RETICCTPCT 1.0  --    Urine analysis:    Component Value Date/Time   COLORURINE YELLOW 01/10/2021 0900   APPEARANCEUR CLEAR 01/10/2021 0900   LABSPEC 1.019 01/10/2021 0900   PHURINE 6.0 01/10/2021 0900   GLUCOSEU NEGATIVE 01/10/2021 0900   HGBUR MODERATE (A) 01/10/2021 0900   BILIRUBINUR NEGATIVE 01/10/2021 0900   KETONESUR NEGATIVE 01/10/2021 0900   PROTEINUR NEGATIVE 01/10/2021 0900   UROBILINOGEN 1.0 02/05/2015 1915   NITRITE NEGATIVE 01/10/2021 0900   LEUKOCYTESUR NEGATIVE 01/10/2021 0900    Radiological Exams on Admission: CT Angio Chest PE W and/or Wo Contrast  Result Date: 01/22/2021 CLINICAL DATA:  Recent diagnosis of pneumonia, continued chest pain and pressure, shortness of breath EXAM: CT ANGIOGRAPHY CHEST WITH CONTRAST TECHNIQUE: Multidetector CT imaging of the chest was performed using the standard protocol during bolus administration of intravenous contrast. Multiplanar CT image reconstructions and MIPs were obtained to evaluate the vascular anatomy. CONTRAST:  OMNIPAQUE IOHEXOL 350 MG/ML SOLN COMPARISON:  None. FINDINGS: Cardiovascular: Satisfactory opacification of the pulmonary arteries to the segmental level. No evidence of pulmonary embolism. Normal heart size. No pericardial effusion. Mediastinum/Nodes: Numerous enlarged pretracheal and AP window lymph nodes, largest AP window nodes measuring up to 2.8 x 1.9 cm (series 4, image 37). Thyroid gland, trachea, and esophagus demonstrate no significant findings. Lungs/Pleura: There is a masslike consolidation of the paramedian left upper lobe with subtle internal cavitation, measuring approximately 7.7 x 4.6 cm (series 6, image 64). Background of very fine centrilobular  nodularity, most concentrated in the lung apices. Trace left pleural effusion and associated atelectasis or consolidation. Upper Abdomen: No acute abnormality. Musculoskeletal: No chest wall abnormality. No acute or significant osseous findings. Review of the MIP images confirms the above findings. IMPRESSION: 1. Negative examination for pulmonary embolism. 2. There is a masslike consolidation of the paramedian left upper lobe with subtle internal cavitation, measuring approximately 7.7 x 4.6 cm. 3. Numerous enlarged pretracheal and AP window lymph nodes, largest AP window nodes measuring up to 2.8 x 1.9 cm. 4. Although possibly reflecting lobar pneumonia and associated reactive adenopathy, this appearance is substantially concerning for primary lung malignancy. At minimum, recommend follow-up CT in 4-6 weeks to assess for resolution if presentation is clinically infectious. 5. Background of very fine centrilobular nodularity, most concentrated in the lung apices, most commonly seen in smoking-related respiratory bronchiolitis. 6. Trace left pleural effusion and associated atelectasis or consolidation. Electronically Signed   By: Jearld Lesch M.D.   On: 01/22/2021 14:26   DG Chest Port 1 View  Result Date: 01/22/2021 CLINICAL DATA:  Cough EXAM: PORTABLE CHEST 1 VIEW COMPARISON:  01/11/2019 FINDINGS: There is a persistent left upper lobe opacity. Additional persistent patchy density at the left lung base. No pleural effusion. Stable cardiomediastinal contours. IMPRESSION: Persistent left upper lobe and lung base opacities. Continued follow-up is recommended. Electronically Signed   By: Guadlupe Spanish M.D.   On: 01/22/2021 12:02    EKG: Independently reviewed. SR 73bpm.  . SIGNED: Kendell Bane, MD, FHM. [facility],  Pager (please use [email or web address] to page/text)  Please use Epic Secure Chat for non-urgent communication (7AM-7PM) If 7PM-7AM, please contact night-coverage [email or web address],   01/23/2021, 2:35 PM

## 2021-01-23 NOTE — Consult Note (Addendum)
NAME:  [name], MRN:  [id number], DOB:  1960/06/06, LOS: 0 ADMISSION DATE:  01/22/2021, CONSULTATION DATE:  01/23/21 REFERRING MD:  Triad   CHIEF COMPLAINT:  L cp     History of Present Illness:  60 y.o.male active smoker  with medical history significant for COPD, GERD, anxiety disorder, bipolar disorder, chronic pain, and prior PE on Xarelto who  1st presented to the ED 01/10/21 with complaints of chest pain onset 9/26  with dx  LUL  CAP rx  Rocephin and Zithromax in the ED and then was discharged with doxycycline which he stated he  completed but felt no better with ongoing chills/ fever/ cp     ED Course: Vital signs stable and patient is afebrile.  CT of the chest is negative for PE, he does have left upper lobe cavitation concerning for primary lung malignancy.  Troponin is negative and EKG with sinus rhythm with no acute findings noted.  He was  started on cefepime as well as vancomycin.  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 Also has very poor dentition and plans to have the rest of them removed but no recent dental infections or risk of asp now that he's aware of   Sputum has been yellow, never bloody.  Does e cigs commercially plus still smoking  denies etoh or nonprescrition drug use.   Pertinent  Medical History    Significant Hospital Events: Including procedures, antibiotic start and stop dates in addition to other pertinent events   CTa 01/22/21 1. Negative examination for pulmonary embolism. There is a masslike consolidation of the paramedian left upper lobe with subtle internal cavitation, measuring approximately 7.7 x 4.6 cm. Numerous enlarged pretracheal and AP window lymph nodes, largest AP window nodes measuring up to 2.8 x 1.9 cm. Background of very fine centrilobular nodularity, most concentrated in the lung apices, Trace  left pleural effusion and associated atelectasis or consolidation. -  Urine for legionella 10/12  >>> -  Urine for Strep pneum 10/12 >>>     Scheduled Meds:  amphetamine-dextroamphetamine  20 mg Oral TID with meals   atorvastatin  10 mg Oral Daily   buprenorphine-naloxone  1 tablet Sublingual Daily   feeding supplement  237 mL Oral BID BM   gabapentin  300 mg Oral TID   mometasone-formoterol  2 puff Inhalation BID   pantoprazole  40 mg Oral Daily   rivaroxaban  20 mg Oral q1800   sodium chloride flush  3 mL Intravenous Q12H   Continuous Infusions:  sodium chloride     ceFEPime (MAXIPIME) IV 2 g (01/23/21 0508)   vancomycin 1,750 mg (01/23/21 0608)   PRN Meds:.sodium chloride, acetaminophen **OR** acetaminophen, guaiFENesin-dextromethorphan, ipratropium-albuterol, meclizine, ondansetron **OR** ondansetron (ZOFRAN) IV, sodium chloride flush    Interim History / Subjective:  Feeling better, less cp, no fever and   Objective   Blood pressure 93/64, pulse 70, temperature 98.2 F (36.8 C), temperature source Oral, resp. rate 17, height 6\' 2"  (1.88 m), weight 92.5 kg, SpO2 100 %.        Intake/Output Summary (Last 24 hours) at 01/23/2021 0714 Last data filed at 01/22/2021 1907 Gross per 24 hour  Intake 240 ml  Output --  Net 240 ml   Filed Weights   01/22/21 1116  Weight: 92.5 kg  Examination: Tmax  98.2   02 sats 100%   on  RA  General: thin amb wm slt congested sounding cough  HENT: poor dentition Lungs: insp/exp rhonchi nothing localized, no amphoric changes Cardiovascular: RRR no s 3 or significant murmure Abdomen: soft / benigh Extremities: warm w/o cyanosis clubbing or edema Neuro: intact     Resolved Hospital Problem list     Assessment & Plan:  1)  CAP  LUL anterioromedially in pt w poor dentition/ sleeps on side/ h/o aspiration p cva Rx   Zmax/rocephin x 1 9/29 and rx po doxy > no better - maxepime 10/11 >>> - Vanc 10/11 >>> - abrupt onset of  symptoms 9/26 suggests infectious cause but smoking hx and ct raise concern this could be post obst/ necrotizing process along with additional hx of wt loss onset 2 m PTA with some anorexia as well   Rec Check urine studies as above as legionella can present like this  Change to augmentin bid x 2 weeks then ov with me or Dr Craige Cotta / Vassie Loll for f/u cxr - if cxr normalizes can just do ct in 3-6 m and place him in the low dose CT program, otherwise PET then FOB likely next steps   2) Active smoker >>> strongly advised to quid     Labs   CBC: Recent Labs  Lab 01/22/21 1220 01/23/21 0523  WBC 7.7 7.1  NEUTROABS 5.7  --   HGB 11.4* 11.4*  HCT 33.6* 36.0*  MCV 90.1 92.1  PLT 220 203    Basic Metabolic Panel: Recent Labs  Lab 01/22/21 1220  NA 135  K 3.6  CL 100  CO2 29  GLUCOSE 141*  BUN 12  CREATININE 0.72  CALCIUM 8.6*   GFR: Estimated Creatinine Clearance: 117 mL/min (by C-G formula based on SCr of 0.72 mg/dL). Recent Labs  Lab 01/22/21 1220 01/23/21 0523  WBC 7.7 7.1    Liver Function Tests: No results for input(s): AST, ALT, ALKPHOS, BILITOT, PROT, ALBUMIN in the last 168 hours. No results for input(s): LIPASE, AMYLASE in the last 168 hours. No results for input(s): AMMONIA in the last 168 hours.  ABG    Component Value Date/Time   PHART 7.354 01/31/2008 0510   PCO2ART 34.6 (L) 01/31/2008 0510   PO2ART 62.4 (L) 01/31/2008 0510   HCO3 27.0 (H) 02/05/2015 1855   TCO2 23 12/20/2008 0059   ACIDBASEDEF 5.8 (H) 01/31/2008 0510   O2SAT 99.2 02/05/2015 1855     Coagulation Profile: No results for input(s): INR, PROTIME in the last 168 hours.  Cardiac Enzymes: No results for input(s): CKTOTAL, CKMB, CKMBINDEX, TROPONINI in the last 168 hours.  HbA1C: Hgb A1c MFr Bld  Date/Time Value Ref Range Status  09/05/2010 03:00 AM  <5.7 % Final   5.2 (NOTE)                                                                       According to the ADA Clinical Practice  Recommendations for 2011, when HbA1c is used as a screening test:   >=6.5%   Diagnostic of Diabetes Mellitus           (if abnormal result  is confirmed)  5.7-6.4%  Increased risk of developing Diabetes Mellitus  References:Diagnosis and Classification of Diabetes Mellitus,Diabetes Care,2011,34(Suppl 1):S62-S69 and Standards of Medical Care in         Diabetes - 2011,Diabetes Care,2011,34  (Suppl 1):S11-S61.  01/30/2008 12:46 PM  4.6 - 6.1 % Final   5.3 (NOTE)   The ADA recommends the following therapeutic goal for glycemic   control related to Hgb A1C measurement:   Goal of Therapy:   < 7.0% Hgb A1C   Reference: American Diabetes Association: Clinical Practice   Recommendations 2008, Diabetes Care,  2008, 31:(Suppl 1).    CBG: No results for input(s): GLUCAP in the last 168 hours.     Past Medical History:  He,  has a past medical history of Anxiety, Arthritis, Asthma, Bipolar disorder (HCC), Chronic pain, COPD (chronic obstructive pulmonary disease) (HCC), Degenerative disk disease, GERD (gastroesophageal reflux disease), Hiatal hernia, History of pneumonia, PE (pulmonary embolism) (2012), and Protein S deficiency (HCC).   Surgical History:   Past Surgical History:  Procedure Laterality Date   CHOLECYSTECTOMY     CLOSED REDUCTION MANDIBLE WITH MANDIBULOMA  10/21/2011   Procedure: CLOSED REDUCTION MANDIBLE WITH MANDIBULOMAXILLARY FUSION;  Surgeon: Darletta Moll, MD;  Location: [facility] OR;  Service: ENT;  Laterality: N/A;  MMF screws   HAND SURGERY Right 20 years ago   INCISIONAL HERNIA REPAIR N/A 10/27/2018   Procedure: HERNIA REPAIR INCISIONAL WITH MESH;  Surgeon: Franky Macho, MD;  Location: [facility];  Service: General;  Laterality: N/A;   INCISIONAL HERNIA REPAIR N/A 02/23/2019   Procedure: LAPAROSCOPIC INCISIONAL HERNIA REPAIR WITH MESH;  Surgeon: Franky Macho, MD;  Location: [facility];  Service: General;  Laterality: N/A;   LEG SURGERY     MANDIBULAR HARDWARE REMOVAL  11/11/2011   Procedure:  MANDIBULAR HARDWARE REMOVAL;  Surgeon: Darletta Moll, MD;  Location: [facility];  Service: ENT;  Laterality: N/A;   ORIF MANDIBULAR FRACTURE  10/21/2011   Procedure: OPEN REDUCTION INTERNAL FIXATION (ORIF) MANDIBULAR FRACTURE;  Surgeon: Darletta Moll, MD;  Location: [facility];  Service: ENT;  Laterality: N/A;   UMBILICAL HERNIA REPAIR       Social History:   reports that he has quit smoking. He has a 8.75 pack-year smoking history. He has never used smokeless tobacco. He reports current drug use. He reports that he does not drink alcohol.   Family History:  His family history includes Coronary artery disease in his father and mother; Diabetes in an other family member.   Allergies No Known Allergies   Home Medications  Prior to Admission medications   Medication Sig Start Date End Date Taking? Authorizing Provider  albuterol (PROVENTIL HFA;VENTOLIN HFA) 108 (90 BASE) MCG/ACT inhaler Inhale 2 puffs into the lungs every 4 (four) hours as needed for wheezing or shortness of breath.    Yes [provider]  amphetamine-dextroamphetamine (ADDERALL) 20 MG tablet Take 20 mg by mouth 3 (three) times daily. 01/17/19  Yes [provider]  gabapentin (NEURONTIN) 300 MG capsule Take 1 capsule (300 mg total) by mouth 3 (three) times daily. 10/27/18 01/22/21 Yes Franky Macho, MD  meclizine (ANTIVERT) 12.5 MG tablet Take 12.5 mg by mouth 3 (three) times daily as needed for dizziness.   Yes [provider]  Milk Thistle 1000 MG CAPS Take 1,000 mg by mouth daily.    Yes [provider]  omeprazole (PRILOSEC) 40 MG capsule Take 40 mg by mouth daily.   Yes [provider]  rivaroxaban (XARELTO) 20  MG TABS tablet Take 20 mg by mouth daily.   Yes [provider]  SUBOXONE 8-2 MG FILM Take 1 Film by mouth in the morning, at noon, and at bedtime.  02/14/19  Yes [provider]  triamcinolone cream (KENALOG) 0.5 % Apply 1 application topically 2 (two)  times daily as needed.  10/11/19  Yes [provider]  ALPRAZolam Prudy Feeler) 0.5 MG tablet Take 0.5 mg by mouth 2 (two) times daily. Patient not taking: No sig reported 10/10/19   [provider]  atorvastatin (LIPITOR) 10 MG tablet Take 10 mg by mouth daily.  Patient not taking: No sig reported 09/21/19   [provider]  budesonide-formoterol (SYMBICORT) 80-4.5 MCG/ACT inhaler Inhale 2 puffs into the lungs 2 (two) times daily. Patient not taking: No sig reported 06/23/19   [provider]  busPIRone (BUSPAR) 5 MG tablet Take 5 mg by mouth 2 (two) times daily.  Patient not taking: No sig reported 09/21/19   [provider]  doxycycline (VIBRAMYCIN) 100 MG capsule Take 1 capsule (100 mg total) by mouth 2 (two) times daily. Patient not taking: No sig reported 01/10/21   Jacalyn Lefevre, MD  guaiFENesin-dextromethorphan [facility] DM) 100-10 MG/5ML syrup Take 5 mLs by mouth every 4 (four) hours as needed for cough. Patient not taking: No sig reported 10/19/19   Cleora Fleet, MD  hydrOXYzine (ATARAX/VISTARIL) 25 MG tablet Take 1 tablet (25 mg total) by mouth every 6 (six) hours. May cause drowsiness Patient not taking: No sig reported 11/21/20   Triplett, Tammy, PA-C  methylPREDNISolone (MEDROL DOSEPAK) 4 MG TBPK tablet Take by mouth daily. Day 1:  2 pills at breakfast, 1 pill at lunch, 1 pill after supper, 2 pills at bedtime;Day 2:  1 pill at breakfast, 1 pill at lunch, 1 pill after supper, 2 pills at bedtime;Day 3:  1 pill at breakfast, 1 pill at lunch, 1 pill after supper, 1 pill at bedtime;Day 4:  1 pill at breakfast, 1 pill at lunch, 1 pill at bedtime;Day 5:  1 pill at breakfast, 1 pill at bedtime;Day 6:  1 pill at breakfast Patient not taking: No sig reported 01/10/21   Jacalyn Lefevre, MD  predniSONE (DELTASONE) 10 MG tablet Take 6 tablets day one, 5 tablets day two, 4 tablets day three, 3 tablets day four, 2 tablets day five, then 1 tablet day six Patient  not taking: No sig reported 11/21/20   Triplett, Tammy, PA-C  promethazine (PHENERGAN) 25 MG tablet Take 25 mg by mouth every 6 (six) hours as needed for nausea or vomiting. Patient not taking: No sig reported    [provider]      Sandrea Hughs, MD Pulmonary and Critical Care Medicine [facility] Cell [phone]   After 7:00 pm call Elink  [phone]

## 2021-01-24 DIAGNOSIS — R918 Other nonspecific abnormal finding of lung field: Secondary | ICD-10-CM

## 2021-01-24 DIAGNOSIS — J9601 Acute respiratory failure with hypoxia: Secondary | ICD-10-CM | POA: Diagnosis not present

## 2021-01-24 DIAGNOSIS — J850 Gangrene and necrosis of lung: Secondary | ICD-10-CM | POA: Diagnosis not present

## 2021-01-24 LAB — CBC
HCT: 39.2 % (ref 39.0–52.0)
Hemoglobin: 12.6 g/dL — ABNORMAL LOW (ref 13.0–17.0)
MCH: 29.4 pg (ref 26.0–34.0)
MCHC: 32.1 g/dL (ref 30.0–36.0)
MCV: 91.6 fL (ref 80.0–100.0)
Platelets: 255 10*3/uL (ref 150–400)
RBC: 4.28 MIL/uL (ref 4.22–5.81)
RDW: 13 % (ref 11.5–15.5)
WBC: 4.6 10*3/uL (ref 4.0–10.5)
nRBC: 0 % (ref 0.0–0.2)

## 2021-01-24 LAB — LEGIONELLA PNEUMOPHILA SEROGP 1 UR AG: L. pneumophila Serogp 1 Ur Ag: NEGATIVE

## 2021-01-24 LAB — STREP PNEUMONIAE URINARY ANTIGEN: Strep Pneumo Urinary Antigen: NEGATIVE

## 2021-01-24 LAB — HIV ANTIBODY (ROUTINE TESTING W REFLEX): HIV Screen 4th Generation wRfx: NONREACTIVE

## 2021-01-24 MED ORDER — ADVAIR HFA 45-21 MCG/ACT IN AERO: 2.0000 | INHALATION_SPRAY | Freq: Two times a day (BID) | RESPIRATORY_TRACT | 12 refills | Status: DC

## 2021-01-24 MED ORDER — ENSURE ENLIVE PO LIQD: 237.0000 mL | Freq: Two times a day (BID) | ORAL | 12 refills | Status: AC

## 2021-01-24 MED ORDER — AMOXICILLIN-POT CLAVULANATE 875-125 MG PO TABS: 1.0000 | ORAL_TABLET | Freq: Two times a day (BID) | ORAL | 0 refills | Status: AC

## 2021-01-24 MED ORDER — FERROUS SULFATE 324 MG PO TBEC: 324.0000 mg | DELAYED_RELEASE_TABLET | Freq: Every day | ORAL | 2 refills | Status: DC

## 2021-01-24 MED ORDER — NICOTINE 7 MG/24HR TD PT24: 7.0000 mg | MEDICATED_PATCH | Freq: Every day | TRANSDERMAL | 2 refills | Status: AC

## 2021-01-24 NOTE — Discharge Summary (Signed)
Physician Discharge Summary [facility]    Patient: [name]                   Admit date: 01/22/2021   DOB: November 29, 1960             Discharge date:01/24/2021/10:38 AM HDQ:[id number]                          PCP: Ponciano Ort [facility]  Disposition: HOME  Recommendations for Outpatient Follow-up:   Follow up: With pulmonologist Dr. Sherene Sires in 1 week, for further evaluation recommendation work-up for cavitary Continue current medication including Decadron, oral antibiotics  Discharge Condition: Stable   Code Status:   Code Status: Full Code  Diet recommendation: Cardiac diet   Discharge Diagnoses:    Active Problems:   Acute hypoxemic respiratory failure (HCC)   Necrotizing pneumonia  LUL anteromedially    Lung mass   History of Present Illness/ Hospital Course Charline Bills Summary:   [name] is a 61 y.o. male with medical history significant for COPD, GERD, anxiety disorder, bipolar disorder, chronic pain, and prior PE on Xarelto who presented to the ED with complaints of chest pain that has been ongoing for the last 1 week.  He states that this is mostly left-sided, but with no radiation.  His pain seems to be more aggravated with movement as well as coughing and deep breathing.  He continues to have ongoing coughing and denies any fevers or chills.  He denies any hemoptysis.  He was seen in the ED 9/29 for community-acquired pneumonia and was given Rocephin and Zithromax in the ED and then was discharged with doxycycline which he has completed.  He continues to smoke and states that he is feeling worse.  ED Course: Vital signs stable and patient is afebrile.  CT of the chest is negative for PE, he does have left upper lobe cavitation concerning for primary lung malignancy.  Troponin is negative and EKG with sinus rhythm with no acute findings noted.  He has been started on cefepime as well as vancomycin.  He has been started on 2 L nasal cannula oxygen given  hypoxemia  Acute hypoxemic respiratory failure - secondary to pneumonia -S/p treatment with Vancomycin and Cefepime >>> switch to p.o. Augmentin twice daily for 2 weeks - treatment recently with doxycycline -Patient is afebrile, normotensive -Check urine strep pneumonia and Legionella -Sputum cultures -Breathing treatments as needed -Antitussives/mucolytic's -Stable, has been weaned off oxygen, satting greater 90% on room air,   Cavitary lesion to left upper lobe -Concerning for primary lung malignancy -Status post evaluation by pulmonary Dr. Sherene Sires who recommended 2 weeks of oral antibiotics, and follow-up in office, plan to repeat imaging in 3-6 months then may follow-up with PET scan or FOB  -Improved chest pain   Chest pain-pleuritic likely secondary to above -EKG with no concerning findings -Initial troponin negative -Improved   COPD with ongoing tobacco abuse -Without acute exacerbation currently -Continue inhalers, -Counseled on cessation   Anemia -H&H stable -Total iron 21, TIBC 228, saturation ratio 9   History of bipolar disorder/anxiety -Continue home medications   GERD -PPI     History of prior PE -Continue Xarelto     Code Status: Full  Nutritional status:  Nutrition Problem: Increased nutrient needs Etiology: catabolic illness (necrotizing pneumonia) Signs/Symptoms: estimated needs Interventions: Ensure Enlive (each supplement provides 350kcal and 20 grams of protein)  The patient's BMI is: Body  mass index is 26.18 kg/m. I agree with the assessment and plan as outlined   Discharge Instructions:   Discharge Instructions     Activity as tolerated - No restrictions   Complete by: As directed    Diet - low sodium heart healthy   Complete by: As directed    Discharge instructions   Complete by: As directed    Follow-up with pulmonologist Dr. Sherene Sires regarding your lung lesions, further work-up imaging as an outpatient Abstain from tobacco use,  continue NicoDerm patch, Continue currently prescribed antibiotics   Increase activity slowly   Complete by: As directed         Medication List     STOP taking these medications    ALPRAZolam 0.5 MG tablet Commonly known as: XANAX   atorvastatin 10 MG tablet Commonly known as: LIPITOR   budesonide-formoterol 80-4.5 MCG/ACT inhaler Commonly known as: SYMBICORT Replaced by: Advair HFA 45-21 MCG/ACT inhaler   busPIRone 5 MG tablet Commonly known as: BUSPAR   doxycycline 100 MG capsule Commonly known as: VIBRAMYCIN   hydrOXYzine 25 MG tablet Commonly known as: ATARAX/VISTARIL   methylPREDNISolone 4 MG Tbpk tablet Commonly known as: MEDROL DOSEPAK   predniSONE 10 MG tablet Commonly known as: DELTASONE   promethazine 25 MG tablet Commonly known as: PHENERGAN       TAKE these medications    Advair HFA 45-21 MCG/ACT inhaler Generic drug: fluticasone-salmeterol Inhale 2 puffs into the lungs 2 (two) times daily. Replaces: budesonide-formoterol 80-4.5 MCG/ACT inhaler   albuterol 108 (90 Base) MCG/ACT inhaler Commonly known as: VENTOLIN HFA Inhale 2 puffs into the lungs every 4 (four) hours as needed for wheezing or shortness of breath.   amoxicillin-clavulanate 875-125 MG tablet Commonly known as: AUGMENTIN Take 1 tablet by mouth every 12 (twelve) hours for 12 days.   amphetamine-dextroamphetamine 20 MG tablet Commonly known as: ADDERALL Take 20 mg by mouth 3 (three) times daily.   ferrous sulfate 324 MG Tbec Take 1 tablet (324 mg total) by mouth daily with breakfast.   gabapentin 300 MG capsule Commonly known as: Neurontin Take 1 capsule (300 mg total) by mouth 3 (three) times daily.   guaiFENesin-dextromethorphan 100-10 MG/5ML syrup Commonly known as: ROBITUSSIN DM Take 5 mLs by mouth every 4 (four) hours as needed for cough.   meclizine 12.5 MG tablet Commonly known as: ANTIVERT Take 12.5 mg by mouth 3 (three) times daily as needed for dizziness.    Milk Thistle 1000 MG Caps Take 1,000 mg by mouth daily.   nicotine 7 mg/24hr patch Commonly known as: NICODERM CQ - dosed in mg/24 hr Place 1 patch (7 mg total) onto the skin daily for 28 days.   omeprazole 40 MG capsule Commonly known as: PRILOSEC Take 40 mg by mouth daily.   rivaroxaban 20 MG Tabs tablet Commonly known as: XARELTO Take 20 mg by mouth daily.   Suboxone 8-2 MG Film Generic drug: Buprenorphine HCl-Naloxone HCl Take 1 Film by mouth in the morning, at noon, and at bedtime.   triamcinolone cream 0.5 % Commonly known as: KENALOG Apply 1 application topically 2 (two) times daily as needed.        No Known Allergies   Procedures /Studies:   CT Angio Chest PE W and/or Wo Contrast  Result Date: 01/22/2021 CLINICAL DATA:  Recent diagnosis of pneumonia, continued chest pain and pressure, shortness of breath EXAM: CT ANGIOGRAPHY CHEST WITH CONTRAST TECHNIQUE: Multidetector CT imaging of the chest was performed using the standard protocol during  bolus administration of intravenous contrast. Multiplanar CT image reconstructions and MIPs were obtained to evaluate the vascular anatomy. CONTRAST:  OMNIPAQUE IOHEXOL 350 MG/ML SOLN COMPARISON:  None. FINDINGS: Cardiovascular: Satisfactory opacification of the pulmonary arteries to the segmental level. No evidence of pulmonary embolism. Normal heart size. No pericardial effusion. Mediastinum/Nodes: Numerous enlarged pretracheal and AP window lymph nodes, largest AP window nodes measuring up to 2.8 x 1.9 cm (series 4, image 37). Thyroid gland, trachea, and esophagus demonstrate no significant findings. Lungs/Pleura: There is a masslike consolidation of the paramedian left upper lobe with subtle internal cavitation, measuring approximately 7.7 x 4.6 cm (series 6, image 64). Background of very fine centrilobular nodularity, most concentrated in the lung apices. Trace left pleural effusion and associated atelectasis or  consolidation. Upper Abdomen: No acute abnormality. Musculoskeletal: No chest wall abnormality. No acute or significant osseous findings. Review of the MIP images confirms the above findings. IMPRESSION: 1. Negative examination for pulmonary embolism. 2. There is a masslike consolidation of the paramedian left upper lobe with subtle internal cavitation, measuring approximately 7.7 x 4.6 cm. 3. Numerous enlarged pretracheal and AP window lymph nodes, largest AP window nodes measuring up to 2.8 x 1.9 cm. 4. Although possibly reflecting lobar pneumonia and associated reactive adenopathy, this appearance is substantially concerning for primary lung malignancy. At minimum, recommend follow-up CT in 4-6 weeks to assess for resolution if presentation is clinically infectious. 5. Background of very fine centrilobular nodularity, most concentrated in the lung apices, most commonly seen in smoking-related respiratory bronchiolitis. 6. Trace left pleural effusion and associated atelectasis or consolidation. Electronically Signed   By: Jearld Lesch M.D.   On: 01/22/2021 14:26   DG Chest Port 1 View  Result Date: 01/22/2021 CLINICAL DATA:  Cough EXAM: PORTABLE CHEST 1 VIEW COMPARISON:  01/11/2019 FINDINGS: There is a persistent left upper lobe opacity. Additional persistent patchy density at the left lung base. No pleural effusion. Stable cardiomediastinal contours. IMPRESSION: Persistent left upper lobe and lung base opacities. Continued follow-up is recommended. Electronically Signed   By: Guadlupe Spanish M.D.   On: 01/22/2021 12:02   DG Chest Port 1 View  Result Date: 01/10/2021 CLINICAL DATA:  Chest pain for 3 days EXAM: PORTABLE CHEST 1 VIEW COMPARISON:  12/09/2019 FINDINGS: New left upper lobe opacity reaching the hilum. No detected volume loss or change in mediastinal contours. Indistinct left lower lobe airspace disease. No edema, effusion, or pneumothorax. No cardiomegaly. IMPRESSION: Left upper and lower  airspace disease compatible with pneumonia in the appropriate clinical setting. Followup PA and lateral chest X-ray is recommended in 3-4 weeks following trial of antibiotic therapy to ensure resolution and exclude underlying malignancy. Electronically Signed   By: Tiburcio Pea M.D.   On: 01/10/2021 07:55    Subjective:   Patient was seen and examined 01/24/2021, 10:38 AM Patient stable today. No acute distress.  No issues overnight Stable for discharge.  Discharge Exam:    Vitals:   01/23/21 2050 01/24/21 0549 01/24/21 0745 01/24/21 0927  BP: 106/76 93/61  115/76  Pulse: 68 66  76  Resp: 19 16  17   Temp: 98.5 F (36.9 C) 97.7 F (36.5 C)  97.6 F (36.4 C)  TempSrc: Oral Oral  Oral  SpO2: 97% 97% 96% 99%  Weight:      Height:        General: Pt lying comfortably in bed & appears in no obvious distress. Cardiovascular: S1 & S2 heard, RRR, S1/S2 +. No murmurs, rubs, gallops  or clicks. No JVD or pedal edema. Respiratory: Clear to auscultation without wheezing, rhonchi or crackles. No increased work of breathing. Abdominal:  Non-distended, non-tender & soft. No organomegaly or masses appreciated. Normal bowel sounds heard. CNS: Alert and oriented. No focal deficits. Extremities: no edema, no cyanosis      The results of significant diagnostics from this hospitalization (including imaging, microbiology, ancillary and laboratory) are listed below for reference.      Microbiology:   Recent Results (from the past 240 hour(s))  Resp Panel by RT-PCR (Flu A&B, Covid) Nasopharyngeal Swab     Status: None   Collection Time: 01/22/21  3:28 PM   Specimen: Nasopharyngeal Swab; Nasopharyngeal(NP) swabs in vial transport medium  Result Value Ref Range Status   SARS Coronavirus 2 by RT PCR NEGATIVE NEGATIVE Final    Comment: (NOTE) SARS-CoV-2 target nucleic acids are NOT DETECTED.  The SARS-CoV-2 RNA is generally detectable in upper respiratory specimens during the acute phase of  infection. The lowest concentration of SARS-CoV-2 viral copies this assay can detect is 138 copies/mL. A negative result does not preclude SARS-Cov-2 infection and should not be used as the sole basis for treatment or other patient management decisions. A negative result may occur with  improper specimen collection/handling, submission of specimen other than nasopharyngeal swab, presence of viral mutation(s) within the areas targeted by this assay, and inadequate number of viral copies(<138 copies/mL). A negative result must be combined with clinical observations, patient history, and epidemiological information. The expected result is Negative.  Fact Sheet for Patients:  BloggerCourse.com  Fact Sheet for Healthcare Providers:  SeriousBroker.it  This test is no t yet approved or cleared by the [place] FDA and  has been authorized for detection and/or diagnosis of SARS-CoV-2 by FDA under an Emergency Use Authorization (EUA). This EUA will remain  in effect (meaning this test can be used) for the duration of the COVID-19 declaration under Section 564(b)(1) of the Act, 21 U.S.C.section 360bbb-3(b)(1), unless the authorization is terminated  or revoked sooner.       Influenza A by PCR NEGATIVE NEGATIVE Final   Influenza B by PCR NEGATIVE NEGATIVE Final    Comment: (NOTE) The Xpert Xpress SARS-CoV-2/FLU/RSV plus assay is intended as an aid in the diagnosis of influenza from Nasopharyngeal swab specimens and should not be used as a sole basis for treatment. Nasal washings and aspirates are unacceptable for Xpert Xpress SARS-CoV-2/FLU/RSV testing.  Fact Sheet for Patients: BloggerCourse.com  Fact Sheet for Healthcare Providers: SeriousBroker.it  This test is not yet approved or cleared by the [place] FDA and has been authorized for detection and/or diagnosis of SARS-CoV-2  by FDA under an Emergency Use Authorization (EUA). This EUA will remain in effect (meaning this test can be used) for the duration of the COVID-19 declaration under Section 564(b)(1) of the Act, 21 U.S.C. section 360bbb-3(b)(1), unless the authorization is terminated or revoked.  Performed at [facility], [address], [place], [place] [address]   MRSA Next Gen by PCR, Nasal     Status: None   Collection Time: 01/23/21  1:08 PM   Specimen: Nasal Mucosa; Nasal Swab  Result Value Ref Range Status   MRSA by PCR Next Gen NOT DETECTED NOT DETECTED Final    Comment: (NOTE) The GeneXpert MRSA Assay (FDA approved for NASAL specimens only), is one component of a comprehensive MRSA colonization surveillance program. It is not intended to diagnose MRSA infection nor to guide or monitor treatment for MRSA infections.  Test performance is not FDA approved in patients less than 59 years old. Performed at [facility], [address] [place], [place] [address]      Labs:   CBC: Recent Labs  Lab 01/22/21 1220 01/23/21 0523 01/24/21 0555  WBC 7.7 7.1 4.6  NEUTROABS 5.7  --   --   HGB 11.4* 11.4* 12.6*  HCT 33.6* 36.0* 39.2  MCV 90.1 92.1 91.6  PLT 220 203 255   Basic Metabolic Panel: Recent Labs  Lab 01/22/21 1220 01/23/21 0657  NA 135 136  K 3.6 4.2  CL 100 101  CO2 29 29  GLUCOSE 141* 105*  BUN 12 9  CREATININE 0.72 0.57*  CALCIUM 8.6* 8.7*  MG  --  1.8   Liver Function Tests: No results for input(s): AST, ALT, ALKPHOS, BILITOT, PROT, ALBUMIN in the last 168 hours. BNP (last 3 results) Recent Labs    01/10/21 0755 01/22/21 1220  BNP 34.0 34.0   Cardiac Enzymes: No results for input(s): CKTOTAL, CKMB, CKMBINDEX, TROPONINI in the last 168 hours. CBG: No results for input(s): GLUCAP in the last 168 hours. Hgb A1c No results for input(s): HGBA1C in the last 72 hours. Lipid Profile No results for input(s): CHOL, HDL, LDLCALC, TRIG, CHOLHDL, LDLDIRECT in the last 72  hours. Thyroid function studies No results for input(s): TSH, T4TOTAL, T3FREE, THYROIDAB in the last 72 hours.  Invalid input(s): FREET3 Anemia work up Recent Labs    01/22/21 1122 01/22/21 1649  VITAMINB12  --  1,614*  FOLATE  --  30.6  FERRITIN  --  68  TIBC  --  228*  IRON  --  21*  RETICCTPCT 1.0  --    Urinalysis    Component Value Date/Time   COLORURINE YELLOW 01/10/2021 0900   APPEARANCEUR CLEAR 01/10/2021 0900   LABSPEC 1.019 01/10/2021 0900   PHURINE 6.0 01/10/2021 0900   GLUCOSEU NEGATIVE 01/10/2021 0900   HGBUR MODERATE (A) 01/10/2021 0900   BILIRUBINUR NEGATIVE 01/10/2021 0900   KETONESUR NEGATIVE 01/10/2021 0900   PROTEINUR NEGATIVE 01/10/2021 0900   UROBILINOGEN 1.0 02/05/2015 1915   NITRITE NEGATIVE 01/10/2021 0900   LEUKOCYTESUR NEGATIVE 01/10/2021 0900         Time coordinating discharge: Over 45 minutes  SIGNED: Kendell Bane, MD, FACP, FHM. [facility],  Please use [email or web address] to Page If 7PM-7AM, please contact night-coverage [email or web address] 01/24/2021, 10:38 AM

## 2021-02-19 ENCOUNTER — Encounter: Payer: Self-pay | Admitting: Emergency Medicine

## 2021-02-19 ENCOUNTER — Other Ambulatory Visit: Payer: Self-pay

## 2021-02-19 ENCOUNTER — Ambulatory Visit (INDEPENDENT_AMBULATORY_CARE_PROVIDER_SITE_OTHER): Payer: Medicare Other | Admitting: Emergency Medicine

## 2021-02-19 VITALS — BP 118/70 | HR 64 | Temp 97.5°F | Ht 75.0 in | Wt 199.2 lb

## 2021-02-19 DIAGNOSIS — R911 Solitary pulmonary nodule: Secondary | ICD-10-CM

## 2021-02-19 DIAGNOSIS — J449 Chronic obstructive pulmonary disease, unspecified: Secondary | ICD-10-CM | POA: Diagnosis not present

## 2021-02-19 DIAGNOSIS — J850 Gangrene and necrosis of lung: Secondary | ICD-10-CM | POA: Diagnosis not present

## 2021-02-19 DIAGNOSIS — Z86711 Personal history of pulmonary embolism: Secondary | ICD-10-CM | POA: Diagnosis not present

## 2021-02-19 DIAGNOSIS — Z720 Tobacco use: Secondary | ICD-10-CM

## 2021-02-19 NOTE — Patient Instructions (Signed)
We will repeat your CT scan of the chest at [facility] to look for clearance in the left upper lobe. Follow Dr. Delton Coombes next available after your CT so we can review. If there is residual abnormality in the left lung then we will talk about performing a bronchoscopy to better examine that area.

## 2021-02-19 NOTE — Progress Notes (Signed)
Subjective:    Patient ID: [name], male    DOB: October 10, 1960, 60 y.o.   MRN: [id number]  HPI 60 year old smoker (35 pack years) with a history of protein S deficiency and pulmonary embolism (Xarelto), GERD with a hiatal hernia, bipolar disorder, COPD/asthma. Hospitalized in late September 2022 with a suspected left upper lobe community-acquired pneumonia, treated with ceftriaxone and azithromycin, discharged on doxycycline.  Readmitted 01/23/2021 with persistent symptoms.  A CT chest showed no evidence of pulmonary embolism but left upper lobe cavitary lesion.  He was treated with vancomycin and cefepime, was able to be discharged on 01/24/2021. Reports that he still has some intermittent chills. He did feel better after the hospitalization, but unclear whether back to baseline. Has lost 20 lbs over the last 2 months. Some persistent cough, non-productive.   CT chest 01/22/2021 reviewed by me, shows a left upper lobe masslike consolidation with some subtle internal cavitation, numerous pretracheal and mediastinal enlarged lymph nodes   Review of Systems As per HPI  Past Medical History:  Diagnosis Date   Anxiety    Arthritis    Asthma    Bipolar disorder (HCC)    Chronic pain    COPD (chronic obstructive pulmonary disease) (HCC)    Degenerative disk disease    GERD (gastroesophageal reflux disease)    Hiatal hernia    History of pneumonia    PE (pulmonary embolism) 2012   Protein S deficiency (HCC)      Family History  Problem Relation Age of Onset   Coronary artery disease Mother    Coronary artery disease Father    Diabetes Other      Social History   Socioeconomic History   Marital status: Legally Separated    Spouse name: Not on file   Number of children: Not on file   Years of education: Not on file   Highest education level: Not on file  Occupational History   Not on file  Tobacco Use   Smoking status: Every Day    Packs/day: 0.25    Years: 35.00     Pack years: 8.75    Types: Cigarettes   Smokeless tobacco: Never   Tobacco comments:    Smokes 1/3 pack a day ARJ 02/19/21  Vaping Use   Vaping Use: Every day  Substance and Sexual Activity   Alcohol use: No    Alcohol/week: 0.0 standard drinks   Drug use: Yes    Comment: suboxone, adderall, ativan, meth, xanaa   Sexual activity: Not on file  Other Topics Concern   Not on file  Social History Narrative   Not on file   Social Determinants of Health   Financial Resource Strain: Not on file  Food Insecurity: Not on file  Transportation Needs: Not on file  Physical Activity: Not on file  Stress: Not on file  Social Connections: Not on file  Intimate Partner Violence: Not on file     No Known Allergies   Outpatient Medications Prior to Visit  Medication Sig Dispense Refill   albuterol (PROVENTIL HFA;VENTOLIN HFA) 108 (90 BASE) MCG/ACT inhaler Inhale 2 puffs into the lungs every 4 (four) hours as needed for wheezing or shortness of breath.      amphetamine-dextroamphetamine (ADDERALL) 20 MG tablet Take 20 mg by mouth 3 (three) times daily.     clonazePAM (KLONOPIN) 1 MG tablet Take 1 mg by mouth 2 (two) times daily.     feeding supplement (ENSURE ENLIVE / ENSURE PLUS) LIQD  Take 237 mLs by mouth 2 (two) times daily between meals. 237 mL 12   fluticasone-salmeterol (ADVAIR HFA) 45-21 MCG/ACT inhaler Inhale 2 puffs into the lungs 2 (two) times daily. 1 each 12   meclizine (ANTIVERT) 12.5 MG tablet Take 12.5 mg by mouth 3 (three) times daily as needed for dizziness.     Milk Thistle 1000 MG CAPS Take 1,000 mg by mouth daily.      omeprazole (PRILOSEC) 40 MG capsule Take 40 mg by mouth daily.     rivaroxaban (XARELTO) 20 MG TABS tablet Take 20 mg by mouth daily.     SUBOXONE 8-2 MG FILM Take 1 Film by mouth in the morning, at noon, and at bedtime.      triamcinolone cream (KENALOG) 0.5 % Apply 1 application topically 2 (two) times daily as needed.      ferrous sulfate 324 MG TBEC Take  1 tablet (324 mg total) by mouth daily with breakfast. (Patient not taking: Reported on 02/19/2021) 30 tablet 2   fluticasone furoate-vilanterol (BREO ELLIPTA) 100-25 MCG/ACT AEPB Inhale 1 puff into the lungs daily.     gabapentin (NEURONTIN) 300 MG capsule Take 1 capsule (300 mg total) by mouth 3 (three) times daily. 90 capsule 0   guaiFENesin-dextromethorphan (ROBITUSSIN DM) 100-10 MG/5ML syrup Take 5 mLs by mouth every 4 (four) hours as needed for cough. (Patient not taking: Reported on 02/19/2021) 118 mL 0   nicotine (NICODERM CQ - DOSED IN MG/24 HR) 7 mg/24hr patch Place 1 patch (7 mg total) onto the skin daily for 28 days. (Patient not taking: Reported on 02/19/2021) 28 patch 2   No facility-administered medications prior to visit.         Objective:   Physical Exam  Vitals:   02/19/21 1400  BP: 118/70  Pulse: 64  Temp: (!) 97.5 F (36.4 C)  TempSrc: Oral  SpO2: 98%  Weight: 199 lb 3.2 oz (90.4 kg)  Height: 6\' 3"  (1.905 m)    Gen: Pleasant, well-nourished, in no distress,  normal affect  ENT: No lesions,  mouth clear,  oropharynx clear, no postnasal drip  Neck: No JVD, no stridor  Lungs: No use of accessory muscles, no crackles or wheezing on normal respiration, no wheeze on forced expiration  Cardiovascular: RRR, heart sounds normal, no murmur or gallops, no peripheral edema  Musculoskeletal: No deformities, no cyanosis or clubbing  Neuro: alert, awake, non focal  Skin: Warm, no lesions or rash     Assessment & Plan:  Necrotizing pneumonia  LUL anteromedially  Left upper lobe masslike lesion in the setting of clinical pneumonia, all consistent with necrotic pneumonia and lung abscess.  He was treated and is clinically improved, does still have some cough and some intermittent chills.  Plan to repeat his CT scan of the chest now to look for interval improvement, any persistent disease.  He may need further antibiotics.  If his lesion is unchanged and he needs  bronchoscopy to evaluate for possible malignancy or opportunistic organism.  COPD (chronic obstructive pulmonary disease) ([place]) Plan to continue his current bronchodilators as ordered  Hx of pulmonary embolus History of pulmonary embolism and protein S deficiency.  He will need anticoagulation long-term.  Currently on Xarelto.  Tobacco use Clearly needs tobacco cessation.  He is currently in the contemplative stage.  We discussed today.   Baltazar Apo, MD, PhD 03/28/2021, 8:22 AM [facility] [phone] or if no answer before 7:00PM call [phone] For any issues  after 7:00PM please call eLink [phone]

## 2021-03-15 ENCOUNTER — Ambulatory Visit (HOSPITAL_COMMUNITY)
Admission: RE | Admit: 2021-03-15 | Discharge: 2021-03-15 | Disposition: A | Discharge status: Home / Self Care | Payer: Medicare Other | Source: Ambulatory Visit | Attending: Emergency Medicine | Admitting: Emergency Medicine

## 2021-03-15 DIAGNOSIS — R911 Solitary pulmonary nodule: Secondary | ICD-10-CM | POA: Diagnosis not present

## 2021-03-28 ENCOUNTER — Encounter: Payer: Self-pay | Admitting: Emergency Medicine

## 2021-03-28 DIAGNOSIS — Z720 Tobacco use: Secondary | ICD-10-CM | POA: Insufficient documentation

## 2021-03-28 NOTE — Assessment & Plan Note (Signed)
Left upper lobe masslike lesion in the setting of clinical pneumonia, all consistent with necrotic pneumonia and lung abscess.  He was treated and is clinically improved, does still have some cough and some intermittent chills.  Plan to repeat his CT scan of the chest now to look for interval improvement, any persistent disease.  He may need further antibiotics.  If his lesion is unchanged and he needs bronchoscopy to evaluate for possible malignancy or opportunistic organism.

## 2021-03-28 NOTE — Assessment & Plan Note (Signed)
Clearly needs tobacco cessation.  He is currently in the contemplative stage.  We discussed today.

## 2021-03-28 NOTE — Assessment & Plan Note (Signed)
Plan to continue his current bronchodilators as ordered

## 2021-03-28 NOTE — Assessment & Plan Note (Signed)
History of pulmonary embolism and protein S deficiency.  He will need anticoagulation long-term.  Currently on Xarelto.

## 2021-04-03 ENCOUNTER — Telehealth: Payer: Self-pay | Admitting: Emergency Medicine

## 2021-04-03 NOTE — Telephone Encounter (Signed)
Please let them know that I reviewed his CT chest.  The left upper lobe opacity in question is almost fully resolved - this is consistent with a resolving PNA, not cancer. This is good news.  He has a new area of inflammation in his right upper lobe, again suggestive of inflammation or infection. We will need to follow up to determine why he would develop recurrent pneumonias, how to prevent this from happening. If he is having new symptoms consistent with infection then please make an OV with APP to evaluate this. Otherwise we can keep his existing OV w me.

## 2021-04-03 NOTE — Telephone Encounter (Signed)
Pt sister in law- Paymon Rosensteel calling on behalf of the pt. Pt is supposed to f/u with RB in January for CT f/u and determine if pt needs to set up biopsy. Marylene Land would like to get biopsy set up if that's what RB wants to  do. Per Marylene Land, pt is not doing well and losing a lot of weight. Please advise [phone]

## 2021-04-03 NOTE — Telephone Encounter (Signed)
Spoke with Marylene Land, pt's sister in law  Pt had chest ct done 03/15/21  He is scheduled for ov with RB to talk about results on 05/07/21  She is asking if the results can be given sooner  She feels like he will need bx and does not want to wait any longer  Can you look at his scan? Thanks!

## 2021-04-03 NOTE — Telephone Encounter (Signed)
Called and spoke with pt's sister n law Marylene Land letting her know the results of the CT per RB and she verbalized understanding. Nothing further needed.

## 2021-04-19 ENCOUNTER — Ambulatory Visit: Payer: Medicare Other | Admitting: Emergency Medicine

## 2021-05-07 ENCOUNTER — Encounter: Payer: Self-pay | Admitting: Emergency Medicine

## 2021-05-07 ENCOUNTER — Ambulatory Visit (INDEPENDENT_AMBULATORY_CARE_PROVIDER_SITE_OTHER): Payer: Medicare Other | Admitting: Emergency Medicine

## 2021-05-07 ENCOUNTER — Other Ambulatory Visit: Payer: Self-pay

## 2021-05-07 DIAGNOSIS — J850 Gangrene and necrosis of lung: Secondary | ICD-10-CM | POA: Diagnosis not present

## 2021-05-07 DIAGNOSIS — J449 Chronic obstructive pulmonary disease, unspecified: Secondary | ICD-10-CM

## 2021-05-07 DIAGNOSIS — Z720 Tobacco use: Secondary | ICD-10-CM | POA: Diagnosis not present

## 2021-05-07 MED ORDER — STIOLTO RESPIMAT 2.5-2.5 MCG/ACT IN AERS: 2.0000 | INHALATION_SPRAY | Freq: Every day | RESPIRATORY_TRACT | 0 refills | Status: DC

## 2021-05-07 NOTE — Addendum Note (Signed)
Addended by: Dorisann Frames R on: 05/07/2021 12:07 PM   Modules accepted: Orders

## 2021-05-07 NOTE — Patient Instructions (Addendum)
We will try starting Stiolto 2 puffs once daily.  We will send prescription for this to your pharmacy. We will refill your albuterol.  You can use 2 puffs up to every 4 hours if needed for shortness of breath, chest tightness, wheezing. We will plan to repeat your CT scan of the chest in June 2023 to compare with prior. Please continue your Xarelto as you have been taking it. Work on decreasing your cigarettes.  We can talk about trying to set a quit date at some point in the future. Follow with Dr. Lamonte Sakai in June after your CT scan so we can review the results together.

## 2021-05-07 NOTE — Assessment & Plan Note (Signed)
We will try starting Stiolto 2 puffs once daily.  We will send prescription for this to your pharmacy. We will refill your albuterol.  You can use 2 puffs up to every 4 hours if needed for shortness of breath, chest tightness, wheezing. Work on decreasing your cigarettes.  We can talk about trying to set a quit date at some point in the future. Follow with Dr. Delton Coombes in June after your CT scan so we can review the results together.

## 2021-05-07 NOTE — Assessment & Plan Note (Signed)
Discussed cessation with him today.  He is not ready to stop completely but he does want to quit at some point.  He also wants to cut down.  We talked about strategies to decrease today.

## 2021-05-07 NOTE — Progress Notes (Signed)
Subjective:    Patient ID: [name], male    DOB: 12-27-1960, 61 y.o.   MRN: [id number]  HPI 61 year old smoker (35 pack years) with a history of protein S deficiency and pulmonary embolism (Xarelto), GERD with a hiatal hernia, bipolar disorder, COPD/asthma. Hospitalized in late September 2022 with a suspected left upper lobe community-acquired pneumonia, treated with ceftriaxone and azithromycin, discharged on doxycycline.  Readmitted 01/23/2021 with persistent symptoms.  A CT chest showed no evidence of pulmonary embolism but left upper lobe cavitary lesion.  He was treated with vancomycin and cefepime, was able to be discharged on 01/24/2021. Reports that he still has some intermittent chills. He did feel better after the hospitalization, but unclear whether back to baseline. Has lost 20 lbs over the last 2 months. Some persistent cough, non-productive.   CT chest 01/22/2021 reviewed by me, shows a left upper lobe masslike consolidation with some subtle internal cavitation, numerous pretracheal and mediastinal enlarged lymph nodes   ROV 05/07/21 --61 year old gentleman with history of tobacco use and protein S deficiency, pulmonary embolism on Xarelto.  Followed for this as well as COPD/asthma, GERD with hiatal hernia.  I saw him after he was admitted for complicated pneumonia with left upper lobe cavitary lesion. Consistent with necrotizing PNA.  He has documented aspiration on swallow eval. Smoking 0.5 pk/day. He is out of Advair and albuterol.   Chest 03/15/2021 reviewed by me, shows near complete resolution of the large left upper lobe area of airspace consolidation with some post infectious scar.  There was some patchy peribronchial thickening and groundglass nodularity that was new.  Stable mediastinal adenopathy.   Review of Systems As per HPI  Past Medical History:  Diagnosis Date   Anxiety    Arthritis    Asthma    Bipolar disorder (HCC)    Chronic pain    COPD (chronic  obstructive pulmonary disease) (HCC)    Degenerative disk disease    GERD (gastroesophageal reflux disease)    Hiatal hernia    History of pneumonia    PE (pulmonary embolism) 2012   Protein S deficiency (HCC)      Family History  Problem Relation Age of Onset   Coronary artery disease Mother    Coronary artery disease Father    Diabetes Other      Social History   Socioeconomic History   Marital status: Legally Separated    Spouse name: Not on file   Number of children: Not on file   Years of education: Not on file   Highest education level: Not on file  Occupational History   Not on file  Tobacco Use   Smoking status: Every Day    Packs/day: 0.25    Years: 35.00    Pack years: 8.75    Types: Cigarettes   Smokeless tobacco: Never   Tobacco comments:    Smokes 13 cigarettes  a day ARJ 05/07/21  Vaping Use   Vaping Use: Every day  Substance and Sexual Activity   Alcohol use: No    Alcohol/week: 0.0 standard drinks   Drug use: Yes    Comment: suboxone, adderall, ativan, meth, xanaa   Sexual activity: Not on file  Other Topics Concern   Not on file  Social History Narrative   Not on file   Social Determinants of Health   Financial Resource Strain: Not on file  Food Insecurity: Not on file  Transportation Needs: Not on file  Physical Activity: Not on file  Stress: Not on file  Social Connections: Not on file  Intimate Partner Violence: Not on file     No Known Allergies   Outpatient Medications Prior to Visit  Medication Sig Dispense Refill   albuterol (PROVENTIL HFA;VENTOLIN HFA) 108 (90 BASE) MCG/ACT inhaler Inhale 2 puffs into the lungs every 4 (four) hours as needed for wheezing or shortness of breath.      ALPRAZolam (XANAX) 0.5 MG tablet Take 0.5 mg by mouth 3 (three) times daily.     amphetamine-dextroamphetamine (ADDERALL) 20 MG tablet Take 20 mg by mouth 3 (three) times daily.     clonazePAM (KLONOPIN) 1 MG tablet Take 1 mg by mouth 2 (two) times  daily.     guaiFENesin-dextromethorphan (ROBITUSSIN DM) 100-10 MG/5ML syrup Take 5 mLs by mouth every 4 (four) hours as needed for cough. 118 mL 0   meclizine (ANTIVERT) 12.5 MG tablet Take 12.5 mg by mouth 3 (three) times daily as needed for dizziness.     Milk Thistle 1000 MG CAPS Take 1,000 mg by mouth daily.      omeprazole (PRILOSEC) 40 MG capsule Take 40 mg by mouth daily.     rivaroxaban (XARELTO) 20 MG TABS tablet Take 20 mg by mouth daily.     SUBOXONE 8-2 MG FILM Take 1 Film by mouth in the morning, at noon, and at bedtime.      triamcinolone cream (KENALOG) 0.5 % Apply 1 application topically 2 (two) times daily as needed.      ferrous sulfate 324 MG TBEC Take 1 tablet (324 mg total) by mouth daily with breakfast. (Patient not taking: Reported on 02/19/2021) 30 tablet 2   fluticasone furoate-vilanterol (BREO ELLIPTA) 100-25 MCG/ACT AEPB Inhale 1 puff into the lungs daily. (Patient not taking: Reported on 05/07/2021)     fluticasone-salmeterol (ADVAIR HFA) 45-21 MCG/ACT inhaler Inhale 2 puffs into the lungs 2 (two) times daily. (Patient not taking: Reported on 05/07/2021) 1 each 12   gabapentin (NEURONTIN) 300 MG capsule Take 1 capsule (300 mg total) by mouth 3 (three) times daily. 90 capsule 0   No facility-administered medications prior to visit.         Objective:   Physical Exam  Vitals:   05/07/21 1100  BP: 116/68  Pulse: 72  Temp: 97.7 F (36.5 C)  TempSrc: Oral  SpO2: 96%  Weight: 198 lb 12.8 oz (90.2 kg)  Height: 6\' 2"  (1.88 m)    Gen: Pleasant, well-nourished, in no distress,  normal affect  ENT: No lesions,  mouth clear,  oropharynx clear, no postnasal drip  Neck: No JVD, no stridor  Lungs: No use of accessory muscles, no crackles or wheezing on normal respiration, no wheeze on forced expiration  Cardiovascular: RRR, heart sounds normal, no murmur or gallops, no peripheral edema  Musculoskeletal: No deformities, no cyanosis or clubbing  Neuro: alert,  awake, non focal  Skin: Warm, no lesions or rash     Assessment & Plan:  Necrotizing pneumonia  LUL anteromedially  Markedly improved on CT from December 2022.  He did still have some areas of inflammation, nodularity.  I think we need to follow in 6 months to ensure no evidence of an evolving process.  He does have history of aspiration and we talked about aspiration precautions today.  COPD (chronic obstructive pulmonary disease) (HCC) We will try starting Stiolto 2 puffs once daily.  We will send prescription for this to your pharmacy. We will refill your albuterol.  You can use  2 puffs up to every 4 hours if needed for shortness of breath, chest tightness, wheezing. Work on decreasing your cigarettes.  We can talk about trying to set a quit date at some point in the future. Follow with Dr. Delton CoombesByrum in June after your CT scan so we can review the results together.  Tobacco use Discussed cessation with him today.  He is not ready to stop completely but he does want to quit at some point.  He also wants to cut down.  We talked about strategies to decrease today.   Levy Pupaobert Trichelle Lehan, MD, PhD 05/07/2021, 11:38 AM [facility] [phone] or if no answer before 7:00PM call [phone] For any issues after 7:00PM please call eLink [phone]

## 2021-05-07 NOTE — Assessment & Plan Note (Signed)
Markedly improved on CT from December 2022.  He did still have some areas of inflammation, nodularity.  I think we need to follow in 6 months to ensure no evidence of an evolving process.  He does have history of aspiration and we talked about aspiration precautions today.

## 2021-07-13 ENCOUNTER — Encounter (HOSPITAL_COMMUNITY): Payer: Self-pay | Admitting: *Deleted

## 2021-07-13 ENCOUNTER — Emergency Department (HOSPITAL_COMMUNITY): Payer: Medicare Other

## 2021-07-13 ENCOUNTER — Inpatient Hospital Stay (HOSPITAL_COMMUNITY)
Admission: EM | Admit: 2021-07-13 | Discharge: 2021-07-15 | DRG: 178 | Disposition: A | Discharge status: Home / Self Care | Payer: Medicare Other | Attending: Family Medicine | Admitting: Family Medicine

## 2021-07-13 ENCOUNTER — Other Ambulatory Visit: Payer: Self-pay

## 2021-07-13 DIAGNOSIS — F319 Bipolar disorder, unspecified: Secondary | ICD-10-CM | POA: Diagnosis present

## 2021-07-13 DIAGNOSIS — Z9049 Acquired absence of other specified parts of digestive tract: Secondary | ICD-10-CM | POA: Diagnosis not present

## 2021-07-13 DIAGNOSIS — J690 Pneumonitis due to inhalation of food and vomit: Principal | ICD-10-CM

## 2021-07-13 DIAGNOSIS — Z79899 Other long term (current) drug therapy: Secondary | ICD-10-CM | POA: Diagnosis not present

## 2021-07-13 DIAGNOSIS — F1920 Other psychoactive substance dependence, uncomplicated: Secondary | ICD-10-CM | POA: Diagnosis not present

## 2021-07-13 DIAGNOSIS — J441 Chronic obstructive pulmonary disease with (acute) exacerbation: Secondary | ICD-10-CM

## 2021-07-13 DIAGNOSIS — E785 Hyperlipidemia, unspecified: Secondary | ICD-10-CM | POA: Diagnosis present

## 2021-07-13 DIAGNOSIS — J449 Chronic obstructive pulmonary disease, unspecified: Secondary | ICD-10-CM | POA: Diagnosis not present

## 2021-07-13 DIAGNOSIS — F17210 Nicotine dependence, cigarettes, uncomplicated: Secondary | ICD-10-CM | POA: Diagnosis present

## 2021-07-13 DIAGNOSIS — R0902 Hypoxemia: Secondary | ICD-10-CM | POA: Diagnosis present

## 2021-07-13 DIAGNOSIS — F419 Anxiety disorder, unspecified: Secondary | ICD-10-CM | POA: Diagnosis present

## 2021-07-13 DIAGNOSIS — Z7901 Long term (current) use of anticoagulants: Secondary | ICD-10-CM | POA: Diagnosis not present

## 2021-07-13 DIAGNOSIS — F1120 Opioid dependence, uncomplicated: Secondary | ICD-10-CM | POA: Diagnosis present

## 2021-07-13 DIAGNOSIS — R590 Localized enlarged lymph nodes: Secondary | ICD-10-CM | POA: Diagnosis present

## 2021-07-13 DIAGNOSIS — M1990 Unspecified osteoarthritis, unspecified site: Secondary | ICD-10-CM | POA: Diagnosis present

## 2021-07-13 DIAGNOSIS — G8929 Other chronic pain: Secondary | ICD-10-CM | POA: Diagnosis present

## 2021-07-13 DIAGNOSIS — Z20822 Contact with and (suspected) exposure to covid-19: Secondary | ICD-10-CM | POA: Diagnosis present

## 2021-07-13 DIAGNOSIS — D6859 Other primary thrombophilia: Secondary | ICD-10-CM | POA: Diagnosis present

## 2021-07-13 DIAGNOSIS — R042 Hemoptysis: Secondary | ICD-10-CM

## 2021-07-13 DIAGNOSIS — K5900 Constipation, unspecified: Secondary | ICD-10-CM | POA: Diagnosis present

## 2021-07-13 DIAGNOSIS — Z86711 Personal history of pulmonary embolism: Secondary | ICD-10-CM | POA: Diagnosis present

## 2021-07-13 DIAGNOSIS — Z8701 Personal history of pneumonia (recurrent): Secondary | ICD-10-CM | POA: Diagnosis not present

## 2021-07-13 DIAGNOSIS — K219 Gastro-esophageal reflux disease without esophagitis: Secondary | ICD-10-CM | POA: Diagnosis present

## 2021-07-13 DIAGNOSIS — J189 Pneumonia, unspecified organism: Secondary | ICD-10-CM | POA: Diagnosis not present

## 2021-07-13 DIAGNOSIS — R059 Cough, unspecified: Secondary | ICD-10-CM | POA: Diagnosis present

## 2021-07-13 LAB — COMPREHENSIVE METABOLIC PANEL
ALT: 13 U/L (ref 0–44)
AST: 11 U/L — ABNORMAL LOW (ref 15–41)
Albumin: 3.3 g/dL — ABNORMAL LOW (ref 3.5–5.0)
Alkaline Phosphatase: 67 U/L (ref 38–126)
Anion gap: 8 (ref 5–15)
BUN: 20 mg/dL (ref 6–20)
CO2: 25 mmol/L (ref 22–32)
Calcium: 8.8 mg/dL — ABNORMAL LOW (ref 8.9–10.3)
Chloride: 103 mmol/L (ref 98–111)
Creatinine, Ser: 0.74 mg/dL (ref 0.61–1.24)
GFR, Estimated: >60 mL/min (ref >60)
Glucose, Bld: 126 mg/dL — ABNORMAL HIGH (ref 70–99)
Potassium: 3.7 mmol/L (ref 3.5–5.1)
Sodium: 136 mmol/L (ref 135–145)
Total Bilirubin: 0.3 mg/dL (ref 0.3–1.2)
Total Protein: 7.5 g/dL (ref 6.5–8.1)

## 2021-07-13 LAB — CBC WITH DIFFERENTIAL/PLATELET
Abs Immature Granulocytes: 0.02 10*3/uL (ref 0.00–0.07)
Basophils Absolute: 0.1 10*3/uL (ref 0.0–0.1)
Basophils Relative: 1 %
Eosinophils Absolute: 0.2 10*3/uL (ref 0.0–0.5)
Eosinophils Relative: 2 %
HCT: 35.4 % — ABNORMAL LOW (ref 39.0–52.0)
Hemoglobin: 11.5 g/dL — ABNORMAL LOW (ref 13.0–17.0)
Immature Granulocytes: 0 %
Lymphocytes Relative: 17 %
Lymphs Abs: 1.3 10*3/uL (ref 0.7–4.0)
MCH: 28.8 pg (ref 26.0–34.0)
MCHC: 32.5 g/dL (ref 30.0–36.0)
MCV: 88.7 fL (ref 80.0–100.0)
Monocytes Absolute: 0.7 10*3/uL (ref 0.1–1.0)
Monocytes Relative: 9 %
Neutro Abs: 5.6 10*3/uL (ref 1.7–7.7)
Neutrophils Relative %: 71 %
Platelets: 260 10*3/uL (ref 150–400)
RBC: 3.99 MIL/uL — ABNORMAL LOW (ref 4.22–5.81)
RDW: 12.8 % (ref 11.5–15.5)
WBC: 7.9 10*3/uL (ref 4.0–10.5)
nRBC: 0 % (ref 0.0–0.2)

## 2021-07-13 LAB — TROPONIN I (HIGH SENSITIVITY)
Troponin I (High Sensitivity): <2 ng/L (ref <18)
Troponin I (High Sensitivity): 2 ng/L (ref <18)

## 2021-07-13 LAB — RESP PANEL BY RT-PCR (FLU A&B, COVID) ARPGX2
Influenza A by PCR: NEGATIVE
Influenza B by PCR: NEGATIVE
SARS Coronavirus 2 by RT PCR: NEGATIVE

## 2021-07-13 MED ORDER — GUAIFENESIN-DM 100-10 MG/5ML PO SYRP: 10.0000 mL | ORAL_SOLUTION | Freq: Four times a day (QID) | ORAL | Status: DC | PRN

## 2021-07-13 MED ORDER — IOHEXOL 350 MG/ML SOLN
75.0000 mL | Freq: Once | INTRAVENOUS | Status: AC | PRN
  Administered 2021-07-13: 75 mL via INTRAVENOUS

## 2021-07-13 MED ORDER — ATORVASTATIN CALCIUM 10 MG PO TABS
10.0000 mg | ORAL_TABLET | Freq: Every day | ORAL | Status: DC
  Administered 2021-07-14: 10 mg via ORAL
  Filled 2021-07-13: qty 1

## 2021-07-13 MED ORDER — ONDANSETRON HCL 4 MG PO TABS: 4.0000 mg | ORAL_TABLET | Freq: Four times a day (QID) | ORAL | Status: DC | PRN

## 2021-07-13 MED ORDER — PANTOPRAZOLE SODIUM 40 MG PO TBEC
40.0000 mg | DELAYED_RELEASE_TABLET | Freq: Every day | ORAL | Status: DC
Start: 2021-07-14 — End: 2021-07-15
  Administered 2021-07-14 – 2021-07-15 (×2): 40 mg via ORAL
  Filled 2021-07-13 (×2): qty 1

## 2021-07-13 MED ORDER — SODIUM CHLORIDE 0.9 % IV SOLN
500.0000 mg | INTRAVENOUS | Status: DC
  Administered 2021-07-13 – 2021-07-14 (×2): 500 mg via INTRAVENOUS
  Filled 2021-07-13 (×2): qty 5

## 2021-07-13 MED ORDER — CLONAZEPAM 0.5 MG PO TABS
1.0000 mg | ORAL_TABLET | Freq: Two times a day (BID) | ORAL | Status: DC
  Administered 2021-07-13 – 2021-07-14 (×2): 1 mg via ORAL
  Filled 2021-07-13 (×2): qty 2

## 2021-07-13 MED ORDER — FLUTICASONE PROPIONATE 50 MCG/ACT NA SUSP: 2.0000 | Freq: Every day | NASAL | Status: DC | PRN

## 2021-07-13 MED ORDER — ACETAMINOPHEN 650 MG RE SUPP: 650.0000 mg | Freq: Four times a day (QID) | RECTAL | Status: DC | PRN

## 2021-07-13 MED ORDER — ACETAMINOPHEN 325 MG PO TABS
650.0000 mg | ORAL_TABLET | Freq: Four times a day (QID) | ORAL | Status: DC | PRN
  Administered 2021-07-14: 650 mg via ORAL
  Filled 2021-07-13: qty 2

## 2021-07-13 MED ORDER — IPRATROPIUM-ALBUTEROL 0.5-2.5 (3) MG/3ML IN SOLN: 3.0000 mL | RESPIRATORY_TRACT | Status: DC | PRN

## 2021-07-13 MED ORDER — GABAPENTIN 300 MG PO CAPS
300.0000 mg | ORAL_CAPSULE | Freq: Three times a day (TID) | ORAL | Status: DC
  Administered 2021-07-13 – 2021-07-15 (×5): 300 mg via ORAL
  Filled 2021-07-13 (×5): qty 1

## 2021-07-13 MED ORDER — AMPHETAMINE-DEXTROAMPHETAMINE 10 MG PO TABS
20.0000 mg | ORAL_TABLET | Freq: Three times a day (TID) | ORAL | Status: DC
  Administered 2021-07-14 – 2021-07-15 (×3): 20 mg via ORAL
  Filled 2021-07-13 (×3): qty 2

## 2021-07-13 MED ORDER — ENSURE ENLIVE PO LIQD
237.0000 mL | Freq: Two times a day (BID) | ORAL | Status: DC
  Administered 2021-07-14: 237 mL via ORAL

## 2021-07-13 MED ORDER — ONDANSETRON HCL 4 MG/2ML IJ SOLN: 4.0000 mg | Freq: Four times a day (QID) | INTRAMUSCULAR | Status: DC | PRN

## 2021-07-13 MED ORDER — BUPRENORPHINE HCL-NALOXONE HCL 8-2 MG SL SUBL
1.0000 | SUBLINGUAL_TABLET | Freq: Three times a day (TID) | SUBLINGUAL | Status: DC
  Administered 2021-07-13 – 2021-07-15 (×4): 1 via SUBLINGUAL
  Filled 2021-07-13 (×5): qty 1

## 2021-07-13 MED ORDER — SODIUM CHLORIDE 0.9 % IV SOLN
3.0000 g | Freq: Four times a day (QID) | INTRAVENOUS | Status: DC
  Administered 2021-07-13: 3 g via INTRAVENOUS
  Filled 2021-07-13 (×4): qty 8

## 2021-07-13 MED ORDER — SODIUM CHLORIDE 0.9 % IV SOLN
2.0000 g | INTRAVENOUS | Status: DC
  Administered 2021-07-13 – 2021-07-14 (×2): 2 g via INTRAVENOUS
  Filled 2021-07-13 (×2): qty 20

## 2021-07-13 MED ORDER — POLYETHYLENE GLYCOL 3350 17 G PO PACK
17.0000 g | PACK | Freq: Every day | ORAL | Status: DC | PRN
  Administered 2021-07-14: 17 g via ORAL
  Filled 2021-07-13: qty 1

## 2021-07-13 MED ORDER — POTASSIUM CHLORIDE IN NACL 20-0.9 MEQ/L-% IV SOLN: INTRAVENOUS | Status: AC

## 2021-07-13 NOTE — Assessment & Plan Note (Signed)
Stable.  No wheezing on exam.  ?-DuoNebs as needed ?

## 2021-07-13 NOTE — ED Notes (Signed)
Patient transported to CT 

## 2021-07-13 NOTE — H&P (Signed)
?History and Physical  ? ? ?[name] BHA:[id number] DOB: Aug 22, 1960 DOA: 07/13/2021 ? ?PCP: Ponciano Ort, [facility]  ? ?Patient coming from: Home ? ?I have personally briefly reviewed patient's old medical records in [facility] ? ?Chief Complaint: Cough ? ?HPI: [name] is a 61 y.o. male with medical history significant for COPD, pulmonary embolism, necrotizing pneumonia, protein S deficiency. ?Patient presented to the ED with complaints of coughing of 4 to 6 weeks.  Reports initially he was having streaks of blood in his sputum, but today he saw blood clots.  Reports associated difficulty breathing.  Reports a fever about a week ago, he does not remember how high it was.  Patient has had pneumonia several times in the past, and he felt he had a pneumonia so he came to the ED. ?Reports history of aspiration, diagnosed when he had a swallow evaluation in the past.  He is otherwise unaware if or that he is actually aspirating.   ?No chest pain.  No leg swelling.  Last hospitalization for pneumonia 01/2021- Left upper lobe masslike consolidation- pneumonia versus malignancy. Treated with Vanco and cefepime after failing outpatient treatment with doxycycline, he has since followed up with pulmonologist-Dr. Delton Coombes. ? ?ED Course: Patient 98.5.  Heart rate 70s to 97.  Respiratory rate 18-23.  O2 sats 90- 100% on room air, with ambulation O2 sats dropped to 91 to 92% on room air.  Was placed on 2 L.  COVID and influenza test negative. ?WBC 7.9.  CTA chest shows right base mass suspicious for pneumonia.  IV Unasyn started for possible aspiration pneumonia. ?Hospitalist to admit for pneumonia with borderline hypoxia and hemoptysis.  ? ?Review of Systems: As per HPI all other systems reviewed and negative. ? ?Past Medical History:  ?Diagnosis Date  ? Anxiety   ? Arthritis   ? Asthma   ? Bipolar disorder (HCC)   ? Chronic pain   ? COPD (chronic obstructive pulmonary disease) (HCC)   ? Degenerative disk disease   ?  GERD (gastroesophageal reflux disease)   ? Hiatal hernia   ? History of pneumonia   ? PE (pulmonary embolism) 2012  ? Protein S deficiency (HCC)   ? ? ?Past Surgical History:  ?Procedure Laterality Date  ? CHOLECYSTECTOMY    ? CLOSED REDUCTION MANDIBLE WITH MANDIBULOMA  10/21/2011  ? Procedure: CLOSED REDUCTION MANDIBLE WITH MANDIBULOMAXILLARY FUSION;  Surgeon: Darletta Moll, MD;  Location: [facility] OR;  Service: ENT;  Laterality: N/A;  MMF screws  ? HAND SURGERY Right 20 years ago  ? INCISIONAL HERNIA REPAIR N/A 10/27/2018  ? Procedure: HERNIA REPAIR INCISIONAL WITH MESH;  Surgeon: Franky Macho, MD;  Location: [facility] ORS;  Service: General;  Laterality: N/A;  ? INCISIONAL HERNIA REPAIR N/A 02/23/2019  ? Procedure: LAPAROSCOPIC INCISIONAL HERNIA REPAIR WITH MESH;  Surgeon: Franky Macho, MD;  Location: [facility] ORS;  Service: General;  Laterality: N/A;  ? LEG SURGERY    ? MANDIBULAR HARDWARE REMOVAL  11/11/2011  ? Procedure: MANDIBULAR HARDWARE REMOVAL;  Surgeon: Darletta Moll, MD;  Location: [facility];  Service: ENT;  Laterality: N/A;  ? ORIF MANDIBULAR FRACTURE  10/21/2011  ? Procedure: OPEN REDUCTION INTERNAL FIXATION (ORIF) MANDIBULAR FRACTURE;  Surgeon: Darletta Moll, MD;  Location: [facility] OR;  Service: ENT;  Laterality: N/A;  ? UMBILICAL HERNIA REPAIR    ? ? ? reports that he has been smoking cigarettes. He has a 8.75 pack-year smoking history. He has never used smokeless  tobacco. He reports current drug use. He reports that he does not drink alcohol. ? ?No Known Allergies ? ?Family History  ?Problem Relation Age of Onset  ? Coronary artery disease Mother   ? Coronary artery disease Father   ? Diabetes Other   ? ? ?Prior to Admission medications   ?Medication Sig Start Date End Date Taking? Authorizing Provider  ?albuterol (PROVENTIL HFA;VENTOLIN HFA) 108 (90 BASE) MCG/ACT inhaler Inhale 2 puffs into the lungs every 4 (four) hours as needed for wheezing or shortness of breath.    Yes [provider]  ?ALPRAZolam  (XANAX) 0.5 MG tablet Take 0.5 mg by mouth 3 (three) times daily. 05/01/21  Yes [provider]  ?amphetamine-dextroamphetamine (ADDERALL) 20 MG tablet Take 20 mg by mouth 3 (three) times daily. 01/17/19  Yes [provider]  ?atorvastatin (LIPITOR) 10 MG tablet Take 1 tablet by mouth daily. 06/11/21  Yes [provider]  ?clonazePAM (KLONOPIN) 1 MG tablet Take 1 mg by mouth 2 (two) times daily. 02/06/21  Yes [provider]  ?fluticasone (FLONASE) 50 MCG/ACT nasal spray Place 2 sprays into both nostrils daily as needed for congestion. 05/20/21  Yes [provider]  ?gabapentin (NEURONTIN) 300 MG capsule Take 1 capsule (300 mg total) by mouth 3 (three) times daily. 10/27/18  Yes Franky Macho, MD  ?meclizine (ANTIVERT) 12.5 MG tablet Take 25 mg by mouth 3 (three) times daily as needed for dizziness.   Yes [provider]  ?Milk Thistle 1000 MG CAPS Take 1,000 mg by mouth daily.    Yes [provider]  ?omeprazole (PRILOSEC) 40 MG capsule Take 40 mg by mouth daily.   Yes [provider]  ?ondansetron (ZOFRAN) 4 MG tablet Take 4 mg by mouth every 6 (six) hours as needed for dizziness or nausea. 07/02/21  Yes [provider]  ?rivaroxaban (XARELTO) 20 MG TABS tablet Take 20 mg by mouth daily.   Yes [provider]  ?SUBOXONE 8-2 MG FILM Take 1 Film by mouth in the morning, at noon, and at bedtime.  02/14/19  Yes [provider]  ?triamcinolone cream (KENALOG) 0.5 % Apply 1 application. topically 2 (two) times daily as needed (poison oak/poison ivy). 10/11/19  Yes [provider]  ?ferrous sulfate 324 MG TBEC Take 1 tablet (324 mg total) by mouth daily with breakfast. ?Patient not taking: Reported on 02/19/2021 01/24/21 02/23/21  Kendell Bane, MD  ?fluticasone furoate-vilanterol (BREO ELLIPTA) 100-25 MCG/ACT AEPB Inhale 1 puff into the lungs daily. ?Patient not taking: Reported on 05/07/2021 10/23/20   [provider]  ?fluticasone-salmeterol (ADVAIR HFA) 45-21 MCG/ACT inhaler Inhale 2 puffs into the lungs 2 (two) times daily. ?Patient not taking: Reported on 07/13/2021 01/24/21   Kendell Bane, MD  ?guaiFENesin-dextromethorphan [facility] DM) 100-10 MG/5ML syrup Take 5 mLs by mouth every 4 (four) hours as needed for cough. ?Patient not taking: Reported on 07/13/2021 10/19/19   Cleora Fleet, MD  ?Tiotropium Bromide-Olodaterol (STIOLTO RESPIMAT) 2.5-2.5 MCG/ACT AERS Inhale 2 puffs into the lungs daily. ?Patient not taking: Reported on 07/13/2021 05/07/21   Leslye Peer, MD  ? ? ?Physical Exam: ?Vitals:  ? 07/13/21 1515 07/13/21 1610 07/13/21 1630 07/13/21 1800  ?BP:  115/73 112/66 137/81  ?Pulse: 74 75 70 97  ?Resp: 18 20 (!) 23 (!) 21  ?Temp:    98.1 ?F (36.7 ?C)  ?TempSrc:    Oral  ?SpO2: 98% 96% 100% 100%  ?Weight:    88 kg  ?  Height:    6\' 3"  (1.905 m)  ? ? ?Constitutional: NAD, calm, comfortable ?Vitals:  ? 07/13/21 1515 07/13/21 1610 07/13/21 1630 07/13/21 1800  ?BP:  115/73 112/66 137/81  ?Pulse: 74 75 70 97  ?Resp: 18 20 (!) 23 (!) 21  ?Temp:    98.1 ?F (36.7 ?C)  ?TempSrc:    Oral  ?SpO2: 98% 96% 100% 100%  ?Weight:    88 kg  ?Height:    6\' 3"  (1.905 m)  ? ?Eyes: PERRL, lids and conjunctivae normal ?ENMT: Mucous membranes are moist.  ?Neck: normal, supple, no masses, no thyromegaly ?Respiratory: Coughing, crackles right lung base, no wheezing, Normal respiratory effort. No accessory muscle use.  ?Cardiovascular: Regular rate and rhythm, no murmurs / rubs / gallops. No extremity edema. 2+ pedal pulses.  ?Abdomen: no tenderness, no masses palpated. No hepatosplenomegaly. Bowel sounds positive.  ?Musculoskeletal: no clubbing / cyanosis. No joint deformity upper and lower extremities.  ?Skin: no rashes, lesions, ulcers. No induration ?Neurologic: No apparent cranial nerve abnormality moving extremities spontaneously.  ?Psychiatric: Normal judgment and insight. Alert and oriented x 3. Normal mood.  ? ?Labs  on Admission: I have personally reviewed following labs and imaging studies ? ?CBC: ?Recent Labs  ?Lab 07/13/21 ?1432  ?WBC 7.9  ?NEUTROABS 5.6  ?HGB 11.5*  ?HCT 35.4*  ?MCV 88.7  ?PLT 260  ? ?Basic Metabolic Pan

## 2021-07-13 NOTE — Assessment & Plan Note (Signed)
Hold home Xarelto with hemoptysis, reports sputum with blood clots today. ?

## 2021-07-13 NOTE — Progress Notes (Signed)
Pharmacy Antibiotic Note ? ?[name] is a 61 y.o. male admitted on 07/13/2021 with aspiration pneumonia.  Pharmacy has been consulted for Unasyn dosing. ? ?Plan: ?Unasyn 3gm IV q6h ?F/U cxs and clinical progress ?Monitor V/S and labs ? ?Height: 6\' 3"  (190.5 cm) ?Weight: 88.1 kg (194 lb 3 oz) ?IBW/kg (Calculated) : 84.5 ? ?Temp (24hrs), Avg:98.5 ?F (36.9 ?C), Min:98.5 ?F (36.9 ?C), Max:98.5 ?F (36.9 ?C) ? ?Recent Labs  ?Lab 07/13/21 ?1432  ?WBC 7.9  ?CREATININE 0.74  ?  ?Estimated Creatinine Clearance: 118.8 mL/min (by C-G formula based on SCr of 0.74 mg/dL).   ? ?No Known Allergies ? ?Antimicrobials this admission: ?unasyn 4/1 >>  ? ?Microbiology results: ?4/1 BCx: pending ? ?Thank you for allowing pharmacy to be a part of this patient?s care. ? ?6/1, BS Pharm D, BCPS ?Clinical Pharmacist ?07/13/2021 5:19 PM ? ?

## 2021-07-13 NOTE — ED Triage Notes (Signed)
Pt with SOB, concerned for PNA.  Pt states hx of PNA off and on for several years. Productive cough of blood.  Coughing about a month ago.  ?

## 2021-07-13 NOTE — ED Notes (Signed)
Admitting at bedside 

## 2021-07-13 NOTE — Assessment & Plan Note (Signed)
-   Clonazepam 1 mg BID, home Suboxone and Adderall ?

## 2021-07-13 NOTE — ED Notes (Signed)
Patient ambulated in hall on room air. Patient's O2 sat dropped to 91-92%. Returned to 95% once back in bed. Patient's HR 118 with ambulation. Patient does report feeling light headed. Gait steady. EDP made aware.  ?

## 2021-07-13 NOTE — Assessment & Plan Note (Addendum)
Hemoptysis, dyspnea..  O2 sats 90 to 100% on room air and with ambulation.  Rules out for sepsis, afebrile, WBC 7.9.  Chest shows new masslike opacity in the right base, likely pneumonia, with likely associated reactive hilar and mediastinal adenopathy.  Denies obvious aspiration, but per speech therapy evaluation 2021, patient had mild to moderate pharyngeal phase dysphagia, aspiration not observed over the course of study, but patient at risk.  Prior pneumonia lung locations have not been quite consistent with aspiration. ?-Urine strep and Legionella antigen  ?-Swallow evaluation ?-IV ceftriaxone and azithromycin for now ?- N/s + 20 KCl 100cc/hr x 15hrs ?-Hold home Xarelto ?

## 2021-07-13 NOTE — ED Notes (Signed)
Patient transported to X-ray 

## 2021-07-13 NOTE — ED Notes (Signed)
Patient requesting to not ambulate with pulse ox at this time. Patient states he is very tired and would like to rest.  ?

## 2021-07-13 NOTE — ED Provider Notes (Signed)
?[facility] ?Provider Note ? ? ?CSN: [id number] ?Arrival date & time: 07/13/21  1419 ? ?  ? ?History ? ?Chief Complaint  ?Patient presents with  ? Shortness of Breath  ? ? ?[name] is a 61 y.o. male with a past medical history of COPD, PE with protein S deficiency, necrotizing pneumonia, aspiration, tobacco abuse, who presents today for evaluation of shortness of breath and hemoptysis. ?He states that over the past 6 months he has had increasing frequency and volume of hemoptysis.  He states that he has been "ignoring" his symptoms.  He reports his shortness of breath is worsened during this time.  He decided to seek care today as he had a increased volume of hemoptysis with clots.  He states a week ago he had fevers, none in the past week.  He denies any vomiting. ? ?HPI ? ?  ? ?Home Medications ?Prior to Admission medications   ?Medication Sig Start Date End Date Taking? Authorizing Provider  ?albuterol (PROVENTIL HFA;VENTOLIN HFA) 108 (90 BASE) MCG/ACT inhaler Inhale 2 puffs into the lungs every 4 (four) hours as needed for wheezing or shortness of breath.    Yes [provider]  ?ALPRAZolam (XANAX) 0.5 MG tablet Take 0.5 mg by mouth 3 (three) times daily. 05/01/21  Yes [provider]  ?amphetamine-dextroamphetamine (ADDERALL) 20 MG tablet Take 20 mg by mouth 3 (three) times daily. 01/17/19  Yes [provider]  ?atorvastatin (LIPITOR) 10 MG tablet Take 1 tablet by mouth daily. 06/11/21  Yes [provider]  ?clonazePAM (KLONOPIN) 1 MG tablet Take 1 mg by mouth 2 (two) times daily. 02/06/21  Yes [provider]  ?fluticasone (FLONASE) 50 MCG/ACT nasal spray Place 2 sprays into both nostrils daily as needed for congestion. 05/20/21  Yes [provider]  ?gabapentin (NEURONTIN) 300 MG capsule Take 1 capsule (300 mg total) by mouth 3 (three) times daily. 10/27/18  Yes Franky Macho, MD  ?meclizine (ANTIVERT) 12.5 MG tablet Take 25 mg by mouth  3 (three) times daily as needed for dizziness.   Yes [provider]  ?Milk Thistle 1000 MG CAPS Take 1,000 mg by mouth daily.    Yes [provider]  ?omeprazole (PRILOSEC) 40 MG capsule Take 40 mg by mouth daily.   Yes [provider]  ?ondansetron (ZOFRAN) 4 MG tablet Take 4 mg by mouth every 6 (six) hours as needed for dizziness or nausea. 07/02/21  Yes [provider]  ?rivaroxaban (XARELTO) 20 MG TABS tablet Take 20 mg by mouth daily.   Yes [provider]  ?SUBOXONE 8-2 MG FILM Take 1 Film by mouth in the morning, at noon, and at bedtime.  02/14/19  Yes [provider]  ?triamcinolone cream (KENALOG) 0.5 % Apply 1 application. topically 2 (two) times daily as needed (poison oak/poison ivy). 10/11/19  Yes [provider]  ?ferrous sulfate 324 MG TBEC Take 1 tablet (324 mg total) by mouth daily with breakfast. ?Patient not taking: Reported on 02/19/2021 01/24/21 02/23/21  Kendell Bane, MD  ?fluticasone furoate-vilanterol (BREO ELLIPTA) 100-25 MCG/ACT AEPB Inhale 1 puff into the lungs daily. ?Patient not taking: Reported on 05/07/2021 10/23/20   [provider]  ?fluticasone-salmeterol (ADVAIR HFA) 45-21 MCG/ACT inhaler Inhale 2 puffs into the lungs 2 (two) times daily. ?Patient not taking: Reported on 07/13/2021 01/24/21   Kendell Bane, MD  ?guaiFENesin-dextromethorphan [facility] DM) 100-10 MG/5ML syrup Take 5 mLs by mouth every 4 (four) hours as needed for  cough. ?Patient not taking: Reported on 07/13/2021 10/19/19   Cleora FleetJohnson, Clanford L, MD  ?Tiotropium Bromide-Olodaterol (STIOLTO RESPIMAT) 2.5-2.5 MCG/ACT AERS Inhale 2 puffs into the lungs daily. ?Patient not taking: Reported on 07/13/2021 05/07/21   Leslye PeerByrum, Robert S, MD  ?   ? ?Allergies    ?Patient has no known allergies.   ? ?Review of Systems   ?Review of Systems ? ?Physical Exam ?Updated Vital Signs ?BP 122/75   Pulse 91   Temp 98.7 ?F (37.1 ?C)   Resp 20   Ht 6\' 3"  (1.905 m)   Wt  88 kg   SpO2 99%   BMI 24.25 kg/m?  ?Physical Exam ?Vitals and nursing note reviewed.  ?Constitutional:   ?   General: He is not in acute distress. ?   Appearance: He is not ill-appearing.  ?HENT:  ?   Head: Atraumatic.  ?Eyes:  ?   Conjunctiva/sclera: Conjunctivae normal.  ?Cardiovascular:  ?   Rate and Rhythm: Normal rate.  ?Pulmonary:  ?   Effort: Accessory muscle usage and respiratory distress (Mild) present.  ?   Breath sounds: No decreased breath sounds.  ?Abdominal:  ?   General: There is no distension.  ?Musculoskeletal:  ?   Cervical back: Normal range of motion and neck supple.  ?   Comments: No obvious acute injury  ?Skin: ?   General: Skin is warm.  ?Neurological:  ?   Mental Status: He is alert.  ?   Comments: Awake and alert, answers all questions appropriately.  Speech is not slurred.  ?Psychiatric:     ?   Mood and Affect: Mood normal.     ?   Behavior: Behavior normal.  ? ? ?ED Results / Procedures / Treatments   ?Labs ?(all labs ordered are listed, but only abnormal results are displayed) ?Labs Reviewed  ?COMPREHENSIVE METABOLIC PANEL - Abnormal; Notable for the following components:  ?    Result Value  ? Glucose, Bld 126 (*)   ? Calcium 8.8 (*)   ? Albumin 3.3 (*)   ? AST 11 (*)   ? All other components within normal limits  ?CBC WITH DIFFERENTIAL/PLATELET - Abnormal; Notable for the following components:  ? RBC 3.99 (*)   ? Hemoglobin 11.5 (*)   ? HCT 35.4 (*)   ? All other components within normal limits  ?CULTURE, BLOOD (ROUTINE X 2)  ?CULTURE, BLOOD (ROUTINE X 2)  ?RESP PANEL BY RT-PCR (FLU A&B, COVID) ARPGX2  ?LEGIONELLA PNEUMOPHILA SEROGP 1 UR AG  ?STREP PNEUMONIAE URINARY ANTIGEN  ?BASIC METABOLIC PANEL  ?CBC  ?TROPONIN I (HIGH SENSITIVITY)  ?TROPONIN I (HIGH SENSITIVITY)  ? ? ?EKG ?EKG Interpretation ? ?Date/Time:  Saturday July 13 2021 14:47:34 EDT ?Ventricular Rate:  76 ?PR Interval:  148 ?QRS Duration: 103 ?QT Interval:  389 ?QTC Calculation: 438 ?R Axis:   77 ?Text  Interpretation: Sinus rhythm Baseline wander in lead(s) V3 similar to prior no stemi Confirmed by Tanda RockersGray, Samuel ([id number]) on 07/13/2021 3:33:13 PM ? ?Radiology ?DG Chest 2 View ? ?Result Date: 07/13/2021 ?CLINICAL DATA:  Shortness of breath, coughing blood. EXAM: CHEST - 2 VIEW COMPARISON:  Chest x-ray 01/22/2021. FINDINGS: There is a new right infrahilar masslike density measuring 3.4 x 3.2 cm. The lungs are otherwise clear. There is no pleural effusion or pneumothorax. Cardiomediastinal silhouette is otherwise within normal limits. No acute fractures are seen. IMPRESSION: 1. New right infrahilar masslike density may represent focal pneumonia, neoplasm or adenopathy. Recommend further evaluation with chest  CT. Electronically Signed   By: Darliss Cheney M.D.   On: 07/13/2021 15:24  ? ?CT Angio Chest PE W and/or Wo Contrast ? ?Result Date: 07/13/2021 ?CLINICAL DATA:  Suspected pulmonary embolus. Cavitary pneumonia. Hemoptysis for 6 weeks with worsening shortness of breath. EXAM: CT ANGIOGRAPHY CHEST WITH CONTRAST TECHNIQUE: Multidetector CT imaging of the chest was performed using the standard protocol during bolus administration of intravenous contrast. Multiplanar CT image reconstructions and MIPs were obtained to evaluate the vascular anatomy. RADIATION DOSE REDUCTION: This exam was performed according to the departmental dose-optimization program which includes automated exposure control, adjustment of the mA and/or kV according to patient size and/or use of iterative reconstruction technique. CONTRAST:  15mL OMNIPAQUE IOHEXOL 350 MG/ML SOLN COMPARISON:  March 15, 2021 FINDINGS: Cardiovascular: The thoracic aorta, heart, and coronary arteries are unchanged unremarkable. No pulmonary emboli. Mediastinum/Nodes: The thyroid and esophagus are normal. No pleural or pericardial effusions. There is adenopathy in the right hilum. A representative node on series 5, image 57 measures 2.1 cm. There is adenopathy in the mediastinum,  worsened in the interval. A representative right paratracheal node measures 1.8 x 1.4 cm today versus is 1.5 by 1.4 cm on March 15, 2021. Other shotty and mildly enlarged nodes in the mediastinum, particularly the right si

## 2021-07-13 NOTE — ED Notes (Signed)
Patient placed on 2L [place] due to oxygen saturation 90% while resting in bed.  ?

## 2021-07-14 DIAGNOSIS — J189 Pneumonia, unspecified organism: Secondary | ICD-10-CM | POA: Diagnosis not present

## 2021-07-14 LAB — BASIC METABOLIC PANEL
Anion gap: 9 (ref 5–15)
BUN: 15 mg/dL (ref 6–20)
CO2: 25 mmol/L (ref 22–32)
Calcium: 8.7 mg/dL — ABNORMAL LOW (ref 8.9–10.3)
Chloride: 100 mmol/L (ref 98–111)
Creatinine, Ser: 0.61 mg/dL (ref 0.61–1.24)
GFR, Estimated: >60 mL/min (ref >60)
Glucose, Bld: 107 mg/dL — ABNORMAL HIGH (ref 70–99)
Potassium: 4 mmol/L (ref 3.5–5.1)
Sodium: 134 mmol/L — ABNORMAL LOW (ref 135–145)

## 2021-07-14 LAB — CBC WITH DIFFERENTIAL/PLATELET
Abs Immature Granulocytes: 0.04 10*3/uL (ref 0.00–0.07)
Basophils Absolute: 0.1 10*3/uL (ref 0.0–0.1)
Basophils Relative: 1 %
Eosinophils Absolute: 0.2 10*3/uL (ref 0.0–0.5)
Eosinophils Relative: 2 %
HCT: 35.8 % — ABNORMAL LOW (ref 39.0–52.0)
Hemoglobin: 11.4 g/dL — ABNORMAL LOW (ref 13.0–17.0)
Immature Granulocytes: 1 %
Lymphocytes Relative: 18 %
Lymphs Abs: 1.6 10*3/uL (ref 0.7–4.0)
MCH: 29.1 pg (ref 26.0–34.0)
MCHC: 31.8 g/dL (ref 30.0–36.0)
MCV: 91.3 fL (ref 80.0–100.0)
Monocytes Absolute: 0.8 10*3/uL (ref 0.1–1.0)
Monocytes Relative: 9 %
Neutro Abs: 6.1 10*3/uL (ref 1.7–7.7)
Neutrophils Relative %: 69 %
Platelets: 264 10*3/uL (ref 150–400)
RBC: 3.92 MIL/uL — ABNORMAL LOW (ref 4.22–5.81)
RDW: 13.1 % (ref 11.5–15.5)
WBC: 8.8 10*3/uL (ref 4.0–10.5)
nRBC: 0 % (ref 0.0–0.2)

## 2021-07-14 LAB — CBC
HCT: 37.1 % — ABNORMAL LOW (ref 39.0–52.0)
Hemoglobin: 11.8 g/dL — ABNORMAL LOW (ref 13.0–17.0)
MCH: 28.7 pg (ref 26.0–34.0)
MCHC: 31.8 g/dL (ref 30.0–36.0)
MCV: 90.3 fL (ref 80.0–100.0)
Platelets: 302 10*3/uL (ref 150–400)
RBC: 4.11 MIL/uL — ABNORMAL LOW (ref 4.22–5.81)
RDW: 13 % (ref 11.5–15.5)
WBC: 11.1 10*3/uL — ABNORMAL HIGH (ref 4.0–10.5)
nRBC: 0 % (ref 0.0–0.2)

## 2021-07-14 MED ORDER — ALPRAZOLAM 0.5 MG PO TABS
0.5000 mg | ORAL_TABLET | Freq: Three times a day (TID) | ORAL | Status: DC
  Administered 2021-07-14 – 2021-07-15 (×3): 0.5 mg via ORAL
  Filled 2021-07-14 (×3): qty 1

## 2021-07-14 MED ORDER — RIVAROXABAN 20 MG PO TABS: 20.0000 mg | ORAL_TABLET | Freq: Every day | ORAL | Status: DC

## 2021-07-14 MED ORDER — MECLIZINE HCL 12.5 MG PO TABS: 25.0000 mg | ORAL_TABLET | Freq: Three times a day (TID) | ORAL | Status: DC | PRN

## 2021-07-14 MED ORDER — METHYLPREDNISOLONE SODIUM SUCC 125 MG IJ SOLR
80.0000 mg | Freq: Every day | INTRAMUSCULAR | Status: DC
  Administered 2021-07-14 – 2021-07-15 (×2): 80 mg via INTRAVENOUS
  Filled 2021-07-14 (×2): qty 2

## 2021-07-14 MED ORDER — IPRATROPIUM-ALBUTEROL 0.5-2.5 (3) MG/3ML IN SOLN
3.0000 mL | Freq: Four times a day (QID) | RESPIRATORY_TRACT | Status: DC
  Administered 2021-07-14 – 2021-07-15 (×3): 3 mL via RESPIRATORY_TRACT
  Filled 2021-07-14 (×4): qty 3

## 2021-07-14 NOTE — Progress Notes (Signed)
?PROGRESS NOTE ? ? ? [name]  [id number] DOB: 14-Oct-1960 DOA: 07/13/2021 ?PCP: [facility] ? ? ?Brief Narrative:  ?HPI: [name] is a 61 y.o. male with medical history significant for COPD, pulmonary embolism, necrotizing pneumonia, protein S deficiency. ?Patient presented to the ED with complaints of coughing of 4 to 6 weeks.  Reports initially he was having streaks of blood in his sputum, but today he saw blood clots.  Reports associated difficulty breathing.  Reports a fever about a week ago, he does not remember how high it was.  Patient has had pneumonia several times in the past, and he felt he had a pneumonia so he came to the ED. ?Reports history of aspiration, diagnosed when he had a swallow evaluation in the past.  He is otherwise unaware if or that he is actually aspirating.   ?No chest pain.  No leg swelling.  Last hospitalization for pneumonia 01/2021- Left upper lobe masslike consolidation- pneumonia versus malignancy. Treated with Vanco and cefepime after failing outpatient treatment with doxycycline, he has since followed up with pulmonologist-Dr. Lamonte Sakai. ?  ?ED Course: Patient 98.5.  Heart rate 70s to 97.  Respiratory rate 18-23.  O2 sats 90- 100% on room air, with ambulation O2 sats dropped to 91 to 92% on room air.  Was placed on 2 L.  COVID and influenza test negative. ?WBC 7.9.  CTA chest shows right base mass suspicious for pneumonia.  IV Unasyn started for possible aspiration pneumonia. ?Hospitalist to admit for pneumonia with borderline hypoxia and hemoptysis.  ? ?Assessment & Plan: ?  ?Principal Problem: ?  PNA (pneumonia) ?Active Problems: ?  Hx of pulmonary embolus ?  COPD with acute exacerbation ([place]) ?  Polysubstance dependence including opioid type drug, episodic abuse ([place]) ? ?Aspiration pneumonia/acute COPD exacerbation: CT chest with contrast shows masslike opacity in the right base but radiologist favors pneumonia over malignancy due to short duration of the  development.  Recommends follow-up as outpatient with repeat scan in 2 to 3 months after treating pneumonia.  Patient has been started on Rocephin and Zithromax which I will continue however patient also has wheezes bilaterally and he smokes and has COPD and I think he has COPD exacerbation so I will start him on Solu-Medrol as well as DuoNeb.  Per H&P, his Xarelto was supposed to be held due to hemoptysis but he still has Xarelto on the [facility] so I will discontinue that.  Patient did not complain of any hemoptysis this morning. ? ?History of PE: No PE on the CT this time.  Xarelto on hold due to hemoptysis. ? ?Polysubstance dependence including opioid type drug, episodic abuse (HCC)/anxiety. ?- Clonazepam 1 mg BID, home Suboxone and Adderall ?  ?Hyperlipidemia: Continue atorvastatin. ? ?GERD: Continue PPI. ? ?DVT prophylaxis: SCDs Start: 07/13/21 1958 ?  Code Status: Full Code  ?Family Communication:  None present at bedside.  Plan of care discussed with patient in length and he/she verbalized understanding and agreed with it. ? ?Status is: Inpatient ?Remains inpatient appropriate because: Needs some more management. ? ? ?Estimated body mass index is 24.25 kg/m? as calculated from the following: ?  Height as of this encounter: 6\' 3"  (1.905 m). ?  Weight as of this encounter: 88 kg. ? ?  ?Nutritional Assessment: ?Body mass index is 24.25 kg/m?Marland KitchenMarland Kitchen ?Seen by dietician.  I agree with the assessment and plan as outlined below: ?Nutrition Status: ?  ?  ?  ? ?. ?Skin Assessment: ?I  have examined the patient's skin and I agree with the wound assessment as performed by the wound care RN as outlined below: ?  ? ?Consultants:  ?None ? ?Procedures:  ?None ? ?Antimicrobials:  ?Anti-infectives (From admission, onward)  ? ? Start     Dose/Rate Route Frequency Ordered Stop  ? 07/13/21 2200  azithromycin (ZITHROMAX) 500 mg in sodium chloride 0.9 % 250 mL IVPB       ? 500 mg ?250 mL/hr over 60 Minutes Intravenous Every 24 hours 07/13/21  2029    ? 07/13/21 2100  cefTRIAXone (ROCEPHIN) 2 g in sodium chloride 0.9 % 100 mL IVPB       ? 2 g ?200 mL/hr over 30 Minutes Intravenous Every 24 hours 07/13/21 2029    ? 07/13/21 1800  Ampicillin-Sulbactam (UNASYN) 3 g in sodium chloride 0.9 % 100 mL IVPB  Status:  Discontinued       ? 3 g ?200 mL/hr over 30 Minutes Intravenous Every 6 hours 07/13/21 1719 07/13/21 2029  ? ?  ?  ? ? ?Subjective: ?Patient seen and examined.  He still complains of shortness of breath but no hemoptysis.  Has cough. ? ?Objective: ?Vitals:  ? 07/13/21 1800 07/13/21 2147 07/14/21 0220 07/14/21 0440  ?BP: 137/81 122/75 121/71 110/65  ?Pulse: 97 91 85 86  ?Resp: (!) 21 20 18 19   ?Temp: 98.1 ?F (36.7 ?C) 98.7 ?F (37.1 ?C) 98.5 ?F (36.9 ?C) 98.5 ?F (36.9 ?C)  ?TempSrc: Oral  Oral   ?SpO2: 100% 99% 98% 98%  ?Weight: 88 kg     ?Height: 6\' 3"  (1.905 m)     ? ? ?Intake/Output Summary (Last 24 hours) at 07/14/2021 1307 ?Last data filed at 07/14/2021 0900 ?Gross per 24 hour  ?Intake 1879.5 ml  ?Output --  ?Net 1879.5 ml  ? ?Filed Weights  ? 07/13/21 1423 07/13/21 1800  ?Weight: 88.1 kg 88 kg  ? ? ?Examination: ? ?General exam: Appears calm and comfortable  ?Respiratory system: Bilateral rhonchi with expiratory wheezes. Respiratory effort normal. ?Cardiovascular system: S1 & S2 heard, RRR. No JVD, murmurs, rubs, gallops or clicks. No pedal edema. ?Gastrointestinal system: Abdomen is nondistended, soft and nontender. No organomegaly or masses felt. Normal bowel sounds heard. ?Central nervous system: Alert and oriented. No focal neurological deficits. ?Extremities: Symmetric 5 x 5 power. ?Skin: No rashes, lesions or ulcers ?Psychiatry: Judgement and insight appear normal. Mood & affect appropriate.  ? ? ?Data Reviewed: I have personally reviewed following labs and imaging studies ? ?CBC: ?Recent Labs  ?Lab 07/13/21 ?1432 07/14/21 ?0432  ?WBC 7.9 11.1*  ?NEUTROABS 5.6  --   ?HGB 11.5* 11.8*  ?HCT 35.4* 37.1*  ?MCV 88.7 90.3  ?PLT 260 302  ? ?Basic  Metabolic Panel: ?Recent Labs  ?Lab 07/13/21 ?1432 07/14/21 ?0432  ?NA 136 134*  ?K 3.7 4.0  ?CL 103 100  ?CO2 25 25  ?GLUCOSE 126* 107*  ?BUN 20 15  ?CREATININE 0.74 0.61  ?CALCIUM 8.8* 8.7*  ? ?GFR: ?Estimated Creatinine Clearance: 118.8 mL/min (by C-G formula based on SCr of 0.61 mg/dL). ?Liver Function Tests: ?Recent Labs  ?Lab 07/13/21 ?1432  ?AST 11*  ?ALT 13  ?ALKPHOS 67  ?BILITOT 0.3  ?PROT 7.5  ?ALBUMIN 3.3*  ? ?No results for input(s): LIPASE, AMYLASE in the last 168 hours. ?No results for input(s): AMMONIA in the last 168 hours. ?Coagulation Profile: ?No results for input(s): INR, PROTIME in the last 168 hours. ?Cardiac Enzymes: ?No results for input(s): CKTOTAL, CKMB, CKMBINDEX,  TROPONINI in the last 168 hours. ?BNP (last 3 results) ?No results for input(s): PROBNP in the last 8760 hours. ?HbA1C: ?No results for input(s): HGBA1C in the last 72 hours. ?CBG: ?No results for input(s): GLUCAP in the last 168 hours. ?Lipid Profile: ?No results for input(s): CHOL, HDL, LDLCALC, TRIG, CHOLHDL, LDLDIRECT in the last 72 hours. ?Thyroid Function Tests: ?No results for input(s): TSH, T4TOTAL, FREET4, T3FREE, THYROIDAB in the last 72 hours. ?Anemia Panel: ?No results for input(s): VITAMINB12, FOLATE, FERRITIN, TIBC, IRON, RETICCTPCT in the last 72 hours. ?Sepsis Labs: ?No results for input(s): PROCALCITON, LATICACIDVEN in the last 168 hours. ? ?Recent Results (from the past 240 hour(s))  ?Resp Panel by RT-PCR (Flu A&B, Covid) Nasopharyngeal Swab     Status: None  ? Collection Time: 07/13/21  2:45 PM  ? Specimen: Nasopharyngeal Swab; Nasopharyngeal(NP) swabs in vial transport medium  ?Result Value Ref Range Status  ? SARS Coronavirus 2 by RT PCR NEGATIVE NEGATIVE Final  ?  Comment: (NOTE) ?SARS-CoV-2 target nucleic acids are NOT DETECTED. ? ?The SARS-CoV-2 RNA is generally detectable in upper respiratory ?specimens during the acute phase of infection. The lowest ?concentration of SARS-CoV-2 viral copies this assay  can detect is ?138 copies/mL. A negative result does not preclude SARS-Cov-2 ?infection and should not be used as the sole basis for treatment or ?other patient management decisions. A negative result may o

## 2021-07-14 NOTE — Evaluation (Signed)
Clinical/Bedside Swallow Evaluation ?Patient Details  ?Name: [name] ?MRN: [id number] ?Date of Birth: 08/19/1960 ? ?Today's Date: 07/14/2021 ?Time: SLP Start Time (ACUTE ONLY): 1017 SLP Stop Time (ACUTE ONLY): 1045 ?SLP Time Calculation (min) (ACUTE ONLY): 28 min ? ?Past Medical History:  ?Past Medical History:  ?Diagnosis Date  ? Anxiety   ? Arthritis   ? Asthma   ? Bipolar disorder (HCC)   ? Chronic pain   ? COPD (chronic obstructive pulmonary disease) (HCC)   ? Degenerative disk disease   ? GERD (gastroesophageal reflux disease)   ? Hiatal hernia   ? History of pneumonia   ? PE (pulmonary embolism) 2012  ? Protein S deficiency (HCC)   ? ?Past Surgical History:  ?Past Surgical History:  ?Procedure Laterality Date  ? CHOLECYSTECTOMY    ? CLOSED REDUCTION MANDIBLE WITH MANDIBULOMA  10/21/2011  ? Procedure: CLOSED REDUCTION MANDIBLE WITH MANDIBULOMAXILLARY FUSION;  Surgeon: Darletta Moll, MD;  Location: [facility] OR;  Service: ENT;  Laterality: N/A;  MMF screws  ? HAND SURGERY Right 20 years ago  ? INCISIONAL HERNIA REPAIR N/A 10/27/2018  ? Procedure: HERNIA REPAIR INCISIONAL WITH MESH;  Surgeon: Franky Macho, MD;  Location: [facility] ORS;  Service: General;  Laterality: N/A;  ? INCISIONAL HERNIA REPAIR N/A 02/23/2019  ? Procedure: LAPAROSCOPIC INCISIONAL HERNIA REPAIR WITH MESH;  Surgeon: Franky Macho, MD;  Location: [facility] ORS;  Service: General;  Laterality: N/A;  ? LEG SURGERY    ? MANDIBULAR HARDWARE REMOVAL  11/11/2011  ? Procedure: MANDIBULAR HARDWARE REMOVAL;  Surgeon: Darletta Moll, MD;  Location: [facility];  Service: ENT;  Laterality: N/A;  ? ORIF MANDIBULAR FRACTURE  10/21/2011  ? Procedure: OPEN REDUCTION INTERNAL FIXATION (ORIF) MANDIBULAR FRACTURE;  Surgeon: Darletta Moll, MD;  Location: [facility] OR;  Service: ENT;  Laterality: N/A;  ? UMBILICAL HERNIA REPAIR    ? ?HPI:  ?[name] is a 61 y.o. male with medical history significant for COPD, pulmonary embolism, necrotizing pneumonia, protein S deficiency.  Patient  presented to the ED with complaints of coughing of 4 to 6 weeks.  Reports initially he was having streaks of blood in his sputum, but today he saw blood clots.  Reports associated difficulty breathing.  Reports a fever about a week ago, he does not remember how high it was.  Patient has had pneumonia several times in the past, and he felt he had a pneumonia so he came to the ED. Pt known to this SLP from MBSS completed in 2021 (no gross aspiration, but penetration to the cords with straw sips).  ?  ?Assessment / Plan / Recommendation  ?Clinical Impression ? Clinical swallow evaluation completed at bedside. Pt known to this SLP from MBSS completed July 2021 and Pt recalls this therapist and the swallow study. Oral motor examination is WNL, Pt missing some dentition. Pt states that he is down to 5 cigarettes a day and no longer vapes. He states that he thinks he has had pneumonia 20 times in the last 20 years. 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 He denies coughing during po intake at home. He does report reflux symptoms and does not sleep with HOB elevated at home. Previous imaging reviewed in room with Pt (MBSS from July 2021). Pt did not have any episodes of aspiration, but did have thin liquids penetrate to the level of the vocal folds with straw sips. Pt self presented all boluses today and had 2  episodes of delayed cough after sips of thin liquids. Recommend regular textures and thin liquids, no straws, and Pt to take small sips (hold in mouth briefly, swallow, clear throat, swallow again) and to swallow solid foods with a "hard" or effortful swallow. SLP will follow up tomorrow. Can consider esophageal assessment to determine if reflux may be playing a part in recurring PNAs. ?SLP Visit Diagnosis: Dysphagia, unspecified (R13.10) ?   ?Aspiration Risk ? Mild aspiration risk  ?  ?Diet Recommendation Regular;Thin liquid  ? ?Liquid Administration via: Cup;No straw ?Medication Administration:  Whole meds with liquid ?Supervision: Patient able to self feed ?Compensations: Small sips/bites;Clear throat intermittently;Effortful swallow ?Postural Changes: Seated upright at 90 degrees;Remain upright for at least 30 minutes after po intake  ?  ?Other  Recommendations Recommended Consults: Consider esophageal assessment ?Oral Care Recommendations: Oral care BID ?Other Recommendations: Clarify dietary restrictions   ? ?Recommendations for follow up therapy are one component of a multi-disciplinary discharge planning process, led by the attending physician.  Recommendations may be updated based on patient status, additional functional criteria and insurance authorization. ? ?Follow up Recommendations No SLP follow up  ? ? ?  ?Assistance Recommended at Discharge None  ?Functional Status Assessment Patient has had a recent decline in their functional status and demonstrates the ability to make significant improvements in function in a reasonable and predictable amount of time.  ?Frequency and Duration min 2x/week  ?1 week ?  ?   ? ?Prognosis Prognosis for Safe Diet Advancement: Good  ? ?  ? ?Swallow Study   ?General Date of Onset: 07/13/21 ?HPI: [name] is a 61 y.o. male with medical history significant for COPD, pulmonary embolism, necrotizing pneumonia, protein S deficiency.  Patient presented to the ED with complaints of coughing of 4 to 6 weeks.  Reports initially he was having streaks of blood in his sputum, but today he saw blood clots.  Reports associated difficulty breathing.  Reports a fever about a week ago, he does not remember how high it was.  Patient has had pneumonia several times in the past, and he felt he had a pneumonia so he came to the ED. Pt known to this SLP from MBSS completed in 2021 (no gross aspiration, but penetration to the cords with straw sips). ?Type of Study: Bedside Swallow Evaluation ?Previous Swallow Assessment: MBSS 2021 ?Diet Prior to this Study: Regular;Thin  liquids ?Temperature Spikes Noted: No ?Respiratory Status: Room air ?History of Recent Intubation: No ?Behavior/Cognition: Alert;Cooperative;Pleasant mood ?Oral Cavity Assessment: Within Functional Limits ?Oral Care Completed by SLP: No ?Oral Cavity - Dentition: Adequate natural dentition;Missing dentition ?Vision: Functional for self-feeding ?Self-Feeding Abilities: Able to feed self ?Patient Positioning: Upright in bed (sitting on edge of bed) ?Baseline Vocal Quality: Normal ?Volitional Cough: Strong ?Volitional Swallow: Able to elicit  ?  ?Oral/Motor/Sensory Function Overall Oral Motor/Sensory Function: Within functional limits   ?Ice Chips Ice chips: Within functional limits ?Presentation: Spoon   ?Thin Liquid Thin Liquid: Impaired ?Presentation: Cup;Straw;Self Fed ?Pharyngeal  Phase Impairments: Cough - Delayed (post swallow)  ?  ?Nectar Thick Nectar Thick Liquid: Not tested   ?Honey Thick Honey Thick Liquid: Not tested   ?Puree Puree: Within functional limits ?Presentation: Self Fed;Spoon   ?Solid ? ? ?  Solid: Within functional limits ?Presentation: Self Fed  ? ?  ?Thank you, ? ?Havery Moros, CCC-SLP ?[phone] ? ?Padraic Marinos ?07/14/2021,11:07 AM ? ? ? ? ?

## 2021-07-15 DIAGNOSIS — J690 Pneumonitis due to inhalation of food and vomit: Secondary | ICD-10-CM | POA: Diagnosis not present

## 2021-07-15 LAB — BASIC METABOLIC PANEL
Anion gap: 8 (ref 5–15)
BUN: 17 mg/dL (ref 6–20)
CO2: 26 mmol/L (ref 22–32)
Calcium: 8.9 mg/dL (ref 8.9–10.3)
Chloride: 104 mmol/L (ref 98–111)
Creatinine, Ser: 0.53 mg/dL — ABNORMAL LOW (ref 0.61–1.24)
GFR, Estimated: >60 mL/min (ref >60)
Glucose, Bld: 123 mg/dL — ABNORMAL HIGH (ref 70–99)
Potassium: 3.9 mmol/L (ref 3.5–5.1)
Sodium: 138 mmol/L (ref 135–145)

## 2021-07-15 LAB — CBC WITH DIFFERENTIAL/PLATELET
Abs Immature Granulocytes: 0.03 10*3/uL (ref 0.00–0.07)
Basophils Absolute: 0 10*3/uL (ref 0.0–0.1)
Basophils Relative: 0 %
Eosinophils Absolute: 0.1 10*3/uL (ref 0.0–0.5)
Eosinophils Relative: 1 %
HCT: 33.2 % — ABNORMAL LOW (ref 39.0–52.0)
Hemoglobin: 10.8 g/dL — ABNORMAL LOW (ref 13.0–17.0)
Immature Granulocytes: 0 %
Lymphocytes Relative: 13 %
Lymphs Abs: 1.2 10*3/uL (ref 0.7–4.0)
MCH: 29.3 pg (ref 26.0–34.0)
MCHC: 32.5 g/dL (ref 30.0–36.0)
MCV: 90.2 fL (ref 80.0–100.0)
Monocytes Absolute: 0.7 10*3/uL (ref 0.1–1.0)
Monocytes Relative: 8 %
Neutro Abs: 7 10*3/uL (ref 1.7–7.7)
Neutrophils Relative %: 78 %
Platelets: 256 10*3/uL (ref 150–400)
RBC: 3.68 MIL/uL — ABNORMAL LOW (ref 4.22–5.81)
RDW: 12.7 % (ref 11.5–15.5)
WBC: 9 10*3/uL (ref 4.0–10.5)
nRBC: 0 % (ref 0.0–0.2)

## 2021-07-15 MED ORDER — AMOXICILLIN-POT CLAVULANATE 875-125 MG PO TABS: 1.0000 | ORAL_TABLET | Freq: Two times a day (BID) | ORAL | 0 refills | Status: AC

## 2021-07-15 MED ORDER — BISACODYL 10 MG RE SUPP
10.0000 mg | Freq: Once | RECTAL | Status: AC
  Administered 2021-07-15: 10 mg via RECTAL
  Filled 2021-07-15: qty 1

## 2021-07-15 MED ORDER — PREDNISONE 50 MG PO TABS: 50.0000 mg | ORAL_TABLET | Freq: Every day | ORAL | 0 refills | Status: AC

## 2021-07-15 NOTE — Discharge Summary (Addendum)
PatientPhysician Discharge Summary  ?[name] [id number] DOB: May 23, 1960 DOA: 07/13/2021 ? ?PCP: Alanson Puls, [facility] ? ?Admit date: 07/13/2021 ?Discharge date: 07/15/2021 ?30 Day Unplanned Readmission Risk Score   ? ?Flowsheet Row ED to Hosp-Admission (Current) from 07/13/2021 in [place]  ?30 Day Unplanned Readmission Risk Score (%) 19.87 Filed at 07/15/2021 0801  ? ?  ? ? This score is the patient's risk of an unplanned readmission within 30 days of being discharged (0 -100%). The score is based on dignosis, age, lab data, medications, orders, and past utilization.   ?Low:  0-14.9   Medium: 15-21.9   High: 22-29.9   Extreme: 30 and above ? ?  ? ?  ? ? ? ?Admitted From: Home ?Disposition: Home ? ?Recommendations for Outpatient Follow-up:  ?Follow up with PCP in 1-2 weeks ?Please obtain BMP/CBC in one week ?PCP to arrange follow-up CT chest in 2 to 3 months. ?Please follow up with your PCP on the following pending results: ?Unresulted Labs (From admission, onward)  ? ?  Start     Ordered  ? 07/15/21 0000000  Basic metabolic panel  Once,   R       ? 07/15/21 0804  ? 07/13/21 1958  Legionella Pneumophila Serogp 1 Ur Ag  (COPD / Pneumonia / Cellulitis / Lower Extremity Wound)  Once,   R       ? 07/13/21 1958  ? 07/13/21 1958  Strep pneumoniae urinary antigen  (COPD / Pneumonia / Cellulitis / Lower Extremity Wound)  Once,   R       ? 07/13/21 1958  ? ?  ?  ? ?  ?  ? ? ?Home Health: None ?Equipment/Devices: None ? ?Discharge Condition: Stable ?CODE STATUS: Full code ?Diet recommendation: Cardiac ? ?Subjective: Seen and examined.  Feels better, breathing improved.  Complains of constipation.  No other complaint. ? ?Brief/Interim Summary: [name] is a 61 y.o. male with medical history significant for COPD, pulmonary embolism, necrotizing pneumonia, protein S deficiency. ?Patient presented to the ED with complaints of coughing of 4 to 6 weeks with scant hemoptysis recently, no chest pain.   Reported fever about a week ago.  Patient has had pneumonia several times in the past, and he felt he had a pneumonia so he came to the ED. ?Reports history of aspiration, diagnosed when he had a swallow evaluation in the past.   ? ?Upon arrival to ED, he was fairly hemodynamically stable and not hypoxic.  Influenza and COVID-negative. CTA chest shows masslike opacity in the right base but radiologist favors pneumonia over malignancy due to short duration of the development.  Recommends follow-up as outpatient with repeat scan in 2 to 3 months after treating pneumonia  IV Unasyn started for possible aspiration pneumonia.  Admitted under hospitalist service.  Next morning, he was also found to have some wheezes.  He was then diagnosed with acute COPD exacerbation as well but never required any oxygen and he was not hypoxic.  He felt much better today.  No more wheezes on exam and rhonchi improved as well.  Patient is now being discharged home with 7 days of oral Augmentin.  Repeat CT chest to be done in 2 to 3 months, defer to PCP to arrange that.  For his hemoptysis, we held his Xarelto and he has not had any hemoptysis but at this point in time, we are resuming his Xarelto because of his protein S deficiency which makes him  high risk for blood clots.  He was also assessed by SLP during this hospitalization and was thought to be having mild aspiration risk.  They recommended to continue regular diet with thin liquid. ? ?Discharge plan was discussed with patient and/or family member and they verbalized understanding and agreed with it.  ?Discharge Diagnoses:  ?Principal Problem: ?  Aspiration pneumonia ([place]) ?Active Problems: ?  Hx of pulmonary embolus ?  COPD with acute exacerbation ([place]) ?  Polysubstance dependence including opioid type drug, episodic abuse ([place]) ? ? ? ?Discharge Instructions ? ? ?Allergies as of 07/15/2021   ?No Known Allergies ?  ? ?  ?Medication List  ?  ? ?STOP taking these medications    ? ?Advair HFA 45-21 MCG/ACT inhaler ?Generic drug: fluticasone-salmeterol ?  ?clonazePAM 1 MG tablet ?Commonly known as: KLONOPIN ?  ?ferrous sulfate 324 MG Tbec ?  ?fluticasone furoate-vilanterol 100-25 MCG/ACT Aepb ?Commonly known as: BREO ELLIPTA ?  ?Stiolto Respimat 2.5-2.5 MCG/ACT Aers ?Generic drug: Tiotropium Bromide-Olodaterol ?  ? ?  ? ?TAKE these medications   ? ?albuterol 108 (90 Base) MCG/ACT inhaler ?Commonly known as: VENTOLIN HFA ?Inhale 2 puffs into the lungs every 4 (four) hours as needed for wheezing or shortness of breath. ?  ?ALPRAZolam 0.5 MG tablet ?Commonly known as: Duanne Moron ?Take 0.5 mg by mouth 3 (three) times daily. ?  ?amoxicillin-clavulanate 875-125 MG tablet ?Commonly known as: Augmentin ?Take 1 tablet by mouth 2 (two) times daily for 7 days. ?  ?amphetamine-dextroamphetamine 20 MG tablet ?Commonly known as: ADDERALL ?Take 20 mg by mouth 3 (three) times daily. ?  ?atorvastatin 10 MG tablet ?Commonly known as: LIPITOR ?Take 1 tablet by mouth daily. ?  ?fluticasone 50 MCG/ACT nasal spray ?Commonly known as: FLONASE ?Place 2 sprays into both nostrils daily as needed for congestion. ?  ?gabapentin 300 MG capsule ?Commonly known as: Neurontin ?Take 1 capsule (300 mg total) by mouth 3 (three) times daily. ?  ?guaiFENesin-dextromethorphan 100-10 MG/5ML syrup ?Commonly known as: ROBITUSSIN DM ?Take 5 mLs by mouth every 4 (four) hours as needed for cough. ?  ?meclizine 12.5 MG tablet ?Commonly known as: ANTIVERT ?Take 25 mg by mouth 3 (three) times daily as needed for dizziness. ?  ?Milk Thistle 1000 MG Caps ?Take 1,000 mg by mouth daily. ?  ?omeprazole 40 MG capsule ?Commonly known as: PRILOSEC ?Take 40 mg by mouth daily. ?  ?ondansetron 4 MG tablet ?Commonly known as: ZOFRAN ?Take 4 mg by mouth every 6 (six) hours as needed for dizziness or nausea. ?  ?predniSONE 50 MG tablet ?Commonly known as: DELTASONE ?Take 1 tablet (50 mg total) by mouth daily with breakfast for 4 days. ?  ?rivaroxaban 20  MG Tabs tablet ?Commonly known as: XARELTO ?Take 20 mg by mouth daily. ?  ?Suboxone 8-2 MG Film ?Generic drug: Buprenorphine HCl-Naloxone HCl ?Take 1 Film by mouth in the morning, at noon, and at bedtime. ?  ?triamcinolone cream 0.5 % ?Commonly known as: KENALOG ?Apply 1 application. topically 2 (two) times daily as needed (poison oak/poison ivy). ?  ? ?  ? ? Follow-up Information   ? ? [facility] Follow up in 1 week(s).   ?Contact information: ?[address]. ?[place] [address] ?[phone] ? ? ?  ?  ? ?  ?  ? ?  ? ?No Known Allergies ? ?Consultations: None ? ? ?Procedures/Studies: ?DG Chest 2 View ? ?Result Date: 07/13/2021 ?CLINICAL DATA:  Shortness of breath, coughing blood. EXAM: CHEST -  2 VIEW COMPARISON:  Chest x-ray 01/22/2021. FINDINGS: There is a new right infrahilar masslike density measuring 3.4 x 3.2 cm. The lungs are otherwise clear. There is no pleural effusion or pneumothorax. Cardiomediastinal silhouette is otherwise within normal limits. No acute fractures are seen. IMPRESSION: 1. New right infrahilar masslike density may represent focal pneumonia, neoplasm or adenopathy. Recommend further evaluation with chest CT. Electronically Signed   By: Ronney Asters M.D.   On: 07/13/2021 15:24  ? ?CT Angio Chest PE W and/or Wo Contrast ? ?Result Date: 07/13/2021 ?CLINICAL DATA:  Suspected pulmonary embolus. Cavitary pneumonia. Hemoptysis for 6 weeks with worsening shortness of breath. EXAM: CT ANGIOGRAPHY CHEST WITH CONTRAST TECHNIQUE: Multidetector CT imaging of the chest was performed using the standard protocol during bolus administration of intravenous contrast. Multiplanar CT image reconstructions and MIPs were obtained to evaluate the vascular anatomy. RADIATION DOSE REDUCTION: This exam was performed according to the departmental dose-optimization program which includes automated exposure control, adjustment of the mA and/or kV according to patient size and/or use of iterative  reconstruction technique. CONTRAST:  28mL OMNIPAQUE IOHEXOL 350 MG/ML SOLN COMPARISON:  March 15, 2021 FINDINGS: Cardiovascular: The thoracic aorta, heart, and coronary arteries are unchanged unremarkable. No pulmonary em

## 2021-07-15 NOTE — Progress Notes (Signed)
Patient noted to have pocket knife in room , gave it to security  ?

## 2021-07-15 NOTE — Plan of Care (Signed)
  Problem: Activity: Goal: Ability to tolerate increased activity will improve Outcome: Progressing   Problem: Respiratory: Goal: Ability to maintain adequate ventilation will improve Outcome: Progressing   Problem: Education: Goal: Knowledge of General Education information will improve Description: Including pain rating scale, medication(s)/side effects and non-pharmacologic comfort measures Outcome: Progressing   

## 2021-07-15 NOTE — TOC Progression Note (Signed)
Transition of Care (TOC) - Progression Note  ? ? ?Patient Details  ?Name: [name] ?MRN: [id number] ?Date of Birth: [birth year] ? ?Transition of Care (TOC) CM/SW Contact  ?Karn Cassis, LCSW ?Phone Number: ?07/15/2021, 10:33 AM ? ?Clinical Narrative:  ?Transition of Care (TOC) Screening Note ? ? ?Patient Details  ?Name: [name] ?Date of Birth: 08-17-1960 ? ? ?Transition of Care (TOC) CM/SW Contact:    ?Karn Cassis, LCSW ?Phone Number: ?07/15/2021, 10:33 AM ? ? ? ?Transition of Care Department [facility]) has reviewed patient and no TOC needs have been identified at this time. We will continue to monitor patient advancement through interdisciplinary progression rounds. If new patient transition needs arise, please place a TOC consult. ?    ? ? ? ?  ?  ? ?Expected Discharge Plan and Services ?  ?  ?  ?  ?  ?Expected Discharge Date: 07/15/21               ?  ?  ?  ?  ?  ?  ?  ?  ?  ?  ? ? ?Social Determinants of Health (SDOH) Interventions ?  ? ?Readmission Risk Interventions ? ?  10/18/2019  ? 11:58 AM  ?Readmission Risk Prevention Plan  ?Transportation Screening Complete  ?PCP or Specialist Appt within 3-5 Days Complete  ?HRI or Home Care Consult Complete  ?Social Work Consult for Recovery Care Planning/Counseling Complete  ?Palliative Care Screening Not Applicable  ?Medication Review [facility]) Complete  ? ? ?

## 2021-07-18 LAB — CULTURE, BLOOD (ROUTINE X 2)
Culture: NO GROWTH
Culture: NO GROWTH
Special Requests: ADEQUATE

## 2021-07-31 ENCOUNTER — Encounter: Payer: Self-pay | Admitting: Internal Medicine
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
## 2021-08-26 ENCOUNTER — Ambulatory Visit (INDEPENDENT_AMBULATORY_CARE_PROVIDER_SITE_OTHER): Payer: Medicare Other | Admitting: Gastroenterology

## 2021-08-26 ENCOUNTER — Other Ambulatory Visit: Payer: Self-pay

## 2021-08-26 ENCOUNTER — Encounter: Payer: Self-pay | Admitting: Gastroenterology

## 2021-08-26 VITALS — BP 112/64 | HR 90 | Temp 97.5°F | Ht 62.0 in | Wt 202.0 lb

## 2021-08-26 DIAGNOSIS — F1920 Other psychoactive substance dependence, uncomplicated: Secondary | ICD-10-CM | POA: Insufficient documentation

## 2021-08-26 DIAGNOSIS — Z79899 Other long term (current) drug therapy: Secondary | ICD-10-CM | POA: Diagnosis not present

## 2021-08-26 DIAGNOSIS — M79605 Pain in left leg: Secondary | ICD-10-CM | POA: Diagnosis not present

## 2021-08-26 DIAGNOSIS — K5900 Constipation, unspecified: Secondary | ICD-10-CM | POA: Diagnosis not present

## 2021-08-26 DIAGNOSIS — Z7901 Long term (current) use of anticoagulants: Secondary | ICD-10-CM | POA: Diagnosis not present

## 2021-08-26 DIAGNOSIS — K219 Gastro-esophageal reflux disease without esophagitis: Secondary | ICD-10-CM | POA: Diagnosis not present

## 2021-08-26 DIAGNOSIS — B182 Chronic viral hepatitis C: Secondary | ICD-10-CM

## 2021-08-26 DIAGNOSIS — R1312 Dysphagia, oropharyngeal phase: Secondary | ICD-10-CM

## 2021-08-26 DIAGNOSIS — F17210 Nicotine dependence, cigarettes, uncomplicated: Secondary | ICD-10-CM | POA: Diagnosis not present

## 2021-08-26 DIAGNOSIS — T24292A Burn of second degree of multiple sites of left lower limb, except ankle and foot, initial encounter: Principal | ICD-10-CM | POA: Insufficient documentation

## 2021-08-26 DIAGNOSIS — F1120 Opioid dependence, uncomplicated: Secondary | ICD-10-CM | POA: Diagnosis not present

## 2021-08-26 DIAGNOSIS — X088XXA Exposure to other specified smoke, fire and flames, initial encounter: Secondary | ICD-10-CM | POA: Insufficient documentation

## 2021-08-26 DIAGNOSIS — G8929 Other chronic pain: Secondary | ICD-10-CM | POA: Diagnosis not present

## 2021-08-26 DIAGNOSIS — Z23 Encounter for immunization: Secondary | ICD-10-CM | POA: Insufficient documentation

## 2021-08-26 DIAGNOSIS — J45909 Unspecified asthma, uncomplicated: Secondary | ICD-10-CM | POA: Diagnosis not present

## 2021-08-26 DIAGNOSIS — T310 Burns involving less than 10% of body surface: Secondary | ICD-10-CM | POA: Insufficient documentation

## 2021-08-26 DIAGNOSIS — T300 Burn of unspecified body region, unspecified degree: Secondary | ICD-10-CM

## 2021-08-26 DIAGNOSIS — Z86711 Personal history of pulmonary embolism: Secondary | ICD-10-CM | POA: Diagnosis not present

## 2021-08-26 DIAGNOSIS — J449 Chronic obstructive pulmonary disease, unspecified: Secondary | ICD-10-CM | POA: Insufficient documentation

## 2021-08-26 DIAGNOSIS — K7460 Unspecified cirrhosis of liver: Secondary | ICD-10-CM

## 2021-08-26 DIAGNOSIS — Z8701 Personal history of pneumonia (recurrent): Secondary | ICD-10-CM
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
## 2021-08-27 ENCOUNTER — Inpatient Hospital Stay (HOSPITAL_COMMUNITY): Payer: Medicare Other | Admitting: Anesthesiology

## 2021-08-27 ENCOUNTER — Other Ambulatory Visit: Payer: Self-pay

## 2021-08-27 ENCOUNTER — Emergency Department (HOSPITAL_COMMUNITY): Payer: Medicare Other

## 2021-08-27 ENCOUNTER — Observation Stay (HOSPITAL_COMMUNITY)
Admission: EM | Admit: 2021-08-27 | Discharge: 2021-08-28 | Disposition: A | Discharge status: Home Health | Payer: Medicare Other | Attending: Internal Medicine | Admitting: Internal Medicine

## 2021-08-27 ENCOUNTER — Encounter (HOSPITAL_COMMUNITY): Payer: Self-pay | Admitting: Emergency Medicine

## 2021-08-27 ENCOUNTER — Encounter (HOSPITAL_COMMUNITY)
Admission: EM | Disposition: A | Discharge status: Home Health | Payer: Self-pay | Source: Home / Self Care | Attending: Emergency Medicine

## 2021-08-27 DIAGNOSIS — D6859 Other primary thrombophilia: Secondary | ICD-10-CM | POA: Diagnosis present

## 2021-08-27 DIAGNOSIS — L03116 Cellulitis of left lower limb: Principal | ICD-10-CM

## 2021-08-27 DIAGNOSIS — Z86711 Personal history of pulmonary embolism: Secondary | ICD-10-CM | POA: Diagnosis present

## 2021-08-27 DIAGNOSIS — T24302S Burn of third degree of unspecified site of left lower limb, except ankle and foot, sequela: Secondary | ICD-10-CM | POA: Diagnosis not present

## 2021-08-27 DIAGNOSIS — T24202S Burn of second degree of unspecified site of left lower limb, except ankle and foot, sequela: Secondary | ICD-10-CM | POA: Diagnosis not present

## 2021-08-27 DIAGNOSIS — G8929 Other chronic pain: Secondary | ICD-10-CM | POA: Diagnosis present

## 2021-08-27 DIAGNOSIS — F419 Anxiety disorder, unspecified: Secondary | ICD-10-CM | POA: Diagnosis not present

## 2021-08-27 DIAGNOSIS — K219 Gastro-esophageal reflux disease without esophagitis: Secondary | ICD-10-CM | POA: Diagnosis present

## 2021-08-27 DIAGNOSIS — T24202A Burn of second degree of unspecified site of left lower limb, except ankle and foot, initial encounter: Secondary | ICD-10-CM | POA: Insufficient documentation

## 2021-08-27 DIAGNOSIS — T300 Burn of unspecified body region, unspecified degree: Secondary | ICD-10-CM

## 2021-08-27 DIAGNOSIS — F1920 Other psychoactive substance dependence, uncomplicated: Secondary | ICD-10-CM

## 2021-08-27 DIAGNOSIS — F1120 Opioid dependence, uncomplicated: Secondary | ICD-10-CM | POA: Diagnosis present

## 2021-08-27 DIAGNOSIS — T24292A Burn of second degree of multiple sites of left lower limb, except ankle and foot, initial encounter: Secondary | ICD-10-CM | POA: Diagnosis not present

## 2021-08-27 DIAGNOSIS — Z720 Tobacco use: Secondary | ICD-10-CM | POA: Diagnosis present

## 2021-08-27 HISTORY — PX: INCISION AND DRAINAGE OF WOUND: SHX1803

## 2021-08-27 LAB — COMPREHENSIVE METABOLIC PANEL
ALT: 15 U/L (ref 0–44)
AST: 15 U/L (ref 15–41)
Albumin: 3.5 g/dL (ref 3.5–5.0)
Alkaline Phosphatase: 68 U/L (ref 38–126)
Anion gap: 3 — ABNORMAL LOW (ref 5–15)
BUN: 15 mg/dL (ref 6–20)
CO2: 26 mmol/L (ref 22–32)
Calcium: 8.8 mg/dL — ABNORMAL LOW (ref 8.9–10.3)
Chloride: 108 mmol/L (ref 98–111)
Creatinine, Ser: 0.61 mg/dL (ref 0.61–1.24)
GFR, Estimated: >60 mL/min (ref >60)
Glucose, Bld: 130 mg/dL — ABNORMAL HIGH (ref 70–99)
Potassium: 3.6 mmol/L (ref 3.5–5.1)
Sodium: 137 mmol/L (ref 135–145)
Total Bilirubin: 0.4 mg/dL (ref 0.3–1.2)
Total Protein: 6.6 g/dL (ref 6.5–8.1)

## 2021-08-27 LAB — LACTIC ACID, PLASMA: Lactic Acid, Venous: 1.2 mmol/L (ref 0.5–1.9)

## 2021-08-27 LAB — CBC WITH DIFFERENTIAL/PLATELET
Abs Immature Granulocytes: 0.02 10*3/uL (ref 0.00–0.07)
Basophils Absolute: 0 10*3/uL (ref 0.0–0.1)
Basophils Relative: 1 %
Eosinophils Absolute: 0.2 10*3/uL (ref 0.0–0.5)
Eosinophils Relative: 3 %
HCT: 37.7 % — ABNORMAL LOW (ref 39.0–52.0)
Hemoglobin: 12.5 g/dL — ABNORMAL LOW (ref 13.0–17.0)
Immature Granulocytes: 0 %
Lymphocytes Relative: 27 %
Lymphs Abs: 1.5 10*3/uL (ref 0.7–4.0)
MCH: 29.8 pg (ref 26.0–34.0)
MCHC: 33.2 g/dL (ref 30.0–36.0)
MCV: 89.8 fL (ref 80.0–100.0)
Monocytes Absolute: 0.6 10*3/uL (ref 0.1–1.0)
Monocytes Relative: 10 %
Neutro Abs: 3.4 10*3/uL (ref 1.7–7.7)
Neutrophils Relative %: 59 %
Platelets: 162 10*3/uL (ref 150–400)
RBC: 4.2 MIL/uL — ABNORMAL LOW (ref 4.22–5.81)
RDW: 13.6 % (ref 11.5–15.5)
WBC: 5.8 10*3/uL (ref 4.0–10.5)
nRBC: 0 % (ref 0.0–0.2)

## 2021-08-27 LAB — APTT: aPTT: 33 seconds (ref 24–36)

## 2021-08-27 LAB — PROTIME-INR
INR: 1.1 (ref 0.8–1.2)
Prothrombin Time: 13.7 seconds (ref 11.4–15.2)

## 2021-08-27 SURGERY — IRRIGATION AND DEBRIDEMENT WOUND
Anesthesia: General | Site: Leg Lower | Laterality: Left

## 2021-08-27 MED ORDER — SILVER SULFADIAZINE 1 % EX CREA
TOPICAL_CREAM | CUTANEOUS | Status: DC | PRN
  Administered 2021-08-27: 1 via TOPICAL

## 2021-08-27 MED ORDER — HYDROMORPHONE HCL 1 MG/ML IJ SOLN
0.2500 mg | INTRAMUSCULAR | Status: AC | PRN
  Administered 2021-08-27 (×4): 0.5 mg via INTRAVENOUS
  Filled 2021-08-27 (×3): qty 0.5

## 2021-08-27 MED ORDER — ONDANSETRON HCL 4 MG PO TABS: 4.0000 mg | ORAL_TABLET | Freq: Four times a day (QID) | ORAL | Status: DC | PRN

## 2021-08-27 MED ORDER — OXYCODONE-ACETAMINOPHEN 5-325 MG PO TABS
ORAL_TABLET | ORAL | Status: AC
  Filled 2021-08-27: qty 2

## 2021-08-27 MED ORDER — ONDANSETRON HCL 4 MG/2ML IJ SOLN: 4.0000 mg | Freq: Once | INTRAMUSCULAR | Status: DC | PRN

## 2021-08-27 MED ORDER — LIDOCAINE 2% (20 MG/ML) 5 ML SYRINGE
INTRAMUSCULAR | Status: DC | PRN
  Administered 2021-08-27: 100 mg via INTRAVENOUS

## 2021-08-27 MED ORDER — LIDOCAINE HCL (PF) 2 % IJ SOLN
INTRAMUSCULAR | Status: AC
  Filled 2021-08-27: qty 5

## 2021-08-27 MED ORDER — METOCLOPRAMIDE HCL 5 MG/ML IJ SOLN
10.0000 mg | Freq: Once | INTRAMUSCULAR | Status: AC
Start: 2021-08-27 — End: 2021-08-27
  Administered 2021-08-27: 10 mg via INTRAVENOUS
  Filled 2021-08-27: qty 2

## 2021-08-27 MED ORDER — FENTANYL CITRATE (PF) 100 MCG/2ML IJ SOLN
INTRAMUSCULAR | Status: DC | PRN
  Administered 2021-08-27: 100 ug via INTRAVENOUS
  Administered 2021-08-27 (×3): 50 ug via INTRAVENOUS

## 2021-08-27 MED ORDER — CHLORHEXIDINE GLUCONATE 0.12 % MT SOLN
15.0000 mL | Freq: Once | OROMUCOSAL | Status: AC
  Administered 2021-08-27: 15 mL via OROMUCOSAL

## 2021-08-27 MED ORDER — KETOROLAC TROMETHAMINE 30 MG/ML IJ SOLN: 30.0000 mg | Freq: Four times a day (QID) | INTRAMUSCULAR | Status: DC | PRN

## 2021-08-27 MED ORDER — ONDANSETRON HCL 4 MG/2ML IJ SOLN
INTRAMUSCULAR | Status: DC | PRN
  Administered 2021-08-27: 4 mg via INTRAVENOUS

## 2021-08-27 MED ORDER — OXYCODONE-ACETAMINOPHEN 5-325 MG PO TABS
2.0000 | ORAL_TABLET | ORAL | Status: DC | PRN
  Administered 2021-08-27: 2 via ORAL

## 2021-08-27 MED ORDER — PROPOFOL 500 MG/50ML IV EMUL
INTRAVENOUS | Status: DC | PRN
  Administered 2021-08-27: 70 ug/kg/min via INTRAVENOUS

## 2021-08-27 MED ORDER — PROPOFOL 10 MG/ML IV BOLUS
INTRAVENOUS | Status: AC
  Filled 2021-08-27: qty 20

## 2021-08-27 MED ORDER — BUPRENORPHINE HCL-NALOXONE HCL 8-2 MG SL SUBL
1.0000 | SUBLINGUAL_TABLET | Freq: Three times a day (TID) | SUBLINGUAL | Status: DC
  Administered 2021-08-27 – 2021-08-28 (×4): 1 via SUBLINGUAL
  Filled 2021-08-27 (×4): qty 1

## 2021-08-27 MED ORDER — POVIDONE-IODINE 10 % EX OINT
TOPICAL_OINTMENT | CUTANEOUS | Status: AC
  Filled 2021-08-27: qty 1

## 2021-08-27 MED ORDER — SILVER SULFADIAZINE 1 % EX CREA
TOPICAL_CREAM | CUTANEOUS | Status: AC
  Filled 2021-08-27: qty 50

## 2021-08-27 MED ORDER — KETAMINE HCL 10 MG/ML IJ SOLN
INTRAMUSCULAR | Status: DC | PRN
  Administered 2021-08-27 (×2): 25 mg via INTRAVENOUS

## 2021-08-27 MED ORDER — LACTATED RINGERS IV SOLN: INTRAVENOUS | Status: DC

## 2021-08-27 MED ORDER — PROPOFOL 10 MG/ML IV BOLUS
INTRAVENOUS | Status: DC | PRN
  Administered 2021-08-27: 30 mg via INTRAVENOUS
  Administered 2021-08-27: 50 mg via INTRAVENOUS

## 2021-08-27 MED ORDER — ONDANSETRON HCL 4 MG/2ML IJ SOLN
INTRAMUSCULAR | Status: AC
  Filled 2021-08-27: qty 2

## 2021-08-27 MED ORDER — SILVER SULFADIAZINE 1 % EX CREA
TOPICAL_CREAM | Freq: Every day | CUTANEOUS | Status: DC
  Administered 2021-08-28: 1 via TOPICAL
  Filled 2021-08-27: qty 85

## 2021-08-27 MED ORDER — ONDANSETRON HCL 4 MG/2ML IJ SOLN: 4.0000 mg | Freq: Four times a day (QID) | INTRAMUSCULAR | Status: DC | PRN

## 2021-08-27 MED ORDER — RIVAROXABAN 20 MG PO TABS
20.0000 mg | ORAL_TABLET | Freq: Every day | ORAL | Status: DC
  Administered 2021-08-27 – 2021-08-28 (×2): 20 mg via ORAL
  Filled 2021-08-27 (×2): qty 1

## 2021-08-27 MED ORDER — SODIUM CHLORIDE 0.9 % IV SOLN: INTRAVENOUS | Status: DC

## 2021-08-27 MED ORDER — ACETAMINOPHEN 650 MG RE SUPP: 650.0000 mg | Freq: Four times a day (QID) | RECTAL | Status: DC | PRN

## 2021-08-27 MED ORDER — TETANUS-DIPHTH-ACELL PERTUSSIS 5-2.5-18.5 LF-MCG/0.5 IM SUSY
0.5000 mL | PREFILLED_SYRINGE | Freq: Once | INTRAMUSCULAR | Status: AC
  Administered 2021-08-27: 0.5 mL via INTRAMUSCULAR
  Filled 2021-08-27: qty 0.5

## 2021-08-27 MED ORDER — MEPERIDINE HCL 50 MG/ML IJ SOLN: 6.2500 mg | INTRAMUSCULAR | Status: DC | PRN

## 2021-08-27 MED ORDER — FENTANYL CITRATE PF 50 MCG/ML IJ SOSY: 25.0000 ug | PREFILLED_SYRINGE | INTRAMUSCULAR | Status: DC | PRN

## 2021-08-27 MED ORDER — ACETAMINOPHEN 325 MG PO TABS
650.0000 mg | ORAL_TABLET | Freq: Four times a day (QID) | ORAL | Status: DC | PRN
  Administered 2021-08-27: 650 mg via ORAL

## 2021-08-27 MED ORDER — PANTOPRAZOLE SODIUM 40 MG PO TBEC
40.0000 mg | DELAYED_RELEASE_TABLET | Freq: Every evening | ORAL | Status: DC
Start: 2021-08-27 — End: 2021-08-28
  Administered 2021-08-27 – 2021-08-28 (×2): 40 mg via ORAL
  Filled 2021-08-27 (×2): qty 1

## 2021-08-27 MED ORDER — NICOTINE 21 MG/24HR TD PT24: 21.0000 mg | MEDICATED_PATCH | Freq: Every day | TRANSDERMAL | Status: DC | PRN

## 2021-08-27 MED ORDER — ALPRAZOLAM 0.5 MG PO TABS
0.5000 mg | ORAL_TABLET | Freq: Three times a day (TID) | ORAL | Status: DC
  Administered 2021-08-27 – 2021-08-28 (×4): 0.5 mg via ORAL
  Filled 2021-08-27 (×4): qty 1

## 2021-08-27 MED ORDER — BISACODYL 5 MG PO TBEC: 5.0000 mg | DELAYED_RELEASE_TABLET | Freq: Every day | ORAL | Status: DC | PRN

## 2021-08-27 MED ORDER — CHLORHEXIDINE GLUCONATE CLOTH 2 % EX PADS: 6.0000 | MEDICATED_PAD | Freq: Once | CUTANEOUS | Status: DC

## 2021-08-27 MED ORDER — MIDAZOLAM HCL 5 MG/5ML IJ SOLN
INTRAMUSCULAR | Status: DC | PRN
  Administered 2021-08-27: 2 mg via INTRAVENOUS

## 2021-08-27 MED ORDER — POLYETHYLENE GLYCOL 3350 17 G PO PACK
17.0000 g | PACK | Freq: Every day | ORAL | Status: DC
  Administered 2021-08-27 – 2021-08-28 (×2): 17 g via ORAL
  Filled 2021-08-27 (×2): qty 1

## 2021-08-27 MED ORDER — VANCOMYCIN HCL 2000 MG/400ML IV SOLN
2000.0000 mg | Freq: Once | INTRAVENOUS | Status: AC
  Administered 2021-08-27: 2000 mg via INTRAVENOUS
  Filled 2021-08-27: qty 400

## 2021-08-27 MED ORDER — MIDAZOLAM HCL 2 MG/2ML IJ SOLN
INTRAMUSCULAR | Status: AC
  Filled 2021-08-27: qty 2

## 2021-08-27 MED ORDER — FENTANYL CITRATE PF 50 MCG/ML IJ SOSY
50.0000 ug | PREFILLED_SYRINGE | Freq: Once | INTRAMUSCULAR | Status: AC
  Administered 2021-08-27: 50 ug via INTRAVENOUS
  Filled 2021-08-27: qty 1

## 2021-08-27 MED ORDER — ATORVASTATIN CALCIUM 10 MG PO TABS
10.0000 mg | ORAL_TABLET | Freq: Every evening | ORAL | Status: DC
  Administered 2021-08-27 – 2021-08-28 (×2): 10 mg via ORAL
  Filled 2021-08-27 (×2): qty 1

## 2021-08-27 MED ORDER — HYDROMORPHONE HCL 1 MG/ML IJ SOLN
INTRAMUSCULAR | Status: AC
  Filled 2021-08-27: qty 0.5

## 2021-08-27 MED ORDER — KETAMINE HCL 50 MG/5ML IJ SOSY
PREFILLED_SYRINGE | INTRAMUSCULAR | Status: AC
  Filled 2021-08-27: qty 5

## 2021-08-27 MED ORDER — FENTANYL CITRATE (PF) 250 MCG/5ML IJ SOLN
INTRAMUSCULAR | Status: AC
  Filled 2021-08-27: qty 5

## 2021-08-27 MED ORDER — ORAL CARE MOUTH RINSE: 15.0000 mL | Freq: Once | OROMUCOSAL | Status: AC

## 2021-08-27 SURGICAL SUPPLY — 29 items
BANDAGE ELASTIC 6 VELCRO NS (GAUZE/BANDAGES/DRESSINGS) ×1 IMPLANT
BLADE SURG SZ11 CARB STEEL (BLADE) IMPLANT
BNDG GAUZE ROLL STR 2.25X3YD (GAUZE/BANDAGES/DRESSINGS) ×1 IMPLANT
CLOTH BEACON ORANGE TIMEOUT ST (SAFETY) ×2 IMPLANT
COVER LIGHT HANDLE STERIS (MISCELLANEOUS) ×4 IMPLANT
DRAPE EXTREMITY T 121X128X90 (DISPOSABLE) ×1 IMPLANT
DRESSING XEROFORM 5X9 (GAUZE/BANDAGES/DRESSINGS) ×4 IMPLANT
ELECT REM PT RETURN 9FT ADLT (ELECTROSURGICAL) ×2
ELECTRODE REM PT RTRN 9FT ADLT (ELECTROSURGICAL) ×1 IMPLANT
GAUZE PACKING IODOFORM 1X5 (PACKING) IMPLANT
GAUZE SPONGE 4X4 12PLY STRL (GAUZE/BANDAGES/DRESSINGS) ×2 IMPLANT
GAUZE XEROFORM 5X9 LF (GAUZE/BANDAGES/DRESSINGS) ×4 IMPLANT
GLOVE BIO SURGEON STRL SZ 6.5 (GLOVE) ×2 IMPLANT
GLOVE BIOGEL PI IND STRL 6.5 (GLOVE) ×1 IMPLANT
GLOVE BIOGEL PI IND STRL 7.0 (GLOVE) ×2 IMPLANT
GLOVE BIOGEL PI INDICATOR 6.5 (GLOVE) ×1
GLOVE BIOGEL PI INDICATOR 7.0 (GLOVE) ×2
GOWN STRL REUS W/TWL LRG LVL3 (GOWN DISPOSABLE) ×4 IMPLANT
KIT TURNOVER KIT A (KITS) ×2 IMPLANT
MANIFOLD NEPTUNE II (INSTRUMENTS) ×2 IMPLANT
NS IRRIG 1000ML POUR BTL (IV SOLUTION) ×2 IMPLANT
PACK BASIC LIMB (CUSTOM PROCEDURE TRAY) IMPLANT
PACK MINOR (CUSTOM PROCEDURE TRAY) ×1 IMPLANT
PAD ARMBOARD 7.5X6 YLW CONV (MISCELLANEOUS) ×2 IMPLANT
SET BASIN LINEN APH (SET/KITS/TRAYS/PACK) ×2 IMPLANT
SUT VIC AB 3-0 SH 27 (SUTURE)
SUT VIC AB 3-0 SH 27X BRD (SUTURE) IMPLANT
SUT VIC AB 4-0 PS2 27 (SUTURE) IMPLANT
SWAB CULTURE ESWAB REG 1ML (MISCELLANEOUS) ×1 IMPLANT

## 2021-08-27 NOTE — Assessment & Plan Note (Signed)
--   continue topical wound care as ordered.   ?

## 2021-08-27 NOTE — Anesthesia Postprocedure Evaluation (Signed)
Anesthesia Post Note ? ?Patient: [name] ? ?Procedure(s) Performed: IRRIGATION AND DEBRIDEMENT WOUND; left leg (Left: Leg Lower) ? ?Patient location during evaluation: Phase II ?Anesthesia Type: General ?Level of consciousness: awake and alert and oriented ?Pain management: pain level controlled ?Vital Signs Assessment: post-procedure vital signs reviewed and stable ?Respiratory status: spontaneous breathing, nonlabored ventilation and respiratory function stable ?Cardiovascular status: blood pressure returned to baseline and stable ?Postop Assessment: no apparent nausea or vomiting ?Anesthetic complications: no ? ? ?No notable events documented. ? ? ?Last Vitals:  ?Vitals:  ? 08/27/21 1245 08/27/21 1313  ?BP: 107/87 130/77  ?Pulse: 66 70  ?Resp: (!) 21 17  ?Temp:  36.5 ?C  ?SpO2: 98% 99%  ?  ?Last Pain:  ?Vitals:  ? 08/27/21 1330  ?TempSrc:   ?PainSc: 4   ? ? ?  ?  ?  ?  ?  ?  ? ?[name] C [name] ? ? ? ? ?

## 2021-08-27 NOTE — Assessment & Plan Note (Signed)
-   will offer nicotine patch and counseling ?

## 2021-08-27 NOTE — Transfer of Care (Signed)
Immediate Anesthesia Transfer of Care Note ? ?Patient: [name] ? ?Procedure(s) Performed: IRRIGATION AND DEBRIDEMENT WOUND; left leg (Left) ? ?Patient Location: PACU ? ?Anesthesia Type:General ? ?Level of Consciousness: awake, alert  and oriented ? ?Airway & Oxygen Therapy: Patient Spontanous Breathing ? ?Post-op Assessment: Report given to RN, Post -op Vital signs reviewed and stable and Patient moving all extremities X 4 ? ?Post vital signs: Reviewed and stable ? ?Last Vitals:  ?Vitals Value Taken Time  ?BP    ?Temp    ?Pulse 80 08/27/21 1219  ?Resp 11 08/27/21 1219  ?SpO2 98 % 08/27/21 1219  ?Vitals shown include unvalidated device data. ? ?Last Pain:  ?Vitals:  ? 08/27/21 1017  ?TempSrc: Oral  ?PainSc: 9   ?   ? ?Patients Stated Pain Goal: 5 (08/27/21 1017) ? ?Complications: No notable events documented. ?

## 2021-08-27 NOTE — Assessment & Plan Note (Signed)
--   continue home suboxone therapy  ?
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
## 2021-08-27 NOTE — Assessment & Plan Note (Signed)
--   he is life long anticoagulated with rivaroxaban  ?

## 2021-08-27 NOTE — Assessment & Plan Note (Addendum)
--   I spoke with surgeon who agreed to consult while in hospital and evaluate if debridement is needed ? ?-- tetanus booster given 08/27/21 ?
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
## 2021-08-27 NOTE — Anesthesia Procedure Notes (Signed)
Date/Time: 08/27/2021 11:58 AM ?Performed by: Julian Reil, CRNA ?Pre-anesthesia Checklist: Patient identified, Emergency Drugs available, Suction available and Patient being monitored ?Patient Re-evaluated:Patient Re-evaluated prior to induction ?Oxygen Delivery Method: Non-rebreather mask ?Induction Type: IV induction ?Placement Confirmation: positive ETCO2 ? ? ? ? ?
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
## 2021-08-27 NOTE — Assessment & Plan Note (Signed)
--   he is on rivaroxaban chronically  ?

## 2021-08-27 NOTE — Assessment & Plan Note (Signed)
--   reports being on suboxone therapy three times daily  ?

## 2021-08-27 NOTE — Assessment & Plan Note (Signed)
--  protonix ordered for GI protection  ?
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
## 2021-08-27 NOTE — Progress Notes (Signed)
?  Transition of Care (TOC) Screening Note ? ? ?Patient Details  ?Name: [name] ?Date of Birth: 09/18/1960 ? ? ?Transition of Care (TOC) CM/SW Contact:    ?Lamichael Youkhana D, LCSW ?Phone Number: ?08/27/2021, 1:29 PM ? ? ? ?Transition of Care Department [facility]) has reviewed patient and no TOC needs have been identified at this time. We will continue to monitor patient advancement through interdisciplinary progression rounds. If new patient transition needs arise, please place a TOC consult. ?  ?

## 2021-08-27 NOTE — Assessment & Plan Note (Signed)
--   resumed home anxiolytics  ?

## 2021-08-28 ENCOUNTER — Encounter (HOSPITAL_COMMUNITY): Payer: Self-pay | Admitting: General Surgery

## 2021-08-28 DIAGNOSIS — G8929 Other chronic pain: Secondary | ICD-10-CM | POA: Diagnosis not present

## 2021-08-28 DIAGNOSIS — T24202A Burn of second degree of unspecified site of left lower limb, except ankle and foot, initial encounter: Secondary | ICD-10-CM | POA: Diagnosis not present

## 2021-08-28 DIAGNOSIS — Z86711 Personal history of pulmonary embolism: Secondary | ICD-10-CM

## 2021-08-28 DIAGNOSIS — T24202S Burn of second degree of unspecified site of left lower limb, except ankle and foot, sequela: Secondary | ICD-10-CM | POA: Diagnosis not present

## 2021-08-28 DIAGNOSIS — F419 Anxiety disorder, unspecified: Secondary | ICD-10-CM | POA: Diagnosis not present

## 2021-08-28 DIAGNOSIS — T24292A Burn of second degree of multiple sites of left lower limb, except ankle and foot, initial encounter: Secondary | ICD-10-CM | POA: Diagnosis not present

## 2021-08-28 LAB — CBC WITH DIFFERENTIAL/PLATELET
Abs Immature Granulocytes: 0.01 10*3/uL (ref 0.00–0.07)
Basophils Absolute: 0 10*3/uL (ref 0.0–0.1)
Basophils Relative: 1 %
Eosinophils Absolute: 0.2 10*3/uL (ref 0.0–0.5)
Eosinophils Relative: 3 %
HCT: 36.4 % — ABNORMAL LOW (ref 39.0–52.0)
Hemoglobin: 12.2 g/dL — ABNORMAL LOW (ref 13.0–17.0)
Immature Granulocytes: 0 %
Lymphocytes Relative: 34 %
Lymphs Abs: 1.8 10*3/uL (ref 0.7–4.0)
MCH: 30.5 pg (ref 26.0–34.0)
MCHC: 33.5 g/dL (ref 30.0–36.0)
MCV: 91 fL (ref 80.0–100.0)
Monocytes Absolute: 0.4 10*3/uL (ref 0.1–1.0)
Monocytes Relative: 8 %
Neutro Abs: 2.8 10*3/uL (ref 1.7–7.7)
Neutrophils Relative %: 54 %
Platelets: 162 10*3/uL (ref 150–400)
RBC: 4 MIL/uL — ABNORMAL LOW (ref 4.22–5.81)
RDW: 13.6 % (ref 11.5–15.5)
WBC: 5.2 10*3/uL (ref 4.0–10.5)
nRBC: 0 % (ref 0.0–0.2)

## 2021-08-28 LAB — CBC
HCT: 40.6 % (ref 39.0–52.0)
Hemoglobin: 12.8 g/dL — ABNORMAL LOW (ref 13.0–17.0)
MCH: 28.5 pg (ref 26.0–34.0)
MCHC: 31.5 g/dL (ref 30.0–36.0)
MCV: 90.4 fL (ref 80.0–100.0)
Platelets: 155 10*3/uL (ref 150–400)
RBC: 4.49 MIL/uL (ref 4.22–5.81)
RDW: 13.5 % (ref 11.5–15.5)
WBC: 6.1 10*3/uL (ref 4.0–10.5)
nRBC: 0 % (ref 0.0–0.2)

## 2021-08-28 LAB — BASIC METABOLIC PANEL
Anion gap: 2 — ABNORMAL LOW (ref 5–15)
BUN: 15 mg/dL (ref 6–20)
CO2: 29 mmol/L (ref 22–32)
Calcium: 8.4 mg/dL — ABNORMAL LOW (ref 8.9–10.3)
Chloride: 108 mmol/L (ref 98–111)
Creatinine, Ser: 0.69 mg/dL (ref 0.61–1.24)
GFR, Estimated: >60 mL/min (ref >60)
Glucose, Bld: 94 mg/dL (ref 70–99)
Potassium: 4.2 mmol/L (ref 3.5–5.1)
Sodium: 139 mmol/L (ref 135–145)

## 2021-08-28 LAB — MAGNESIUM: Magnesium: 1.8 mg/dL (ref 1.7–2.4)

## 2021-08-28 MED ORDER — SILVER SULFADIAZINE 1 % EX CREA: TOPICAL_CREAM | Freq: Every day | CUTANEOUS | Status: DC

## 2021-08-28 MED ORDER — NICOTINE 21 MG/24HR TD PT24: 21.0000 mg | MEDICATED_PATCH | Freq: Every day | TRANSDERMAL | 0 refills | Status: DC | PRN

## 2021-08-28 NOTE — Care Management CC44 (Signed)
Condition Code 44 Documentation Completed ? ?Patient Details  ?Name: [name] ?MRN: [id number] ?Date of Birth: Feb 10, 1961 ? ? ?Condition Code 44 given:  Yes ?Patient signature on Condition Code 44 notice:  Yes ?Documentation of 2 MD's agreement:  Yes ?Code 44 added to claim:  Yes ? ? ? ?Karn Cassis, LCSW ?08/28/2021, 3:19 PM ? ?
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
## 2021-08-28 NOTE — TOC Transition Note (Signed)
Transition of Care (TOC) - CM/SW Discharge Note ? ? ?Patient Details  ?Name: TANVEER BRAMMER ?MRN: [id number] ?Date of Birth: 28-Feb-1961 ? ?Transition of Care (TOC) CM/SW Contact:  ?Karn Cassis, LCSW ?Phone Number: ?08/28/2021, 2:44 PM ? ? ?Clinical Narrative:  Pt d/c today. Offered [facility] for wound care and pt agreeable with no preference on agency. Referred and accepted by Sarah with [facility]. Pt, RN, and MD updated. Order in.   ? ? ? ?Final next level of care: Home w Home Health Services ?Barriers to Discharge: Barriers Resolved ? ? ?Patient Goals and CMS Choice ?Patient states their goals for this hospitalization and ongoing recovery are:: return home ?  ?Choice offered to / list presented to : Patient ? ?Discharge Placement ?  ?           ?  ?  ?Name of family member notified: pt only ?Patient and family notified of of transfer: 08/28/21 ? ?Discharge Plan and Services ?  ?  ?           ?  ?  ?  ?  ?  ?HH Arranged: RN ?HH Agency: [facility] ?Date HH Agency Contacted: 08/28/21 ?Time HH Agency Contacted: 1444 ?Representative spoke with at [facility] Agency: Maralyn Sago ? ?Social Determinants of Health (SDOH) Interventions ?  ? ? ?Readmission Risk Interventions ? ?  10/18/2019  ? 11:58 AM  ?Readmission Risk Prevention Plan  ?Transportation Screening Complete  ?PCP or Specialist Appt within 3-5 Days Complete  ?HRI or Home Care Consult Complete  ?Social Work Consult for Recovery Care Planning/Counseling Complete  ?Palliative Care Screening Not Applicable  ?Medication Review Oceanographer) Complete  ? ? ? ? ? ?

## 2021-08-28 NOTE — Discharge Instructions (Signed)
Wound Care Instructions:  ?Instructions: ?Cleanse area daily with saline once the dressing is removed. ?Pat dry with gauze, gently ?Cover with silvadene cream ?Cover this with 4 xeroform gauze to cover the sites ?Wrap with the kerlix. ?Wrap with ACE wrap. ? ?Discharge with supplies: ?30 Xeroform gauze (large) ?Kerlix X 8 ?ACE wrap (1 more extra); he can wash the two he has now by hand ?Bottle saline ?Multiple packs of gauze  ?Home with large bottle of the silvadene in room.  ? ?Will see 5/25 for a wound check, office will call. ?Continue suboxone and PRN ibuprofen and tylenlol for pain. ?Elevate leg when not ambulating. ?Continue other home meds, Xarelto. ? ? ?
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
## 2021-08-28 NOTE — Care Management Obs Status (Signed)
MEDICARE OBSERVATION STATUS NOTIFICATION ? ? ?Patient Details  ?Name: [name] ?MRN: [id number] ?Date of Birth: 1961-02-04 ? ? ?Medicare Observation Status Notification Given:  Yes ? ? ? ?Karn Cassis, LCSW ?08/28/2021, 3:19 PM ?

## 2021-08-28 NOTE — Progress Notes (Addendum)
[facility] ? ?Doing fair. No major pain except when doing the dressing change. Took narcotics once last night. Understands limitations given the suboxone. Does not want to take any narcotics.  ? ?Minimal bleeding with the dressing change. Silvadene and xeroform, kerlix and gauze replaced. ? ?Instructions: ?Cleanse area daily with saline once the dressing is removed. ?Pat dry with gauze, gently ?Cover with silvadene cream ?Cover this with 4 xeroform gauze to cover the sites ?Wrap with the kerlix. ?Wrap with ACE wrap. ? ?Discharge with supplies: ?30 Xeroform gauze (large) ?Kerlix X 8 ?ACE wrap (1 more extra); he can wash the two he has now by hand ?Bottle saline ?Multiple packs of gauze  ?Home with large bottle of the silvadene in room.  ? ?Will see 5/25 for a wound check, office will call. ?Continue suboxone and PRN ibuprofen and tylenlol for pain. ?Elevate leg when not ambulating. ?Continue other home meds, Xarelto, etc  ? ? ?Algis Greenhouse, MD ?[facility] ?[address] [place] ?[place], [place] [address] ?[phone] (office) ? ?

## 2021-08-30 ENCOUNTER — Ambulatory Visit (HOSPITAL_COMMUNITY)
Admission: RE | Admit: 2021-08-30 | Discharge: 2021-08-30 | Disposition: A | Discharge status: Home / Self Care | Payer: Medicare Other | Source: Ambulatory Visit | Attending: Gastroenterology | Admitting: Gastroenterology

## 2021-08-30 DIAGNOSIS — B182 Chronic viral hepatitis C: Secondary | ICD-10-CM | POA: Diagnosis present

## 2021-08-30 DIAGNOSIS — K7460 Unspecified cirrhosis of liver: Secondary | ICD-10-CM | POA: Insufficient documentation

## 2021-09-02 ENCOUNTER — Other Ambulatory Visit (HOSPITAL_COMMUNITY): Payer: Self-pay | Admitting: Specialist

## 2021-09-02 DIAGNOSIS — J690 Pneumonitis due to inhalation of food and vomit: Secondary | ICD-10-CM

## 2021-09-02 DIAGNOSIS — R1312 Dysphagia, oropharyngeal phase: Secondary | ICD-10-CM

## 2021-09-04 ENCOUNTER — Telehealth: Payer: Self-pay | Admitting: *Deleted

## 2021-09-04 ENCOUNTER — Encounter: Payer: Self-pay | Admitting: *Deleted

## 2021-09-04 NOTE — Telephone Encounter (Signed)
Tried several times to reach pt. Mailed a letter.  

## 2021-09-05 ENCOUNTER — Encounter: Payer: Self-pay | Admitting: General Surgery

## 2021-09-05 ENCOUNTER — Ambulatory Visit (INDEPENDENT_AMBULATORY_CARE_PROVIDER_SITE_OTHER): Payer: Medicare Other | Admitting: General Surgery

## 2021-09-05 VITALS — BP 101/67 | HR 72 | Temp 98.0°F | Resp 14 | Ht 75.0 in | Wt 201.0 lb

## 2021-09-05 DIAGNOSIS — T24202S Burn of second degree of unspecified site of left lower limb, except ankle and foot, sequela: Secondary | ICD-10-CM

## 2021-09-05 MED ORDER — SILVER SULFADIAZINE 1 % EX CREA
TOPICAL_CREAM | Freq: Every day | CUTANEOUS | 0 refills | Status: DC
Start: 2021-09-05 — End: 2022-01-08

## 2021-09-05 NOTE — Patient Instructions (Signed)
Continue your wound care with silvadene and xeroform, wrap with gauze and ace wrap.
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
## 2021-09-12 ENCOUNTER — Encounter: Payer: Self-pay | Admitting: General Surgery

## 2021-09-12 ENCOUNTER — Ambulatory Visit (INDEPENDENT_AMBULATORY_CARE_PROVIDER_SITE_OTHER): Payer: Medicare Other | Admitting: General Surgery

## 2021-09-12 VITALS — BP 111/74 | HR 62 | Temp 97.7°F | Resp 14 | Ht 75.0 in | Wt 200.0 lb

## 2021-09-12 DIAGNOSIS — T24202S Burn of second degree of unspecified site of left lower limb, except ankle and foot, sequela: Secondary | ICD-10-CM

## 2021-09-12 NOTE — Patient Instructions (Signed)
Apply the silvadene to the leg if needed in the deeper spots.  Otherwise just be gentle to the area.
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
## 2021-09-13 ENCOUNTER — Ambulatory Visit (HOSPITAL_COMMUNITY): Admission: RE | Admit: 2021-09-13 | Payer: Medicare Other | Source: Ambulatory Visit

## 2021-09-19 ENCOUNTER — Other Ambulatory Visit (HOSPITAL_COMMUNITY): Payer: Medicare Other

## 2021-09-19 ENCOUNTER — Ambulatory Visit (HOSPITAL_COMMUNITY): Payer: Medicare Other | Attending: Gastroenterology | Admitting: Speech Pathology

## 2021-10-01 ENCOUNTER — Other Ambulatory Visit (HOSPITAL_COMMUNITY): Payer: Self-pay | Admitting: Nurse Practitioner

## 2021-10-01 ENCOUNTER — Ambulatory Visit (HOSPITAL_COMMUNITY)
Admission: RE | Admit: 2021-10-01 | Discharge: 2021-10-01 | Disposition: A | Discharge status: Home / Self Care | Payer: Medicare Other | Source: Ambulatory Visit | Attending: Nurse Practitioner | Admitting: Nurse Practitioner

## 2021-10-01 DIAGNOSIS — R059 Cough, unspecified: Secondary | ICD-10-CM | POA: Insufficient documentation

## 2021-10-08 ENCOUNTER — Encounter: Payer: Self-pay | Admitting: Internal Medicine

## 2021-12-04 ENCOUNTER — Ambulatory Visit: Payer: Medicare Other | Admitting: Internal Medicine

## 2022-01-01 ENCOUNTER — Ambulatory Visit: Payer: Medicare Other | Admitting: Internal Medicine

## 2022-01-08 ENCOUNTER — Ambulatory Visit (INDEPENDENT_AMBULATORY_CARE_PROVIDER_SITE_OTHER): Payer: Medicare Other | Admitting: General Surgery

## 2022-01-08 ENCOUNTER — Encounter: Payer: Self-pay | Admitting: General Surgery

## 2022-01-08 VITALS — BP 115/78 | HR 72 | Temp 98.0°F | Resp 14 | Ht 75.0 in | Wt 190.0 lb

## 2022-01-08 DIAGNOSIS — R1032 Left lower quadrant pain: Secondary | ICD-10-CM | POA: Diagnosis not present

## 2022-01-08 NOTE — Patient Instructions (Signed)
Dr. Eula Listen, GI physician about your hiatal hernia
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
## 2022-05-07 ENCOUNTER — Ambulatory Visit
Admission: EM | Admit: 2022-05-07 | Discharge: 2022-05-07 | Disposition: A | Discharge status: Home / Self Care | Payer: Medicare HMO

## 2022-05-07 DIAGNOSIS — R310 Gross hematuria: Secondary | ICD-10-CM

## 2022-05-07 DIAGNOSIS — M25562 Pain in left knee: Secondary | ICD-10-CM | POA: Diagnosis not present

## 2022-05-07 LAB — POCT URINALYSIS DIP (MANUAL ENTRY)
Bilirubin, UA: NEGATIVE
Glucose, UA: NEGATIVE mg/dL
Ketones, POC UA: NEGATIVE mg/dL
Leukocytes, UA: NEGATIVE
Nitrite, UA: NEGATIVE
Protein Ur, POC: NEGATIVE mg/dL
Spec Grav, UA: 1.025 (ref 1.010–1.025)
Urobilinogen, UA: 1 E.U./dL
pH, UA: 6 (ref 5.0–8.0)

## 2022-05-07 MED ORDER — METHYLPREDNISOLONE SODIUM SUCC 125 MG IJ SOLR
60.0000 mg | Freq: Once | INTRAMUSCULAR | Status: AC
  Administered 2022-05-07: 60 mg via INTRAMUSCULAR

## 2022-05-07 MED ORDER — PREDNISONE 20 MG PO TABS: ORAL_TABLET | ORAL | 0 refills | Status: DC
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
## 2022-05-07 NOTE — Discharge Instructions (Addendum)
I think the pain and swelling in your knee may be coming from gout.  We have checked the uric acid level today and will call you tomorrow if this is abnormal.  We have given you an injection of steroid today to help with the pain and swelling.  Start the oral prednisone tomorrow.  Wear the ACE wrap if you can tolerate it for most of the day to help with pain/swelling.  Follow up with an Orthopedic provider if the knee pain does not improve with treatment.   The urinalysis today shows blood in the urine.  There are no signs of infection today.  I think this is most likely coming from the [place], but I would recommend follow up with a Urologist to have them check everything.  Please keep taking the Xarelto in the meantime.  Call the Urologist ( I have provided contact info) to schedule an appointment.

## 2022-05-07 NOTE — ED Triage Notes (Signed)
Pt reports left knee pain and swelling x 1 week.   Reports blood in urine x 2 weeks. Pt is concern as he takes Xarelto and he has protein S deficiency.

## 2022-05-08 LAB — URIC ACID: Uric Acid: 3.3 mg/dL — ABNORMAL LOW (ref 3.8–8.4)

## 2022-05-22 ENCOUNTER — Encounter: Payer: Self-pay | Admitting: Orthopedic Surgery

## 2022-06-04 ENCOUNTER — Encounter (HOSPITAL_COMMUNITY): Payer: Self-pay | Admitting: *Deleted

## 2022-06-04 ENCOUNTER — Other Ambulatory Visit: Payer: Self-pay

## 2022-06-04 ENCOUNTER — Emergency Department (HOSPITAL_COMMUNITY)
Admission: EM | Admit: 2022-06-04 | Discharge: 2022-06-04 | Discharge status: Against Medical Advice | Payer: Medicare HMO | Attending: Emergency Medicine | Admitting: Emergency Medicine

## 2022-06-04 DIAGNOSIS — L02414 Cutaneous abscess of left upper limb: Secondary | ICD-10-CM | POA: Insufficient documentation

## 2022-06-04 DIAGNOSIS — R110 Nausea: Secondary | ICD-10-CM | POA: Insufficient documentation

## 2022-06-04 DIAGNOSIS — Z5321 Procedure and treatment not carried out due to patient leaving prior to being seen by health care provider: Secondary | ICD-10-CM | POA: Diagnosis not present

## 2022-06-04 NOTE — ED Triage Notes (Signed)
Pt with abscess on left knee per pt 3 months ago, seen at [facility] and was told it was gout and then seen PCP and was told it wasn't gout.  Pt with new abscess left elbow for couple of weeks and pt states he drained it himself.  New one to buttocks since yesterday. +nausea

## 2022-06-07 ENCOUNTER — Emergency Department (HOSPITAL_COMMUNITY)
Admission: EM | Admit: 2022-06-07 | Discharge: 2022-06-07 | Disposition: A | Discharge status: Home / Self Care | Payer: Medicare HMO | Attending: Emergency Medicine | Admitting: Emergency Medicine

## 2022-06-07 ENCOUNTER — Emergency Department (HOSPITAL_COMMUNITY): Payer: Medicare HMO

## 2022-06-07 ENCOUNTER — Encounter (HOSPITAL_COMMUNITY): Payer: Self-pay | Admitting: *Deleted

## 2022-06-07 ENCOUNTER — Other Ambulatory Visit: Payer: Self-pay

## 2022-06-07 DIAGNOSIS — Z23 Encounter for immunization: Secondary | ICD-10-CM | POA: Diagnosis not present

## 2022-06-07 DIAGNOSIS — L02414 Cutaneous abscess of left upper limb: Secondary | ICD-10-CM | POA: Diagnosis present

## 2022-06-07 DIAGNOSIS — J441 Chronic obstructive pulmonary disease with (acute) exacerbation: Secondary | ICD-10-CM | POA: Diagnosis not present

## 2022-06-07 DIAGNOSIS — M7052 Other bursitis of knee, left knee: Secondary | ICD-10-CM

## 2022-06-07 DIAGNOSIS — Z87891 Personal history of nicotine dependence: Secondary | ICD-10-CM | POA: Insufficient documentation

## 2022-06-07 DIAGNOSIS — Y939 Activity, unspecified: Secondary | ICD-10-CM | POA: Diagnosis not present

## 2022-06-07 DIAGNOSIS — Z7901 Long term (current) use of anticoagulants: Secondary | ICD-10-CM | POA: Diagnosis not present

## 2022-06-07 DIAGNOSIS — M7042 Prepatellar bursitis, left knee: Secondary | ICD-10-CM | POA: Diagnosis not present

## 2022-06-07 DIAGNOSIS — L03114 Cellulitis of left upper limb: Secondary | ICD-10-CM | POA: Diagnosis not present

## 2022-06-07 DIAGNOSIS — L0291 Cutaneous abscess, unspecified: Secondary | ICD-10-CM

## 2022-06-07 LAB — CBC WITH DIFFERENTIAL/PLATELET
Abs Immature Granulocytes: 0.02 10*3/uL (ref 0.00–0.07)
Basophils Absolute: 0.1 10*3/uL (ref 0.0–0.1)
Basophils Relative: 1 %
Eosinophils Absolute: 0.3 10*3/uL (ref 0.0–0.5)
Eosinophils Relative: 3 %
HCT: 41 % (ref 39.0–52.0)
Hemoglobin: 13.5 g/dL (ref 13.0–17.0)
Immature Granulocytes: 0 %
Lymphocytes Relative: 23 %
Lymphs Abs: 1.9 10*3/uL (ref 0.7–4.0)
MCH: 30.5 pg (ref 26.0–34.0)
MCHC: 32.9 g/dL (ref 30.0–36.0)
MCV: 92.8 fL (ref 80.0–100.0)
Monocytes Absolute: 1 10*3/uL (ref 0.1–1.0)
Monocytes Relative: 12 %
Neutro Abs: 4.8 10*3/uL (ref 1.7–7.7)
Neutrophils Relative %: 61 %
Platelets: 237 10*3/uL (ref 150–400)
RBC: 4.42 MIL/uL (ref 4.22–5.81)
RDW: 12.2 % (ref 11.5–15.5)
WBC: 8 10*3/uL (ref 4.0–10.5)
nRBC: 0 % (ref 0.0–0.2)

## 2022-06-07 LAB — COMPREHENSIVE METABOLIC PANEL
ALT: 15 U/L (ref 0–44)
AST: 16 U/L (ref 15–41)
Albumin: 3.8 g/dL (ref 3.5–5.0)
Alkaline Phosphatase: 73 U/L (ref 38–126)
Anion gap: 6 (ref 5–15)
BUN: 23 mg/dL — ABNORMAL HIGH (ref 6–20)
CO2: 26 mmol/L (ref 22–32)
Calcium: 8.7 mg/dL — ABNORMAL LOW (ref 8.9–10.3)
Chloride: 101 mmol/L (ref 98–111)
Creatinine, Ser: 0.73 mg/dL (ref 0.61–1.24)
GFR, Estimated: >60 mL/min (ref >60)
Glucose, Bld: 95 mg/dL (ref 70–99)
Potassium: 4.8 mmol/L (ref 3.5–5.1)
Sodium: 133 mmol/L — ABNORMAL LOW (ref 135–145)
Total Bilirubin: 0.7 mg/dL (ref 0.3–1.2)
Total Protein: 6.9 g/dL (ref 6.5–8.1)

## 2022-06-07 LAB — LACTIC ACID, PLASMA: Lactic Acid, Venous: 0.6 mmol/L (ref 0.5–1.9)

## 2022-06-07 MED ORDER — TETANUS-DIPHTH-ACELL PERTUSSIS 5-2.5-18.5 LF-MCG/0.5 IM SUSY
0.5000 mL | PREFILLED_SYRINGE | Freq: Once | INTRAMUSCULAR | Status: AC
Start: 2022-06-07 — End: 2022-06-07
  Administered 2022-06-07: 0.5 mL via INTRAMUSCULAR
  Filled 2022-06-07: qty 0.5

## 2022-06-07 MED ORDER — DOXYCYCLINE HYCLATE 100 MG PO CAPS: 100.0000 mg | ORAL_CAPSULE | Freq: Two times a day (BID) | ORAL | 0 refills | Status: DC

## 2022-06-07 MED ORDER — KETOROLAC TROMETHAMINE 15 MG/ML IJ SOLN
15.0000 mg | Freq: Once | INTRAMUSCULAR | Status: AC
  Administered 2022-06-07: 15 mg via INTRAVENOUS
  Filled 2022-06-07: qty 1

## 2022-06-07 MED ORDER — CEFAZOLIN SODIUM 1 G IJ SOLR
1.0000 g | Freq: Once | INTRAMUSCULAR | Status: AC
  Administered 2022-06-07: 1 g via INTRAMUSCULAR
  Filled 2022-06-07: qty 10

## 2022-06-07 MED ORDER — PREDNISONE 20 MG PO TABS: 40.0000 mg | ORAL_TABLET | Freq: Every day | ORAL | 0 refills | Status: AC

## 2022-06-07 MED ORDER — CEPHALEXIN 500 MG PO CAPS: 500.0000 mg | ORAL_CAPSULE | Freq: Four times a day (QID) | ORAL | 0 refills | Status: DC

## 2022-06-07 MED ORDER — ALBUTEROL SULFATE HFA 108 (90 BASE) MCG/ACT IN AERS: 1.0000 | INHALATION_SPRAY | Freq: Four times a day (QID) | RESPIRATORY_TRACT | 0 refills | Status: AC | PRN

## 2022-06-07 MED ORDER — DOXYCYCLINE HYCLATE 100 MG PO TABS
100.0000 mg | ORAL_TABLET | Freq: Once | ORAL | Status: AC
  Administered 2022-06-07: 100 mg via ORAL
  Filled 2022-06-07: qty 1

## 2022-06-07 MED ORDER — SODIUM CHLORIDE 0.9 % IV BOLUS
1000.0000 mL | Freq: Once | INTRAVENOUS | Status: AC
  Administered 2022-06-07: 1000 mL via INTRAVENOUS

## 2022-06-07 MED ORDER — LIDOCAINE-EPINEPHRINE (PF) 1 %-1:200000 IJ SOLN
10.0000 mL | Freq: Once | INTRAMUSCULAR | Status: AC
  Administered 2022-06-07: 10 mL
  Filled 2022-06-07: qty 30

## 2022-06-07 NOTE — Discharge Instructions (Addendum)
Please follow-up with your primary care doctor, and take the antibiotics as prescribed.  Today you are found to have a COPD exacerbation as well as cellulitis and an abscess.  If your redness, swelling of your arm worsens, or you develop fever, chills please return to the ER.  If you start having severe shortness of breath, chest discomfort please return to the ER.

## 2022-06-07 NOTE — ED Provider Notes (Signed)
[facility] Provider Note   CSN: [id number] Arrival date & time: 06/07/22  1304     History  Chief Complaint  Patient presents with   Abscess    [name] is a 62 y.o. male, history of opioid dependence, who presents to the ED secondary to several complaints, when he states he has been more short of breath and having a productive cough, history of tobacco abuse per patient.  Denies any fevers, chills.   Additionally main complaint is left upper arm redness, swelling and drainage.  States that he popped a hole in a pustule on his arm, and it has been draining since then.  Since then its gotten more infected, and red and swollen.  Unknown last tetanus.  Additionally states that he has a lesion on his buttocks, that he wants to be looked at, states it has been red and he tried to pop it, and is very painful.  Lastly patient complains of left knee swelling, that is been going on for a while.  He states there is a knob on his left knee and it has been tender.  His primary care told him he has gout but he does not believe it.    Home Medications Prior to Admission medications   Medication Sig Start Date End Date Taking? Authorizing Provider  albuterol (VENTOLIN HFA) 108 (90 Base) MCG/ACT inhaler Inhale 1-2 puffs into the lungs every 6 (six) hours as needed for wheezing or shortness of breath. 06/07/22  Yes Nikki Rusnak L, PA  cephALEXin (KEFLEX) 500 MG capsule Take 1 capsule (500 mg total) by mouth 4 (four) times daily. 06/07/22  Yes Josef Tourigny L, PA  doxycycline (VIBRAMYCIN) 100 MG capsule Take 1 capsule (100 mg total) by mouth 2 (two) times daily. 06/07/22  Yes Quida Glasser L, PA  predniSONE (DELTASONE) 20 MG tablet Take 2 tablets (40 mg total) by mouth daily for 5 days. 06/07/22 06/12/22 Yes Shaquan Puerta L, PA  ALPRAZolam (XANAX) 0.5 MG tablet Take 0.5 mg by mouth 3 (three) times daily. 05/01/21   [provider]  atorvastatin  (LIPITOR) 10 MG tablet Take 1 tablet by mouth daily. 06/11/21   [provider]  meclizine (ANTIVERT) 25 MG tablet Take 25 mg by mouth 3 (three) times daily. 05/06/22   [provider]  ondansetron (ZOFRAN-ODT) 8 MG disintegrating tablet Take 8 mg by mouth every 6 (six) hours. 01/21/22   [provider]  rivaroxaban (XARELTO) 20 MG TABS tablet Take 20 mg by mouth daily.    [provider]  triamterene-hydrochlorothiazide (MAXZIDE-25) 37.5-25 MG tablet Take 0.5 tablets by mouth daily. 05/06/22   [provider]      Allergies    Patient has no known allergies.    Review of Systems   Review of Systems  Respiratory:  Positive for cough and shortness of breath.   Cardiovascular:  Negative for chest pain.  Skin:  Positive for rash and wound.    Physical Exam Updated Vital Signs BP 116/78   Pulse 77   Temp 97.8 F (36.6 C)   Resp 18   Ht '6\' 3"'$  (1.905 m)   Wt 89.4 kg   SpO2 98%   BMI 24.62 kg/m  Physical Exam Vitals and nursing note reviewed.  Constitutional:      General: He is not in acute distress.    Appearance: He is well-developed.  HENT:     Head: Normocephalic and atraumatic.  Eyes:     Conjunctiva/sclera: Conjunctivae normal.  Cardiovascular:     Rate and Rhythm: Normal rate and regular rhythm.     Heart sounds: No murmur heard. Pulmonary:     Effort: Pulmonary effort is normal. No respiratory distress.     Comments: +coarse breath sounds throughout Abdominal:     Palpations: Abdomen is soft.     Tenderness: There is no abdominal tenderness.  Musculoskeletal:        General: No swelling.     Cervical back: Neck supple.  Skin:    General: Skin is warm and dry.     Capillary Refill: Capillary refill takes less than 2 seconds.     Comments: +erythematous patch w/mild erythema and central lesion w/purulent discharge and fluctuance on left lower arm, +excoriation on R buttocks, +nodule inferior to L knee  Neurological:      Mental Status: He is alert.  Psychiatric:        Mood and Affect: Mood normal.     ED Results / Procedures / Treatments   Labs (all labs ordered are listed, but only abnormal results are displayed) Labs Reviewed  COMPREHENSIVE METABOLIC PANEL - Abnormal; Notable for the following components:      Result Value   Sodium 133 (*)    BUN 23 (*)    Calcium 8.7 (*)    All other components within normal limits  LACTIC ACID, PLASMA  CBC WITH DIFFERENTIAL/PLATELET    EKG None  Radiology DG Chest Port 1 View  Result Date: 06/07/2022 CLINICAL DATA:  SOB EXAM: PORTABLE CHEST 1 VIEW COMPARISON:  12/09/2019 and 10/01/2021. FINDINGS: Linear subsegmental atelectasis or scarring upper lobes and left base. No pneumonia or pulmonary edema. No pneumothorax or pleural effusion. Unremarkable cardiac silhouette. IMPRESSION: Bilateral linear subsegmental atelectasis or scarring. Otherwise no acute cardiopulmonary process. Electronically Signed   By: Sammie Bench M.D.   On: 06/07/2022 16:08   DG Knee Complete 4 Views Left  Result Date: 06/07/2022 CLINICAL DATA:  Knee pain.  Bump.  Bony air-fluid? EXAM: LEFT KNEE - COMPLETE 4+ VIEW COMPARISON:  Left tibia and fibula radiographs 08/27/2021 FINDINGS: Mild chronic enthesopathic change at the quadriceps insertion on the patella. Mild medial compartment joint space narrowing. Minimal tricompartmental peripheral degenerative osteophytes. There is also focal enlargement/oval soft tissue swelling seen just anterior to the tibial tubercle. Some of this was present previously on 08/27/2021 but appears increased from prior. This region measures up to approximately 1.6 cm in AP dimension and 5.0 cm in craniocaudal dimension compared to 1.2 cm in AP dimension and 3.5 cm in craniocaudal dimension previously. No joint effusion.  No acute fracture or dislocation. IMPRESSION: 1. Mild radiocarpal osteoarthritis. 2. Mild chronic enthesopathic change at the quadriceps insertion  on the patella. 3. The patient's bump appears to correspond to anterior bulging at the region of the superficial infrapatellar bursa. This is mildly worsened from 08/27/2021. This may represent chronic superficial infrapatellar bursitis (housemaid's knee/clergyman's knee). Electronically Signed   By: Yvonne Kendall M.D.   On: 06/07/2022 15:11    Procedures .Marland KitchenIncision and Drainage  Date/Time: 06/07/2022 4:39 PM  Performed by: Osvaldo Shipper, PA Authorized by: Osvaldo Shipper, PA   Consent:    Consent obtained:  Verbal   Consent given by:  Patient   Risks discussed:  Bleeding, incomplete drainage, pain, infection and damage to other organs   Alternatives discussed:  Alternative treatment Universal protocol:    Patient identity confirmed:  Verbally with patient Location:  Type:  Abscess   Size:  2x2cm   Location:  Upper extremity   Upper extremity location:  Arm   Arm location:  L lower arm Pre-procedure details:    Skin preparation:  Chlorhexidine with alcohol Sedation:    Sedation type:  None Anesthesia:    Anesthesia method:  Local infiltration   Local anesthetic:  Lidocaine 1% WITH epi Procedure type:    Complexity:  Simple Procedure details:    Ultrasound guidance: no     Needle aspiration: no     Incision types:  Stab incision   Drainage:  Purulent   Drainage amount:  Scant   Wound treatment:  Wound left open   Packing materials:  None Post-procedure details:    Procedure completion:  Tolerated     Medications Ordered in ED Medications  Tdap (BOOSTRIX) injection 0.5 mL (has no administration in time range)  ketorolac (TORADOL) 15 MG/ML injection 15 mg (15 mg Intravenous Given 06/07/22 1603)  sodium chloride 0.9 % bolus 1,000 mL (1,000 mLs Intravenous New Bag/Given 06/07/22 1605)  ceFAZolin (ANCEF) injection 1 g (1 g Intramuscular Given 06/07/22 1615)  doxycycline (VIBRA-TABS) tablet 100 mg (100 mg Oral Given 06/07/22 1603)  lidocaine-EPINEPHrine (PF)  (XYLOCAINE-EPINEPHrine) 1 %-1:200000 (PF) injection 10 mL (10 mLs Infiltration Given 06/07/22 1604)    ED Course/ Medical Decision Making/ A&P                             Medical Decision Making Patient is a 62 year old male, here for multiple complaints including skin complaints, and shortness of breath.  Will obtain a chest x-ray as he is coarse throughout and basic labs to check for leukocytosis, elevated lactic acid from redness swelling of his arm secondary to cellulitis.  Amount and/or Complexity of Data Reviewed Labs: ordered.    Details: Unremarkable no leukocytosis Radiology: ordered.    Details: Significant scarring per x-ray Discussion of management or test interpretation with external provider(s): Discussed with GYN, shortness of breath aligns with COPD exacerbation due to the increased sputum production, dyspnea, coarse lung sounds.  Will start on prednisone, doxycycline, and albuterol inhaler.  Additionally I indeed the abscess on his left arm, with some drainage.  We discussed I&D home care, and he was placed on Keflex along with the doxycycline for cellulitis.  X-ray was obtained for the knee lesion, which aligns with bursitis, I discussed is conservative care and follow-up with primary care doctor.  As well as the lesion on his buttocks was likely an inflamed/infected hair follicle that he popped.  His tetanus was updated as patient attempted to lance the abscess himself.  Risk Prescription drug management.   Final Clinical Impression(s) / ED Diagnoses Final diagnoses:  COPD exacerbation ([place])  Cellulitis of left upper extremity  Abscess  Infrapatellar bursitis of left knee    Rx / DC Orders ED Discharge Orders          Ordered    cephALEXin (KEFLEX) 500 MG capsule  4 times daily        06/07/22 1649    doxycycline (VIBRAMYCIN) 100 MG capsule  2 times daily        06/07/22 1649    predniSONE (DELTASONE) 20 MG tablet  Daily        06/07/22 1649    albuterol  (VENTOLIN HFA) 108 (90 Base) MCG/ACT inhaler  Every 6 hours PRN        06/07/22 1649  Osvaldo Shipper, [place] 06/07/22 1730    [place] Ferguson, MD 06/10/22 1539

## 2022-06-07 NOTE — ED Triage Notes (Signed)
Returns for recurrent abscess to L knee and L elbow. Denies fever. Alert, NAD, calm, interactive. Pt reports self I&D w/o success.

## 2022-06-14 ENCOUNTER — Other Ambulatory Visit: Payer: Self-pay

## 2022-06-14 ENCOUNTER — Encounter (HOSPITAL_COMMUNITY): Payer: Self-pay

## 2022-06-14 ENCOUNTER — Emergency Department (HOSPITAL_COMMUNITY)
Admission: EM | Admit: 2022-06-14 | Discharge: 2022-06-14 | Discharge status: Against Medical Advice | Payer: Medicare HMO | Attending: Emergency Medicine | Admitting: Emergency Medicine

## 2022-06-14 DIAGNOSIS — F0390 Unspecified dementia without behavioral disturbance: Secondary | ICD-10-CM | POA: Insufficient documentation

## 2022-06-14 DIAGNOSIS — Z7901 Long term (current) use of anticoagulants: Secondary | ICD-10-CM | POA: Diagnosis not present

## 2022-06-14 DIAGNOSIS — M7052 Other bursitis of knee, left knee: Secondary | ICD-10-CM | POA: Diagnosis not present

## 2022-06-14 DIAGNOSIS — Z5329 Procedure and treatment not carried out because of patient's decision for other reasons: Secondary | ICD-10-CM | POA: Diagnosis not present

## 2022-06-14 DIAGNOSIS — Y9389 Activity, other specified: Secondary | ICD-10-CM | POA: Insufficient documentation

## 2022-06-14 DIAGNOSIS — M7989 Other specified soft tissue disorders: Secondary | ICD-10-CM | POA: Diagnosis present

## 2022-06-14 NOTE — ED Triage Notes (Signed)
States he was seen here the other day for 2 abscesses.  One on left forearm & left knee.  He states the antibiotics aren't working, he is in extreme pain, hasn't seen any improvements with medication.  States he is now having lower back & difficulty walking.

## 2022-06-14 NOTE — ED Notes (Signed)
Pt with increased pain and swelling to LLE after abx. Pt also complains of unchanged healing to infection in L arm. Pt at this time wanting to leave due to his father having dementia and needing to care for him. PA back to the bedside.

## 2022-06-14 NOTE — ED Provider Notes (Signed)
[facility] Provider Note   CSN: [id number] Arrival date & time: 06/14/22  1249     History  Chief Complaint  Patient presents with   Abscess    [name] is a 62 y.o. male.  Patient presents with a chief complaint of left anterior leg swelling just distal to the knee.  Patient states this has been in place for the past week or longer.  He was evaluated in the emergency department on 24 February and had incision and drainage of an abscess in the left arm.  At the time there was no indication for drainage of the knee.  He states that that he has continued to swell and is exquisitely painful.  He does have follow-up with orthopedics next week for evaluation of the knee.  Patient denies any systemic symptoms such as nausea, vomiting, fever.  Past medical history significant for protein S deficiency, opioid dependence, GERD, PE history, hepatitis B infection  HPI     Home Medications Prior to Admission medications   Medication Sig Start Date End Date Taking? Authorizing Provider  albuterol (VENTOLIN HFA) 108 (90 Base) MCG/ACT inhaler Inhale 1-2 puffs into the lungs every 6 (six) hours as needed for wheezing or shortness of breath. 06/07/22   Small, Brooke L, PA  ALPRAZolam (XANAX) 0.5 MG tablet Take 0.5 mg by mouth 3 (three) times daily. 05/01/21   [provider]  atorvastatin (LIPITOR) 10 MG tablet Take 1 tablet by mouth daily. 06/11/21   [provider]  cephALEXin (KEFLEX) 500 MG capsule Take 1 capsule (500 mg total) by mouth 4 (four) times daily. 06/07/22   Small, Brooke L, PA  doxycycline (VIBRAMYCIN) 100 MG capsule Take 1 capsule (100 mg total) by mouth 2 (two) times daily. 06/07/22   Small, Brooke L, PA  meclizine (ANTIVERT) 25 MG tablet Take 25 mg by mouth 3 (three) times daily. 05/06/22   [provider]  ondansetron (ZOFRAN-ODT) 8 MG disintegrating tablet Take 8 mg by mouth every 6 (six) hours. 01/21/22    [provider]  rivaroxaban (XARELTO) 20 MG TABS tablet Take 20 mg by mouth daily.    [provider]  triamterene-hydrochlorothiazide (MAXZIDE-25) 37.5-25 MG tablet Take 0.5 tablets by mouth daily. 05/06/22   [provider]      Allergies    Patient has no known allergies.    Review of Systems   Review of Systems  Constitutional:  Negative for fever.  Gastrointestinal:  Negative for nausea and vomiting.  Musculoskeletal:  Positive for arthralgias.    Physical Exam Updated Vital Signs BP 118/76 (BP Location: Right Arm)   Pulse 80   Temp (!) 97.5 F (36.4 C) (Oral)   Resp 16   Ht '6\' 3"'$  (1.905 m)   Wt 90.7 kg   SpO2 99%   BMI 25.00 kg/m  Physical Exam HENT:     Head: Normocephalic and atraumatic.  Eyes:     Pupils: Pupils are equal, round, and reactive to light.  Pulmonary:     Effort: Pulmonary effort is normal. No respiratory distress.  Musculoskeletal:        General: Swelling and tenderness present. No signs of injury.     Cervical back: Normal range of motion.     Comments: Swollen knot distal to the left knee, just lateral of midline.  Area is tender with no erythema, drainage, warmth  Skin:    General: Skin is dry.  Findings: No erythema.  Neurological:     Mental Status: He is alert.  Psychiatric:        Speech: Speech normal.        Behavior: Behavior normal.     ED Results / Procedures / Treatments   Labs (all labs ordered are listed, but only abnormal results are displayed) Labs Reviewed - No data to display  EKG None  Radiology No results found.  Procedures Procedures    Medications Ordered in ED Medications - No data to display  ED Course/ Medical Decision Making/ A&P                             Medical Decision Making Amount and/or Complexity of Data Reviewed Radiology: ordered.   Patient presents with a chief complaint of a swollen area below the left knee.  Differential diagnosis includes but is  not limited to abscess, bursitis, fracture, others  The patient is comorbidities including chronic pain  I reviewed medical records from February 24 where the patient was evaluated due to the abscess on his left arm and what appeared to be cellulitis along with likely folliculitis on the buttock.  X-rays of the left knee at that time showed what they believed was corresponding to anterior bulging of the region of the superficial infrapatellar bursa, possibly due to chronic superficial infrapatellar bursitis  I ordered imaging including ultrasound of the left lower leg to evaluate for bursitis versus infection/abscess  Prior to the completion of the ultrasound the patient is that he had to leave due to having his father with him who suffers from dementia and he was having worsening mental condition.  Patient advised of previous x-ray results and advised that there was no further treatment at this time based on note and itching today.  The patient was advised to try NSAIDs including Voltaren gel until follow-up with orthopedics.  He was advised to return to the emergency department for further evaluation       Final Clinical Impression(s) / ED Diagnoses Final diagnoses:  Infrapatellar bursitis of left knee    Rx / DC Orders ED Discharge Orders     None         Ronny Bacon 06/14/22 1450    Fredia Sorrow, MD 06/15/22 [phone]

## 2022-06-17 ENCOUNTER — Ambulatory Visit: Payer: Medicare HMO | Admitting: Orthopedic Surgery

## 2022-06-25 ENCOUNTER — Other Ambulatory Visit: Payer: Self-pay

## 2022-06-25 ENCOUNTER — Emergency Department (HOSPITAL_COMMUNITY)
Admission: EM | Admit: 2022-06-25 | Discharge: 2022-06-25 | Discharge status: Against Medical Advice | Payer: Medicare HMO | Attending: Emergency Medicine | Admitting: Emergency Medicine

## 2022-06-25 ENCOUNTER — Encounter (HOSPITAL_COMMUNITY): Payer: Self-pay | Admitting: Emergency Medicine

## 2022-06-25 DIAGNOSIS — Z5329 Procedure and treatment not carried out because of patient's decision for other reasons: Secondary | ICD-10-CM | POA: Insufficient documentation

## 2022-06-25 DIAGNOSIS — R443 Hallucinations, unspecified: Secondary | ICD-10-CM | POA: Diagnosis present

## 2022-06-25 DIAGNOSIS — F1210 Cannabis abuse, uncomplicated: Secondary | ICD-10-CM | POA: Insufficient documentation

## 2022-06-25 DIAGNOSIS — F1510 Other stimulant abuse, uncomplicated: Secondary | ICD-10-CM | POA: Insufficient documentation

## 2022-06-25 DIAGNOSIS — F22 Delusional disorders: Secondary | ICD-10-CM | POA: Diagnosis not present

## 2022-06-25 DIAGNOSIS — F1310 Sedative, hypnotic or anxiolytic abuse, uncomplicated: Secondary | ICD-10-CM | POA: Diagnosis not present

## 2022-06-25 DIAGNOSIS — F1910 Other psychoactive substance abuse, uncomplicated: Secondary | ICD-10-CM

## 2022-06-25 DIAGNOSIS — F1110 Opioid abuse, uncomplicated: Secondary | ICD-10-CM | POA: Insufficient documentation

## 2022-06-25 HISTORY — DX: Schizophrenia, unspecified: F20.9

## 2022-06-25 LAB — CBC
HCT: 42.3 % (ref 39.0–52.0)
Hemoglobin: 14.3 g/dL (ref 13.0–17.0)
MCH: 30.4 pg (ref 26.0–34.0)
MCHC: 33.8 g/dL (ref 30.0–36.0)
MCV: 90 fL (ref 80.0–100.0)
Platelets: 182 10*3/uL (ref 150–400)
RBC: 4.7 MIL/uL (ref 4.22–5.81)
RDW: 12.5 % (ref 11.5–15.5)
WBC: 5.6 10*3/uL (ref 4.0–10.5)
nRBC: 0 % (ref 0.0–0.2)

## 2022-06-25 LAB — RAPID URINE DRUG SCREEN, HOSP PERFORMED
Amphetamines: POSITIVE — AB
Barbiturates: POSITIVE — AB
Benzodiazepines: POSITIVE — AB
Cocaine: NOT DETECTED
Opiates: POSITIVE — AB
Tetrahydrocannabinol: POSITIVE — AB

## 2022-06-25 LAB — COMPREHENSIVE METABOLIC PANEL
ALT: 25 U/L (ref 0–44)
AST: 20 U/L (ref 15–41)
Albumin: 4.1 g/dL (ref 3.5–5.0)
Alkaline Phosphatase: 67 U/L (ref 38–126)
Anion gap: 10 (ref 5–15)
BUN: 17 mg/dL (ref 6–20)
CO2: 23 mmol/L (ref 22–32)
Calcium: 9.2 mg/dL (ref 8.9–10.3)
Chloride: 103 mmol/L (ref 98–111)
Creatinine, Ser: 0.76 mg/dL (ref 0.61–1.24)
GFR, Estimated: >60 mL/min (ref >60)
Glucose, Bld: 106 mg/dL — ABNORMAL HIGH (ref 70–99)
Potassium: 4.2 mmol/L (ref 3.5–5.1)
Sodium: 136 mmol/L (ref 135–145)
Total Bilirubin: 0.5 mg/dL (ref 0.3–1.2)
Total Protein: 7.8 g/dL (ref 6.5–8.1)

## 2022-06-25 LAB — ETHANOL: Alcohol, Ethyl (B): <10 mg/dL (ref <10)

## 2022-06-25 NOTE — ED Notes (Signed)
Mother states pt kept her up all night long thinking someone was in house. Pt paranoid he is going back to jail.

## 2022-06-25 NOTE — ED Triage Notes (Addendum)
Pt here with mother. Mother states was really bad last night having avh. States someone was in house last night. Pt a/o at this time. And states he just feels paranoid all the time. Pt states only smokes week sometimes. States used adderal and suboxone 4 days ago without rx. Last used meth one week ago. Pt denies si/hi Pt c/o knot on left knee and left elbow x months. Elbow was lanced 2 weeks ago. States one on the knee is painful.

## 2022-06-25 NOTE — ED Provider Notes (Signed)
[facility] Provider Note   CSN: [id number] Arrival date & time: 06/25/22  0901     History  Chief Complaint  Patient presents with   Hallucinations   Paranoid    [name] is a 62 y.o. male.  Pt is a 62 yo male with pmhx significant for polysubstance abuse, bipolar d/o, and schizophrenia.  Pt said he's been very paranoid.  He was up all night thinking someone was in the house.  Pt denies si/hi.         Home Medications Prior to Admission medications   Medication Sig Start Date End Date Taking? Authorizing Provider  albuterol (VENTOLIN HFA) 108 (90 Base) MCG/ACT inhaler Inhale 1-2 puffs into the lungs every 6 (six) hours as needed for wheezing or shortness of breath. 06/07/22  Yes Small, Brooke L, PA  atorvastatin (LIPITOR) 10 MG tablet Take 1 tablet by mouth daily. 06/11/21  Yes [provider]  omeprazole (PRILOSEC) 40 MG capsule Take 40 mg by mouth daily. 04/21/22  Yes [provider]  rivaroxaban (XARELTO) 20 MG TABS tablet Take 20 mg by mouth daily.   Yes [provider]  triamterene-hydrochlorothiazide (MAXZIDE-25) 37.5-25 MG tablet Take 0.5 tablets by mouth daily. 05/06/22  Yes [provider]  cephALEXin (KEFLEX) 500 MG capsule Take 1 capsule (500 mg total) by mouth 4 (four) times daily. Patient not taking: Reported on 06/25/2022 06/07/22   Small, Brooke L, PA  doxycycline (VIBRAMYCIN) 100 MG capsule Take 1 capsule (100 mg total) by mouth 2 (two) times daily. Patient not taking: Reported on 06/25/2022 06/07/22   Small, Brooke L, PA  meclizine (ANTIVERT) 25 MG tablet Take 25 mg by mouth 3 (three) times daily. Patient not taking: Reported on 06/25/2022 05/06/22   [provider]  ondansetron (ZOFRAN-ODT) 8 MG disintegrating tablet Take 8 mg by mouth every 6 (six) hours. Patient not taking: Reported on 06/25/2022 01/21/22   [provider]      Allergies    Patient has no known  allergies.    Review of Systems   Review of Systems  Psychiatric/Behavioral:  Positive for hallucinations and sleep disturbance.   All other systems reviewed and are negative.   Physical Exam Updated Vital Signs BP (!) 142/101 (BP Location: Right Arm)   Pulse 81   Temp 98.2 F (36.8 C) (Oral)   Resp 17   SpO2 95%  Physical Exam Vitals and nursing note reviewed.  Constitutional:      Appearance: Normal appearance.  HENT:     Head: Normocephalic and atraumatic.     Right Ear: External ear normal.     Left Ear: External ear normal.     Nose: Nose normal.     Mouth/Throat:     Mouth: Mucous membranes are moist.     Pharynx: Oropharynx is clear.  Eyes:     Extraocular Movements: Extraocular movements intact.     Conjunctiva/sclera: Conjunctivae normal.     Pupils: Pupils are equal, round, and reactive to light.  Cardiovascular:     Rate and Rhythm: Normal rate and regular rhythm.     Pulses: Normal pulses.     Heart sounds: Normal heart sounds.  Pulmonary:     Effort: Pulmonary effort is normal.     Breath sounds: Normal breath sounds.  Abdominal:     General: Abdomen is flat. Bowel sounds are normal.     Palpations: Abdomen is soft.  Musculoskeletal:  General: Normal range of motion.     Cervical back: Normal range of motion and neck supple.  Skin:    General: Skin is warm.     Capillary Refill: Capillary refill takes less than 2 seconds.  Neurological:     General: No focal deficit present.     Mental Status: He is alert and oriented to person, place, and time.  Psychiatric:        Mood and Affect: Mood normal.        Behavior: Behavior normal.    ED Results / Procedures / Treatments   Labs (all labs ordered are listed, but only abnormal results are displayed) Labs Reviewed  COMPREHENSIVE METABOLIC PANEL - Abnormal; Notable for the following components:      Result Value   Glucose, Bld 106 (*)    All other components within normal limits  RAPID URINE  DRUG SCREEN, HOSP PERFORMED - Abnormal; Notable for the following components:   Opiates POSITIVE (*)    Benzodiazepines POSITIVE (*)    Amphetamines POSITIVE (*)    Tetrahydrocannabinol POSITIVE (*)    Barbiturates POSITIVE (*)    All other components within normal limits  ETHANOL  CBC    EKG None  Radiology No results found.  Procedures Procedures    Medications Ordered in ED Medications - No data to display  ED Course/ Medical Decision Making/ A&P                             Medical Decision Making Amount and/or Complexity of Data Reviewed Labs: ordered.   This patient presents to the ED for concern of paranoia, this involves an extensive number of treatment options, and is a complaint that carries with it a high risk of complications and morbidity.  The differential diagnosis includes drug abuse, psych d/o   Co morbidities that complicate the patient evaluation  polysubstance abuse, bipolar d/o, and schizophrenia   Additional history obtained:  Additional history obtained from epic chart review External records from outside source obtained and reviewed including father   Lab Tests:  I Ordered, and personally interpreted labs.  The pertinent results include:  cbc nl, cmp nl, etoh neg, uds +opiates, benzos, amphetamines, mj, and barbituates  Consultations Obtained:  I requested consultation with TTS,  and discussed lab and imaging findings as well as pertinent plan - pt left prior to completion   Problem List / ED Course:  Paranoia:  likely due to drug use.  However, pt said he has a hx of bipolar and schizophrenia and is not medicated.  TTS consult ordered.  Pt told nurse he did not want to wait any more.  He left before I could talk to him or give him resources.  Social Determinants of Health:  Lives with mom and dad   Dispostion:  After consideration of the diagnostic results and the patients response to treatment, I feel that the patent would  benefit from inpatient psych.          Final Clinical Impression(s) / ED Diagnoses Final diagnoses:  Polysubstance abuse ([place])  Paranoia ([place])    Rx / DC Orders ED Discharge Orders     None         Isla Pence, MD 06/25/22 [phone]

## 2022-06-25 NOTE — ED Notes (Signed)
Pt becoming agitated and anxious wanting to be discharged, Dr. Gilford Raid is aware.

## 2022-06-25 NOTE — ED Notes (Signed)
Pt unwilling to stay long enough to sign an AMA, Dr. Gilford Raid is aware.

## 2022-06-30 ENCOUNTER — Emergency Department (HOSPITAL_COMMUNITY): Payer: Medicare HMO

## 2022-06-30 ENCOUNTER — Other Ambulatory Visit: Payer: Self-pay

## 2022-06-30 ENCOUNTER — Emergency Department (HOSPITAL_COMMUNITY)
Admission: EM | Admit: 2022-06-30 | Discharge: 2022-06-30 | Disposition: A | Discharge status: Home / Self Care | Payer: Medicare HMO | Attending: Emergency Medicine | Admitting: Emergency Medicine

## 2022-06-30 DIAGNOSIS — J181 Lobar pneumonia, unspecified organism: Secondary | ICD-10-CM | POA: Insufficient documentation

## 2022-06-30 DIAGNOSIS — Z7951 Long term (current) use of inhaled steroids: Secondary | ICD-10-CM | POA: Diagnosis not present

## 2022-06-30 DIAGNOSIS — M791 Myalgia, unspecified site: Secondary | ICD-10-CM | POA: Diagnosis not present

## 2022-06-30 DIAGNOSIS — R079 Chest pain, unspecified: Secondary | ICD-10-CM | POA: Diagnosis present

## 2022-06-30 DIAGNOSIS — J45909 Unspecified asthma, uncomplicated: Secondary | ICD-10-CM | POA: Diagnosis not present

## 2022-06-30 DIAGNOSIS — J449 Chronic obstructive pulmonary disease, unspecified: Secondary | ICD-10-CM | POA: Insufficient documentation

## 2022-06-30 DIAGNOSIS — Z1152 Encounter for screening for COVID-19: Secondary | ICD-10-CM | POA: Insufficient documentation

## 2022-06-30 DIAGNOSIS — J189 Pneumonia, unspecified organism: Secondary | ICD-10-CM

## 2022-06-30 LAB — BASIC METABOLIC PANEL
Anion gap: 9 (ref 5–15)
BUN: 12 mg/dL (ref 6–20)
CO2: 23 mmol/L (ref 22–32)
Calcium: 9.1 mg/dL (ref 8.9–10.3)
Chloride: 105 mmol/L (ref 98–111)
Creatinine, Ser: 0.66 mg/dL (ref 0.61–1.24)
GFR, Estimated: >60 mL/min (ref >60)
Glucose, Bld: 119 mg/dL — ABNORMAL HIGH (ref 70–99)
Potassium: 3.4 mmol/L — ABNORMAL LOW (ref 3.5–5.1)
Sodium: 137 mmol/L (ref 135–145)

## 2022-06-30 LAB — CBC
HCT: 38.1 % — ABNORMAL LOW (ref 39.0–52.0)
Hemoglobin: 12.9 g/dL — ABNORMAL LOW (ref 13.0–17.0)
MCH: 30.6 pg (ref 26.0–34.0)
MCHC: 33.9 g/dL (ref 30.0–36.0)
MCV: 90.5 fL (ref 80.0–100.0)
Platelets: 165 10*3/uL (ref 150–400)
RBC: 4.21 MIL/uL — ABNORMAL LOW (ref 4.22–5.81)
RDW: 12.5 % (ref 11.5–15.5)
WBC: 4.3 10*3/uL (ref 4.0–10.5)
nRBC: 0 % (ref 0.0–0.2)

## 2022-06-30 LAB — CBG MONITORING, ED: Glucose-Capillary: 96 mg/dL (ref 70–99)

## 2022-06-30 LAB — RESP PANEL BY RT-PCR (RSV, FLU A&B, COVID)  RVPGX2
Influenza A by PCR: NEGATIVE
Influenza B by PCR: NEGATIVE
Resp Syncytial Virus by PCR: NEGATIVE
SARS Coronavirus 2 by RT PCR: NEGATIVE

## 2022-06-30 LAB — TROPONIN I (HIGH SENSITIVITY)
Troponin I (High Sensitivity): 6 ng/L (ref <18)
Troponin I (High Sensitivity): 5 ng/L (ref <18)

## 2022-06-30 MED ORDER — MECLIZINE HCL 25 MG PO TABS
25.0000 mg | ORAL_TABLET | Freq: Once | ORAL | Status: AC
  Administered 2022-06-30: 25 mg via ORAL
  Filled 2022-06-30: qty 1

## 2022-06-30 MED ORDER — ONDANSETRON 4 MG PO TBDP
8.0000 mg | ORAL_TABLET | Freq: Once | ORAL | Status: AC
  Administered 2022-06-30: 8 mg via ORAL
  Filled 2022-06-30: qty 2

## 2022-06-30 MED ORDER — AMOXICILLIN 500 MG PO CAPS: 1000.0000 mg | ORAL_CAPSULE | Freq: Three times a day (TID) | ORAL | 0 refills | Status: AC

## 2022-06-30 MED ORDER — AZITHROMYCIN 250 MG PO TABS: 250.0000 mg | ORAL_TABLET | Freq: Every day | ORAL | 0 refills | Status: DC

## 2022-06-30 MED ORDER — IPRATROPIUM-ALBUTEROL 0.5-2.5 (3) MG/3ML IN SOLN
3.0000 mL | Freq: Once | RESPIRATORY_TRACT | Status: AC
  Administered 2022-06-30: 3 mL via RESPIRATORY_TRACT
  Filled 2022-06-30: qty 3

## 2022-06-30 MED ORDER — HYDROCODONE-ACETAMINOPHEN 5-325 MG PO TABS
1.0000 | ORAL_TABLET | Freq: Once | ORAL | Status: AC
  Administered 2022-06-30: 1 via ORAL
  Filled 2022-06-30: qty 1

## 2022-06-30 MED ORDER — PREDNISONE 20 MG PO TABS
60.0000 mg | ORAL_TABLET | Freq: Once | ORAL | Status: AC
  Administered 2022-06-30: 60 mg via ORAL
  Filled 2022-06-30: qty 3

## 2022-06-30 NOTE — ED Provider Notes (Signed)
[facility] Provider Note   CSN: [id number] Arrival date & time: 06/30/22  [id number]     History  Chief Complaint  Patient presents with   Chest Pain    [name] is a 62 y.o. male with medical history of schizophrenia, protein S deficiency, hiatal hernia, GERD, COPD, chronic pain, bipolar disorder, asthma.  Patient presents to ED for evaluation of chest pain, shortness of breath.  Patient reports that for the last 15 years he has had chest pain, worse in the last 2 weeks.  Patient states that he is also more short of breath for the last 3 days.  Patient reports history of COPD, states he longer smokes, states last cigarette was 2 days ago.  Denies home oxygen use.  Patient also endorsing cough, generalized bodyaches and chills.  Patient denies fevers.  Patient goes on to state that right now he is currently nauseous however has not vomited.  Patient reports remote history of pulmonary embolism in 2012, longer anticoagulated.   Chest Pain Associated symptoms: nausea and shortness of breath        Home Medications Prior to Admission medications   Medication Sig Start Date End Date Taking? Authorizing Provider  albuterol (VENTOLIN HFA) 108 (90 Base) MCG/ACT inhaler Inhale 1-2 puffs into the lungs every 6 (six) hours as needed for wheezing or shortness of breath. 06/07/22   Small, Brooke L, PA  atorvastatin (LIPITOR) 10 MG tablet Take 1 tablet by mouth daily. 06/11/21   [provider]  cephALEXin (KEFLEX) 500 MG capsule Take 1 capsule (500 mg total) by mouth 4 (four) times daily. Patient not taking: Reported on 06/25/2022 06/07/22   Small, Brooke L, PA  doxycycline (VIBRAMYCIN) 100 MG capsule Take 1 capsule (100 mg total) by mouth 2 (two) times daily. Patient not taking: Reported on 06/25/2022 06/07/22   Small, Brooke L, PA  meclizine (ANTIVERT) 25 MG tablet Take 25 mg by mouth 3 (three) times daily. Patient not taking: Reported on  06/25/2022 05/06/22   [provider]  omeprazole (PRILOSEC) 40 MG capsule Take 40 mg by mouth daily. 04/21/22   [provider]  ondansetron (ZOFRAN-ODT) 8 MG disintegrating tablet Take 8 mg by mouth every 6 (six) hours. Patient not taking: Reported on 06/25/2022 01/21/22   [provider]  rivaroxaban (XARELTO) 20 MG TABS tablet Take 20 mg by mouth daily.    [provider]  triamterene-hydrochlorothiazide (MAXZIDE-25) 37.5-25 MG tablet Take 0.5 tablets by mouth daily. 05/06/22   [provider]      Allergies    Patient has no known allergies.    Review of Systems   Review of Systems  Respiratory:  Positive for shortness of breath.   Cardiovascular:  Positive for chest pain.  Gastrointestinal:  Positive for nausea.  All other systems reviewed and are negative.   Physical Exam Updated Vital Signs BP 132/85   Pulse 67   Temp 97.9 F (36.6 C) (Oral)   Resp (!) 21   SpO2 94%  Physical Exam Vitals and nursing note reviewed.  Constitutional:      General: He is not in acute distress.    Appearance: Normal appearance. He is not ill-appearing, toxic-appearing or diaphoretic.  HENT:     Head: Normocephalic and atraumatic.     Nose: Nose normal.     Mouth/Throat:     Mouth: Mucous membranes are moist.     Pharynx: Oropharynx is clear.  Eyes:  Extraocular Movements: Extraocular movements intact.     Conjunctiva/sclera: Conjunctivae normal.     Pupils: Pupils are equal, round, and reactive to light.  Cardiovascular:     Rate and Rhythm: Normal rate and regular rhythm.  Pulmonary:     Effort: Pulmonary effort is normal.     Breath sounds: Normal breath sounds. No wheezing.  Abdominal:     General: Abdomen is flat. Bowel sounds are normal.     Palpations: Abdomen is soft.     Tenderness: There is no abdominal tenderness.  Musculoskeletal:     Cervical back: Normal range of motion and neck supple. No tenderness.  Skin:    General:  Skin is warm and dry.     Capillary Refill: Capillary refill takes less than 2 seconds.  Neurological:     Mental Status: He is alert and oriented to person, place, and time.     ED Results / Procedures / Treatments   Labs (all labs ordered are listed, but only abnormal results are displayed) Labs Reviewed  BASIC METABOLIC PANEL - Abnormal; Notable for the following components:      Result Value   Potassium 3.4 (*)    Glucose, Bld 119 (*)    All other components within normal limits  CBC - Abnormal; Notable for the following components:   RBC 4.21 (*)    Hemoglobin 12.9 (*)    HCT 38.1 (*)    All other components within normal limits  RESP PANEL BY RT-PCR (RSV, FLU A&B, COVID)  RVPGX2  CBG MONITORING, ED  TROPONIN I (HIGH SENSITIVITY)  TROPONIN I (HIGH SENSITIVITY)    EKG EKG Interpretation  Date/Time:  Monday June 30 2022 09:31:24 EDT Ventricular Rate:  67 PR Interval:  154 QRS Duration: 116 QT Interval:  410 QTC Calculation: 433 R Axis:   47 Text Interpretation: Normal sinus rhythm Normal ECG When compared with ECG of 13-Jul-2021 14:47, PREVIOUS ECG IS PRESENT No significant change since last tracing Confirmed by Blanchie Dessert [phone]) on 06/30/2022 10:38:08 AM  Radiology DG Chest 2 View  Result Date: 06/30/2022 CLINICAL DATA:  Chest pain, shortness of breath, smoker EXAM: CHEST - 2 VIEW COMPARISON:  06/07/2022 FINDINGS: Increased left basilar streaky ill-defined opacity obscuring the left hemidiaphragm which is posterior on the lateral view, suspicious for left basilar pneumonia. Right lung remains clear. Normal heart size and vascularity. Trachea midline. Degenerative changes throughout the thoracic spine. IMPRESSION: Left basilar airspace opacity concerning for pneumonia. Electronically Signed   By: Jerilynn Mages.  Shick M.D.   On: 06/30/2022 10:01    Procedures Procedures   Medications Ordered in ED Medications  ipratropium-albuterol (DUONEB) 0.5-2.5 (3) MG/3ML nebulizer  solution 3 mL (3 mLs Nebulization Given 06/30/22 1124)  predniSONE (DELTASONE) tablet 60 mg (60 mg Oral Given 06/30/22 1300)  ondansetron (ZOFRAN-ODT) disintegrating tablet 8 mg (8 mg Oral Given 06/30/22 1124)  HYDROcodone-acetaminophen (NORCO/VICODIN) 5-325 MG per tablet 1 tablet (1 tablet Oral Given 06/30/22 1259)  meclizine (ANTIVERT) tablet 25 mg (25 mg Oral Given 06/30/22 1542)    ED Course/ Medical Decision Making/ A&P  Medical Decision Making Amount and/or Complexity of Data Reviewed Labs: ordered. Radiology: ordered.  Risk Prescription drug management.   62 year old male presents to ED for evaluation.  Please see HPI for further details.  On examination the patient is afebrile and nontachycardic.  The patient on sounds are rhonchorous throughout, he is not hypoxic on room air.  The abdomen is soft and compressible.  Neurological examination at baseline.  Patient workup will include CBC, BMP, troponin x 2, viral panel, chest x-ray and EKG.  Administered 60 mg prednisone, DuoNeb, Zofran.  Patient was provided meclizine secondary to dizziness.  Patient reports history of vertigo.  CBC unremarkable without leukocytosis or anemia.  BMP unremarkable without electrolyte derangement, creatinine is stable.  Viral panel negative for all.  Troponin 6 and 5.  EKG nonischemic.  Chest x-ray shows left basilar airspace opacity concerning for pneumonia.  At end of my shift, the patient needs to be ambulated to assess for hypoxia.  The patient will be signed out to oncoming provider pending reassessment.  If patient maintains oxygen saturation the patient can be sent home with CAP treatment.  If the patient desaturates, he will need admission for hypoxia secondary to CAP.   Final Clinical Impression(s) / ED Diagnoses Final diagnoses:  Community acquired pneumonia of left lung, unspecified part of lung    Rx / DC Orders ED Discharge Orders     None         Lawana Chambers 06/30/22 1543    Blanchie Dessert, MD 06/30/22 2122

## 2022-06-30 NOTE — ED Notes (Signed)
Pt ambulatory to bathroom and O2 sat maintained 98%

## 2022-06-30 NOTE — ED Notes (Signed)
Checked patient cbg it was 67 notified RN of blood sugar patiet is restig with call bell in reach

## 2022-06-30 NOTE — ED Notes (Signed)
Pt eating [place] sandwich at bedside

## 2022-06-30 NOTE — ED Notes (Signed)
Per PA attempted to ambulate patient for pulse oximetry, patient requested to wait until headache medicine kicks in says he "can't walk yet and he will puke if he does". This nurse told him we would return in 15 minutes to attempt ambulation

## 2022-06-30 NOTE — ED Provider Notes (Cosign Needed Addendum)
Patient was handed off from Astrid Drafts, [place]. Physical Exam  BP 132/85   Pulse 67   Temp 97.9 F (36.6 C) (Oral)   Resp (!) 21   SpO2 94%   Physical Exam  Procedures  Procedures  ED Course / MDM   Clinical Course as of 06/30/22 1647  Mon Jun 30, 2022  1536 Handoff plan: Ambulate and evaluate for desaturation. If patient does not desat, can dc home with CAP treatment. If desats, discuss admission for CAP with secondary hypoxia. [OZ]    Clinical Course User Index [OZ] Luvenia Heller, PA-C   Medical Decision Making Amount and/or Complexity of Data Reviewed Labs: ordered. Radiology: ordered.  Risk Prescription drug management.   Patient was handed off from Astrid Drafts, [place].  Please see their note for full HPI.  Plan at time of handoff was to assess patient for possible desaturation on ambulation.  Nurse advised me that patient was walked around the unit and oxygen saturation remained above 98% the entire time without any assistance.  Will treat patient outpatient for community-acquired pneumonia with combination of amoxicillin and azithromycin.  Patient is hesitant to discharge home because in the past he has been admitted for pneumonia due to hypoxia but given no evidence of desaturation, patient does not require admission at this time.  All questions answered prior to patient discharge.     Luvenia Heller, PA-C 06/30/22 1649    Fredia Sorrow, MD 07/01/22 1351

## 2022-06-30 NOTE — Discharge Instructions (Signed)
You are seen in the emergency department diagnosed with community-acquired pneumonia based on chest x-ray imaging.  To antibiotics been sent to your pharmacy to manage this which is to take in their entirety over the course of the next 5 days.  If you begin to experience any significant or worsening shortness of breath, chest pain please return to the emergency department for further evaluation.

## 2022-06-30 NOTE — ED Notes (Signed)
Got patient back on the monitor patient is resting with call bell in reach

## 2022-06-30 NOTE — ED Triage Notes (Signed)
Patient here for evaluation of chest pain and shortness of breath that started three or four weeks ago. Patient states he previously was prescribed nitroglycerin but states his physician no longer prescribes it for him. Patient is alert, oriented, and in no apparent distress at this time. Patient also complains of left knee pain, states he has had it evaluated multiple times but has not received a diagnosis.

## 2022-06-30 NOTE — ED Notes (Signed)
Pt asked to hold prednisone until nausea subsides, worried he won't be able to keep meds down

## 2022-07-01 ENCOUNTER — Ambulatory Visit: Payer: Medicare HMO | Admitting: Urology

## 2022-07-01 DIAGNOSIS — R310 Gross hematuria: Secondary | ICD-10-CM

## 2022-09-04 ENCOUNTER — Other Ambulatory Visit: Payer: Self-pay

## 2022-09-04 ENCOUNTER — Ambulatory Visit (INDEPENDENT_AMBULATORY_CARE_PROVIDER_SITE_OTHER): Payer: Medicare HMO | Admitting: Orthopaedic Surgery

## 2022-09-04 ENCOUNTER — Encounter: Payer: Self-pay | Admitting: Orthopaedic Surgery

## 2022-09-04 VITALS — Ht 74.0 in | Wt 201.0 lb

## 2022-09-04 DIAGNOSIS — G8929 Other chronic pain: Secondary | ICD-10-CM | POA: Diagnosis not present

## 2022-09-04 DIAGNOSIS — M545 Low back pain, unspecified: Secondary | ICD-10-CM | POA: Diagnosis not present

## 2022-09-04 DIAGNOSIS — M25562 Pain in left knee: Secondary | ICD-10-CM | POA: Diagnosis not present
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
## 2022-11-13 ENCOUNTER — Other Ambulatory Visit: Payer: Self-pay

## 2022-11-13 ENCOUNTER — Emergency Department (HOSPITAL_COMMUNITY)
Admission: EM | Admit: 2022-11-13 | Discharge: 2022-11-14 | Disposition: A | Discharge status: Home / Self Care | Payer: Medicare HMO | Attending: Emergency Medicine | Admitting: Emergency Medicine

## 2022-11-13 ENCOUNTER — Encounter (HOSPITAL_COMMUNITY): Payer: Self-pay

## 2022-11-13 DIAGNOSIS — T1591XA Foreign body on external eye, part unspecified, right eye, initial encounter: Secondary | ICD-10-CM | POA: Diagnosis present

## 2022-11-13 DIAGNOSIS — Y9389 Activity, other specified: Secondary | ICD-10-CM | POA: Diagnosis not present

## 2022-11-13 DIAGNOSIS — X58XXXA Exposure to other specified factors, initial encounter: Secondary | ICD-10-CM | POA: Insufficient documentation

## 2022-11-13 NOTE — ED Triage Notes (Signed)
Pt was grinding metal tonight and piece is stuck in right eye. Pain 10/10

## 2022-11-14 DIAGNOSIS — T1591XA Foreign body on external eye, part unspecified, right eye, initial encounter: Secondary | ICD-10-CM | POA: Diagnosis not present

## 2022-11-14 MED ORDER — SULFACETAMIDE SODIUM 10 % OP SOLN
1.0000 [drp] | Freq: Four times a day (QID) | OPHTHALMIC | Status: DC
  Filled 2022-11-14: qty 15

## 2022-11-14 MED ORDER — KETOROLAC TROMETHAMINE 0.5 % OP SOLN
1.0000 [drp] | Freq: Once | OPHTHALMIC | Status: AC
  Administered 2022-11-14: 1 [drp] via OPHTHALMIC
  Filled 2022-11-14: qty 5

## 2022-11-14 MED ORDER — FLUORESCEIN SODIUM 1 MG OP STRP
1.0000 | ORAL_STRIP | Freq: Once | OPHTHALMIC | Status: AC
  Administered 2022-11-14: 1 via OPHTHALMIC
  Filled 2022-11-14: qty 1

## 2022-11-14 MED ORDER — TETRACAINE HCL 0.5 % OP SOLN
2.0000 [drp] | Freq: Once | OPHTHALMIC | Status: AC
  Administered 2022-11-14: 2 [drp] via OPHTHALMIC
  Filled 2022-11-14: qty 4

## 2022-11-14 NOTE — ED Provider Notes (Signed)
[place] EMERGENCY DEPARTMENT AT [facility] Provider Note   CSN: [id number] Arrival date & time: 11/13/22  2303     History  Chief Complaint  Patient presents with   Eye Pain    [name] is a 62 y.o. male.  Patient presents to the emergency department with concerns over a piece of brass in his eye.  He reports that he was polishing and grinding on breast tonight and got a piece in his eye.  He did flush it multiple times and thinks he might of gotten the particle out, but he still has a lot of irritation.       Home Medications Prior to Admission medications   Medication Sig Start Date End Date Taking? Authorizing Provider  albuterol (VENTOLIN HFA) 108 (90 Base) MCG/ACT inhaler Inhale 1-2 puffs into the lungs every 6 (six) hours as needed for wheezing or shortness of breath. 06/07/22   Small, Brooke L, PA  atorvastatin (LIPITOR) 10 MG tablet Take 1 tablet by mouth daily. 06/11/21   [provider]  azithromycin (ZITHROMAX) 250 MG tablet Take 1 tablet (250 mg total) by mouth daily. Take first 2 tablets together, then 1 every day until finished. Patient not taking: Reported on 09/04/2022 06/30/22   Maryanna Shape A, PA-C  cephALEXin (KEFLEX) 500 MG capsule Take 1 capsule (500 mg total) by mouth 4 (four) times daily. Patient not taking: Reported on 06/25/2022 06/07/22   Small, Brooke L, PA  doxycycline (VIBRAMYCIN) 100 MG capsule Take 1 capsule (100 mg total) by mouth 2 (two) times daily. Patient not taking: Reported on 06/25/2022 06/07/22   Small, Brooke L, PA  meclizine (ANTIVERT) 25 MG tablet Take 25 mg by mouth 3 (three) times daily. Patient not taking: Reported on 06/25/2022 05/06/22   [provider]  omeprazole (PRILOSEC) 40 MG capsule Take 40 mg by mouth daily. 04/21/22   [provider]  ondansetron (ZOFRAN-ODT) 8 MG disintegrating tablet Take 8 mg by mouth every 6 (six) hours. Patient not taking: Reported on 06/25/2022 01/21/22   [provider]  rivaroxaban (XARELTO) 20 MG TABS tablet Take 20 mg by mouth daily.    [provider]  triamterene-hydrochlorothiazide (MAXZIDE-25) 37.5-25 MG tablet Take 0.5 tablets by mouth daily. 05/06/22   [provider]      Allergies    Patient has no known allergies.    Review of Systems   Review of Systems  Physical Exam Updated Vital Signs BP 113/79   Pulse 69   Temp 97.8 F (36.6 C) (Oral)   Resp 18   SpO2 99%  Physical Exam Eyes:     General: Lids are normal. Lids are everted, no foreign bodies appreciated. Vision grossly intact. Gaze aligned appropriately.        Right eye: No foreign body or discharge.     Extraocular Movements: Extraocular movements intact.     Conjunctiva/sclera:     Right eye: Right conjunctiva is injected. No chemosis, exudate or hemorrhage.    Pupils: Pupils are equal, round, and reactive to light.     Right eye: No corneal abrasion or fluorescein uptake. Seidel exam negative.     Slit lamp exam:    Right eye: Anterior chamber quiet. No corneal flare, corneal ulcer, hyphema, hypopyon or photophobia.  Skin:    Findings: No erythema, lesion or rash.  Neurological:     Cranial Nerves: Cranial nerves 2-12 are intact.     ED Results / Procedures /  Treatments   Labs (all labs ordered are listed, but only abnormal results are displayed) Labs Reviewed - No data to display  EKG None  Radiology No results found.  Procedures Procedures    Medications Ordered in ED Medications  tetracaine (PONTOCAINE) 0.5 % ophthalmic solution 2 drop (has no administration in time range)  fluorescein ophthalmic strip 1 strip (has no administration in time range)    ED Course/ Medical Decision Making/ A&P                                 Medical Decision Making Risk Prescription drug management.   Presents with irritation of the right eye after getting some metal in it earlier tonight.  He did flush the eye multiple times and I  do not see any foreign body.  Eye was extensively evaluated with slit lamp, areas under the eyelids evaluated.  Fluorescein exam negative, no evidence of residual corneal abrasion.        Final Clinical Impression(s) / ED Diagnoses Final diagnoses:  Foreign body of right eye, initial encounter    Rx / DC Orders ED Discharge Orders     None         , Canary Brim, MD 11/14/22 [phone]

## 2022-12-08 ENCOUNTER — Ambulatory Visit (INDEPENDENT_AMBULATORY_CARE_PROVIDER_SITE_OTHER): Payer: Medicare HMO | Admitting: Urology

## 2022-12-08 VITALS — BP 118/77 | HR 78

## 2022-12-08 DIAGNOSIS — R310 Gross hematuria: Secondary | ICD-10-CM

## 2022-12-08 DIAGNOSIS — R351 Nocturia: Secondary | ICD-10-CM

## 2022-12-08 DIAGNOSIS — N401 Enlarged prostate with lower urinary tract symptoms: Secondary | ICD-10-CM | POA: Diagnosis not present

## 2022-12-08 DIAGNOSIS — N138 Other obstructive and reflux uropathy: Secondary | ICD-10-CM

## 2022-12-08 LAB — BLADDER SCAN AMB NON-IMAGING: Scan Result: 20

## 2022-12-08 MED ORDER — ALFUZOSIN HCL ER 10 MG PO TB24: 10.0000 mg | ORAL_TABLET | Freq: Every day | ORAL | 11 refills | Status: DC

## 2022-12-08 NOTE — Progress Notes (Unsigned)
post void residual= 20

## 2022-12-08 NOTE — Progress Notes (Unsigned)
12/08/2022 3:15 PM   [name] [birth year] [id number]  Referring provider: Marylynn Pearson, FNP [address] [place],  [place] [address]  Gross hematuria   HPI: [name] is a 62yo here for evaluation of gross hematuria. IPSS 21 QOL 6 on no BPH therapy. He has intermittent painless gross hematuria for the past 2 years. He has a 40pk year smoking hx. 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 No hx of nephrolithiasis.    PMH: Past Medical History:  Diagnosis Date   Anxiety    Arthritis    Asthma    Bipolar disorder (HCC)    Chronic pain    COPD (chronic obstructive pulmonary disease) (HCC)    Degenerative disk disease    GERD (gastroesophageal reflux disease)    Hiatal hernia    History of pneumonia    PE (pulmonary embolism) 2012   Protein S deficiency (HCC)    Schizophrenia (HCC)     Surgical History: Past Surgical History:  Procedure Laterality Date   CHOLECYSTECTOMY     CLOSED REDUCTION MANDIBLE WITH MANDIBULOMA  10/21/2011   Procedure: CLOSED REDUCTION MANDIBLE WITH MANDIBULOMAXILLARY FUSION;  Surgeon: Darletta Moll, MD;  Location: [facility];  Service: ENT;  Laterality: N/A;  MMF screws   HAND SURGERY Right 20 years ago   INCISION AND DRAINAGE OF WOUND Left 08/27/2021   Procedure: IRRIGATION AND DEBRIDEMENT WOUND; left leg;  Surgeon: Lucretia Roers, MD;  Location: [facility];  Service: General;  Laterality: Left;   INCISIONAL HERNIA REPAIR N/A 10/27/2018   Procedure: HERNIA REPAIR INCISIONAL WITH MESH;  Surgeon: Franky Macho, MD;  Location: [facility];  Service: General;  Laterality: N/A;   INCISIONAL HERNIA REPAIR N/A 02/23/2019   Procedure: LAPAROSCOPIC INCISIONAL HERNIA REPAIR WITH MESH;  Surgeon: Franky Macho, MD;  Location: [facility];  Service: General;  Laterality: N/A;   LEG SURGERY     MANDIBULAR HARDWARE REMOVAL  11/11/2011   Procedure: MANDIBULAR HARDWARE REMOVAL;  Surgeon: Darletta Moll, MD;  Location: [facility];  Service: ENT;   Laterality: N/A;   ORIF MANDIBULAR FRACTURE  10/21/2011   Procedure: OPEN REDUCTION INTERNAL FIXATION (ORIF) MANDIBULAR FRACTURE;  Surgeon: Darletta Moll, MD;  Location: [facility];  Service: ENT;  Laterality: N/A;   UMBILICAL HERNIA REPAIR      Home Medications:  Allergies as of 12/08/2022   No Known Allergies      Medication List        Accurate as of December 08, 2022  3:15 PM. If you have any questions, ask your nurse or doctor.          albuterol 108 (90 Base) MCG/ACT inhaler Commonly known as: VENTOLIN HFA Inhale 1-2 puffs into the lungs every 6 (six) hours as needed for wheezing or shortness of breath.   atorvastatin 10 MG tablet Commonly known as: LIPITOR Take 1 tablet by mouth daily.   azithromycin 250 MG tablet Commonly known as: ZITHROMAX Take 1 tablet (250 mg total) by mouth daily. Take first 2 tablets together, then 1 every day until finished.   cephALEXin 500 MG capsule Commonly known as: KEFLEX Take 1 capsule (500 mg total) by mouth 4 (four) times daily.   doxycycline 100 MG capsule Commonly known as: VIBRAMYCIN Take 1 capsule (100 mg total) by mouth 2 (two) times daily.   meclizine 25 MG tablet Commonly known as: ANTIVERT Take 25 mg by mouth 3 (three) times daily.  omeprazole 40 MG capsule Commonly known as: PRILOSEC Take 40 mg by mouth daily.   ondansetron 8 MG disintegrating tablet Commonly known as: ZOFRAN-ODT Take 8 mg by mouth every 6 (six) hours.   rivaroxaban 20 MG Tabs tablet Commonly known as: XARELTO Take 20 mg by mouth daily.   triamterene-hydrochlorothiazide 37.5-25 MG tablet Commonly known as: MAXZIDE-25 Take 0.5 tablets by mouth daily.        Allergies: No Known Allergies  Family History: Family History  Problem Relation Age of Onset   Coronary artery disease Mother    Colon polyps Mother    Coronary artery disease Father    Colon polyps Father    Diabetes Other     Social History:  reports that he has been smoking  cigarettes. He has a 8.8 pack-year smoking history. He has never used smokeless tobacco. He reports current drug use. Drug: Marijuana. He reports that he does not drink alcohol.  ROS: All other review of systems were reviewed and are negative except what is noted above in HPI  Physical Exam: BP 118/77   Pulse 78   Constitutional:  Alert and oriented, No acute distress. HEENT: [place] AT, moist mucus membranes.  Trachea midline, no masses. Cardiovascular: No clubbing, cyanosis, or edema. Respiratory: Normal respiratory effort, no increased work of breathing. GI: Abdomen is soft, nontender, nondistended, no abdominal masses GU: No CVA tenderness.  Lymph: No cervical or inguinal lymphadenopathy. Skin: No rashes, bruises or suspicious lesions. Neurologic: Grossly intact, no focal deficits, moving all 4 extremities. Psychiatric: Normal mood and affect.  Laboratory Data: Lab Results  Component Value Date   WBC 4.3 06/30/2022   HGB 12.9 (L) 06/30/2022   HCT 38.1 (L) 06/30/2022   MCV 90.5 06/30/2022   PLT 165 06/30/2022    Lab Results  Component Value Date   CREATININE 0.66 06/30/2022    No results found for: "PSA"  No results found for: "TESTOSTERONE"  Lab Results  Component Value Date   HGBA1C  09/05/2010    5.2 (NOTE)                                                                       According to the ADA Clinical Practice Recommendations for 2011, when HbA1c is used as a screening test:   >=6.5%   Diagnostic of Diabetes Mellitus           (if abnormal result  is confirmed)  5.7-6.4%   Increased risk of developing Diabetes Mellitus  References:Diagnosis and Classification of Diabetes Mellitus,Diabetes Care,2011,34(Suppl 1):S62-S69 and Standards of Medical Care in         Diabetes - 2011,Diabetes Care,2011,34  (Suppl 1):S11-S61.    Urinalysis    Component Value Date/Time   COLORURINE YELLOW 01/10/2021 0900   APPEARANCEUR CLEAR 01/10/2021 0900   LABSPEC 1.019 01/10/2021 0900    PHURINE 6.0 01/10/2021 0900   GLUCOSEU NEGATIVE 01/10/2021 0900   HGBUR MODERATE (A) 01/10/2021 0900   BILIRUBINUR negative 05/07/2022 1212   KETONESUR negative 05/07/2022 1212   KETONESUR NEGATIVE 01/10/2021 0900   PROTEINUR negative 05/07/2022 1212   PROTEINUR NEGATIVE 01/10/2021 0900   UROBILINOGEN 1.0 05/07/2022 1212   UROBILINOGEN 1.0 02/05/2015 1915   NITRITE Negative 05/07/2022 1212   NITRITE  NEGATIVE 01/10/2021 0900   LEUKOCYTESUR Negative 05/07/2022 1212   LEUKOCYTESUR NEGATIVE 01/10/2021 0900    Lab Results  Component Value Date   BACTERIA NONE SEEN 01/10/2021    Pertinent Imaging: *** No results found for this or any previous visit.  Results for orders placed during the hospital encounter of 10/17/19  US Venous Img Lower Bilateral (DVT)  Narrative CLINICAL DATA:  Shortness of breath, positive D-dimer  EXAM: BILATERAL LOWER EXTREMITY VENOUS DOPPLER ULTRASOUND  TECHNIQUE: Gray-scale sonography with graded compression, as well as color Doppler and duplex ultrasound were performed to evaluate the lower extremity deep venous systems from the level of the common femoral vein and including the common femoral, femoral, profunda femoral, popliteal and calf veins including the posterior tibial, peroneal and gastrocnemius veins when visible. The superficial great saphenous vein was also interrogated. Spectral Doppler was utilized to evaluate flow at rest and with distal augmentation maneuvers in the common femoral, femoral and popliteal veins.  COMPARISON:  None.  FINDINGS: RIGHT LOWER EXTREMITY  Common Femoral Vein: No evidence of thrombus. Normal compressibility, respiratory phasicity and response to augmentation.  Saphenofemoral Junction: No evidence of thrombus. Normal compressibility and flow on color Doppler imaging.  Profunda Femoral Vein: No evidence of thrombus. Normal compressibility and flow on color Doppler imaging.  Femoral Vein: No evidence  of thrombus. Normal compressibility, respiratory phasicity and response to augmentation.  Popliteal Vein: No evidence of thrombus. Normal compressibility, respiratory phasicity and response to augmentation.  Calf Veins: No evidence of thrombus. Normal compressibility and flow on color Doppler imaging.  LEFT LOWER EXTREMITY  Common Femoral Vein: No evidence of thrombus. Normal compressibility, respiratory phasicity and response to augmentation.  Saphenofemoral Junction: No evidence of thrombus. Normal compressibility and flow on color Doppler imaging.  Profunda Femoral Vein: No evidence of thrombus. Normal compressibility and flow on color Doppler imaging.  Femoral Vein: No evidence of thrombus. Normal compressibility, respiratory phasicity and response to augmentation.  Popliteal Vein: No evidence of thrombus. Normal compressibility, respiratory phasicity and response to augmentation.  Calf Veins: No evidence of thrombus. Normal compressibility and flow on color Doppler imaging.  IMPRESSION: No evidence of deep venous thrombosis in either lower extremity.   Electronically Signed By: Judie Petit.  Shick M.D. On: 10/17/2019 13:33  No results found for this or any previous visit.  No results found for this or any previous visit.  No results found for this or any previous visit.  No valid procedures specified. No results found for this or any previous visit.  No results found for this or any previous visit.   Assessment & Plan:    1. Gross hematuria BMP CT hematuria Office cystoscopy - Urinalysis, Routine w reflex microscopic - BLADDER SCAN AMB NON-IMAGING  2. BPH with nocturia -we will trial uroxatral 10mg  qhs  No follow-ups on file.  Wilkie Aye, MD  [facility] [place]

## 2022-12-09 ENCOUNTER — Other Ambulatory Visit: Payer: Medicare HMO

## 2022-12-09 LAB — URINALYSIS, ROUTINE W REFLEX MICROSCOPIC
Bilirubin, UA: NEGATIVE
Glucose, UA: NEGATIVE
Ketones, UA: NEGATIVE
Leukocytes,UA: NEGATIVE
Nitrite, UA: NEGATIVE
Protein,UA: NEGATIVE
RBC, UA: NEGATIVE
Specific Gravity, UA: 1.02 (ref 1.005–1.030)
Urobilinogen, Ur: 0.2 mg/dL (ref 0.2–1.0)
pH, UA: 7 (ref 5.0–7.5)

## 2022-12-10 ENCOUNTER — Encounter: Payer: Self-pay | Admitting: Urology

## 2022-12-10 LAB — BASIC METABOLIC PANEL
BUN/Creatinine Ratio: 19 (ref 10–24)
BUN: 14 mg/dL (ref 8–27)
CO2: 24 mmol/L (ref 20–29)
Calcium: 9.5 mg/dL (ref 8.6–10.2)
Chloride: 101 mmol/L (ref 96–106)
Creatinine, Ser: 0.73 mg/dL — ABNORMAL LOW (ref 0.76–1.27)
Glucose: 119 mg/dL — ABNORMAL HIGH (ref 70–99)
Potassium: 4.1 mmol/L (ref 3.5–5.2)
Sodium: 139 mmol/L (ref 134–144)
eGFR: 104 mL/min/{1.73_m2} (ref >59)

## 2022-12-10 NOTE — Patient Instructions (Signed)

## 2022-12-23 ENCOUNTER — Ambulatory Visit (HOSPITAL_COMMUNITY)
Admission: RE | Admit: 2022-12-23 | Discharge: 2022-12-23 | Disposition: A | Discharge status: Home / Self Care | Payer: Medicare HMO | Source: Ambulatory Visit | Attending: Urology | Admitting: Urology

## 2022-12-23 DIAGNOSIS — R310 Gross hematuria: Secondary | ICD-10-CM | POA: Diagnosis present

## 2022-12-23 MED ORDER — IOHEXOL 300 MG/ML  SOLN
125.0000 mL | Freq: Once | INTRAMUSCULAR | Status: AC | PRN
  Administered 2022-12-23: 125 mL via INTRAVENOUS

## 2023-01-01 ENCOUNTER — Telehealth: Payer: Self-pay | Admitting: Urology

## 2023-01-01 NOTE — Telephone Encounter (Signed)
Patient called he is in a lot of pain and would like to know his results for the test you did , that way he knows if he needs to move up his appointment.

## 2023-01-01 NOTE — Telephone Encounter (Signed)
Message forward to Dr. Ronne Binning.

## 2023-01-06 NOTE — Telephone Encounter (Signed)
  [name] was made aware of Dr. Ronne Binning  "CT was normla from a GU standpoint." [name] voiced understanding

## 2023-01-26 ENCOUNTER — Other Ambulatory Visit: Payer: Medicare HMO | Admitting: Urology

## 2023-01-27 ENCOUNTER — Ambulatory Visit: Payer: Medicare HMO | Admitting: General Surgery

## 2023-03-10 ENCOUNTER — Other Ambulatory Visit (HOSPITAL_COMMUNITY): Payer: Self-pay | Admitting: Family Medicine

## 2023-03-10 DIAGNOSIS — J40 Bronchitis, not specified as acute or chronic: Secondary | ICD-10-CM

## 2023-03-11 ENCOUNTER — Other Ambulatory Visit: Payer: Self-pay

## 2023-03-11 ENCOUNTER — Emergency Department (HOSPITAL_COMMUNITY)
Admission: EM | Admit: 2023-03-11 | Discharge: 2023-03-11 | Disposition: A | Discharge status: Home / Self Care | Payer: Medicare HMO | Attending: Emergency Medicine | Admitting: Emergency Medicine

## 2023-03-11 ENCOUNTER — Encounter (HOSPITAL_COMMUNITY): Payer: Self-pay

## 2023-03-11 ENCOUNTER — Ambulatory Visit (HOSPITAL_COMMUNITY)
Admission: RE | Admit: 2023-03-11 | Discharge: 2023-03-11 | Disposition: A | Discharge status: Home / Self Care | Payer: Medicare HMO | Source: Ambulatory Visit | Attending: Family Medicine | Admitting: Family Medicine

## 2023-03-11 DIAGNOSIS — J069 Acute upper respiratory infection, unspecified: Secondary | ICD-10-CM | POA: Insufficient documentation

## 2023-03-11 DIAGNOSIS — Z7951 Long term (current) use of inhaled steroids: Secondary | ICD-10-CM | POA: Insufficient documentation

## 2023-03-11 DIAGNOSIS — J40 Bronchitis, not specified as acute or chronic: Secondary | ICD-10-CM | POA: Insufficient documentation

## 2023-03-11 DIAGNOSIS — Z1152 Encounter for screening for COVID-19: Secondary | ICD-10-CM | POA: Insufficient documentation

## 2023-03-11 DIAGNOSIS — Z7901 Long term (current) use of anticoagulants: Secondary | ICD-10-CM | POA: Diagnosis not present

## 2023-03-11 DIAGNOSIS — R1013 Epigastric pain: Secondary | ICD-10-CM | POA: Diagnosis not present

## 2023-03-11 DIAGNOSIS — R059 Cough, unspecified: Secondary | ICD-10-CM | POA: Diagnosis present

## 2023-03-11 DIAGNOSIS — J449 Chronic obstructive pulmonary disease, unspecified: Secondary | ICD-10-CM | POA: Insufficient documentation

## 2023-03-11 LAB — RESP PANEL BY RT-PCR (RSV, FLU A&B, COVID)  RVPGX2
Influenza A by PCR: NEGATIVE
Influenza B by PCR: NEGATIVE
Resp Syncytial Virus by PCR: NEGATIVE
SARS Coronavirus 2 by RT PCR: NEGATIVE

## 2023-03-11 LAB — COMPREHENSIVE METABOLIC PANEL
ALT: 16 U/L (ref 0–44)
AST: 16 U/L (ref 15–41)
Albumin: 3.5 g/dL (ref 3.5–5.0)
Alkaline Phosphatase: 47 U/L (ref 38–126)
Anion gap: 9 (ref 5–15)
BUN: 25 mg/dL — ABNORMAL HIGH (ref 6–20)
CO2: 23 mmol/L (ref 22–32)
Calcium: 8.9 mg/dL (ref 8.9–10.3)
Chloride: 106 mmol/L (ref 98–111)
Creatinine, Ser: 0.88 mg/dL (ref 0.61–1.24)
GFR, Estimated: >60 mL/min (ref >60)
Glucose, Bld: 123 mg/dL — ABNORMAL HIGH (ref 70–99)
Potassium: 3.4 mmol/L — ABNORMAL LOW (ref 3.5–5.1)
Sodium: 138 mmol/L (ref 135–145)
Total Bilirubin: 0.5 mg/dL (ref <1.2)
Total Protein: 6.4 g/dL — ABNORMAL LOW (ref 6.5–8.1)

## 2023-03-11 LAB — CBC WITH DIFFERENTIAL/PLATELET
Abs Immature Granulocytes: 0.01 10*3/uL (ref 0.00–0.07)
Basophils Absolute: 0 10*3/uL (ref 0.0–0.1)
Basophils Relative: 1 %
Eosinophils Absolute: 0.1 10*3/uL (ref 0.0–0.5)
Eosinophils Relative: 2 %
HCT: 38.2 % — ABNORMAL LOW (ref 39.0–52.0)
Hemoglobin: 12.9 g/dL — ABNORMAL LOW (ref 13.0–17.0)
Immature Granulocytes: 0 %
Lymphocytes Relative: 26 %
Lymphs Abs: 1.5 10*3/uL (ref 0.7–4.0)
MCH: 31.2 pg (ref 26.0–34.0)
MCHC: 33.8 g/dL (ref 30.0–36.0)
MCV: 92.5 fL (ref 80.0–100.0)
Monocytes Absolute: 0.6 10*3/uL (ref 0.1–1.0)
Monocytes Relative: 11 %
Neutro Abs: 3.7 10*3/uL (ref 1.7–7.7)
Neutrophils Relative %: 60 %
Platelets: 209 10*3/uL (ref 150–400)
RBC: 4.13 MIL/uL — ABNORMAL LOW (ref 4.22–5.81)
RDW: 12.6 % (ref 11.5–15.5)
WBC: 6.1 10*3/uL (ref 4.0–10.5)
nRBC: 0 % (ref 0.0–0.2)

## 2023-03-11 LAB — URINALYSIS, ROUTINE W REFLEX MICROSCOPIC
Bilirubin Urine: NEGATIVE
Glucose, UA: NEGATIVE mg/dL
Hgb urine dipstick: NEGATIVE
Ketones, ur: NEGATIVE mg/dL
Leukocytes,Ua: NEGATIVE
Nitrite: NEGATIVE
Protein, ur: NEGATIVE mg/dL
Specific Gravity, Urine: 1.024 (ref 1.005–1.030)
pH: 5 (ref 5.0–8.0)

## 2023-03-11 LAB — LIPASE, BLOOD: Lipase: 19 U/L (ref 11–51)

## 2023-03-11 MED ORDER — BENZONATATE 100 MG PO CAPS: 200.0000 mg | ORAL_CAPSULE | Freq: Three times a day (TID) | ORAL | 0 refills | Status: DC | PRN

## 2023-03-11 MED ORDER — PREDNISONE 50 MG PO TABS: ORAL_TABLET | ORAL | 0 refills | Status: DC

## 2023-03-11 MED ORDER — BENZONATATE 100 MG PO CAPS
200.0000 mg | ORAL_CAPSULE | Freq: Once | ORAL | Status: AC
  Administered 2023-03-11: 200 mg via ORAL
  Filled 2023-03-11: qty 2

## 2023-03-11 MED ORDER — ONDANSETRON 4 MG PO TBDP
4.0000 mg | ORAL_TABLET | Freq: Once | ORAL | Status: AC
  Administered 2023-03-11: 4 mg via ORAL
  Filled 2023-03-11: qty 1

## 2023-03-11 MED ORDER — PREDNISONE 50 MG PO TABS
60.0000 mg | ORAL_TABLET | Freq: Once | ORAL | Status: AC
  Administered 2023-03-11: 60 mg via ORAL
  Filled 2023-03-11: qty 1

## 2023-03-11 NOTE — ED Provider Notes (Signed)
[place] EMERGENCY DEPARTMENT AT [facility] Provider Note   CSN: [id number] Arrival date & time: 03/11/23  1138     History  Chief Complaint  Patient presents with   Cough    [name] is a 62 y.o. 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  His cough has been nonproductive, gets worse at night, he denies shortness of breath, chest pain, he does endorse nausea and reduced appetite intake.  No abdominal pain however states he was diagnosed with cirrhosis in his 20's as he used to be a heavy drinker, he has generalized fatigue and has had no p.o. intake in the past 48 hours secondary to nausea.  He was seen by his PCP 2 days ago, started on a Z-Pak, took the first dose but it made him increasingly nauseated so did not continue taking.  He was sent by his primary doctor for an outpatient chest x-ray today, and while here decided to check in to the ED.  He is not COVID or flu vaccinated.  He denies wheezing but does have an albuterol inhaler which she has tried using without improvement in his cough.  He also states he has the medication for a nebulizer machine but he is still waiting for a nebulizer machine through his PCP.  The history is provided by the patient.       Home Medications Prior to Admission medications   Medication Sig Start Date End Date Taking? Authorizing Provider  benzonatate (TESSALON) 100 MG capsule Take 2 capsules (200 mg total) by mouth 3 (three) times daily as needed. 03/11/23  Yes Marc Sivertsen, Raynelle Fanning, PA-C  predniSONE (DELTASONE) 50 MG tablet Take on tablet daily for 4 days. 03/11/23  Yes Lahari Suttles, Raynelle Fanning, PA-C  albuterol (VENTOLIN HFA) 108 (90 Base) MCG/ACT  inhaler Inhale 1-2 puffs into the lungs every 6 (six) hours as needed for wheezing or shortness of breath. 06/07/22   Small, Brooke L, PA  alfuzosin (UROXATRAL) 10 MG 24 hr tablet Take 1 tablet (10 mg total) by mouth at bedtime. 12/08/22   McKenzie, Mardene Celeste, MD  atorvastatin (LIPITOR) 10 MG tablet Take 1 tablet by mouth daily. 06/11/21   [provider]  azithromycin (ZITHROMAX) 250 MG tablet Take 1 tablet (250 mg total) by mouth daily. Take first 2 tablets together, then 1 every day until finished. Patient not taking: Reported on 09/04/2022 06/30/22   Maryanna Shape A, PA-C  cephALEXin (KEFLEX) 500 MG capsule Take 1 capsule (500 mg total) by mouth 4 (four) times daily. Patient not taking: Reported on 06/25/2022 06/07/22   Small, Brooke L, PA  doxycycline (VIBRAMYCIN) 100 MG capsule Take 1 capsule (100 mg total) by mouth 2 (two) times daily. Patient not taking: Reported on 06/25/2022 06/07/22   Small, Brooke L, PA  meclizine (ANTIVERT) 25 MG tablet Take 25 mg by mouth 3 (three) times daily. Patient not taking: Reported on 06/25/2022 05/06/22   [provider]  omeprazole (PRILOSEC) 40 MG capsule Take 40 mg by mouth daily. 04/21/22   [provider]  ondansetron (ZOFRAN-ODT) 8 MG disintegrating tablet Take 8 mg by mouth every 6 (six) hours. Patient not taking: Reported on 06/25/2022 01/21/22   [provider]  rivaroxaban (XARELTO) 20 MG TABS tablet Take 20 mg by mouth daily.    [provider]  triamterene-hydrochlorothiazide (MAXZIDE-25) 37.5-25 MG tablet Take 0.5 tablets by mouth daily. 05/06/22   [provider]      Allergies    Patient has no known allergies.    Review of Systems   Review of Systems  Constitutional:  Positive for appetite change and fatigue. Negative for fever.  HENT:  Negative for congestion and sore throat.   Eyes: Negative.   Respiratory:  Positive for cough and shortness of breath. Negative for chest tightness.    Cardiovascular:  Negative for chest pain.  Gastrointestinal:  Positive for nausea. Negative for abdominal pain and vomiting.  Genitourinary: Negative.   Musculoskeletal:  Negative for arthralgias, joint swelling and neck pain.  Skin: Negative.  Negative for rash and wound.  Neurological:  Negative for dizziness, weakness, light-headedness, numbness and headaches.  Psychiatric/Behavioral: Negative.      Physical Exam Updated Vital Signs BP 104/80   Pulse 69   Temp 98.3 F (36.8 C) (Oral)   Resp 20   Ht 6\' 3"  (1.905 m)   Wt 90.7 kg   SpO2 100%   BMI 25.00 kg/m  Physical Exam Vitals and nursing note reviewed.  Constitutional:      Appearance: He is well-developed.  HENT:     Head: Normocephalic and atraumatic.  Eyes:     Conjunctiva/sclera: Conjunctivae normal.  Cardiovascular:     Rate and Rhythm: Normal rate and regular rhythm.     Heart sounds: Normal heart sounds.  Pulmonary:     Effort: Pulmonary effort is normal. No respiratory distress.     Breath sounds: Normal breath sounds. No wheezing or rhonchi.  Abdominal:     General: Bowel sounds are normal.     Palpations: Abdomen is soft.     Tenderness: There is abdominal tenderness in the epigastric area. There is no guarding or rebound.  Musculoskeletal:        General: Normal range of motion.     Cervical back: Normal range of motion.  Skin:    General: Skin is warm and dry.  Neurological:     Mental Status: He is alert.     ED Results / Procedures / Treatments   Labs (all labs ordered are listed, but only abnormal results are displayed) Labs Reviewed  CBC WITH DIFFERENTIAL/PLATELET - Abnormal; Notable for the following components:      Result Value   RBC 4.13 (*)    Hemoglobin 12.9 (*)    HCT 38.2 (*)    All other components within normal limits  COMPREHENSIVE METABOLIC PANEL - Abnormal; Notable for the following components:   Potassium 3.4 (*)    Glucose, Bld 123 (*)    BUN 25 (*)    Total Protein  6.4 (*)    All other components within normal limits  RESP PANEL BY RT-PCR (RSV, FLU A&B, COVID)  RVPGX2  LIPASE, BLOOD  URINALYSIS, ROUTINE W REFLEX MICROSCOPIC    EKG EKG Interpretation Date/Time:  Wednesday March 11 2023 12:31:51 EST Ventricular Rate:  87 PR Interval:  142 QRS Duration:  101 QT Interval:  375 QTC Calculation: 452 R Axis:   80  Text Interpretation: Sinus rhythm Ventricular premature complex Confirmed by Vonita Moss [phone]) on 03/11/2023 1:07:18 PM  Radiology DG Chest 2 View  Result Date: 03/11/2023 CLINICAL DATA:  bronchitis, not specified as acute or chronie. EXAM: CHEST - 2 VIEW COMPARISON:  06/30/2022. FINDINGS: Redemonstration of increased interstitial markings, similar to the prior study. No frank pulmonary edema. Bilateral lung fields are otherwise clear. No acute consolidation or lung collapse. Bilateral hilar peribronchial wall thickening is within normal limits. Bilateral costophrenic angles are clear. Normal cardio-mediastinal silhouette. No acute osseous abnormalities. The soft tissues are within normal limits. IMPRESSION: *Chronically increased interstitial markings, nonspecific. No frank pulmonary edema. No active cardiopulmonary disease. Electronically Signed   By: Jules Schick M.D.   On: 03/11/2023 13:34    Procedures Procedures    Medications Ordered in ED Medications  benzonatate (TESSALON) capsule 200 mg (200 mg Oral Given 03/11/23 1627)  ondansetron (ZOFRAN-ODT) disintegrating tablet 4 mg (4 mg Oral Given 03/11/23 1627)  predniSONE (DELTASONE) tablet 60 mg (60 mg Oral Given 03/11/23 1642)    ED Course/ Medical Decision Making/ A&P                                 Medical Decision Making Presenting with 1 week history of URI symptoms including cough, wheezing although he is not wheezing on today's exam, he was examined at 2 different times with no change in his respiratory findings.  Not hypoxic here, his vital signs have been  stable.  Labs are reassuring, chest x-ray which was completed prior to his arrival here also negative for pneumonia.  Given his history of wheezing he is placed on a pulse dose of prednisone, encouraged to continue with his albuterol MDI, he is also given Tessalon for his cough.  He has no chest pain, doubt ACS.  Again his vital signs are stable, although he endorses shortness of breath, there is no pleuritic component, no chest pain, he is compliant with his Xarelto, PE unlikely.  Amount and/or Complexity of Data Reviewed Labs: ordered.    Details: Labs reviewed and are reassuring, his c-Met without significant findings, he has a normal WBC count at 6.1.  His respiratory panel is negative. Radiology:     Details: Chest x-ray ordered by his PCP and completed as an outpatient today with was reviewed, no pneumonia, he does have chronic scarring which he states he was told when he had a pulmonology consult several months ago.  Risk Prescription drug management.           Final Clinical Impression(s) / ED Diagnoses Final diagnoses:  Viral URI with cough    Rx / DC Orders ED Discharge Orders          Ordered    benzonatate (TESSALON) 100 MG capsule  3 times daily PRN        03/11/23 1746    predniSONE (DELTASONE) 50 MG tablet        03/11/23 1746              Burgess Amor, PA-C 03/11/23 1752    Rondel Baton, MD 03/11/23 2023

## 2023-03-11 NOTE — Discharge Instructions (Signed)
Your labs and chest x-ray today are reassuring.  You are being placed on prednisone to help you with your shortness of breath and wheezing, I recommend continuing to use your albuterol inhaler as needed for any wheezing which may return.  Take your next dose of prednisone tomorrow as you have received today's dose while here.  Of also prescribed you some cough medicine to help with the symptom.

## 2023-03-11 NOTE — ED Notes (Signed)
Pt ambulated in room. O2 sats maintained 94-96% on room air.

## 2023-03-11 NOTE — ED Notes (Signed)
Pt eating sandwich at this time.

## 2023-03-11 NOTE — ED Notes (Signed)
Pt stood up to use urinal and assisted back to bed.

## 2023-03-11 NOTE — ED Triage Notes (Signed)
Pt reports cough x 1 week and was trying breathing treatments but did not feel much relief.  He went back to his doctor and was started on a zpack on Monday and they sent him to this facility for out patient cxr today and he decided to check into the ER.

## 2023-06-08 ENCOUNTER — Inpatient Hospital Stay (HOSPITAL_COMMUNITY)
Admission: EM | Admit: 2023-06-08 | Discharge: 2023-06-10 | DRG: 193 | Disposition: A | Discharge status: Home / Self Care | Payer: Medicare HMO | Attending: Internal Medicine | Admitting: Internal Medicine

## 2023-06-08 ENCOUNTER — Other Ambulatory Visit: Payer: Self-pay

## 2023-06-08 ENCOUNTER — Emergency Department (HOSPITAL_COMMUNITY): Payer: Medicare HMO

## 2023-06-08 DIAGNOSIS — K219 Gastro-esophageal reflux disease without esophagitis: Secondary | ICD-10-CM | POA: Diagnosis present

## 2023-06-08 DIAGNOSIS — F17210 Nicotine dependence, cigarettes, uncomplicated: Secondary | ICD-10-CM | POA: Diagnosis present

## 2023-06-08 DIAGNOSIS — Z86711 Personal history of pulmonary embolism: Secondary | ICD-10-CM

## 2023-06-08 DIAGNOSIS — F1120 Opioid dependence, uncomplicated: Secondary | ICD-10-CM | POA: Diagnosis present

## 2023-06-08 DIAGNOSIS — Z79899 Other long term (current) drug therapy: Secondary | ICD-10-CM

## 2023-06-08 DIAGNOSIS — J9621 Acute and chronic respiratory failure with hypoxia: Secondary | ICD-10-CM | POA: Diagnosis present

## 2023-06-08 DIAGNOSIS — D6859 Other primary thrombophilia: Secondary | ICD-10-CM | POA: Diagnosis present

## 2023-06-08 DIAGNOSIS — J111 Influenza due to unidentified influenza virus with other respiratory manifestations: Principal | ICD-10-CM

## 2023-06-08 DIAGNOSIS — R300 Dysuria: Secondary | ICD-10-CM | POA: Diagnosis present

## 2023-06-08 DIAGNOSIS — J1001 Influenza due to other identified influenza virus with the same other identified influenza virus pneumonia: Principal | ICD-10-CM | POA: Diagnosis present

## 2023-06-08 DIAGNOSIS — J9601 Acute respiratory failure with hypoxia: Secondary | ICD-10-CM | POA: Diagnosis present

## 2023-06-08 DIAGNOSIS — J101 Influenza due to other identified influenza virus with other respiratory manifestations: Secondary | ICD-10-CM | POA: Diagnosis present

## 2023-06-08 DIAGNOSIS — Z8249 Family history of ischemic heart disease and other diseases of the circulatory system: Secondary | ICD-10-CM

## 2023-06-08 DIAGNOSIS — J189 Pneumonia, unspecified organism: Secondary | ICD-10-CM | POA: Diagnosis present

## 2023-06-08 DIAGNOSIS — G8929 Other chronic pain: Secondary | ICD-10-CM | POA: Diagnosis present

## 2023-06-08 DIAGNOSIS — F319 Bipolar disorder, unspecified: Secondary | ICD-10-CM | POA: Diagnosis present

## 2023-06-08 DIAGNOSIS — J441 Chronic obstructive pulmonary disease with (acute) exacerbation: Secondary | ICD-10-CM | POA: Diagnosis present

## 2023-06-08 DIAGNOSIS — Z7901 Long term (current) use of anticoagulants: Secondary | ICD-10-CM

## 2023-06-08 DIAGNOSIS — Z833 Family history of diabetes mellitus: Secondary | ICD-10-CM

## 2023-06-08 DIAGNOSIS — Z1152 Encounter for screening for COVID-19: Secondary | ICD-10-CM

## 2023-06-08 DIAGNOSIS — Z9049 Acquired absence of other specified parts of digestive tract: Secondary | ICD-10-CM

## 2023-06-08 DIAGNOSIS — F209 Schizophrenia, unspecified: Secondary | ICD-10-CM | POA: Diagnosis present

## 2023-06-08 DIAGNOSIS — F419 Anxiety disorder, unspecified: Secondary | ICD-10-CM | POA: Diagnosis present

## 2023-06-08 DIAGNOSIS — J440 Chronic obstructive pulmonary disease with acute lower respiratory infection: Secondary | ICD-10-CM | POA: Diagnosis present

## 2023-06-08 LAB — CBC
HCT: 39.5 % (ref 39.0–52.0)
Hemoglobin: 13.2 g/dL (ref 13.0–17.0)
MCH: 31.3 pg (ref 26.0–34.0)
MCHC: 33.4 g/dL (ref 30.0–36.0)
MCV: 93.6 fL (ref 80.0–100.0)
Platelets: 120 10*3/uL — ABNORMAL LOW (ref 150–400)
RBC: 4.22 MIL/uL (ref 4.22–5.81)
RDW: 13.2 % (ref 11.5–15.5)
WBC: 4 10*3/uL (ref 4.0–10.5)
nRBC: 0 % (ref 0.0–0.2)

## 2023-06-08 LAB — BASIC METABOLIC PANEL
Anion gap: 8 (ref 5–15)
BUN: 21 mg/dL (ref 8–23)
CO2: 26 mmol/L (ref 22–32)
Calcium: 8.9 mg/dL (ref 8.9–10.3)
Chloride: 100 mmol/L (ref 98–111)
Creatinine, Ser: 0.91 mg/dL (ref 0.61–1.24)
GFR, Estimated: >60 mL/min (ref >60)
Glucose, Bld: 95 mg/dL (ref 70–99)
Potassium: 3.9 mmol/L (ref 3.5–5.1)
Sodium: 134 mmol/L — ABNORMAL LOW (ref 135–145)

## 2023-06-08 MED ORDER — SODIUM CHLORIDE 0.9 % IV SOLN
500.0000 mg | Freq: Once | INTRAVENOUS | Status: AC
  Administered 2023-06-09: 500 mg via INTRAVENOUS
  Filled 2023-06-08: qty 5

## 2023-06-08 MED ORDER — ACETAMINOPHEN 325 MG PO TABS
650.0000 mg | ORAL_TABLET | Freq: Once | ORAL | Status: AC | PRN
  Administered 2023-06-08: 650 mg via ORAL
  Filled 2023-06-08: qty 2

## 2023-06-08 MED ORDER — LACTATED RINGERS IV SOLN: INTRAVENOUS | Status: AC

## 2023-06-08 MED ORDER — CEFTRIAXONE SODIUM 2 G IJ SOLR
2.0000 g | Freq: Once | INTRAMUSCULAR | Status: AC
  Administered 2023-06-09: 2 g via INTRAVENOUS
  Filled 2023-06-08: qty 20

## 2023-06-08 NOTE — ED Notes (Signed)
 Pt oxygen found to be 79% on RA, pt place on [facility]

## 2023-06-08 NOTE — ED Notes (Signed)
 Patient transported to X-ray

## 2023-06-08 NOTE — ED Triage Notes (Signed)
 Pt presents with generalized illness, dizziness x a week.  Hx of frequent pna.  Fevers at home.

## 2023-06-09 ENCOUNTER — Encounter (HOSPITAL_COMMUNITY): Payer: Self-pay | Admitting: Internal Medicine

## 2023-06-09 DIAGNOSIS — J441 Chronic obstructive pulmonary disease with (acute) exacerbation: Secondary | ICD-10-CM | POA: Diagnosis present

## 2023-06-09 DIAGNOSIS — K219 Gastro-esophageal reflux disease without esophagitis: Secondary | ICD-10-CM | POA: Diagnosis present

## 2023-06-09 DIAGNOSIS — Z7901 Long term (current) use of anticoagulants: Secondary | ICD-10-CM | POA: Diagnosis not present

## 2023-06-09 DIAGNOSIS — J189 Pneumonia, unspecified organism: Secondary | ICD-10-CM | POA: Diagnosis not present

## 2023-06-09 DIAGNOSIS — F419 Anxiety disorder, unspecified: Secondary | ICD-10-CM | POA: Diagnosis present

## 2023-06-09 DIAGNOSIS — R300 Dysuria: Secondary | ICD-10-CM | POA: Diagnosis present

## 2023-06-09 DIAGNOSIS — J101 Influenza due to other identified influenza virus with other respiratory manifestations: Secondary | ICD-10-CM | POA: Diagnosis present

## 2023-06-09 DIAGNOSIS — F209 Schizophrenia, unspecified: Secondary | ICD-10-CM | POA: Diagnosis present

## 2023-06-09 DIAGNOSIS — J1001 Influenza due to other identified influenza virus with the same other identified influenza virus pneumonia: Secondary | ICD-10-CM | POA: Diagnosis present

## 2023-06-09 DIAGNOSIS — Z1152 Encounter for screening for COVID-19: Secondary | ICD-10-CM | POA: Diagnosis not present

## 2023-06-09 DIAGNOSIS — J9621 Acute and chronic respiratory failure with hypoxia: Secondary | ICD-10-CM | POA: Diagnosis present

## 2023-06-09 DIAGNOSIS — J09X1 Influenza due to identified novel influenza A virus with pneumonia: Secondary | ICD-10-CM | POA: Diagnosis not present

## 2023-06-09 DIAGNOSIS — D6859 Other primary thrombophilia: Secondary | ICD-10-CM | POA: Diagnosis present

## 2023-06-09 DIAGNOSIS — Z79899 Other long term (current) drug therapy: Secondary | ICD-10-CM | POA: Diagnosis not present

## 2023-06-09 DIAGNOSIS — J440 Chronic obstructive pulmonary disease with acute lower respiratory infection: Secondary | ICD-10-CM | POA: Diagnosis present

## 2023-06-09 DIAGNOSIS — G8929 Other chronic pain: Secondary | ICD-10-CM | POA: Diagnosis present

## 2023-06-09 DIAGNOSIS — F17210 Nicotine dependence, cigarettes, uncomplicated: Secondary | ICD-10-CM | POA: Diagnosis present

## 2023-06-09 DIAGNOSIS — Z833 Family history of diabetes mellitus: Secondary | ICD-10-CM | POA: Diagnosis not present

## 2023-06-09 DIAGNOSIS — Z86711 Personal history of pulmonary embolism: Secondary | ICD-10-CM | POA: Diagnosis not present

## 2023-06-09 DIAGNOSIS — Z8249 Family history of ischemic heart disease and other diseases of the circulatory system: Secondary | ICD-10-CM | POA: Diagnosis not present

## 2023-06-09 DIAGNOSIS — F319 Bipolar disorder, unspecified: Secondary | ICD-10-CM | POA: Diagnosis present

## 2023-06-09 DIAGNOSIS — Z9049 Acquired absence of other specified parts of digestive tract: Secondary | ICD-10-CM | POA: Diagnosis not present

## 2023-06-09 DIAGNOSIS — F1120 Opioid dependence, uncomplicated: Secondary | ICD-10-CM | POA: Diagnosis present

## 2023-06-09 LAB — URINALYSIS, W/ REFLEX TO CULTURE (INFECTION SUSPECTED)
Bacteria, UA: NONE SEEN
Bilirubin Urine: NEGATIVE
Glucose, UA: NEGATIVE mg/dL
Hgb urine dipstick: NEGATIVE
Ketones, ur: NEGATIVE mg/dL
Leukocytes,Ua: NEGATIVE
Nitrite: NEGATIVE
Protein, ur: 30 mg/dL — AB
Specific Gravity, Urine: 1.027 (ref 1.005–1.030)
pH: 5 (ref 5.0–8.0)

## 2023-06-09 LAB — PROTIME-INR
INR: 1.2 (ref 0.8–1.2)
Prothrombin Time: 15.3 s — ABNORMAL HIGH (ref 11.4–15.2)

## 2023-06-09 LAB — RESP PANEL BY RT-PCR (RSV, FLU A&B, COVID)  RVPGX2
Influenza A by PCR: POSITIVE — AB
Influenza B by PCR: NEGATIVE
Resp Syncytial Virus by PCR: NEGATIVE
SARS Coronavirus 2 by RT PCR: NEGATIVE

## 2023-06-09 LAB — BASIC METABOLIC PANEL
Anion gap: 9 (ref 5–15)
BUN: 16 mg/dL (ref 8–23)
CO2: 24 mmol/L (ref 22–32)
Calcium: 8.2 mg/dL — ABNORMAL LOW (ref 8.9–10.3)
Chloride: 100 mmol/L (ref 98–111)
Creatinine, Ser: 0.74 mg/dL (ref 0.61–1.24)
GFR, Estimated: >60 mL/min (ref >60)
Glucose, Bld: 83 mg/dL (ref 70–99)
Potassium: 3.5 mmol/L (ref 3.5–5.1)
Sodium: 133 mmol/L — ABNORMAL LOW (ref 135–145)

## 2023-06-09 LAB — MAGNESIUM: Magnesium: 1.7 mg/dL (ref 1.7–2.4)

## 2023-06-09 LAB — CBC
HCT: 34.1 % — ABNORMAL LOW (ref 39.0–52.0)
Hemoglobin: 11.1 g/dL — ABNORMAL LOW (ref 13.0–17.0)
MCH: 30.4 pg (ref 26.0–34.0)
MCHC: 32.6 g/dL (ref 30.0–36.0)
MCV: 93.4 fL (ref 80.0–100.0)
Platelets: 88 10*3/uL — ABNORMAL LOW (ref 150–400)
RBC: 3.65 MIL/uL — ABNORMAL LOW (ref 4.22–5.81)
RDW: 13.5 % (ref 11.5–15.5)
WBC: 3.2 10*3/uL — ABNORMAL LOW (ref 4.0–10.5)
nRBC: 0 % (ref 0.0–0.2)

## 2023-06-09 LAB — LACTIC ACID, PLASMA
Lactic Acid, Venous: 0.6 mmol/L (ref 0.5–1.9)
Lactic Acid, Venous: 0.7 mmol/L (ref 0.5–1.9)

## 2023-06-09 LAB — APTT: aPTT: 31 s (ref 24–36)

## 2023-06-09 LAB — HIV ANTIBODY (ROUTINE TESTING W REFLEX): HIV Screen 4th Generation wRfx: NONREACTIVE

## 2023-06-09 LAB — PROCALCITONIN: Procalcitonin: <0.1 ng/mL

## 2023-06-09 MED ORDER — ACETAMINOPHEN 325 MG PO TABS
650.0000 mg | ORAL_TABLET | Freq: Four times a day (QID) | ORAL | Status: DC | PRN
  Administered 2023-06-09: 650 mg via ORAL
  Filled 2023-06-09 (×2): qty 2

## 2023-06-09 MED ORDER — ONDANSETRON HCL 4 MG PO TABS: 4.0000 mg | ORAL_TABLET | Freq: Four times a day (QID) | ORAL | Status: DC | PRN

## 2023-06-09 MED ORDER — ATORVASTATIN CALCIUM 10 MG PO TABS
10.0000 mg | ORAL_TABLET | Freq: Every day | ORAL | Status: DC
  Administered 2023-06-09 – 2023-06-10 (×2): 10 mg via ORAL
  Filled 2023-06-09 (×2): qty 1

## 2023-06-09 MED ORDER — ACETAMINOPHEN 325 MG PO TABS
650.0000 mg | ORAL_TABLET | Freq: Four times a day (QID) | ORAL | Status: DC | PRN
  Administered 2023-06-09 – 2023-06-10 (×2): 650 mg via ORAL
  Filled 2023-06-09: qty 2

## 2023-06-09 MED ORDER — BUDESONIDE 0.25 MG/2ML IN SUSP
0.2500 mg | Freq: Two times a day (BID) | RESPIRATORY_TRACT | Status: DC
  Administered 2023-06-09 – 2023-06-10 (×3): 0.25 mg via RESPIRATORY_TRACT
  Filled 2023-06-09 (×3): qty 2

## 2023-06-09 MED ORDER — PANTOPRAZOLE SODIUM 40 MG PO TBEC
40.0000 mg | DELAYED_RELEASE_TABLET | Freq: Every day | ORAL | Status: DC
  Administered 2023-06-09 – 2023-06-10 (×2): 40 mg via ORAL
  Filled 2023-06-09 (×2): qty 1

## 2023-06-09 MED ORDER — ACETAMINOPHEN 650 MG RE SUPP: 650.0000 mg | Freq: Four times a day (QID) | RECTAL | Status: DC | PRN

## 2023-06-09 MED ORDER — DM-GUAIFENESIN ER 30-600 MG PO TB12
1.0000 | ORAL_TABLET | Freq: Two times a day (BID) | ORAL | Status: DC
  Administered 2023-06-09 – 2023-06-10 (×3): 1 via ORAL
  Filled 2023-06-09 (×3): qty 1

## 2023-06-09 MED ORDER — IPRATROPIUM-ALBUTEROL 0.5-2.5 (3) MG/3ML IN SOLN
3.0000 mL | Freq: Four times a day (QID) | RESPIRATORY_TRACT | Status: DC
  Administered 2023-06-09 – 2023-06-10 (×5): 3 mL via RESPIRATORY_TRACT
  Filled 2023-06-09 (×5): qty 3

## 2023-06-09 MED ORDER — RIVAROXABAN 20 MG PO TABS
20.0000 mg | ORAL_TABLET | Freq: Every day | ORAL | Status: DC
  Administered 2023-06-09 – 2023-06-10 (×2): 20 mg via ORAL
  Filled 2023-06-09 (×2): qty 1

## 2023-06-09 MED ORDER — ACETAMINOPHEN 500 MG PO TABS
1000.0000 mg | ORAL_TABLET | Freq: Once | ORAL | Status: AC
  Administered 2023-06-09: 1000 mg via ORAL
  Filled 2023-06-09: qty 2

## 2023-06-09 MED ORDER — ONDANSETRON HCL 4 MG/2ML IJ SOLN: 4.0000 mg | Freq: Four times a day (QID) | INTRAMUSCULAR | Status: DC | PRN

## 2023-06-09 MED ORDER — METHYLPREDNISOLONE SODIUM SUCC 40 MG IJ SOLR
40.0000 mg | Freq: Two times a day (BID) | INTRAMUSCULAR | Status: DC
  Administered 2023-06-09 – 2023-06-10 (×3): 40 mg via INTRAVENOUS
  Filled 2023-06-09 (×3): qty 1

## 2023-06-09 NOTE — ED Notes (Signed)
 Patient had pulled Oxygent tube off the wall in his sleep. Reconnected oxygen tubing, and bumped oxygen to 4L for desat of 85-86%. Patient currently at 90%.

## 2023-06-09 NOTE — Plan of Care (Signed)
  Problem: Education: Goal: Knowledge of General Education information will improve Description: Including pain rating scale, medication(s)/side effects and non-pharmacologic comfort measures Outcome: Progressing   Problem: Clinical Measurements: Goal: Respiratory complications will improve Outcome: Not Progressing   Problem: Activity: Goal: Risk for activity intolerance will decrease Outcome: Progressing

## 2023-06-09 NOTE — Plan of Care (Signed)
  Problem: Education: Goal: Knowledge of General Education information will improve Description: Including pain rating scale, medication(s)/side effects and non-pharmacologic comfort measures Outcome: Progressing   Problem: Health Behavior/Discharge Planning: Goal: Ability to manage health-related needs will improve Outcome: Progressing   Problem: Clinical Measurements: Goal: Respiratory complications will improve Outcome: Not Progressing

## 2023-06-09 NOTE — H&P (Signed)
 History and Physical    [name]:[id number] DOB: 18-Mar-1961 DOA: 06/08/2023  PCP: Marylynn Pearson, FNP   Patient coming from: Home  Chief Complaint: Fever/dyspnea/cough  HPI: [name] is a 63 y.o. male with medical history significant for bipolar disorder, hepatitis C, asthma, chronic pain, dysphagia and aspiration, COPD, GERD, osteoarthritis, and prior PE who presented to the ED with fever at home as well as decreased oral intake, cough and shortness of breath over the last 1 week.  He has had multiple people at home with the flu and still continues to use cigarettes.  He states he has had a productive cough as well as some dysuria.  He denies any vomiting or diarrhea.   ED Course: Vital signs stable and temperature 102.1 F noted.  Platelet count of 120,000 and sodium of 134.  Flu a positive and some findings of left lower lobe infiltrate on chest x-ray.  Procalcitonin is low.  Urine analysis negative for any acute findings.  Review of Systems: Reviewed as noted above, otherwise negative.  Past Medical History:  Diagnosis Date   Anxiety    Arthritis    Asthma    Bipolar disorder (HCC)    Chronic pain    COPD (chronic obstructive pulmonary disease) (HCC)    Degenerative disk disease    GERD (gastroesophageal reflux disease)    Hiatal hernia    History of pneumonia    PE (pulmonary embolism) 2012   Protein S deficiency (HCC)    Schizophrenia (HCC)     Past Surgical History:  Procedure Laterality Date   CHOLECYSTECTOMY     CLOSED REDUCTION MANDIBLE WITH MANDIBULOMA  10/21/2011   Procedure: CLOSED REDUCTION MANDIBLE WITH MANDIBULOMAXILLARY FUSION;  Surgeon: Darletta Moll, MD;  Location[facility] [place];  Service: ENT;  Laterality: N/A;  MMF screws   HAND SURGERY Right 20 years ago   INCISION AND DRAINAGE OF WOUND Left 08/27/2021   Procedure: IRRIGATION AND DEBRIDEMENT WOUND; left leg;  Surgeon: Lucretia Roers, MD;  Location: [facility];  Service: General;  Laterality: Left;    INCISIONAL HERNIA REPAIR N/A 10/27/2018   Procedure: HERNIA REPAIR INCISIONAL WITH MESH;  Surgeon: Franky Macho, MD;  Location: [facility];  Service: General;  Laterality: N/A;   INCISIONAL HERNIA REPAIR N/A 02/23/2019   Procedure: LAPAROSCOPIC INCISIONAL HERNIA REPAIR WITH MESH;  Surgeon: Franky Macho, MD;  Location: [facility];  Service: General;  Laterality: N/A;   LEG SURGERY     MANDIBULAR HARDWARE REMOVAL  11/11/2011   Procedure: MANDIBULAR HARDWARE REMOVAL;  Surgeon: Darletta Moll, MD;  Location: [facility];  Service: ENT;  Laterality: N/A;   ORIF MANDIBULAR FRACTURE  10/21/2011   Procedure: OPEN REDUCTION INTERNAL FIXATION (ORIF) MANDIBULAR FRACTURE;  Surgeon: Darletta Moll, MD;  Location: [facility];  Service: ENT;  Laterality: N/A;   UMBILICAL HERNIA REPAIR       reports that he has been smoking cigarettes. He has a 8.8 pack-year smoking history. He has never used smokeless tobacco. He reports that he does not currently use drugs after having used the following drugs: Marijuana. He reports that he does not drink alcohol.  No Known Allergies  Family History  Problem Relation Age of Onset   Coronary artery disease Mother    Colon polyps Mother    Coronary artery disease Father    Colon polyps Father    Diabetes Other     Prior to Admission medications   Medication Sig Start Date  End Date Taking? Authorizing Provider  albuterol (VENTOLIN HFA) 108 (90 Base) MCG/ACT inhaler Inhale 1-2 puffs into the lungs every 6 (six) hours as needed for wheezing or shortness of breath. 06/07/22  Yes Small, Brooke L, PA  atorvastatin (LIPITOR) 10 MG tablet Take 1 tablet by mouth daily. 06/11/21  Yes [provider]  omeprazole (PRILOSEC) 40 MG capsule Take 40 mg by mouth daily. 04/21/22  Yes [provider]  rivaroxaban (XARELTO) 20 MG TABS tablet Take 20 mg by mouth daily.   Yes [provider]    Physical Exam: Vitals:   06/09/23 0915 06/09/23 0930 06/09/23 0945 06/09/23  1000  BP: 105/62 103/61 110/66 106/68  Pulse: 77 81 77 73  Resp: (!) 24 (!) 23 (!) 23 (!) 23  Temp:      TempSrc:      SpO2: 92% 92% 93% 94%    Constitutional: NAD, calm, comfortable Vitals:   06/09/23 0915 06/09/23 0930 06/09/23 0945 06/09/23 1000  BP: 105/62 103/61 110/66 106/68  Pulse: 77 81 77 73  Resp: (!) 24 (!) 23 (!) 23 (!) 23  Temp:      TempSrc:      SpO2: 92% 92% 93% 94%   Eyes: lids and conjunctivae normal Neck: normal, supple Respiratory: Mild wheezing bilaterally. Normal respiratory effort. No accessory muscle use.  4 L of nasal cannula oxygen Cardiovascular: Regular rate and rhythm, no murmurs. Abdomen: no tenderness, no distention. Bowel sounds positive.  Musculoskeletal:  No edema. Skin: no rashes, lesions, ulcers.  Psychiatric: Flat affect  Labs on Admission: I have personally reviewed following labs and imaging studies  CBC: Recent Labs  Lab 06/08/23 2304 06/09/23 0729  WBC 4.0 3.2*  HGB 13.2 11.1*  HCT 39.5 34.1*  MCV 93.6 93.4  PLT 120* 88*   Basic Metabolic Panel: Recent Labs  Lab 06/08/23 2304 06/09/23 0729  NA 134* 133*  K 3.9 3.5  CL 100 100  CO2 26 24  GLUCOSE 95 83  BUN 21 16  CREATININE 0.91 0.74  CALCIUM 8.9 8.2*  MG  --  1.7   GFR: CrCl cannot be calculated (Unknown ideal weight.). Liver Function Tests: No results for input(s): "AST", "ALT", "ALKPHOS", "BILITOT", "PROT", "ALBUMIN" in the last 168 hours. No results for input(s): "LIPASE", "AMYLASE" in the last 168 hours. No results for input(s): "AMMONIA" in the last 168 hours. Coagulation Profile: Recent Labs  Lab 06/08/23 2304  INR 1.2   Cardiac Enzymes: No results for input(s): "CKTOTAL", "CKMB", "CKMBINDEX", "TROPONINI" in the last 168 hours. BNP (last 3 results) No results for input(s): "PROBNP" in the last 8760 hours. HbA1C: No results for input(s): "HGBA1C" in the last 72 hours. CBG: No results for input(s): "GLUCAP" in the last 168 hours. Lipid  Profile: No results for input(s): "CHOL", "HDL", "LDLCALC", "TRIG", "CHOLHDL", "LDLDIRECT" in the last 72 hours. Thyroid Function Tests: No results for input(s): "TSH", "T4TOTAL", "FREET4", "T3FREE", "THYROIDAB" in the last 72 hours. Anemia Panel: No results for input(s): "VITAMINB12", "FOLATE", "FERRITIN", "TIBC", "IRON", "RETICCTPCT" in the last 72 hours. Urine analysis:    Component Value Date/Time   COLORURINE YELLOW 06/09/2023 0258   APPEARANCEUR CLEAR 06/09/2023 0258   APPEARANCEUR Clear 12/08/2022 1510   LABSPEC 1.027 06/09/2023 0258   PHURINE 5.0 06/09/2023 0258   GLUCOSEU NEGATIVE 06/09/2023 0258   HGBUR NEGATIVE 06/09/2023 0258   BILIRUBINUR NEGATIVE 06/09/2023 0258   BILIRUBINUR Negative 12/08/2022 1510   KETONESUR NEGATIVE 06/09/2023 0258   PROTEINUR 30 (A) 06/09/2023 0258  UROBILINOGEN 1.0 05/07/2022 1212   UROBILINOGEN 1.0 02/05/2015 1915   NITRITE NEGATIVE 06/09/2023 0258   LEUKOCYTESUR NEGATIVE 06/09/2023 0258    Radiological Exams on Admission: DG Chest 2 View Result Date: 06/09/2023 CLINICAL DATA:  Shortness of breath EXAM: CHEST - 2 VIEW COMPARISON:  Chest x-ray 03/11/2023 FINDINGS: There some patchy left lower lobe airspace disease. There is no pleural effusion or pneumothorax. The cardiomediastinal silhouette is within normal limits. No acute fractures are seen. IMPRESSION: Patchy left lower lobe airspace disease concerning for pneumonia. Follow-up chest x-ray recommended in 4-6 weeks to confirm resolution. Electronically Signed   By: Darliss Cheney M.D.   On: 06/09/2023 00:38    EKG: Independently reviewed.  SR 84 bpm with PVCs  Assessment/Plan Active Problems:   GERD (gastroesophageal reflux disease)   Hx of pulmonary embolus   COPD with acute exacerbation (HCC)   Anxiety   Acute hypoxemic respiratory failure (HCC)   Protein S deficiency (HCC)   Chronic pain/Opioid dependence   Influenza A    Mild acute hypoxemic respiratory failure secondary to  Influenza A pneumonia with COPD exacerbation -Patient noted to have symptoms for the last 1 week, Tamiflu not indicated -Start DuoNebs and Pulmicort twice daily as well as Solu-Medrol twice daily -Wean oxygen to room air as tolerated -Continue symptomatic management and maintain on contact precautions  History of chronic pain/opioid dependence -Continue pain management as ordered  Protein S deficiency/history of pulmonary embolus -Continue home Xarelto  Anxiety -Continue Xanax as needed  GERD -PPI  Ongoing tobacco abuse -Counseled on cessation   DVT prophylaxis: Xarelto Code Status: Full Family Communication: None at bedside Disposition Plan: Admit for treatment of influenza A as well as COPD exacerbation Consults called: None Admission status: Observation, telemetry  Janathan Bribiesca D Naureen Benton DO [facility]  If 7PM-7AM, please contact night-coverage [email or web address]  06/09/2023, 10:07 AM

## 2023-06-09 NOTE — ED Provider Notes (Signed)
 [place] EMERGENCY DEPARTMENT AT [facility] Provider Note   CSN: [id number] Arrival date & time: 06/08/23  2220     History  Chief Complaint  Patient presents with   Dizziness    [name] is a 63 y.o. male.  63 yo M w/ h/o COPD here with generalized weakness. Has belt bad for about a week. Fever, decreased intake, cough, sob. Multiple people in household with flu. Still smokes. Has had productive cough. Foul smelling urine and dysuria. No vomiting/diarrhea/rash.    Dizziness      Home Medications Prior to Admission medications   Medication Sig Start Date End Date Taking? Authorizing Provider  albuterol (VENTOLIN HFA) 108 (90 Base) MCG/ACT inhaler Inhale 1-2 puffs into the lungs every 6 (six) hours as needed for wheezing or shortness of breath. 06/07/22   Small, Brooke L, PA  alfuzosin (UROXATRAL) 10 MG 24 hr tablet Take 1 tablet (10 mg total) by mouth at bedtime. 12/08/22   McKenzie, Mardene Celeste, MD  atorvastatin (LIPITOR) 10 MG tablet Take 1 tablet by mouth daily. 06/11/21   [provider]  azithromycin (ZITHROMAX) 250 MG tablet Take 1 tablet (250 mg total) by mouth daily. Take first 2 tablets together, then 1 every day until finished. Patient not taking: Reported on 09/04/2022 06/30/22   Smitty Knudsen, PA-C  benzonatate (TESSALON) 100 MG capsule Take 2 capsules (200 mg total) by mouth 3 (three) times daily as needed. 03/11/23   Idol, Raynelle Fanning, PA-C  cephALEXin (KEFLEX) 500 MG capsule Take 1 capsule (500 mg total) by mouth 4 (four) times daily. Patient not taking: Reported on 06/25/2022 06/07/22   Small, Brooke L, PA  doxycycline (VIBRAMYCIN) 100 MG capsule Take 1 capsule (100 mg total) by mouth 2 (two) times daily. Patient not taking: Reported on 06/25/2022 06/07/22   Small, Brooke L, PA  meclizine (ANTIVERT) 25 MG tablet Take 25 mg by mouth 3 (three) times daily. Patient not taking: Reported on 06/25/2022 05/06/22   [provider]  omeprazole  (PRILOSEC) 40 MG capsule Take 40 mg by mouth daily. 04/21/22   [provider]  ondansetron (ZOFRAN-ODT) 8 MG disintegrating tablet Take 8 mg by mouth every 6 (six) hours. Patient not taking: Reported on 06/25/2022 01/21/22   [provider]  predniSONE (DELTASONE) 50 MG tablet Take on tablet daily for 4 days. 03/11/23   Burgess Amor, PA-C  rivaroxaban (XARELTO) 20 MG TABS tablet Take 20 mg by mouth daily.    [provider]  triamterene-hydrochlorothiazide (MAXZIDE-25) 37.5-25 MG tablet Take 0.5 tablets by mouth daily. 05/06/22   [provider]      Allergies    Patient has no known allergies.    Review of Systems   Review of Systems  Neurological:  Positive for dizziness.    Physical Exam Updated Vital Signs BP 116/66   Pulse 72   Temp 98.8 F (37.1 C) (Oral)   Resp (!) 27   SpO2 95%  Physical Exam Vitals and nursing note reviewed.  Constitutional:      Appearance: He is well-developed.  HENT:     Head: Normocephalic and atraumatic.  Cardiovascular:     Rate and Rhythm: Tachycardia present.  Pulmonary:     Effort: Pulmonary effort is normal. Tachypnea present. No respiratory distress.     Breath sounds: Decreased air movement present. Decreased breath sounds and wheezing present.  Abdominal:     General: There is no distension.  Musculoskeletal:  General: Normal range of motion.     Cervical back: Normal range of motion.  Neurological:     Mental Status: He is alert.     ED Results / Procedures / Treatments   Labs (all labs ordered are listed, but only abnormal results are displayed) Labs Reviewed  RESP PANEL BY RT-PCR (RSV, FLU A&B, COVID)  [id number] - Abnormal; Notable for the following components:      Result Value   Influenza A by PCR POSITIVE (*)    All other components within normal limits  BASIC METABOLIC PANEL - Abnormal; Notable for the following components:   Sodium 134 (*)    All other components within normal  limits  CBC - Abnormal; Notable for the following components:   Platelets 120 (*)    All other components within normal limits  PROTIME-INR - Abnormal; Notable for the following components:   Prothrombin Time 15.3 (*)    All other components within normal limits  URINALYSIS, W/ REFLEX TO CULTURE (INFECTION SUSPECTED) - Abnormal; Notable for the following components:   Protein, ur 30 (*)    All other components within normal limits  CULTURE, BLOOD (ROUTINE X 2)  CULTURE, BLOOD (ROUTINE X 2)  LACTIC ACID, PLASMA  LACTIC ACID, PLASMA  APTT    EKG None  Radiology DG Chest 2 View Result Date: 06/09/2023 CLINICAL DATA:  Shortness of breath EXAM: CHEST - 2 VIEW COMPARISON:  Chest x-ray 03/11/2023 FINDINGS: There some patchy left lower lobe airspace disease. There is no pleural effusion or pneumothorax. The cardiomediastinal silhouette is within normal limits. No acute fractures are seen. IMPRESSION: Patchy left lower lobe airspace disease concerning for pneumonia. Follow-up chest x-ray recommended in 4-6 weeks to confirm resolution. Electronically Signed   By: Darliss Cheney M.D.   On: 06/09/2023 00:38    Procedures .Critical Care  Performed by: Marily Memos, MD Authorized by: Marily Memos, MD   Critical care provider statement:    Critical care time (minutes):  30   Critical care was necessary to treat or prevent imminent or life-threatening deterioration of the following conditions:  Sepsis and respiratory failure   Critical care was time spent personally by me on the following activities:  Development of treatment plan with patient or surrogate, discussions with consultants, evaluation of patient's response to treatment, examination of patient, ordering and review of laboratory studies, ordering and review of radiographic studies, ordering and performing treatments and interventions, pulse oximetry, re-evaluation of patient's condition and review of old charts     Medications  Ordered in ED Medications  lactated ringers infusion ( Intravenous New Bag/Given 06/09/23 0437)  acetaminophen (TYLENOL) tablet 650 mg (has no administration in time range)  acetaminophen (TYLENOL) tablet 650 mg (650 mg Oral Given 06/08/23 2331)  cefTRIAXone (ROCEPHIN) 2 g in sodium chloride 0.9 % 100 mL IVPB (0 g Intravenous Stopped 06/09/23 0041)  azithromycin (ZITHROMAX) 500 mg in sodium chloride 0.9 % 250 mL IVPB (0 mg Intravenous Stopped 06/09/23 0140)  acetaminophen (TYLENOL) tablet 1,000 mg (1,000 mg Oral Given 06/09/23 0040)    ED Course/ Medical Decision Making/ A&P                                 Medical Decision Making Amount and/or Complexity of Data Reviewed Labs: ordered. Radiology: ordered.  Risk OTC drugs. Prescription drug management. Decision regarding hospitalization.   Ehre with acute on chronic hypoxic respiratory failure likely related  to post viral pneumonia. Sepsis order set initiated immediately upon evaluation with CAP abx ordered. Fluids ordered but BP/LA normally so no 30 cc/kg. Has been sick for >48 hours so no tamiflu. 4L O2 and sats appropriate. Cxr with LLL consolidation on my personal viewing and interpretation, rads read reviewed. Discussed with Dr. Thomes Dinning with [facility] for admission.   Final Clinical Impression(s) / ED Diagnoses Final diagnoses:  Influenza  Community acquired pneumonia of left lower lobe of lung  Acute on chronic respiratory failure with hypoxia [facility])    Rx / DC Orders ED Discharge Orders     None         Ercelle Winkles, Barbara Cower, MD 06/09/23 [phone]

## 2023-06-09 NOTE — ED Notes (Signed)
Lab at bedside for blood cultures,  

## 2023-06-09 NOTE — ED Notes (Signed)
 Both sets of blood cultures collected before antibiotic administration

## 2023-06-09 NOTE — ED Notes (Signed)
 ED Provider at bedside.

## 2023-06-09 NOTE — Progress Notes (Signed)
 Elink monitoring for the code sepsis protocol.

## 2023-06-09 NOTE — ED Notes (Signed)
 ED TO INPATIENT HANDOFF REPORT  ED Nurse Name and Phone #: Haig Prophet. RN   S Name/Age/Gender [name] 63 y.o. male Room/Bed: APA06/APA06  Code Status   Code Status: Prior  Home/SNF/Other Home Patient oriented to: self, place, time, and situation Is this baseline? Yes   Triage Complete: Triage complete  Chief Complaint CAP (community acquired pneumonia) [J18.9]  Triage Note Pt presents with generalized illness, dizziness x a week.  Hx of frequent pna.  Fevers at home.    Allergies No Known Allergies  Level of Care/Admitting Diagnosis ED Disposition     ED Disposition  Admit   Condition  --   Comment  Hospital Area: [facility] [place] [[id number]]  Level of Care: Med-Surg [16]  Covid Evaluation: Asymptomatic - no recent exposure (last 10 days) testing not required  Diagnosis: CAP (community acquired pneumonia) [102725]  Admitting Physician: Frankey Shown [[id number]]  Attending Physician: Frankey Shown [[id number]]  Certification:: I certify this patient will need inpatient services for at least 2 midnights  Expected Medical Readiness: 06/12/2023          B Medical/Surgery History Past Medical History:  Diagnosis Date   Anxiety    Arthritis    Asthma    Bipolar disorder (HCC)    Chronic pain    COPD (chronic obstructive pulmonary disease) (HCC)    Degenerative disk disease    GERD (gastroesophageal reflux disease)    Hiatal hernia    History of pneumonia    PE (pulmonary embolism) 2012   Protein S deficiency (HCC)    Schizophrenia (HCC)    Past Surgical History:  Procedure Laterality Date   CHOLECYSTECTOMY     CLOSED REDUCTION MANDIBLE WITH MANDIBULOMA  10/21/2011   Procedure: CLOSED REDUCTION MANDIBLE WITH MANDIBULOMAXILLARY FUSION;  Surgeon: Darletta Moll, MD;  Location: [facility] OR;  Service: ENT;  Laterality: N/A;  MMF screws   HAND SURGERY Right 20 years ago   INCISION AND DRAINAGE OF WOUND Left 08/27/2021   Procedure: IRRIGATION AND DEBRIDEMENT  WOUND; left leg;  Surgeon: Lucretia Roers, MD;  Location: [facility];  Service: General;  Laterality: Left;   INCISIONAL HERNIA REPAIR N/A 10/27/2018   Procedure: HERNIA REPAIR INCISIONAL WITH MESH;  Surgeon: Franky Macho, MD;  Location: [facility];  Service: General;  Laterality: N/A;   INCISIONAL HERNIA REPAIR N/A 02/23/2019   Procedure: LAPAROSCOPIC INCISIONAL HERNIA REPAIR WITH MESH;  Surgeon: Franky Macho, MD;  Location: [facility];  Service: General;  Laterality: N/A;   LEG SURGERY     MANDIBULAR HARDWARE REMOVAL  11/11/2011   Procedure: MANDIBULAR HARDWARE REMOVAL;  Surgeon: Darletta Moll, MD;  Location: [facility];  Service: ENT;  Laterality: N/A;   ORIF MANDIBULAR FRACTURE  10/21/2011   Procedure: OPEN REDUCTION INTERNAL FIXATION (ORIF) MANDIBULAR FRACTURE;  Surgeon: Darletta Moll, MD;  Location: [facility] OR;  Service: ENT;  Laterality: N/A;   UMBILICAL HERNIA REPAIR       A IV Location/Drains/Wounds Patient Lines/Drains/Airways Status     Active Line/Drains/Airways     Name Placement date Placement time Site Days   Peripheral IV 06/09/23 18 G 1" Right Antecubital 06/09/23  0012  Antecubital  less than 1   Peripheral IV 06/09/23 18 G 1" Left;Posterior Forearm 06/09/23  0012  Forearm  less than 1            Intake/Output Last 24 hours No intake or output data in the 24 hours ending 06/09/23 0815  Labs/Imaging Results  for orders placed or performed during the hospital encounter of 06/08/23 (from the past 48 hours)  Basic metabolic panel     Status: Abnormal   Collection Time: 06/08/23 11:04 PM  Result Value Ref Range   Sodium 134 (L) 135 - 145 mmol/L   Potassium 3.9 3.5 - 5.1 mmol/L   Chloride 100 98 - 111 mmol/L   CO2 26 22 - 32 mmol/L   Glucose, Bld 95 70 - 99 mg/dL    Comment: Glucose reference range applies only to samples taken after fasting for at least 8 hours.   BUN 21 8 - 23 mg/dL   Creatinine, Ser 7.82 0.61 - 1.24 mg/dL   Calcium 8.9 8.9 - 95.6 mg/dL   GFR,  Estimated >21 >30 mL/min    Comment: (NOTE) Calculated using the CKD-EPI Creatinine Equation (2021)    Anion gap 8 5 - 15    Comment: Performed at [facility], [address] [place], [place] [address]  CBC     Status: Abnormal   Collection Time: 06/08/23 11:04 PM  Result Value Ref Range   WBC 4.0 4.0 - 10.5 K/uL   RBC 4.22 4.22 - 5.81 MIL/uL   Hemoglobin 13.2 13.0 - 17.0 g/dL   HCT 46.9 62.9 - 52.8 %   MCV 93.6 80.0 - 100.0 fL   MCH 31.3 26.0 - 34.0 pg   MCHC 33.4 30.0 - 36.0 g/dL   RDW 41.3 24.4 - 01.0 %   Platelets 120 (L) 150 - 400 K/uL   nRBC 0.0 0.0 - 0.2 %    Comment: Performed at [facility], [address], [place], [place] [address]  Lactic acid, plasma     Status: None   Collection Time: 06/08/23 11:04 PM  Result Value Ref Range   Lactic Acid, Venous 0.6 0.5 - 1.9 mmol/L    Comment: Performed at [facility], [address], [place], [place] [address]  Protime-INR     Status: Abnormal   Collection Time: 06/08/23 11:04 PM  Result Value Ref Range   Prothrombin Time 15.3 (H) 11.4 - 15.2 seconds   INR 1.2 0.8 - 1.2    Comment: (NOTE) INR goal varies based on device and disease states. Performed at [facility], [address] [place], [place] [address]   APTT     Status: None   Collection Time: 06/08/23 11:04 PM  Result Value Ref Range   aPTT 31 24 - 36 seconds    Comment: Performed at [facility], [address], [place], [place] [address]  Resp panel by RT-PCR (RSV, Flu A&B, Covid) Anterior Nasal Swab     Status: Abnormal   Collection Time: 06/08/23 11:09 PM   Specimen: Anterior Nasal Swab  Result Value Ref Range   SARS Coronavirus 2 by RT PCR NEGATIVE NEGATIVE    Comment: (NOTE) SARS-CoV-2 target nucleic acids are NOT DETECTED.  The SARS-CoV-2 RNA is generally detectable in upper respiratory specimens during the acute phase of infection. The lowest concentration of SARS-CoV-2 viral copies this assay can detect is 138 copies/mL. A negative result does not  preclude SARS-Cov-2 infection and should not be used as the sole basis for treatment or other patient management decisions. A negative result may occur with  improper specimen collection/handling, submission of specimen other than nasopharyngeal swab, presence of viral mutation(s) within the areas targeted by this assay, and inadequate number of viral copies(<138 copies/mL). A negative result must be combined with clinical observations, patient history, and epidemiological information.  The expected result is Negative.  Fact Sheet for Patients:  BloggerCourse.com  Fact Sheet for Healthcare Providers:  SeriousBroker.it  This test is no t yet approved or cleared by the [place] FDA and  has been authorized for detection and/or diagnosis of SARS-CoV-2 by FDA under an Emergency Use Authorization (EUA). This EUA will remain  in effect (meaning this test can be used) for the duration of the COVID-19 declaration under Section 564(b)(1) of the Act, 21 U.S.C.section 360bbb-3(b)(1), unless the authorization is terminated  or revoked sooner.       Influenza A by PCR POSITIVE (A) NEGATIVE   Influenza B by PCR NEGATIVE NEGATIVE    Comment: (NOTE) The Xpert Xpress SARS-CoV-2/FLU/RSV plus assay is intended as an aid in the diagnosis of influenza from Nasopharyngeal swab specimens and should not be used as a sole basis for treatment. Nasal washings and aspirates are unacceptable for Xpert Xpress SARS-CoV-2/FLU/RSV testing.  Fact Sheet for Patients: BloggerCourse.com  Fact Sheet for Healthcare Providers: SeriousBroker.it  This test is not yet approved or cleared by the [place] FDA and has been authorized for detection and/or diagnosis of SARS-CoV-2 by FDA under an Emergency Use Authorization (EUA). This EUA will remain in effect (meaning this test can be used) for the duration of  the COVID-19 declaration under Section 564(b)(1) of the Act, 21 U.S.C. section 360bbb-3(b)(1), unless the authorization is terminated or revoked.     Resp Syncytial Virus by PCR NEGATIVE NEGATIVE    Comment: (NOTE) Fact Sheet for Patients: BloggerCourse.com  Fact Sheet for Healthcare Providers: SeriousBroker.it  This test is not yet approved or cleared by the [place] FDA and has been authorized for detection and/or diagnosis of SARS-CoV-2 by FDA under an Emergency Use Authorization (EUA). This EUA will remain in effect (meaning this test can be used) for the duration of the COVID-19 declaration under Section 564(b)(1) of the Act, 21 U.S.C. section 360bbb-3(b)(1), unless the authorization is terminated or revoked.  Performed at [facility], [address], [place], [place] [address]   Blood Culture (routine x 2)     Status: None (Preliminary result)   Collection Time: 06/09/23 12:11 AM   Specimen: Right Antecubital; Blood  Result Value Ref Range   Specimen Description      RIGHT ANTECUBITAL BOTTLES DRAWN AEROBIC AND ANAEROBIC   Special Requests Blood Culture adequate volume    Culture      NO GROWTH < 12 HOURS Performed at [facility], [address] [place], [place] [address]    Report Status PENDING   Lactic acid, plasma     Status: None   Collection Time: 06/09/23 12:13 AM  Result Value Ref Range   Lactic Acid, Venous 0.7 0.5 - 1.9 mmol/L    Comment: Performed at [facility], [address], [place], [place] [address]  Blood Culture (routine x 2)     Status: None (Preliminary result)   Collection Time: 06/09/23 12:13 AM   Specimen: BLOOD LEFT FOREARM  Result Value Ref Range   Specimen Description      BLOOD LEFT FOREARM BOTTLES DRAWN AEROBIC AND ANAEROBIC   Special Requests      Blood Culture results may not be optimal due to an inadequate volume of blood received in culture bottles   Culture      NO GROWTH <  12 HOURS Performed at [facility], [address] [place], [place] [address]    Report Status PENDING   Urinalysis, w/ Reflex to Culture (  Infection Suspected) -Urine, Clean Catch     Status: Abnormal   Collection Time: 06/09/23  2:58 AM  Result Value Ref Range   Specimen Source URINE, CLEAN CATCH    Color, Urine YELLOW YELLOW   APPearance CLEAR CLEAR   Specific Gravity, Urine 1.027 1.005 - 1.030   pH 5.0 5.0 - 8.0   Glucose, UA NEGATIVE NEGATIVE mg/dL   Hgb urine dipstick NEGATIVE NEGATIVE   Bilirubin Urine NEGATIVE NEGATIVE   Ketones, ur NEGATIVE NEGATIVE mg/dL   Protein, ur 30 (A) NEGATIVE mg/dL   Nitrite NEGATIVE NEGATIVE   Leukocytes,Ua NEGATIVE NEGATIVE   RBC / HPF 0-5 0 - 5 RBC/hpf   WBC, UA 0-5 0 - 5 WBC/hpf    Comment:        Reflex urine culture not performed if WBC <=10, OR if Squamous epithelial cells >5. If Squamous epithelial cells >5 suggest recollection.    Bacteria, UA NONE SEEN NONE SEEN   Squamous Epithelial / HPF 0-5 0 - 5 /HPF   Mucus PRESENT     Comment: Performed at [facility], [address], [place], [place] [address]  Basic metabolic panel     Status: Abnormal   Collection Time: 06/09/23  7:29 AM  Result Value Ref Range   Sodium 133 (L) 135 - 145 mmol/L   Potassium 3.5 3.5 - 5.1 mmol/L   Chloride 100 98 - 111 mmol/L   CO2 24 22 - 32 mmol/L   Glucose, Bld 83 70 - 99 mg/dL    Comment: Glucose reference range applies only to samples taken after fasting for at least 8 hours.   BUN 16 8 - 23 mg/dL   Creatinine, Ser 1.91 0.61 - 1.24 mg/dL   Calcium 8.2 (L) 8.9 - 10.3 mg/dL   GFR, Estimated >47 >82 mL/min    Comment: (NOTE) Calculated using the CKD-EPI Creatinine Equation (2021)    Anion gap 9 5 - 15    Comment: Performed at [facility], [address], [place], [place] [address]  Magnesium     Status: None   Collection Time: 06/09/23  7:29 AM  Result Value Ref Range   Magnesium 1.7 1.7 - 2.4 mg/dL    Comment: Performed at [facility],  [address] [place], [place] [address]  CBC     Status: Abnormal   Collection Time: 06/09/23  7:29 AM  Result Value Ref Range   WBC 3.2 (L) 4.0 - 10.5 K/uL   RBC 3.65 (L) 4.22 - 5.81 MIL/uL   Hemoglobin 11.1 (L) 13.0 - 17.0 g/dL   HCT 78.4 (L) 69.6 - 29.5 %   MCV 93.4 80.0 - 100.0 fL   MCH 30.4 26.0 - 34.0 pg   MCHC 32.6 30.0 - 36.0 g/dL   RDW 28.4 13.2 - 44.0 %   Platelets 88 (L) 150 - 400 K/uL    Comment: SPECIMEN CHECKED FOR CLOTS Immature Platelet Fraction may be clinically indicated, consider ordering this additional test [id number] REPEATED TO VERIFY PLATELETS APPEAR DECREASED    nRBC 0.0 0.0 - 0.2 %    Comment: Performed at [facility], [address], [place], [place] [address]   DG Chest 2 View Result Date: 06/09/2023 CLINICAL DATA:  Shortness of breath EXAM: CHEST - 2 VIEW COMPARISON:  Chest x-ray 03/11/2023 FINDINGS: There some patchy left lower lobe airspace disease. There is no pleural effusion or pneumothorax. The cardiomediastinal silhouette is within normal limits. No acute fractures are seen. IMPRESSION: Patchy left lower lobe  airspace disease concerning for pneumonia. Follow-up chest x-ray recommended in 4-6 weeks to confirm resolution. Electronically Signed   By: Darliss Cheney M.D.   On: 06/09/2023 00:38    Pending Labs Unresulted Labs (From admission, onward)     Start     Ordered   06/09/23 0710  Procalcitonin  Once,   R       References:    Procalcitonin Lower Respiratory Tract Infection AND Sepsis Procalcitonin Algorithm   06/09/23 0709            Vitals/Pain Today's Vitals   06/09/23 0700 06/09/23 0715 06/09/23 0730 06/09/23 0740  BP: 114/68 118/68 123/79   Pulse: 75 84 86   Resp: (!) 27 (!) 30 17   Temp:    100.2 F (37.9 C)  TempSrc:    Oral  SpO2: 95% 95% 93%   PainSc:        Isolation Precautions No active isolations  Medications Medications  lactated ringers infusion ( Intravenous New Bag/Given 06/09/23 0437)  acetaminophen (TYLENOL)  tablet 650 mg (650 mg Oral Given 06/09/23 0744)  acetaminophen (TYLENOL) tablet 650 mg (650 mg Oral Given 06/08/23 2331)  cefTRIAXone (ROCEPHIN) 2 g in sodium chloride 0.9 % 100 mL IVPB (0 g Intravenous Stopped 06/09/23 0041)  azithromycin (ZITHROMAX) 500 mg in sodium chloride 0.9 % 250 mL IVPB (0 mg Intravenous Stopped 06/09/23 0140)  acetaminophen (TYLENOL) tablet 1,000 mg (1,000 mg Oral Given 06/09/23 0040)    Mobility Walks      Focused Assessments Pulmonary Assessment Handoff:  Lung sounds: Bilateral Breath Sounds: Diminished, Expiratory wheezes O2 Device: Nasal Cannula O2 Flow Rate (L/min): 3 L/min    R Recommendations: See Admitting Provider Note  Report given to:   Additional Notes: Patient using the urinal, COPD Hx uses oxygen at home baseline.

## 2023-06-10 DIAGNOSIS — J189 Pneumonia, unspecified organism: Secondary | ICD-10-CM

## 2023-06-10 LAB — CBC
HCT: 35.2 % — ABNORMAL LOW (ref 39.0–52.0)
Hemoglobin: 11.7 g/dL — ABNORMAL LOW (ref 13.0–17.0)
MCH: 30.2 pg (ref 26.0–34.0)
MCHC: 33.2 g/dL (ref 30.0–36.0)
MCV: 91 fL (ref 80.0–100.0)
Platelets: 101 10*3/uL — ABNORMAL LOW (ref 150–400)
RBC: 3.87 MIL/uL — ABNORMAL LOW (ref 4.22–5.81)
RDW: 13.4 % (ref 11.5–15.5)
WBC: 2.2 10*3/uL — ABNORMAL LOW (ref 4.0–10.5)
nRBC: 0 % (ref 0.0–0.2)

## 2023-06-10 LAB — BASIC METABOLIC PANEL
Anion gap: 10 (ref 5–15)
BUN: 17 mg/dL (ref 8–23)
CO2: 26 mmol/L (ref 22–32)
Calcium: 8.5 mg/dL — ABNORMAL LOW (ref 8.9–10.3)
Chloride: 101 mmol/L (ref 98–111)
Creatinine, Ser: 0.63 mg/dL (ref 0.61–1.24)
GFR, Estimated: >60 mL/min (ref >60)
Glucose, Bld: 139 mg/dL — ABNORMAL HIGH (ref 70–99)
Potassium: 3.8 mmol/L (ref 3.5–5.1)
Sodium: 137 mmol/L (ref 135–145)

## 2023-06-10 LAB — MAGNESIUM: Magnesium: 1.9 mg/dL (ref 1.7–2.4)

## 2023-06-10 MED ORDER — PREDNISONE 10 MG PO TABS: 40.0000 mg | ORAL_TABLET | Freq: Every day | ORAL | 0 refills | Status: AC

## 2023-06-10 MED ORDER — DM-GUAIFENESIN ER 30-600 MG PO TB12: 1.0000 | ORAL_TABLET | Freq: Two times a day (BID) | ORAL | 0 refills | Status: AC

## 2023-06-10 NOTE — TOC CM/SW Note (Signed)
 Transition of Care [facility]) - Inpatient Brief Assessment   Patient Details  Name: [name] MRN: [id number] Date of Birth: [birth year]  [facility] CM/SW Contact:    Villa Herb, LCSWA Phone Number: 06/10/2023, 9:38 AM   Clinical Narrative: Transition of Care Department [facility]) has reviewed patient and no TOC needs have been identified at this time. We will continue to monitor patient advancement through interdiciplinary progression rounds. If new patient transition needs arise, please place a TOC consult.   Transition of Care Asessment: Insurance and Status: Insurance coverage has been reviewed Patient has primary care physician: Yes Home environment has been reviewed: From home Prior level of function:: Independent Prior/Current Home Services: No current home services Social Drivers of Health Review: SDOH reviewed no interventions necessary Readmission risk has been reviewed: Yes Transition of care needs: no transition of care needs at this time

## 2023-06-10 NOTE — Discharge Summary (Signed)
 Physician Discharge Summary  [name] GNF:[id number] DOB: [birth year] DOA: 06/08/2023  PCP: Marylynn Pearson, FNP  Admit date: 06/08/2023  Discharge date: 06/10/2023  Admitted From:Home  Disposition:  Home  Recommendations for Outpatient Follow-up:  Follow up with PCP in 1-2 weeks Continue on prednisone as prescribed as well as antitussives and breathing treatments as needed Continue other home medications as prior  Home Health: None  Equipment/Devices: None  Discharge Condition:Stable  CODE STATUS: Full  Diet recommendation: Heart Healthy  Brief/Interim Summary: [name] is a 63 y.o. male with medical history significant for bipolar disorder, hepatitis C, asthma, chronic pain, dysphagia and aspiration, COPD, GERD, osteoarthritis, and prior PE who presented to the ED with fever at home as well as decreased oral intake, cough and shortness of breath over the last 1 week.  Patient was admitted with acute hypoxemic respiratory failure secondary to influenza A pneumonia as well as some mild COPD exacerbation.  He has improved markedly overnight and has weaned off of oxygen and has no oxygen requirement even with ambulation.  He appears to be in stable condition for discharge today and has had no further fever either.  He will remain on prednisone as prescribed and has breathing treatments available as needed.  Discharge Diagnoses:  Principal Problem:   CAP (community acquired pneumonia) Active Problems:   GERD (gastroesophageal reflux disease)   Hx of pulmonary embolus   COPD with acute exacerbation (HCC)   Anxiety   Acute hypoxemic respiratory failure (HCC)   Protein S deficiency (HCC)   Chronic pain/Opioid dependence   Influenza A  Principal discharge diagnosis: Acute hypoxemic respiratory failure secondary to influenza A pneumonia with mild COPD exacerbation.  Discharge Instructions  Discharge Instructions     Diet - low sodium heart healthy   Complete by: As  directed    Increase activity slowly   Complete by: As directed       Allergies as of 06/10/2023   No Known Allergies      Medication List     TAKE these medications    albuterol 108 (90 Base) MCG/ACT inhaler Commonly known as: VENTOLIN HFA Inhale 1-2 puffs into the lungs every 6 (six) hours as needed for wheezing or shortness of breath.   atorvastatin 10 MG tablet Commonly known as: LIPITOR Take 1 tablet by mouth daily.   dextromethorphan-guaiFENesin 30-600 MG 12hr tablet Commonly known as: MUCINEX DM Take 1 tablet by mouth 2 (two) times daily for 5 days.   omeprazole 40 MG capsule Commonly known as: PRILOSEC Take 40 mg by mouth daily.   predniSONE 10 MG tablet Commonly known as: DELTASONE Take 4 tablets (40 mg total) by mouth daily for 3 days.   rivaroxaban 20 MG Tabs tablet Commonly known as: XARELTO Take 20 mg by mouth daily.        Follow-up Information     Marylynn Pearson, FNP. Schedule an appointment as soon as possible for a visit in 1 week(s).   Specialty: Family Medicine Contact information: [address] [place] [address] [phone]                No Known Allergies  Consultations: None   Procedures/Studies: DG Chest 2 View Result Date: 06/09/2023 CLINICAL DATA:  Shortness of breath EXAM: CHEST - 2 VIEW COMPARISON:  Chest x-ray 03/11/2023 FINDINGS: There some patchy left lower lobe airspace disease. There is no pleural effusion or pneumothorax. The cardiomediastinal silhouette is within normal limits. No acute fractures  are seen. IMPRESSION: Patchy left lower lobe airspace disease concerning for pneumonia. Follow-up chest x-ray recommended in 4-6 weeks to confirm resolution. Electronically Signed   By: Darliss Cheney M.D.   On: 06/09/2023 00:38     Discharge Exam: Vitals:   06/10/23 0715 06/10/23 0834  BP:    Pulse:    Resp:    Temp:    SpO2: 94% 95%   Vitals:   06/10/23 0116 06/10/23 0254 06/10/23 0715  06/10/23 0834  BP:  108/64    Pulse:  73    Resp:      Temp:  98.1 F (36.7 C)    TempSrc:  Oral    SpO2: 92% 94% 94% 95%  Weight:      Height:        General: Pt is alert, awake, not in acute distress Cardiovascular: RRR, S1/S2 +, no rubs, no gallops Respiratory: CTA bilaterally, no wheezing, no rhonchi Abdominal: Soft, NT, ND, bowel sounds + Extremities: no edema, no cyanosis    The results of significant diagnostics from this hospitalization (including imaging, microbiology, ancillary and laboratory) are listed below for reference.     Microbiology: Recent Results (from the past 240 hours)  Resp panel by RT-PCR (RSV, Flu A&B, Covid) Anterior Nasal Swab     Status: Abnormal   Collection Time: 06/08/23 11:09 PM   Specimen: Anterior Nasal Swab  Result Value Ref Range Status   SARS Coronavirus 2 by RT PCR NEGATIVE NEGATIVE Final    Comment: (NOTE) SARS-CoV-2 target nucleic acids are NOT DETECTED.  The SARS-CoV-2 RNA is generally detectable in upper respiratory specimens during the acute phase of infection. The lowest concentration of SARS-CoV-2 viral copies this assay can detect is 138 copies/mL. A negative result does not preclude SARS-Cov-2 infection and should not be used as the sole basis for treatment or other patient management decisions. A negative result may occur with  improper specimen collection/handling, submission of specimen other than nasopharyngeal swab, presence of viral mutation(s) within the areas targeted by this assay, and inadequate number of viral copies(<138 copies/mL). A negative result must be combined with clinical observations, patient history, and epidemiological information. The expected result is Negative.  Fact Sheet for Patients:  BloggerCourse.com  Fact Sheet for Healthcare Providers:  SeriousBroker.it  This test is no t yet approved or cleared by the [place] FDA and  has been  authorized for detection and/or diagnosis of SARS-CoV-2 by FDA under an Emergency Use Authorization (EUA). This EUA will remain  in effect (meaning this test can be used) for the duration of the COVID-19 declaration under Section 564(b)(1) of the Act, 21 U.S.C.section 360bbb-3(b)(1), unless the authorization is terminated  or revoked sooner.       Influenza A by PCR POSITIVE (A) NEGATIVE Final   Influenza B by PCR NEGATIVE NEGATIVE Final    Comment: (NOTE) The Xpert Xpress SARS-CoV-2/FLU/RSV plus assay is intended as an aid in the diagnosis of influenza from Nasopharyngeal swab specimens and should not be used as a sole basis for treatment. Nasal washings and aspirates are unacceptable for Xpert Xpress SARS-CoV-2/FLU/RSV testing.  Fact Sheet for Patients: BloggerCourse.com  Fact Sheet for Healthcare Providers: SeriousBroker.it  This test is not yet approved or cleared by the [place] FDA and has been authorized for detection and/or diagnosis of SARS-CoV-2 by FDA under an Emergency Use Authorization (EUA). This EUA will remain in effect (meaning this test can be used) for the duration of the COVID-19 declaration under Section  564(b)(1) of the Act, 21 U.S.C. section 360bbb-3(b)(1), unless the authorization is terminated or revoked.     Resp Syncytial Virus by PCR NEGATIVE NEGATIVE Final    Comment: (NOTE) Fact Sheet for Patients: BloggerCourse.com  Fact Sheet for Healthcare Providers: SeriousBroker.it  This test is not yet approved or cleared by the [place] FDA and has been authorized for detection and/or diagnosis of SARS-CoV-2 by FDA under an Emergency Use Authorization (EUA). This EUA will remain in effect (meaning this test can be used) for the duration of the COVID-19 declaration under Section 564(b)(1) of the Act, 21 U.S.C. section 360bbb-3(b)(1), unless the  authorization is terminated or revoked.  Performed at [facility], [address], [place], [place] [address]   Blood Culture (routine x 2)     Status: None (Preliminary result)   Collection Time: 06/09/23 12:11 AM   Specimen: Right Antecubital; Blood  Result Value Ref Range Status   Specimen Description   Final    RIGHT ANTECUBITAL BOTTLES DRAWN AEROBIC AND ANAEROBIC   Special Requests Blood Culture adequate volume  Final   Culture   Final    NO GROWTH 1 DAY Performed at [facility], [address] [place], [place] [address]    Report Status PENDING  Incomplete  Blood Culture (routine x 2)     Status: None (Preliminary result)   Collection Time: 06/09/23 12:13 AM   Specimen: BLOOD LEFT FOREARM  Result Value Ref Range Status   Specimen Description   Final    BLOOD LEFT FOREARM BOTTLES DRAWN AEROBIC AND ANAEROBIC   Special Requests   Final    Blood Culture results may not be optimal due to an inadequate volume of blood received in culture bottles   Culture   Final    NO GROWTH 1 DAY Performed at [facility], [address], [place], [place] [address]    Report Status PENDING  Incomplete     Labs: BNP (last 3 results) No results for input(s): "BNP" in the last 8760 hours. Basic Metabolic Panel: Recent Labs  Lab 06/08/23 2304 06/09/23 0729 06/10/23 0250  NA 134* 133* 137  K 3.9 3.5 3.8  CL 100 100 101  CO2 26 24 26   GLUCOSE 95 83 139*  BUN 21 16 17   CREATININE 0.91 0.74 0.63  CALCIUM 8.9 8.2* 8.5*  MG  --  1.7 1.9   Liver Function Tests: No results for input(s): "AST", "ALT", "ALKPHOS", "BILITOT", "PROT", "ALBUMIN" in the last 168 hours. No results for input(s): "LIPASE", "AMYLASE" in the last 168 hours. No results for input(s): "AMMONIA" in the last 168 hours. CBC: Recent Labs  Lab 06/08/23 2304 06/09/23 0729 06/10/23 0250  WBC 4.0 3.2* 2.2*  HGB 13.2 11.1* 11.7*  HCT 39.5 34.1* 35.2*  MCV 93.6 93.4 91.0  PLT 120* 88* 101*   Cardiac Enzymes: No  results for input(s): "CKTOTAL", "CKMB", "CKMBINDEX", "TROPONINI" in the last 168 hours. BNP: Invalid input(s): "POCBNP" CBG: No results for input(s): "GLUCAP" in the last 168 hours. D-Dimer No results for input(s): "DDIMER" in the last 72 hours. Hgb A1c No results for input(s): "HGBA1C" in the last 72 hours. Lipid Profile No results for input(s): "CHOL", "HDL", "LDLCALC", "TRIG", "CHOLHDL", "LDLDIRECT" in the last 72 hours. Thyroid function studies No results for input(s): "TSH", "T4TOTAL", "T3FREE", "THYROIDAB" in the last 72 hours.  Invalid input(s): "FREET3" Anemia work up No results for input(s): "VITAMINB12", "FOLATE", "FERRITIN", "TIBC", "IRON", "RETICCTPCT" in the last 72 hours. Urinalysis  Component Value Date/Time   COLORURINE YELLOW 06/09/2023 0258   APPEARANCEUR CLEAR 06/09/2023 0258   APPEARANCEUR Clear 12/08/2022 1510   LABSPEC 1.027 06/09/2023 0258   PHURINE 5.0 06/09/2023 0258   GLUCOSEU NEGATIVE 06/09/2023 0258   HGBUR NEGATIVE 06/09/2023 0258   BILIRUBINUR NEGATIVE 06/09/2023 0258   BILIRUBINUR Negative 12/08/2022 1510   KETONESUR NEGATIVE 06/09/2023 0258   PROTEINUR 30 (A) 06/09/2023 0258   UROBILINOGEN 1.0 05/07/2022 1212   UROBILINOGEN 1.0 02/05/2015 1915   NITRITE NEGATIVE 06/09/2023 0258   LEUKOCYTESUR NEGATIVE 06/09/2023 0258   Sepsis Labs Recent Labs  Lab 06/08/23 2304 06/09/23 0729 06/10/23 0250  WBC 4.0 3.2* 2.2*   Microbiology Recent Results (from the past 240 hours)  Resp panel by RT-PCR (RSV, Flu A&B, Covid) Anterior Nasal Swab     Status: Abnormal   Collection Time: 06/08/23 11:09 PM   Specimen: Anterior Nasal Swab  Result Value Ref Range Status   SARS Coronavirus 2 by RT PCR NEGATIVE NEGATIVE Final    Comment: (NOTE) SARS-CoV-2 target nucleic acids are NOT DETECTED.  The SARS-CoV-2 RNA is generally detectable in upper respiratory specimens during the acute phase of infection. The lowest concentration of SARS-CoV-2 viral copies  this assay can detect is 138 copies/mL. A negative result does not preclude SARS-Cov-2 infection and should not be used as the sole basis for treatment or other patient management decisions. A negative result may occur with  improper specimen collection/handling, submission of specimen other than nasopharyngeal swab, presence of viral mutation(s) within the areas targeted by this assay, and inadequate number of viral copies(<138 copies/mL). A negative result must be combined with clinical observations, patient history, and epidemiological information. The expected result is Negative.  Fact Sheet for Patients:  BloggerCourse.com  Fact Sheet for Healthcare Providers:  SeriousBroker.it  This test is no t yet approved or cleared by the [place] FDA and  has been authorized for detection and/or diagnosis of SARS-CoV-2 by FDA under an Emergency Use Authorization (EUA). This EUA will remain  in effect (meaning this test can be used) for the duration of the COVID-19 declaration under Section 564(b)(1) of the Act, 21 U.S.C.section 360bbb-3(b)(1), unless the authorization is terminated  or revoked sooner.       Influenza A by PCR POSITIVE (A) NEGATIVE Final   Influenza B by PCR NEGATIVE NEGATIVE Final    Comment: (NOTE) The Xpert Xpress SARS-CoV-2/FLU/RSV plus assay is intended as an aid in the diagnosis of influenza from Nasopharyngeal swab specimens and should not be used as a sole basis for treatment. Nasal washings and aspirates are unacceptable for Xpert Xpress SARS-CoV-2/FLU/RSV testing.  Fact Sheet for Patients: BloggerCourse.com  Fact Sheet for Healthcare Providers: SeriousBroker.it  This test is not yet approved or cleared by the [place] FDA and has been authorized for detection and/or diagnosis of SARS-CoV-2 by FDA under an Emergency Use Authorization (EUA). This  EUA will remain in effect (meaning this test can be used) for the duration of the COVID-19 declaration under Section 564(b)(1) of the Act, 21 U.S.C. section 360bbb-3(b)(1), unless the authorization is terminated or revoked.     Resp Syncytial Virus by PCR NEGATIVE NEGATIVE Final    Comment: (NOTE) Fact Sheet for Patients: BloggerCourse.com  Fact Sheet for Healthcare Providers: SeriousBroker.it  This test is not yet approved or cleared by the [place] FDA and has been authorized for detection and/or diagnosis of SARS-CoV-2 by FDA under an Emergency Use Authorization (EUA). This EUA will remain in effect (meaning  this test can be used) for the duration of the COVID-19 declaration under Section 564(b)(1) of the Act, 21 U.S.C. section 360bbb-3(b)(1), unless the authorization is terminated or revoked.  Performed at [facility], [address], [place], [place] [address]   Blood Culture (routine x 2)     Status: None (Preliminary result)   Collection Time: 06/09/23 12:11 AM   Specimen: Right Antecubital; Blood  Result Value Ref Range Status   Specimen Description   Final    RIGHT ANTECUBITAL BOTTLES DRAWN AEROBIC AND ANAEROBIC   Special Requests Blood Culture adequate volume  Final   Culture   Final    NO GROWTH 1 DAY Performed at [facility], [address] [place], [place] [address]    Report Status PENDING  Incomplete  Blood Culture (routine x 2)     Status: None (Preliminary result)   Collection Time: 06/09/23 12:13 AM   Specimen: BLOOD LEFT FOREARM  Result Value Ref Range Status   Specimen Description   Final    BLOOD LEFT FOREARM BOTTLES DRAWN AEROBIC AND ANAEROBIC   Special Requests   Final    Blood Culture results may not be optimal due to an inadequate volume of blood received in culture bottles   Culture   Final    NO GROWTH 1 DAY Performed at [facility], [address], [place], [place] [address]    Report  Status PENDING  Incomplete     Time coordinating discharge: 35 minutes  SIGNED:   Erick Blinks, DO [facility] 06/10/2023, 9:35 AM  If 7PM-7AM, please contact night-coverage [email or web address]

## 2023-06-10 NOTE — Progress Notes (Signed)
SATURATION QUALIFICATIONS: (This note is used to comply with regulatory documentation for home oxygen)  Patient Saturations on Room Air at Rest = 94%  Patient Saturations on Room Air while Ambulating = 92%  

## 2023-06-14 LAB — CULTURE, BLOOD (ROUTINE X 2)
Culture: NO GROWTH
Culture: NO GROWTH
Special Requests: ADEQUATE

## 2023-07-19 NOTE — Progress Notes (Deleted)
 GI Office Note    Referring Provider: Katherine Basset* Primary Care Physician:  Marylynn Pearson, FNP Primary Gastroenterologist: *** Date:  07/19/2023  ID:  Eric, Stewart [birth year], MRN [id number]   Chief Complaint   No chief complaint on file.   History of Present Illness  [name] is a 63 y.o. male with a history of *** presenting today with complaint of   Per chart review he was previously following with [facility] for compensated HCV cirrhosis.  He was last seen in June 2017.  He had relapse after 24-week course of sofosbuvir/RVD completed June 2015 and remained negative until August 2015.  He was started on a 12-week course of Epclusa and weight-based for Ribavirin in October 2016 but unsure of exact date he started it.  He completed therapy without any side effects.  At time of appointment he reported mild icterus that have recently resolved, no lower extremity edema or ascites.  He did report worsening issues with memory but no overt HE.  Last EGD 2013 showed no varices.  Has never had screening colonoscopy.  AFP in November 2016 was 2.18.  Did not think he had ever gotten hepatitis A vaccination.  He notified Dr. Carrie Mew that he would be going to jail soon to serve a 2 to 3-year sentence after his appointment.  At the time of exam he reported fatigue, shortness of breath and cough but denied chest pain.  Labs and ultrasound updated.  Was due for colonoscopy and EGD.  Nonimmune to hepatitis A is immune to hepatitis B.  Patient was unsure if he received hepatitis A vaccination.  Labs at that time were significant for hemoglobin 15.1, platelets 162, PT 12.3, INR 1.16, sodium 139, potassium 4.3, creatinine 0.89, T. bili 0.3, alk phos 61, AST 10, ALT 6, albumin 4.2, AFP 2.08.   Per chart review patient is [facility] at which time he was having fevers and vomiting and attempted to get him an appointment to see GI.  She  reported that he had recently gotten released from jail on November 16, 2017.  No other follow-up with GI visible via epic or Care Everywhere.   Since July 2021 patient is on multiple ED visits for hospital admissions for various things but most significantly multiple episodes of pneumonia.  He had 1 ED visit for drug overdose in August 2021.  Most recent admission 07/13/2021 and discharged 07/15/2021.  He presented with shortness of breath, coughing for 4 to 6 weeks, and scant hemoptysis with intermittent fever.  He had reported history of aspiration.  He was given IV antibiotics during admission with CTA chest showing masslike opacity in the right lower lung base and also diagnosed with COPD exacerbation due to having some wheezing.  He was discharged with Augmentin and advised to repeat CT chest in 2 to 3 months and follow-up with his PCP.  Xarelto was briefly held during hospitalization but restarted due to high risk for clotting.    He had evaluation with speech therapy in July 2021 revealing mild to moderate pharyngeal phase dysphagia characterized by delayed swallow initiation with swallow trigger.  He has reduced tongue base retraction with pures.  Aspiration not observed during study he is at risk for aspiration given consistent penetration without complete removal.  Recommendation was for regular textures and thin liquids via cup sip, no straws.  Last office visit 08/26/21.c/p dysphagia. Discussed constipation, GERD, and chronic hepatitis C as  well. Labs ordered along with [place]. Advised discussion of EGD and colonoscopy at follow up given burn to LLE and need for further treatment. ***    Today:    Wt Readings from Last 3 Encounters:  06/09/23 205 lb (93 kg)  03/11/23 200 lb (90.7 kg)  09/04/22 201 lb (91.2 kg)    Current Outpatient Medications  Medication Sig Dispense Refill   albuterol (VENTOLIN HFA) 108 (90 Base) MCG/ACT inhaler Inhale 1-2 puffs into the lungs every 6 (six) hours as needed  for wheezing or shortness of breath. 8 g 0   atorvastatin (LIPITOR) 10 MG tablet Take 1 tablet by mouth daily.     omeprazole (PRILOSEC) 40 MG capsule Take 40 mg by mouth daily.     rivaroxaban (XARELTO) 20 MG TABS tablet Take 20 mg by mouth daily.     No current facility-administered medications for this visit.    Past Medical History:  Diagnosis Date   Anxiety    Arthritis    Asthma    Bipolar disorder (HCC)    Chronic pain    COPD (chronic obstructive pulmonary disease) (HCC)    Degenerative disk disease    GERD (gastroesophageal reflux disease)    Hiatal hernia    History of pneumonia    PE (pulmonary embolism) 2012   Protein S deficiency (HCC)    Schizophrenia (HCC)     Past Surgical History:  Procedure Laterality Date   CHOLECYSTECTOMY     CLOSED REDUCTION MANDIBLE WITH MANDIBULOMA  10/21/2011   Procedure: CLOSED REDUCTION MANDIBLE WITH MANDIBULOMAXILLARY FUSION;  Surgeon: Darletta Moll, MD;  Location: [facility] OR;  Service: ENT;  Laterality: N/A;  MMF screws   HAND SURGERY Right 20 years ago   INCISION AND DRAINAGE OF WOUND Left 08/27/2021   Procedure: IRRIGATION AND DEBRIDEMENT WOUND; left leg;  Surgeon: Lucretia Roers, MD;  Location: [facility];  Service: General;  Laterality: Left;   INCISIONAL HERNIA REPAIR N/A 10/27/2018   Procedure: HERNIA REPAIR INCISIONAL WITH MESH;  Surgeon: Franky Macho, MD;  Location: [facility];  Service: General;  Laterality: N/A;   INCISIONAL HERNIA REPAIR N/A 02/23/2019   Procedure: LAPAROSCOPIC INCISIONAL HERNIA REPAIR WITH MESH;  Surgeon: Franky Macho, MD;  Location: [facility];  Service: General;  Laterality: N/A;   LEG SURGERY     MANDIBULAR HARDWARE REMOVAL  11/11/2011   Procedure: MANDIBULAR HARDWARE REMOVAL;  Surgeon: Darletta Moll, MD;  Location: [facility];  Service: ENT;  Laterality: N/A;   ORIF MANDIBULAR FRACTURE  10/21/2011   Procedure: OPEN REDUCTION INTERNAL FIXATION (ORIF) MANDIBULAR FRACTURE;  Surgeon: Darletta Moll, MD;  Location:  [facility] OR;  Service: ENT;  Laterality: N/A;   UMBILICAL HERNIA REPAIR      Family History  Problem Relation Age of Onset   Coronary artery disease Mother    Colon polyps Mother    Coronary artery disease Father    Colon polyps Father    Diabetes Other     Allergies as of 07/20/2023   (No Known Allergies)    Social History   Socioeconomic History   Marital status: Legally Separated    Spouse name: Not on file   Number of children: Not on file   Years of education: Not on file   Highest education level: Not on file  Occupational History   Not on file  Tobacco Use   Smoking status: Every Day    Current packs/day: 0.25    Average packs/day: 0.3  packs/day for 35.0 years (8.8 ttl pk-yrs)    Types: Cigarettes   Smokeless tobacco: Never   Tobacco comments:    Smokes 13 cigarettes  a day [facility] 05/07/21  Vaping Use   Vaping status: Every Day  Substance and Sexual Activity   Alcohol use: No    Alcohol/week: 0.0 standard drinks of alcohol   Drug use: Not Currently    Types: Marijuana    Comment: suboxone, adderall, ativan, meth, xanaa   Sexual activity: Not on file  Other Topics Concern   Not on file  Social History Narrative   Not on file   Social Drivers of Health   Financial Resource Strain: Not on file  Food Insecurity: No Food Insecurity (06/09/2023)   Hunger Vital Sign    Worried About Running Out of Food in the Last Year: Never true    Ran Out of Food in the Last Year: Never true  Transportation Needs: No Transportation Needs (06/09/2023)   PRAPARE - Administrator, Civil Service (Medical): No    Lack of Transportation (Non-Medical): No  Physical Activity: Not on file  Stress: Not on file  Social Connections: Not on file     Review of Systems   Gen: Denies fever, chills, anorexia. Denies fatigue, weakness, weight loss.  CV: Denies chest pain, palpitations, syncope, peripheral edema, and claudication. Resp: Denies dyspnea at rest, cough, wheezing,  coughing up blood, and pleurisy. GI: See HPI Derm: Denies rash, itching, dry skin Psych: Denies depression, anxiety, memory loss, confusion. No homicidal or suicidal ideation.  Heme: Denies bruising, bleeding, and enlarged lymph nodes.  Physical Exam   There were no vitals taken for this visit.  General:   Alert and oriented. No distress noted. Pleasant and cooperative.  Head:  Normocephalic and atraumatic. Eyes:  Conjuctiva clear without scleral icterus. Mouth:  Oral mucosa pink and moist. Good dentition. No lesions. Lungs:  Clear to auscultation bilaterally. No wheezes, rales, or rhonchi. No distress.  Heart:  S1, S2 present without murmurs appreciated.  Abdomen:  +BS, soft, non-tender and non-distended. No rebound or guarding. No HSM or masses noted. Rectal: *** Msk:  Symmetrical without gross deformities. Normal posture. Extremities:  Without edema. Neurologic:  Alert and  oriented x4 Psych:  Alert and cooperative. Normal mood and affect.  Assessment  OSA CAMPOLI is a 63 y.o. male with a history of *** presenting today with   Hepatitis C:  Constipation;  Dysphagia:  GERD:  Family history of colon polyps, screening colon cancer:  PLAN   ***     Brooke Bonito, MSN, FNP-BC, AGACNP-BC [facility]

## 2023-07-20 ENCOUNTER — Encounter: Admitting: Gastroenterology

## 2023-07-21 ENCOUNTER — Encounter: Payer: Self-pay | Admitting: Gastroenterology

## 2023-09-02 ENCOUNTER — Emergency Department (HOSPITAL_COMMUNITY)
Admission: EM | Admit: 2023-09-02 | Discharge: 2023-09-02 | Discharge status: Against Medical Advice | Attending: Emergency Medicine | Admitting: Emergency Medicine

## 2023-09-02 ENCOUNTER — Encounter (HOSPITAL_COMMUNITY): Payer: Self-pay

## 2023-09-02 ENCOUNTER — Other Ambulatory Visit: Payer: Self-pay

## 2023-09-02 DIAGNOSIS — Z5321 Procedure and treatment not carried out due to patient leaving prior to being seen by health care provider: Secondary | ICD-10-CM | POA: Insufficient documentation

## 2023-09-02 DIAGNOSIS — R42 Dizziness and giddiness: Secondary | ICD-10-CM | POA: Diagnosis not present

## 2023-09-02 DIAGNOSIS — R059 Cough, unspecified: Secondary | ICD-10-CM | POA: Insufficient documentation

## 2023-09-02 DIAGNOSIS — H9222 Otorrhagia, left ear: Secondary | ICD-10-CM | POA: Insufficient documentation

## 2023-09-02 HISTORY — DX: Unspecified viral hepatitis B without hepatic coma: B19.10

## 2023-09-02 HISTORY — DX: Bursopathy, unspecified: M71.9

## 2023-09-02 HISTORY — DX: Unspecified viral hepatitis C without hepatic coma: B19.20

## 2023-09-02 HISTORY — DX: Unspecified cirrhosis of liver: K74.60

## 2023-09-02 LAB — COMPREHENSIVE METABOLIC PANEL WITH GFR
ALT: 30 U/L (ref 0–44)
AST: 23 U/L (ref 15–41)
Albumin: 3.8 g/dL (ref 3.5–5.0)
Alkaline Phosphatase: 77 U/L (ref 38–126)
Anion gap: 6 (ref 5–15)
BUN: 21 mg/dL (ref 8–23)
CO2: 25 mmol/L (ref 22–32)
Calcium: 9 mg/dL (ref 8.9–10.3)
Chloride: 100 mmol/L (ref 98–111)
Creatinine, Ser: 0.76 mg/dL (ref 0.61–1.24)
GFR, Estimated: >60 mL/min (ref >60)
Glucose, Bld: 130 mg/dL — ABNORMAL HIGH (ref 70–99)
Potassium: 4 mmol/L (ref 3.5–5.1)
Sodium: 131 mmol/L — ABNORMAL LOW (ref 135–145)
Total Bilirubin: 0.4 mg/dL (ref 0.0–1.2)
Total Protein: 7.2 g/dL (ref 6.5–8.1)

## 2023-09-02 LAB — CBC WITH DIFFERENTIAL/PLATELET
Abs Immature Granulocytes: 0.02 10*3/uL (ref 0.00–0.07)
Basophils Absolute: 0 10*3/uL (ref 0.0–0.1)
Basophils Relative: 1 %
Eosinophils Absolute: 0.1 10*3/uL (ref 0.0–0.5)
Eosinophils Relative: 2 %
HCT: 41.9 % (ref 39.0–52.0)
Hemoglobin: 14.2 g/dL (ref 13.0–17.0)
Immature Granulocytes: 0 %
Lymphocytes Relative: 21 %
Lymphs Abs: 1.6 10*3/uL (ref 0.7–4.0)
MCH: 31.2 pg (ref 26.0–34.0)
MCHC: 33.9 g/dL (ref 30.0–36.0)
MCV: 92.1 fL (ref 80.0–100.0)
Monocytes Absolute: 0.5 10*3/uL (ref 0.1–1.0)
Monocytes Relative: 7 %
Neutro Abs: 5.2 10*3/uL (ref 1.7–7.7)
Neutrophils Relative %: 69 %
Platelets: 212 10*3/uL (ref 150–400)
RBC: 4.55 MIL/uL (ref 4.22–5.81)
RDW: 13.7 % (ref 11.5–15.5)
WBC: 7.5 10*3/uL (ref 4.0–10.5)
nRBC: 0 % (ref 0.0–0.2)

## 2023-09-02 NOTE — ED Triage Notes (Signed)
 Pt arrived via POV c/o blood coming from his left ear this morning. Pt rambling in Triage with multiple complaints, reporting needing an US  of his Liver, US  of his left leg for DVT Study, vertigo, and reports having a cough and struggling to take care of his mother at home.

## 2023-10-23 ENCOUNTER — Encounter (HOSPITAL_COMMUNITY): Payer: Self-pay

## 2023-10-23 ENCOUNTER — Emergency Department (HOSPITAL_COMMUNITY)
Admission: EM | Admit: 2023-10-23 | Discharge: 2023-10-23 | Discharge status: Against Medical Advice | Attending: Emergency Medicine | Admitting: Emergency Medicine

## 2023-10-23 ENCOUNTER — Emergency Department (HOSPITAL_COMMUNITY)

## 2023-10-23 ENCOUNTER — Other Ambulatory Visit: Payer: Self-pay

## 2023-10-23 DIAGNOSIS — J181 Lobar pneumonia, unspecified organism: Secondary | ICD-10-CM | POA: Insufficient documentation

## 2023-10-23 DIAGNOSIS — F17210 Nicotine dependence, cigarettes, uncomplicated: Secondary | ICD-10-CM | POA: Diagnosis not present

## 2023-10-23 DIAGNOSIS — J189 Pneumonia, unspecified organism: Secondary | ICD-10-CM

## 2023-10-23 DIAGNOSIS — Z5329 Procedure and treatment not carried out because of patient's decision for other reasons: Secondary | ICD-10-CM | POA: Insufficient documentation

## 2023-10-23 DIAGNOSIS — J45909 Unspecified asthma, uncomplicated: Secondary | ICD-10-CM | POA: Insufficient documentation

## 2023-10-23 DIAGNOSIS — R059 Cough, unspecified: Secondary | ICD-10-CM | POA: Diagnosis present

## 2023-10-23 DIAGNOSIS — Z7901 Long term (current) use of anticoagulants: Secondary | ICD-10-CM | POA: Diagnosis not present

## 2023-10-23 LAB — CBC WITH DIFFERENTIAL/PLATELET
Abs Immature Granulocytes: 0.01 K/uL (ref 0.00–0.07)
Basophils Absolute: 0.1 K/uL (ref 0.0–0.1)
Basophils Relative: 1 %
Eosinophils Absolute: 0.2 K/uL (ref 0.0–0.5)
Eosinophils Relative: 3 %
HCT: 37.6 % — ABNORMAL LOW (ref 39.0–52.0)
Hemoglobin: 12.2 g/dL — ABNORMAL LOW (ref 13.0–17.0)
Immature Granulocytes: 0 %
Lymphocytes Relative: 17 %
Lymphs Abs: 1.1 K/uL (ref 0.7–4.0)
MCH: 30.8 pg (ref 26.0–34.0)
MCHC: 32.4 g/dL (ref 30.0–36.0)
MCV: 94.9 fL (ref 80.0–100.0)
Monocytes Absolute: 0.5 K/uL (ref 0.1–1.0)
Monocytes Relative: 8 %
Neutro Abs: 4.6 K/uL (ref 1.7–7.7)
Neutrophils Relative %: 71 %
Platelets: 164 K/uL (ref 150–400)
RBC: 3.96 MIL/uL — ABNORMAL LOW (ref 4.22–5.81)
RDW: 12.9 % (ref 11.5–15.5)
WBC: 6.5 K/uL (ref 4.0–10.5)
nRBC: 0 % (ref 0.0–0.2)

## 2023-10-23 LAB — BASIC METABOLIC PANEL WITH GFR
Anion gap: 9 (ref 5–15)
BUN: 15 mg/dL (ref 8–23)
CO2: 28 mmol/L (ref 22–32)
Calcium: 9.1 mg/dL (ref 8.9–10.3)
Chloride: 101 mmol/L (ref 98–111)
Creatinine, Ser: 0.67 mg/dL (ref 0.61–1.24)
GFR, Estimated: >60 mL/min (ref >60)
Glucose, Bld: 105 mg/dL — ABNORMAL HIGH (ref 70–99)
Potassium: 4.2 mmol/L (ref 3.5–5.1)
Sodium: 138 mmol/L (ref 135–145)

## 2023-10-23 MED ORDER — CEFTRIAXONE SODIUM 2 G IJ SOLR
2.0000 g | Freq: Once | INTRAMUSCULAR | Status: DC
  Filled 2023-10-23: qty 20

## 2023-10-23 MED ORDER — AZITHROMYCIN 250 MG PO TABS: 250.0000 mg | ORAL_TABLET | Freq: Every day | ORAL | 0 refills | Status: DC

## 2023-10-23 NOTE — ED Notes (Signed)
 Unable to locate pt.  Not in lobby, bathroom or CT scanner.  Searched for 30 minutes and pt never returned to room.

## 2023-10-23 NOTE — ED Provider Notes (Signed)
 Patient was noted to have eloped from the department.  I called the patient on his cell phone to ask him to return to the emergency department.  I was concerned that a recurrent pneumonia on the chest x-ray might need treatment or could represent something more serious like a malignancy or lung infarction and patient needs the scan.  I communicated in a voice message that I wanted patient to return to the emergency department at his earliest convenience.   Franklyn Sid SAILOR, MD 10/23/23 1754

## 2023-10-23 NOTE — ED Provider Notes (Signed)
 [place] EMERGENCY DEPARTMENT AT [facility] Provider Note   CSN: [id number] Arrival date & time: 10/23/23  1238     Patient presents with: Flank Pain   [name] is a 63 y.o. male with history significant for pulmonary embolus secondary to protein S deficiency, stating he has been compliant with his Xarelto , also has a history of community-acquired pneumonia, was actually admitted to the hospital for pneumonia in February, has a history of hepatitis C, GERD, asthma, cirrhosis presenting with left flank pain along with cough productive of yellow sputum and chills but no documented fevers.  He states he usually coughs every morning, clearing clear sputum, but over the past several days that his sputum has turned yellow, denies hemoptysis.  He has pain in his left lateral lower rib cage area which is worse with coughing and deep inspiration.  He states he felt this way when he had his last episode of pneumonia.  He denies nausea or vomiting, fevers or abdominal pain.  He has had no swelling in his arms or legs.  He has had no treatment prior to arrival.   The history is provided by the patient.       Prior to Admission medications   Medication Sig Start Date End Date Taking? Authorizing Provider  azithromycin  (ZITHROMAX ) 250 MG tablet Take 1 tablet (250 mg total) by mouth daily. Take first 2 tablets together, then 1 every day until finished. 10/23/23  Yes Romario Tith, Mliss, PA-C  albuterol  (VENTOLIN  HFA) 108 (90 Base) MCG/ACT inhaler Inhale 1-2 puffs into the lungs every 6 (six) hours as needed for wheezing or shortness of breath. 06/07/22   Small, Brooke L, PA  atorvastatin  (LIPITOR) 10 MG tablet Take 1 tablet by mouth daily. 06/11/21   [provider]  omeprazole (PRILOSEC) 40 MG capsule Take 40 mg by mouth daily. 04/21/22   [provider]  rivaroxaban  (XARELTO ) 20 MG TABS tablet Take 20 mg by mouth daily.    [provider]    Allergies: Patient has no  known allergies.    Review of Systems  Constitutional:  Positive for chills. Negative for fever.  HENT:  Negative for congestion and sore throat.   Eyes: Negative.   Respiratory:  Positive for cough and shortness of breath. Negative for chest tightness and wheezing.   Cardiovascular:  Negative for chest pain, palpitations and leg swelling.  Gastrointestinal:  Negative for abdominal pain, nausea and vomiting.  Genitourinary: Negative.   Musculoskeletal:  Negative for arthralgias, joint swelling and neck pain.  Skin: Negative.  Negative for rash and wound.  Neurological:  Negative for dizziness, weakness, light-headedness, numbness and headaches.  Psychiatric/Behavioral: Negative.      Updated Vital Signs BP 122/85 (BP Location: Right Arm)   Pulse 68   Temp 97.9 F (36.6 C)   Resp 18   Ht 6' 2 (1.88 m)   Wt 93 kg   SpO2 99%   BMI 26.32 kg/m   Physical Exam Vitals and nursing note reviewed.  Constitutional:      Appearance: He is well-developed.  HENT:     Head: Normocephalic and atraumatic.  Eyes:     Conjunctiva/sclera: Conjunctivae normal.  Cardiovascular:     Rate and Rhythm: Normal rate and regular rhythm.     Heart sounds: Normal heart sounds.  Pulmonary:     Effort: No accessory muscle usage or prolonged expiration.     Breath sounds: Normal breath sounds. Decreased air movement present. No wheezing,  rhonchi or rales.     Comments: Obvious discomfort with deep inspiration.  His pain localizes to his left lateral lower rib cage at the mid axillary area, it is not reproducible with palpation. Abdominal:     General: Bowel sounds are normal.     Palpations: Abdomen is soft.     Tenderness: There is no abdominal tenderness.  Musculoskeletal:        General: Normal range of motion.     Cervical back: Normal range of motion.     Right lower leg: No edema.     Left lower leg: No edema.  Skin:    General: Skin is warm and dry.  Neurological:     Mental Status: He  is alert.     (all labs ordered are listed, but only abnormal results are displayed) Labs Reviewed  BASIC METABOLIC PANEL WITH GFR - Abnormal; Notable for the following components:      Result Value   Glucose, Bld 105 (*)    All other components within normal limits  CBC WITH DIFFERENTIAL/PLATELET - Abnormal; Notable for the following components:   RBC 3.96 (*)    Hemoglobin 12.2 (*)    HCT 37.6 (*)    All other components within normal limits  URINALYSIS, ROUTINE W REFLEX MICROSCOPIC    EKG: EKG Interpretation Date/Time:  Friday October 23 2023 13:08:15 EDT Ventricular Rate:  68 PR Interval:  162 QRS Duration:  110 QT Interval:  402 QTC Calculation: 427 R Axis:   43  Text Interpretation: Normal sinus rhythm Normal ECG Confirmed by Franklyn Gills [phone]) on 10/23/2023 4:34:12 PM  Radiology: [facility] Chest 2 View Result Date: 10/23/2023 CLINICAL DATA:  Cough EXAM: CHEST - 2 VIEW COMPARISON:  Chest radiograph dated 06/08/2023 FINDINGS: Normal lung volumes. Persistent patchy left lower lobe opacity. No pleural effusion or pneumothorax. The heart size and mediastinal contours are within normal limits. No acute osseous abnormality. Right upper quadrant surgical clips. IMPRESSION: Persistent patchy left lower lobe opacity, suspicious for recurrent pneumonia in the acute setting. Electronically Signed   By: Limin  Xu M.D.   On: 10/23/2023 13:54     Procedures   Medications Ordered in the ED  cefTRIAXone  (ROCEPHIN ) 2 g in sodium chloride  0.9 % 100 mL IVPB (has no administration in time range)                                    Medical Decision Making Patient presenting with pleuritic left lower chest/lung pain along with increased cough and yellowing of his normally clear sputum suggesting recurrence of his pneumonia.  However he does have a significant history of cigarette use and has been protein S syndrome, compliant with Xarelto .  He was felt to need a CT angio, recurrent PE unlikely  given he has been compliant with the Xarelto , but given his cigarette history and his recurrent pneumonia in the same location, raises concern about a mass at this location.  This was ordered, unfortunately patient eloped prior to me being aware.  Although Rocephin  had been ordered he had not received that yet here.  I have sent a prescription for his Zithromax  to his pharmacy however to cover him in the event this is again a simple community-acquired pneumonia.  Amount and/or Complexity of Data Reviewed Labs: ordered. Radiology: ordered.  Risk Prescription drug management.        Final diagnoses:  Community acquired pneumonia  of left lower lobe of lung    ED Discharge Orders          Ordered    azithromycin  (ZITHROMAX ) 250 MG tablet  Daily        10/23/23 1821               Maryfrances Portugal, PA-C 10/23/23 1825    Franklyn Sid SAILOR, MD 10/24/23 [phone]

## 2023-10-23 NOTE — ED Triage Notes (Signed)
 Complains of left flank pain.  Reports a little burning with urination but reprots when he hurts where he hurts its PNA.  Patient reports yellow snot every morning.  Complains of sob too.  Pain when he takes in a deep a breath.

## 2023-10-24 ENCOUNTER — Encounter (HOSPITAL_COMMUNITY): Payer: Self-pay

## 2023-10-24 ENCOUNTER — Other Ambulatory Visit: Payer: Self-pay

## 2023-10-24 ENCOUNTER — Emergency Department (HOSPITAL_COMMUNITY)

## 2023-10-24 ENCOUNTER — Emergency Department (HOSPITAL_COMMUNITY)
Admission: EM | Admit: 2023-10-24 | Discharge: 2023-10-24 | Disposition: A | Discharge status: Home / Self Care | Attending: Emergency Medicine | Admitting: Emergency Medicine

## 2023-10-24 DIAGNOSIS — J168 Pneumonia due to other specified infectious organisms: Secondary | ICD-10-CM | POA: Insufficient documentation

## 2023-10-24 DIAGNOSIS — Z7901 Long term (current) use of anticoagulants: Secondary | ICD-10-CM | POA: Diagnosis not present

## 2023-10-24 DIAGNOSIS — R0789 Other chest pain: Secondary | ICD-10-CM | POA: Diagnosis present

## 2023-10-24 DIAGNOSIS — J189 Pneumonia, unspecified organism: Secondary | ICD-10-CM

## 2023-10-24 MED ORDER — AZITHROMYCIN 250 MG PO TABS
500.0000 mg | ORAL_TABLET | Freq: Once | ORAL | Status: AC
  Administered 2023-10-24: 500 mg via ORAL
  Filled 2023-10-24: qty 2

## 2023-10-24 MED ORDER — IOHEXOL 350 MG/ML SOLN
75.0000 mL | Freq: Once | INTRAVENOUS | Status: AC | PRN
  Administered 2023-10-24: 75 mL via INTRAVENOUS

## 2023-10-24 MED ORDER — SODIUM CHLORIDE 0.9 % IV SOLN
1.0000 g | Freq: Once | INTRAVENOUS | Status: AC
  Administered 2023-10-24: 1 g via INTRAVENOUS
  Filled 2023-10-24: qty 10

## 2023-10-24 MED ORDER — AMOXICILLIN-POT CLAVULANATE 875-125 MG PO TABS
1.0000 | ORAL_TABLET | Freq: Two times a day (BID) | ORAL | 0 refills | Status: DC
Start: 2023-10-24 — End: 2023-11-17

## 2023-10-24 NOTE — ED Triage Notes (Signed)
 Patient was here yesterday and diagnosed with PNA and sent home and returns with worsening pain with breathing. Patient has extensive history of pna and blood clots.  Patient had to leave yesterday due to emergency.

## 2023-10-24 NOTE — ED Provider Notes (Signed)
 [place] EMERGENCY DEPARTMENT AT [facility] Provider Note   CSN: [id number] Arrival date & time: 10/24/23  1343     Patient presents with: Pleurisy   [name] is a 63 y.o. male.   HPI This patient is a 64 year old male history of cirrhosis recurrent pneumonia and recurrent pulmonary embolism currently on Xarelto  who states that yesterday he was seen for some left-sided chest discomfort and was diagnosed with a possible pneumonia, he left before the medications could be started, presents today because of ongoing left-sided chest discomfort.  No fevers no vomiting, the patient cannot put down the bag of chips he is eating while we are talking making it hard to understand anything that he says    Prior to Admission medications   Medication Sig Start Date End Date Taking? Authorizing Provider  amoxicillin -clavulanate (AUGMENTIN ) 875-125 MG tablet Take 1 tablet by mouth every 12 (twelve) hours. 10/24/23  Yes Cleotilde Rogue, MD  albuterol  (VENTOLIN  HFA) 108 [phone] Base) MCG/ACT inhaler Inhale 1-2 puffs into the lungs every 6 (six) hours as needed for wheezing or shortness of breath. 06/07/22   Small, Brooke L, PA  atorvastatin  (LIPITOR) 10 MG tablet Take 1 tablet by mouth daily. 06/11/21   [provider]  azithromycin  (ZITHROMAX ) 250 MG tablet Take 1 tablet (250 mg total) by mouth daily. Take first 2 tablets together, then 1 every day until finished. 10/23/23   Idol, Mliss, PA-C  BREZTRI AEROSPHERE 160-9-4.8 MCG/ACT AERO inhaler Inhale 2 puffs into the lungs 2 (two) times daily. 07/08/23   [provider]  escitalopram (LEXAPRO) 10 MG tablet Take 10 mg by mouth daily.    [provider]  meclizine  (ANTIVERT ) 25 MG tablet Take 1 tablet by mouth 3 (three) times daily as needed.    [provider]  methocarbamol (ROBAXIN) 500 MG tablet Take 500 mg by mouth every 8 (eight) hours as needed for muscle spasms. 09/15/23   [provider]  omeprazole  (PRILOSEC) 40 MG capsule Take 40 mg by mouth daily. 04/21/22   [provider]  ondansetron  (ZOFRAN ) 8 MG tablet Take 8 mg by mouth every 12 (twelve) hours as needed for nausea or vomiting.    [provider]  rivaroxaban  (XARELTO ) 20 MG TABS tablet Take 20 mg by mouth daily.    [provider]    Allergies: Patient has no known allergies.    Review of Systems  All other systems reviewed and are negative.   Updated Vital Signs BP 111/85   Pulse 72   Temp 98 F (36.7 C) (Oral)   Resp (!) 23   Ht 1.88 m (6' 2)   Wt 93 kg   SpO2 94%   BMI 26.32 kg/m   Physical Exam Vitals and nursing note reviewed.  Constitutional:      General: He is not in acute distress.    Appearance: He is well-developed.  HENT:     Head: Normocephalic and atraumatic.     Mouth/Throat:     Pharynx: No oropharyngeal exudate.  Eyes:     General: No scleral icterus.       Right eye: No discharge.        Left eye: No discharge.     Conjunctiva/sclera: Conjunctivae normal.     Pupils: Pupils are equal, round, and reactive to light.  Neck:     Thyroid: No thyromegaly.     Vascular: No JVD.  Cardiovascular:     Rate and Rhythm:  Normal rate and regular rhythm.     Heart sounds: Normal heart sounds. No murmur heard.    No friction rub. No gallop.  Pulmonary:     Effort: Pulmonary effort is normal. No respiratory distress.     Breath sounds: Normal breath sounds. No wheezing or rales.  Abdominal:     General: Bowel sounds are normal. There is no distension.     Palpations: Abdomen is soft. There is no mass.     Tenderness: There is no abdominal tenderness.  Musculoskeletal:        General: No tenderness. Normal range of motion.     Cervical back: Normal range of motion and neck supple.     Right lower leg: No edema.     Left lower leg: No edema.  Lymphadenopathy:     Cervical: No cervical adenopathy.  Skin:    General: Skin is warm and dry.     Findings: No erythema or  rash.  Neurological:     General: No focal deficit present.     Mental Status: He is alert.     Coordination: Coordination normal.  Psychiatric:        Behavior: Behavior normal.     (all labs ordered are listed, but only abnormal results are displayed) Labs Reviewed - No data to display  EKG: None  Radiology: CT Angio Chest PE W and/or Wo Contrast Result Date: 10/24/2023 CLINICAL DATA:  Pulmonary embolism (PE) suspected, high prob persistent left lower lung mass / infiltrate EXAM: CT ANGIOGRAPHY CHEST WITH CONTRAST TECHNIQUE: Multidetector CT imaging of the chest was performed using the standard protocol during bolus administration of intravenous contrast. Multiplanar CT image reconstructions and MIPs were obtained to evaluate the vascular anatomy. RADIATION DOSE REDUCTION: This exam was performed according to the departmental dose-optimization program which includes automated exposure control, adjustment of the mA and/or kV according to patient size and/or use of iterative reconstruction technique. CONTRAST:  75mL OMNIPAQUE  IOHEXOL  350 MG/ML SOLN COMPARISON:  July 13, 2021 FINDINGS: Cardiovascular: Satisfactory opacification of the pulmonary arteries to the segmental level with similar imitation is at the bases due to motion. No evidence of pulmonary embolism. Normal heart size. No pericardial effusion. Mediastinum/Nodes: Visualized thyroid is unremarkable. Prominent mediastinal and hilar lymph nodes. Ill-defined subcarinal lymph node measures 14 mm in the short axis (series 5, image 158). LEFT hilar lymph node measures 12 mm in the short axis (series 5, image 173). Pretracheal lymph node measures 10 mm in the short axis (series 5, image 125). No axillary adenopathy. Lungs/Pleura: No pleural effusion or pneumothorax. There is mild bronchial wall thickening. There is centrilobular ground-glass nodularity throughout the LEFT greater than RIGHT lower lobe, suboptimally assessed due to motion. There  are several rounded hypodense confluent opacities in the LEFT lower lobe. Largest measures 25 x 17 mm (series 5, image changed 18). Additional nodular opacity measures approximately 9 mm (series 5, image 226). Irregular nodule of the LEFT upper lobe measures 6 x 3 mm, similar compared to more remote prior and likely reflecting scar (series 5, image 106). Resolution of previously visualized hypoattenuating opacity of the RIGHT lower lobe. Upper Abdomen: Stomach is distended with mixed low and high density enteric debris. Status post cholecystectomy. Spleen spans 13 cm there is, Musculoskeletal: No chest wall abnormality. No acute or significant osseous findings. Review of the MIP images confirms the above findings. IMPRESSION: 1. No evidence of pulmonary embolism. 2. There is centrilobular ground-glass nodularity throughout the LEFT greater than RIGHT lower  lobes, suboptimally assessed due to motion. There are several rounded hypodense confluent opacities in the LEFT lower lobe. These are favored to reflect infection. Recommend follow-up CT in 3 months to assess for resolution. 3. Prominent mediastinal and hilar lymph nodes are favored reactive. These can be reassessed on follow-up. Electronically Signed   By: Corean Salter M.D.   On: 10/24/2023 16:14   DG Chest 2 View Result Date: 10/23/2023 CLINICAL DATA:  Cough EXAM: CHEST - 2 VIEW COMPARISON:  Chest radiograph dated 06/08/2023 FINDINGS: Normal lung volumes. Persistent patchy left lower lobe opacity. No pleural effusion or pneumothorax. The heart size and mediastinal contours are within normal limits. No acute osseous abnormality. Right upper quadrant surgical clips. IMPRESSION: Persistent patchy left lower lobe opacity, suspicious for recurrent pneumonia in the acute setting. Electronically Signed   By: Limin  Xu M.D.   On: 10/23/2023 13:54     Procedures   Medications Ordered in the ED  cefTRIAXone  (ROCEPHIN ) 1 g in sodium chloride  0.9 % 100 mL  IVPB (has no administration in time range)  azithromycin  (ZITHROMAX ) tablet 500 mg (has no administration in time range)  iohexol  (OMNIPAQUE ) 350 MG/ML injection 75 mL (75 mLs Intravenous Contrast Given 10/24/23 1524)                                    Medical Decision Making Amount and/or Complexity of Data Reviewed Radiology: ordered.  Risk Prescription drug management.   The patient's exam is very unremarkable, his vital signs are normal, he is not tachypneic hypoxic tachycardic or febrile and his lung exam is unremarkable as well.  I have compared his x-ray from yesterday to the x-ray from February of this same year and despite being 4 months apart there appears to be an infiltrate or an abnormality in the same location on both x-rays.  CT scan will be ordered to make sure that this is not related to a pulmonary infarct on mass or persistent pneumonia.  The patient is otherwise very stable appearing.  In fact he does not complain much of having any shortness of breath minimal cough if any and no objective fevers.  CBC and metabolic panel from yesterday were unremarkable with no leukocytosis, he does not need a metabolic panel repeated today as he had normal kidney function at that time   Radiology Imaging: I personally viewed the images of the ordered radiographic studies and find small areas of pneumonia, no masses, no pneumothorax, no PE I agree with the radiologist interpretation as well   I have discussed with the patient at the bedside the results, and the meaning of these results.  They have had opportunity to ask questions,  expressed their understanding to the need for follow-up with primary care physician     Final diagnoses:  Pneumonia of left lung due to infectious organism, unspecified part of lung    ED Discharge Orders          Ordered    amoxicillin -clavulanate (AUGMENTIN ) 875-125 MG tablet  Every 12 hours        10/24/23 1643               Cleotilde Rogue, MD 10/24/23 1644

## 2023-10-24 NOTE — Discharge Instructions (Signed)
 Your blood work from yesterday was essentially normal, the CT scan here shows that there are couple of small pneumonias that are starting so we want you to take the antibiotics including Augmentin  twice a day for 7 days and the Zithromax  which was called into the pharmacy last night.  You can pick these up right away, take your first dose tonight before you go to bed  Thank you for allowing us  to treat you in the emergency department today.  After reviewing your examination and potential testing that was done it appears that you are safe to go home.  I would like for you to follow-up with your doctor within the next several days, have them obtain your records and follow-up with them to review all potential tests and results from your visit.  If you should develop severe or worsening symptoms return to the emergency department immediately

## 2023-11-17 ENCOUNTER — Emergency Department (HOSPITAL_COMMUNITY)

## 2023-11-17 ENCOUNTER — Emergency Department (HOSPITAL_COMMUNITY)
Admission: EM | Admit: 2023-11-17 | Discharge: 2023-11-17 | Disposition: A | Discharge status: Home / Self Care | Attending: Student | Admitting: Student

## 2023-11-17 ENCOUNTER — Other Ambulatory Visit: Payer: Self-pay

## 2023-11-17 DIAGNOSIS — Z7901 Long term (current) use of anticoagulants: Secondary | ICD-10-CM | POA: Insufficient documentation

## 2023-11-17 DIAGNOSIS — M79644 Pain in right finger(s): Secondary | ICD-10-CM | POA: Diagnosis present

## 2023-11-17 DIAGNOSIS — L03019 Cellulitis of unspecified finger: Secondary | ICD-10-CM

## 2023-11-17 DIAGNOSIS — J4489 Other specified chronic obstructive pulmonary disease: Secondary | ICD-10-CM | POA: Insufficient documentation

## 2023-11-17 DIAGNOSIS — L03011 Cellulitis of right finger: Secondary | ICD-10-CM | POA: Insufficient documentation

## 2023-11-17 DIAGNOSIS — Z87891 Personal history of nicotine dependence: Secondary | ICD-10-CM | POA: Insufficient documentation

## 2023-11-17 MED ORDER — CEPHALEXIN 500 MG PO CAPS
500.0000 mg | ORAL_CAPSULE | Freq: Once | ORAL | Status: AC
  Administered 2023-11-17: 500 mg via ORAL
  Filled 2023-11-17: qty 1

## 2023-11-17 MED ORDER — CEFADROXIL 500 MG PO CAPS: 500.0000 mg | ORAL_CAPSULE | Freq: Two times a day (BID) | ORAL | 0 refills | Status: AC

## 2023-11-17 MED ORDER — MUPIROCIN 2 % EX OINT
TOPICAL_OINTMENT | Freq: Once | CUTANEOUS | Status: AC
  Administered 2023-11-17: 1 via NASAL
  Filled 2023-11-17: qty 22

## 2023-11-17 MED ORDER — LIDOCAINE-EPINEPHRINE (PF) 2 %-1:200000 IJ SOLN
10.0000 mL | Freq: Once | INTRAMUSCULAR | Status: AC
  Administered 2023-11-17: 10 mL via INTRADERMAL
  Filled 2023-11-17: qty 20

## 2023-11-17 MED ORDER — MUPIROCIN CALCIUM 2 % EX CREA: 1.0000 | TOPICAL_CREAM | Freq: Two times a day (BID) | CUTANEOUS | 0 refills | Status: DC

## 2023-11-17 NOTE — ED Triage Notes (Addendum)
 Pt c/o right middle finger pain x1 week. Swelling noted, pt states he was using a metal grinder and thinks he may have gotten something in his finger. Reports he bought some amoxicillin  from a friend yesterday and thinks he may have taken 5 doses today.
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
## 2023-11-17 NOTE — ED Notes (Signed)
 ED Provider at bedside.

## 2023-12-02 ENCOUNTER — Ambulatory Visit (INDEPENDENT_AMBULATORY_CARE_PROVIDER_SITE_OTHER): Admitting: Orthopedic Surgery

## 2023-12-02 ENCOUNTER — Encounter: Payer: Self-pay | Admitting: Orthopedic Surgery

## 2023-12-02 VITALS — BP 131/84 | Ht 74.0 in | Wt 205.0 lb

## 2023-12-02 DIAGNOSIS — L03019 Cellulitis of unspecified finger: Secondary | ICD-10-CM

## 2023-12-02 DIAGNOSIS — L03011 Cellulitis of right finger: Secondary | ICD-10-CM

## 2023-12-02 NOTE — Addendum Note (Signed)
 Addended byBETHA JENEAN GREIG LELON on: 12/02/2023 12:31 PM   Modules accepted: Orders

## 2023-12-02 NOTE — Addendum Note (Signed)
 Addended byBETHA JENEAN GREIG LELON on: 12/02/2023 12:15 PM   Modules accepted: Orders

## 2023-12-02 NOTE — Patient Instructions (Signed)
 Your surgery will be at [facility] by Dr Margrette on 12/08/23  The hospital will contact you with a preoperative appointment to discuss Anesthesia.  Please arrive on time or 15 minutes early for the preoperative appointment, they have a very tight schedule if you are late or do not come in your surgery will be cancelled.  The phone number is [phone]. Please bring your medications with you for the appointment. They will tell you the arrival time and medication instructions when you have your preoperative evaluation. Do not wear nail polish the day of your surgery and if you take Phentermine you need to stop this medication ONE WEEK prior to your surgery. If you take Invokana, Farxiga, Jardiance, or Steglatro) - Hold 72 hours before the procedure.  If you take Ozempic,  Mounjaro, Bydureon or Trulicity do not take for 8 days before your surgery. If you take Victoza, Rybelsis, Saxenda or Adlyxi stop 24 hours before the procedure.  Please arrive at the hospital 2 hours before procedure if scheduled at 9:30 or later in the day or at the time the nurse tells you at your preoperative visit.   If you have my chart do not use the time given in my chart use the time given to you by the nurse during your preoperative visit.   Your surgery  time may change. Please be available for phone calls the day of your surgery and the day before. The Short Stay department may need to discuss changes about your surgery time. Not reaching the you could lead to procedure delays and possible cancellation.  You must have a ride home and someone to stay with you for 24 to 48 hours. The person taking you home will receive and sign for the your discharge instructions.  Please be prepared to give your support person's name and telephone number to [facility]. Dr Margrette will need that name and phone number post procedure.

## 2023-12-02 NOTE — Patient Instructions (Signed)
 [name]  12/02/2023     @[email or web address] @   Your procedure is scheduled on 12/08/2023.   Report to [facility] at  1100 A.M.   Call this number if you have problems the morning of surgery:  [phone]  If you experience any cold or flu symptoms such as cough, fever, chills, shortness of breath, etc. between now and your scheduled surgery, please notify us  at the above number.   Remember:         Your last dose of xarelto  should be on 12/04/2023.   Do not eat after midnight.   You may drink clear liquids until 0900 am on 12/08/2023.    Clear liquids allowed are:                    Water, Juice (No red color; non-citric and without pulp; diabetics please choose diet or no sugar options), Carbonated beverages (diabetics please choose diet or no sugar options), Clear Tea (No creamer, milk, or cream, including half & half and powdered creamer), Black Coffee Only (No creamer, milk or cream, including half & half and powdered creamer), and Clear Sports drink (No red color; diabetics please choose diet or no sugar options)    Take these medicines the morning of surgery with A SIP OF WATER                                                    omeprazole.    Do not wear jewelry, make-up or nail polish, including gel polish,  artificial nails, or any other type of covering on natural nails (fingers and  toes).  Do not wear lotions, powders, or perfumes, or deodorant.  Do not shave 48 hours prior to surgery.  Men may shave face and neck.  Do not bring valuables to the hospital.  [facility] is not responsible for any belongings or valuables.  Contacts, dentures or bridgework may not be worn into surgery.  Leave your suitcase in the car.  After surgery it may be brought to your room.  For patients admitted to the hospital, discharge time will be determined by your treatment team.  Patients discharged the day of surgery will not be allowed to drive home and must have someone  with them for 24 hours.    Special instructions:   DO NOT smoke tobacco or vape for 24 hours before your procedure.  Please read over the following fact sheets that you were given. Pain Booklet, Coughing and Deep Breathing, Surgical Site Infection Prevention, Anesthesia Post-op Instructions, and Care and Recovery After Surgery          Incision and Drainage, Care After After incision and drainage, it is common to have: Pain or discomfort around the incision site. Blood, fluid, or pus (drainage) from the incision. Redness and firm skin around the incision site. Follow these instructions at home: Medicines Take over-the-counter and prescription medicines only as told by your health care provider. If you were prescribed antibiotics, take them as told by your provider. Do not stop using the antibiotic even if you start to feel better. Do not apply creams, ointments, or liquids unless you have been told to by your provider. Wound care Follow instructions from your provider about how to take care of your wound. Make sure you:  Wash your hands with soap and water for at least 20 seconds before and after you change your bandage (dressing). If soap and water are not available, use hand sanitizer. Change your dressing and any packing as told by your provider. If the dressing is dry or stuck when you try to remove it, moisten or wet it with saline or water. This will help you remove it without harming your skin or tissues. If your wound is packed, leave it in place until your provider tells you to remove it. To remove it, moisten or wet the packing with saline or water. Leave stitches (sutures), skin glue, or tape strips in place. These skin closures may need to stay in place for 2 weeks or longer. If tape strip edges start to loosen and curl up, you may trim the loose edges. Do not remove tape strips completely unless your provider tells you to do that. Check your wound every day for signs of  infection. Check for: More redness, swelling, or pain. More fluid or blood. Warmth. Pus or a bad smell. If you were sent home with a drain tube in place, follow instructions from your provider about: How to empty it. How to care for it at home. Be careful when you get rid of used dressings, wound packing, or drainage. Activity Rest the affected area. Return to your normal activities as told by your provider. Ask your provider what activities are safe for you. General instructions Do not use any products that contain nicotine  or tobacco. These products include cigarettes, chewing tobacco, and vaping devices, such as e-cigarettes. These can delay incision healing after surgery. If you need help quitting, ask your provider. Do not take baths, swim, or use a hot tub until your provider approves. Ask your provider if you may take showers. You may only be allowed to take sponge baths. The incision will keep draining. It is normal to have some clear or slightly bloody drainage. The amount of drainage should go down each day. Keep all follow-up visits. Your provider will need to make sure that your incision is healing well and that there are no problems. Your health care provider may give you more instructions. Make sure you know what you can and cannot do Contact a health care provider if: Your cyst or abscess comes back. You have any signs of infection. You notice red streaks that spread away from the incision site. You have a fever or chills. Get help right away if: You have severe pain or bleeding. You become short of breath. You have chest pain. You have signs of a severe infection. You may notice changes in your incision area, such as: Swelling that makes the skin feel hard. Numbness or tingling. Sudden increase in redness. Your skin color may change from red to purple, and then to dark spots. Blisters, ulcers, or splitting of the skin. These symptoms may be an emergency. Get help right  away. Call 911. Do not wait to see if the symptoms will go away. Do not drive yourself to the hospital. This information is not intended to replace advice given to you by your health care provider. Make sure you discuss any questions you have with your health care provider. Document Revised: 11/18/2021 Document Reviewed: 11/18/2021 Elsevier Patient Education  2024 [employer]General Anesthesia, Adult, Care After The following information offers guidance on how to care for yourself after your procedure. Your health care provider may also give you more specific instructions. If you have problems or  questions, contact your health care provider. What can I expect after the procedure? After the procedure, it is common for people to: Have pain or discomfort at the IV site. Have nausea or vomiting. Have a sore throat or hoarseness. Have trouble concentrating. Feel cold or chills. Feel weak, sleepy, or tired (fatigue). Have soreness and body aches. These can affect parts of the body that were not involved in surgery. Follow these instructions at home: For the time period you were told by your health care provider:  Rest. Do not participate in activities where you could fall or become injured. Do not drive or use machinery. Do not drink alcohol. Do not take sleeping pills or medicines that cause drowsiness. Do not make important decisions or sign legal documents. Do not take care of children on your own. General instructions Drink enough fluid to keep your urine pale yellow. If you have sleep apnea, surgery and certain medicines can increase your risk for breathing problems. Follow instructions from your health care provider about wearing your sleep device: Anytime you are sleeping, including during daytime naps. While taking prescription pain medicines, sleeping medicines, or medicines that make you drowsy. Return to your normal activities as told by your health care provider. Ask your  health care provider what activities are safe for you. Take over-the-counter and prescription medicines only as told by your health care provider. Do not use any products that contain nicotine  or tobacco. These products include cigarettes, chewing tobacco, and vaping devices, such as e-cigarettes. These can delay incision healing after surgery. If you need help quitting, ask your health care provider. Contact a health care provider if: You have nausea or vomiting that does not get better with medicine. You vomit every time you eat or drink. You have pain that does not get better with medicine. You cannot urinate or have bloody urine. You develop a skin rash. You have a fever. Get help right away if: You have trouble breathing. You have chest pain. You vomit blood. These symptoms may be an emergency. Get help right away. Call 911. Do not wait to see if the symptoms will go away. Do not drive yourself to the hospital. Summary After the procedure, it is common to have a sore throat, hoarseness, nausea, vomiting, or to feel weak, sleepy, or fatigue. For the time period you were told by your health care provider, do not drive or use machinery. Get help right away if you have difficulty breathing, have chest pain, or vomit blood. These symptoms may be an emergency. This information is not intended to replace advice given to you by your health care provider. Make sure you discuss any questions you have with your health care provider. Document Revised: 06/28/2021 Document Reviewed: 06/28/2021 Elsevier Patient Education  2024 [employer].How to Use Chlorhexidine  at Home in the Shower Chlorhexidine  gluconate (CHG) is a germ-killing (antiseptic) wash that's used to clean the skin. It can get rid of the germs that normally live on the skin and can keep them away for about 24 hours. If you're having surgery, you may be told to shower with CHG at home the night before surgery. This can help lower your  risk for infection. To use CHG wash in the shower, follow the steps below. Supplies needed: CHG body wash. Clean washcloth. Clean towel. How to use CHG in the shower Follow these steps unless you're told to use CHG in a different way: Start the shower. Use your normal soap and shampoo to wash your  face and hair. Turn off the shower or move out of the shower stream. Pour CHG onto a clean washcloth. Do not use any type of brush or rough sponge. Start at your neck, washing your body down to your toes. Make sure you: Wash the part of your body where the surgery will be done for at least 1 minute. Do not scrub. Do not use CHG on your head or face unless your health care provider tells you to. If it gets into your ears or eyes, rinse them well with water. Do not wash your genitals with CHG. Wash your back and under your arms. Make sure to wash skin folds. Let the CHG sit on your skin for 1-2 minutes or as long as told. Rinse your entire body in the shower, including all body creases and folds. Turn off the shower. Dry off with a clean towel. Do not put anything on your skin afterward, such as powder, lotion, or perfume. Put on clean clothes or pajamas. If it's the night before surgery, sleep in clean sheets. General tips Use CHG only as told, and follow the instructions on the label. Use the full amount of CHG as told. This is often one bottle. Do not smoke and stay away from flames after using CHG. Your skin may feel sticky after using CHG. This is normal. The sticky feeling will go away as the CHG dries. Do not use CHG: If you have a chlorhexidine  allergy or have reacted to chlorhexidine  in the past. On open wounds or areas of skin that have broken skin, cuts, or scrapes. On babies younger than 65 months of age. Contact a health care provider if: You have questions about using CHG. Your skin gets irritated or itchy. You have a rash after using CHG. You swallow any CHG. Call your local  poison control center [phone] in the [place] Your eyes itch badly, or they become very red or swollen. Your hearing changes. You have trouble seeing. If you can't reach your provider, go to an urgent care or emergency room. Do not drive yourself. Get help right away if: You have swelling or tingling in your mouth or throat. You make high-pitched whistling sounds when you breathe, most often when you breathe out (wheeze). You have trouble breathing. These symptoms may be an emergency. Call 911 right away. Do not wait to see if the symptoms will go away. Do not drive yourself to the hospital. This information is not intended to replace advice given to you by your health care provider. Make sure you discuss any questions you have with your health care provider. Document Revised: 10/14/2022 Document Reviewed: 10/10/2021 Elsevier Patient Education  2024 [employer].How to Use an Incentive Spirometer An incentive spirometer is a tool that measures how well you are filling your lungs with each breath. Learning to take long, deep breaths using this tool can help you keep your lungs clear and active. This may help to reverse or lessen your chance of developing breathing (pulmonary) problems, especially infection. You may be asked to use a spirometer: After a surgery. If you have a lung problem or a history of smoking. After a long period of time when you have been unable to move or be active. If the spirometer includes an indicator to show the highest number that you have reached, your health care provider or respiratory therapist will help you set a goal. Keep a log of your progress as told by your health care provider. What are the  risks? Breathing too quickly may cause dizziness or cause you to pass out. Take your time so you do not get dizzy or light-headed. If you are in pain, you may need to take pain medicine before doing incentive spirometry. It is harder to take a deep breath if you are  having pain. How to use your incentive spirometer  Sit up on the edge of your bed or on a chair. Hold the incentive spirometer so that it is in an upright position. Before you use the spirometer, breathe out normally. Place the mouthpiece in your mouth. Make sure your lips are closed tightly around it. Breathe in slowly and as deeply as you can through your mouth, causing the piston or the ball to rise toward the top of the chamber. Hold your breath for 3-5 seconds, or for as long as possible. If the spirometer includes a coach indicator, use this to guide you in breathing. Slow down your breathing if the indicator goes above the marked areas. Remove the mouthpiece from your mouth and breathe out normally. The piston or ball will return to the bottom of the chamber. Rest for a few seconds, then repeat the steps 10 or more times. Take your time and take a few normal breaths between deep breaths so that you do not get dizzy or light-headed. Do this every 1-2 hours when you are awake. If the spirometer includes a goal marker to show the highest number you have reached (best effort), use this as a goal to work toward during each repetition. After each set of 10 deep breaths, cough a few times. This will help to make sure that your lungs are clear. If you have an incision on your chest or abdomen from surgery, place a pillow or a rolled-up towel firmly against the incision when you cough. This can help to reduce pain while taking deep breaths and coughing. General tips When you are able to get out of bed: Walk around often. Continue to take deep breaths and cough in order to clear your lungs. Keep using the incentive spirometer until your health care provider says it is okay to stop using it. If you have been in the hospital, you may be told to keep using the spirometer at home. Contact a health care provider if: You are having difficulty using the spirometer. You have trouble using the spirometer  as often as instructed. Your pain medicine is not giving enough relief for you to use the spirometer as told. You have a fever. Get help right away if: You develop shortness of breath. You develop a cough with bloody mucus from the lungs. You have fluid or blood coming from an incision site after you cough. Summary An incentive spirometer is a tool that can help you learn to take long, deep breaths to keep your lungs clear and active. You may be asked to use a spirometer after a surgery, if you have a lung problem or a history of smoking, or if you have been inactive for a long period of time. Use your incentive spirometer as instructed every 1-2 hours while you are awake. If you have an incision on your chest or abdomen, place a pillow or a rolled-up towel firmly against your incision when you cough. This will help to reduce pain. Get help right away if you have shortness of breath, you cough up bloody mucus, or blood comes from your incision when you cough. This information is not intended to replace advice given to  you by your health care provider. Make sure you discuss any questions you have with your health care provider. Document Revised: 02/06/2023 Document Reviewed: 02/06/2023 Elsevier Patient Education  2024 [employer].
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
## 2023-12-02 NOTE — Progress Notes (Signed)
  Intake history:  BP 131/84 Comment: 11/17/23  Ht 6' 2 (1.88 m)   Wt 205 lb (93 kg)   BMI 26.32 kg/m  Body mass index is 26.32 kg/m.    WHAT ARE WE SEEING YOU FOR TODAY?   right (3rd) finger(s)  How long has this bothered you? (DOI?DOS?WS?)  Long duration it has bothered him went to ER on 11/17/23 and it improved some but now is getting painful swollen again   Was there an injury? No  Anticoag.  Yes  Diabetes No  Heart disease Yes  Hypertension Yes  SMOKING HX No  Kidney disease No  Any ALLERGIES ______________________________________________   Treatment:  Have you taken:  Tylenol  No Bactroban  cream and ? Duricef _________

## 2023-12-04 ENCOUNTER — Encounter (HOSPITAL_COMMUNITY)
Admission: RE | Admit: 2023-12-04 | Discharge: 2023-12-04 | Disposition: A | Discharge status: Home / Self Care | Source: Ambulatory Visit | Attending: Orthopedic Surgery | Admitting: Orthopedic Surgery

## 2023-12-04 ENCOUNTER — Encounter (HOSPITAL_COMMUNITY): Payer: Self-pay

## 2023-12-04 VITALS — BP 131/84 | HR 70 | Resp 18 | Ht 74.0 in | Wt 205.0 lb

## 2023-12-04 DIAGNOSIS — Z01818 Encounter for other preprocedural examination: Secondary | ICD-10-CM

## 2023-12-04 DIAGNOSIS — Z01812 Encounter for preprocedural laboratory examination: Secondary | ICD-10-CM | POA: Diagnosis present

## 2023-12-04 DIAGNOSIS — L03019 Cellulitis of unspecified finger: Secondary | ICD-10-CM | POA: Insufficient documentation

## 2023-12-04 HISTORY — DX: Cerebral infarction, unspecified: I63.9

## 2023-12-04 LAB — CBC WITH DIFFERENTIAL/PLATELET
Abs Immature Granulocytes: 0.01 K/uL (ref 0.00–0.07)
Basophils Absolute: 0.1 K/uL (ref 0.0–0.1)
Basophils Relative: 1 %
Eosinophils Absolute: 0.2 K/uL (ref 0.0–0.5)
Eosinophils Relative: 4 %
HCT: 40.1 % (ref 39.0–52.0)
Hemoglobin: 13.4 g/dL (ref 13.0–17.0)
Immature Granulocytes: 0 %
Lymphocytes Relative: 35 %
Lymphs Abs: 1.8 K/uL (ref 0.7–4.0)
MCH: 30.8 pg (ref 26.0–34.0)
MCHC: 33.4 g/dL (ref 30.0–36.0)
MCV: 92.2 fL (ref 80.0–100.0)
Monocytes Absolute: 0.4 K/uL (ref 0.1–1.0)
Monocytes Relative: 8 %
Neutro Abs: 2.7 K/uL (ref 1.7–7.7)
Neutrophils Relative %: 52 %
Platelets: 209 K/uL (ref 150–400)
RBC: 4.35 MIL/uL (ref 4.22–5.81)
RDW: 12.8 % (ref 11.5–15.5)
WBC: 5.3 K/uL (ref 4.0–10.5)
nRBC: 0 % (ref 0.0–0.2)

## 2023-12-04 LAB — BASIC METABOLIC PANEL WITH GFR
Anion gap: 9 (ref 5–15)
BUN: 26 mg/dL — ABNORMAL HIGH (ref 8–23)
CO2: 23 mmol/L (ref 22–32)
Calcium: 9 mg/dL (ref 8.9–10.3)
Chloride: 104 mmol/L (ref 98–111)
Creatinine, Ser: 1.03 mg/dL (ref 0.61–1.24)
GFR, Estimated: >60 mL/min (ref >60)
Glucose, Bld: 110 mg/dL — ABNORMAL HIGH (ref 70–99)
Potassium: 3.5 mmol/L (ref 3.5–5.1)
Sodium: 136 mmol/L (ref 135–145)

## 2023-12-04 LAB — RAPID URINE DRUG SCREEN, HOSP PERFORMED
Amphetamines: POSITIVE — AB
Barbiturates: NOT DETECTED
Benzodiazepines: POSITIVE — AB
Cocaine: NOT DETECTED
Opiates: POSITIVE — AB
Tetrahydrocannabinol: NOT DETECTED

## 2023-12-07 ENCOUNTER — Ambulatory Visit (INDEPENDENT_AMBULATORY_CARE_PROVIDER_SITE_OTHER): Admitting: Orthopedic Surgery

## 2023-12-07 DIAGNOSIS — L03011 Cellulitis of right finger: Secondary | ICD-10-CM

## 2023-12-07 MED ORDER — MUPIROCIN CALCIUM 2 % EX CREA: 1.0000 | TOPICAL_CREAM | Freq: Two times a day (BID) | CUTANEOUS | 1 refills | Status: AC

## 2023-12-07 NOTE — Progress Notes (Signed)
 Chief Complaint  Patient presents with   Hand Pain    Right middle - supposed to have surgery tomorrow but has been on a lot of antibiotics and thinks its a lot better    [name] comes in for me to check his right middle finger he was scheduled for surgery tomorrow but says it looks very good and he is considering that it may not need surgery it does look good there is no purulent drainage she just has a little bit of erythema on the volar aspect without much tenderness  Recommend  I told him to come in tomorrow if the finger is any worse but otherwise if I do not see him by 10:00 at the hospital we will cancel his 1:00 surgery  Meds ordered this encounter  Medications   mupirocin  cream (BACTROBAN ) 2 %    Sig: Apply 1 Application topically 2 (two) times daily.    Dispense:  15 g    Refill:  1

## 2023-12-08 ENCOUNTER — Ambulatory Visit (HOSPITAL_COMMUNITY): Admission: RE | Admit: 2023-12-08 | Source: Home / Self Care | Admitting: Orthopedic Surgery

## 2023-12-08 ENCOUNTER — Encounter (HOSPITAL_COMMUNITY): Admission: RE | Payer: Self-pay | Source: Home / Self Care

## 2023-12-08 SURGERY — INCISION AND DRAINAGE, ABSCESS
Anesthesia: Choice | Laterality: Right

## 2024-01-23 ENCOUNTER — Other Ambulatory Visit: Payer: Self-pay

## 2024-01-23 ENCOUNTER — Encounter (HOSPITAL_COMMUNITY): Payer: Self-pay

## 2024-01-23 ENCOUNTER — Emergency Department (HOSPITAL_COMMUNITY)

## 2024-01-23 ENCOUNTER — Emergency Department (HOSPITAL_COMMUNITY)
Admission: EM | Admit: 2024-01-23 | Discharge: 2024-01-23 | Disposition: A | Discharge status: Home / Self Care | Attending: Emergency Medicine | Admitting: Emergency Medicine

## 2024-01-23 DIAGNOSIS — J181 Lobar pneumonia, unspecified organism: Secondary | ICD-10-CM | POA: Diagnosis not present

## 2024-01-23 DIAGNOSIS — J449 Chronic obstructive pulmonary disease, unspecified: Secondary | ICD-10-CM | POA: Insufficient documentation

## 2024-01-23 DIAGNOSIS — J189 Pneumonia, unspecified organism: Secondary | ICD-10-CM

## 2024-01-23 DIAGNOSIS — Z7951 Long term (current) use of inhaled steroids: Secondary | ICD-10-CM | POA: Insufficient documentation

## 2024-01-23 DIAGNOSIS — R059 Cough, unspecified: Secondary | ICD-10-CM | POA: Diagnosis present

## 2024-01-23 DIAGNOSIS — Z7901 Long term (current) use of anticoagulants: Secondary | ICD-10-CM | POA: Insufficient documentation

## 2024-01-23 LAB — CBC WITH DIFFERENTIAL/PLATELET
Abs Immature Granulocytes: 0.01 K/uL (ref 0.00–0.07)
Basophils Absolute: 0 K/uL (ref 0.0–0.1)
Basophils Relative: 1 %
Eosinophils Absolute: 0.1 K/uL (ref 0.0–0.5)
Eosinophils Relative: 2 %
HCT: 34 % — ABNORMAL LOW (ref 39.0–52.0)
Hemoglobin: 11.3 g/dL — ABNORMAL LOW (ref 13.0–17.0)
Immature Granulocytes: 0 %
Lymphocytes Relative: 24 %
Lymphs Abs: 1.4 K/uL (ref 0.7–4.0)
MCH: 30.3 pg (ref 26.0–34.0)
MCHC: 33.2 g/dL (ref 30.0–36.0)
MCV: 91.2 fL (ref 80.0–100.0)
Monocytes Absolute: 0.4 K/uL (ref 0.1–1.0)
Monocytes Relative: 8 %
Neutro Abs: 3.8 K/uL (ref 1.7–7.7)
Neutrophils Relative %: 65 %
Platelets: 274 K/uL (ref 150–400)
RBC: 3.73 MIL/uL — ABNORMAL LOW (ref 4.22–5.81)
RDW: 13.1 % (ref 11.5–15.5)
WBC: 5.7 K/uL (ref 4.0–10.5)
nRBC: 0 % (ref 0.0–0.2)

## 2024-01-23 LAB — COMPREHENSIVE METABOLIC PANEL WITH GFR
ALT: 19 U/L (ref 0–44)
AST: 15 U/L (ref 15–41)
Albumin: 3.5 g/dL (ref 3.5–5.0)
Alkaline Phosphatase: 65 U/L (ref 38–126)
Anion gap: 9 (ref 5–15)
BUN: 10 mg/dL (ref 8–23)
CO2: 27 mmol/L (ref 22–32)
Calcium: 9.4 mg/dL (ref 8.9–10.3)
Chloride: 103 mmol/L (ref 98–111)
Creatinine, Ser: 0.78 mg/dL (ref 0.61–1.24)
GFR, Estimated: >60 mL/min (ref >60)
Glucose, Bld: 115 mg/dL — ABNORMAL HIGH (ref 70–99)
Potassium: 4 mmol/L (ref 3.5–5.1)
Sodium: 140 mmol/L (ref 135–145)
Total Bilirubin: 0.3 mg/dL (ref 0.0–1.2)
Total Protein: 6.9 g/dL (ref 6.5–8.1)

## 2024-01-23 MED ORDER — LEVOFLOXACIN 750 MG PO TABS: 750.0000 mg | ORAL_TABLET | Freq: Every day | ORAL | 0 refills | Status: AC

## 2024-01-23 MED ORDER — PREDNISONE 50 MG PO TABS
60.0000 mg | ORAL_TABLET | Freq: Every day | ORAL | Status: DC
  Filled 2024-01-23: qty 1

## 2024-01-23 MED ORDER — LEVOFLOXACIN 750 MG PO TABS
750.0000 mg | ORAL_TABLET | Freq: Once | ORAL | Status: AC
  Administered 2024-01-23: 750 mg via ORAL
  Filled 2024-01-23: qty 1

## 2024-01-23 MED ORDER — IOHEXOL 350 MG/ML SOLN
75.0000 mL | Freq: Once | INTRAVENOUS | Status: AC | PRN
  Administered 2024-01-23: 75 mL via INTRAVENOUS

## 2024-01-23 MED ORDER — PREDNISONE 50 MG PO TABS
60.0000 mg | ORAL_TABLET | Freq: Every day | ORAL | Status: DC
  Administered 2024-01-23: 60 mg via ORAL

## 2024-01-23 MED ORDER — PREDNISONE 50 MG PO TABS: ORAL_TABLET | ORAL | 0 refills | Status: DC

## 2024-01-23 NOTE — ED Notes (Signed)
 Pt/family received d/c paperwork at this time. After going over the paperwork any questions, comments, or concerns were answered to the best of this nurse's knowledge. The pt/family verbally acknowledged the teachings/instructions.   Pt did not want VS taken at DC

## 2024-01-23 NOTE — ED Triage Notes (Signed)
 Pt to er, pt states that he thinks that he has pneumonia, states that he has been coughing up blood for the past two weeks, states that he has been feeling poorly for longer. Pt talking in full sentences.

## 2024-01-23 NOTE — Discharge Instructions (Addendum)
See your Physician for recheck.  °

## 2024-01-23 NOTE — ED Notes (Signed)
 Patient transported to CT

## 2024-01-23 NOTE — ED Provider Notes (Signed)
 [place] EMERGENCY DEPARTMENT AT [facility] Provider Note   CSN: [id number] Arrival date & time: 01/23/24  1311     Patient presents with: Cough   [name] is a 63 y.o. male.   Patient complains of feeling like he has pneumonia.  Patient states that he has had a history of pneumonia in the past.  Patient also has a history of a pulmonary embolus.  Patient states that he feels like he has pneumonia again.  Patient reports when he has coughed for the last 3 days he has been coughing up blood.  Patient reports fever on and off.  He has had some increasing shortness of breath.  Patient has a past medical history of pneumonia COPD, osteoarthritis, protein as deficiency and anxiety.  Patient states he is currently alcohol and drug free.  The history is provided by the patient. No language interpreter was used.  Cough Sputum characteristics:  Bloody      Prior to Admission medications   Medication Sig Start Date End Date Taking? Authorizing Provider  levofloxacin  (LEVAQUIN ) 750 MG tablet Take 1 tablet (750 mg total) by mouth daily for 10 days. 01/23/24 02/02/24 Yes Maleeka Sabatino K, PA-C  predniSONE  (DELTASONE ) 50 MG tablet One tablet a day 01/23/24  Yes Mee Macdonnell K, PA-C  albuterol  (VENTOLIN  HFA) 108 (90 Base) MCG/ACT inhaler Inhale 1-2 puffs into the lungs every 6 (six) hours as needed for wheezing or shortness of breath. Patient not taking: Reported on 12/02/2023 06/07/22   Small, Brooke L, PA  atorvastatin  (LIPITOR) 10 MG tablet Take 10 mg by mouth daily. 06/11/21   [provider]  BREZTRI AEROSPHERE 160-9-4.8 MCG/ACT AERO inhaler Inhale 2 puffs into the lungs daily as needed (shortness of breath). 07/08/23   [provider]  cyanocobalamin (VITAMIN B12) 500 MCG tablet Take 500 mcg by mouth daily.    [provider]  milk thistle 175 MG tablet Take 175 mg by mouth daily.    [provider]  mupirocin  cream (BACTROBAN ) 2 % Apply 1  Application topically 2 (two) times daily. 12/07/23   Margrette Taft BRAVO, MD  omeprazole (PRILOSEC) 40 MG capsule Take 40 mg by mouth daily. 04/21/22   [provider]  rivaroxaban  (XARELTO ) 20 MG TABS tablet Take 20 mg by mouth daily.    [provider]    Allergies: Patient has no known allergies.    Review of Systems  Respiratory:  Positive for cough.   All other systems reviewed and are negative.   Updated Vital Signs BP 111/74 (BP Location: Right Arm)   Pulse 70   Temp 98.3 F (36.8 C) (Oral)   Resp 18   Ht 5' 2 (1.575 m)   Wt 78.2 kg   SpO2 97%   BMI 31.53 kg/m   Physical Exam Vitals and nursing note reviewed.  Constitutional:      Appearance: He is well-developed.  HENT:     Head: Normocephalic.     Right Ear: External ear normal.     Left Ear: External ear normal.     Nose: Nose normal.     Mouth/Throat:     Mouth: Mucous membranes are moist.  Eyes:     Pupils: Pupils are equal, round, and reactive to light.  Cardiovascular:     Rate and Rhythm: Normal rate.  Pulmonary:     Effort: Pulmonary effort is normal.     Breath sounds: Rhonchi present.  Abdominal:  General: There is no distension.  Musculoskeletal:        General: Normal range of motion.     Cervical back: Normal range of motion.  Skin:    General: Skin is warm.  Neurological:     General: No focal deficit present.     Mental Status: He is alert and oriented to person, place, and time.  Psychiatric:        Mood and Affect: Mood normal.     (all labs ordered are listed, but only abnormal results are displayed) Labs Reviewed  CBC WITH DIFFERENTIAL/PLATELET - Abnormal; Notable for the following components:      Result Value   RBC 3.73 (*)    Hemoglobin 11.3 (*)    HCT 34.0 (*)    All other components within normal limits  COMPREHENSIVE METABOLIC PANEL WITH GFR - Abnormal; Notable for the following components:   Glucose, Bld 115 (*)    All other components within  normal limits    EKG: None  Radiology: CT Angio Chest PE W and/or Wo Contrast Result Date: 01/23/2024 CLINICAL DATA:  Hemoptysis. EXAM: CT ANGIOGRAPHY CHEST WITH CONTRAST TECHNIQUE: Multidetector CT imaging of the chest was performed using the standard protocol during bolus administration of intravenous contrast. Multiplanar CT image reconstructions and MIPs were obtained to evaluate the vascular anatomy. RADIATION DOSE REDUCTION: This exam was performed according to the departmental dose-optimization program which includes automated exposure control, adjustment of the mA and/or kV according to patient size and/or use of iterative reconstruction technique. CONTRAST:  75mL OMNIPAQUE  IOHEXOL  350 MG/ML SOLN COMPARISON:  October 24, 2023 FINDINGS: Cardiovascular: Satisfactory opacification of the pulmonary arteries to the segmental level. No evidence of pulmonary embolism. Normal heart size. No pericardial effusion. Mediastinum/Nodes: There is mild AP window lymphadenopathy. Thyroid gland, trachea, and esophagus demonstrate no significant findings. Lungs/Pleura: Moderate severity infiltrate and adjacent areas of atelectasis are seen within the left lower lobe. Mild anteromedial left upper lobe atelectasis and/or infiltrate is also noted. No pleural effusion or pneumothorax is identified. Upper Abdomen: Multiple surgical clips are seen within the gallbladder fossa. Musculoskeletal: No chest wall abnormality. No acute or significant osseous findings. Review of the MIP images confirms the above findings. IMPRESSION: 1. No evidence of pulmonary embolism. 2. Moderate severity left lower lobe infiltrate and adjacent areas of atelectasis. 3. Mild anteromedial left upper lobe atelectasis and/or infiltrate. 4. Mild AP window lymphadenopathy, likely reactive in nature. 5. Evidence of prior cholecystectomy. Electronically Signed   By: Suzen Dials M.D.   On: 01/23/2024 16:50   DG Chest 2 View Result Date:  01/23/2024 EXAM: 2 VIEW(S) XRAY OF THE CHEST 01/23/2024 01:37:00 PM COMPARISON: 10/23/2023 CLINICAL HISTORY: cough. Per chart: Pt to er, pt states that he thinks that he has pneumonia, states that he has been coughing up blood for the past two weeks, states that he has been feeling poorly for longer. Pt talking in full sentences FINDINGS: LUNGS AND PLEURA: Left lower lobe opacities are identified similar in distribution to the previous exam No pulmonary edema. No pleural effusion. No pneumothorax. HEART AND MEDIASTINUM: No acute abnormality of the cardiac and mediastinal silhouettes. BONES AND SOFT TISSUES: Thoracic degenerative changes. IMPRESSION: 1. Left lower lobe opacities, which in the acute setting are compatible with recurrent left lower lobe pneumonia and/or aspiration. This will require follow-up imaging to ensure resolution. In the absence of resolution further evaluation with CT of the chest is advised to assess for underlying malignancy Electronically signed by: Waddell Calk MD  01/23/2024 01:55 PM EDT RP Workstation: [id number]     Procedures   Medications Ordered in the ED  predniSONE  (DELTASONE ) tablet 60 mg (60 mg Oral Given 01/23/24 1726)  iohexol  (OMNIPAQUE ) 350 MG/ML injection 75 mL (75 mLs Intravenous Contrast Given 01/23/24 1620)  levofloxacin  (LEVAQUIN ) tablet 750 mg (750 mg Oral Given 01/23/24 1724)                                    Medical Decision Making Patient complains of a cough congestion and coughing up blood.  Patient is currently on a blood thinner.  Patient reports he has had similar symptoms in the past when he had pneumonia.  Amount and/or Complexity of Data Reviewed External Data Reviewed: notes.    Details: Previous ED notes reviewed patient has had pneumonia multiple times Labs: ordered. Decision-making details documented in ED Course.    Details: Labs ordered reviewed and interpreted.  Patient has a normal white blood cell count. Radiology: ordered  and independent interpretation performed. Decision-making details documented in ED Course.    Details: Chest x-ray showed left lower lobe opacities. CT scan shows left lower lobe pneumonia  Risk Prescription drug management. Risk Details: Patient refused EKG.  He does not feel like he needs.  Patient states he would like to have his liver functions checked as he was told in his 42s that he had cirrhosis.  Patient states he was told that he was high risk to develop liver cancer.  Patient's liver functions today are normal.  Patient does have pneumonia on his CT scan.  Patient is given a dose of Levaquin  here patient states that he normally ends up on a course of prednisone  as well.  Patient is given 60 mg of prednisone .  He is discharged with a prescription for Levaquin  and prednisone .  He is advised to follow-up with his primary care physician for recheck of pneumonia.  Patient is also advised he needs to discuss with his primary care physician he has concerns for cancer risk and to get appropriate screening.        Final diagnoses:  Pneumonia of left lower lobe due to infectious organism    ED Discharge Orders          Ordered    levofloxacin  (LEVAQUIN ) 750 MG tablet  Daily        01/23/24 1714    predniSONE  (DELTASONE ) 50 MG tablet        01/23/24 1720            An After Visit Summary was printed and given to the patient.    Flint Sonny POUR, [place] 01/23/24 1744    Cleotilde Rogue, MD 01/24/24 [phone]

## 2024-01-23 NOTE — ED Notes (Signed)
 Pt refused EKG, PA and RN aware

## 2024-01-25 ENCOUNTER — Other Ambulatory Visit (HOSPITAL_COMMUNITY): Payer: Self-pay | Admitting: Adult Health

## 2024-01-25 DIAGNOSIS — F17210 Nicotine dependence, cigarettes, uncomplicated: Secondary | ICD-10-CM

## 2024-02-06 ENCOUNTER — Encounter (HOSPITAL_COMMUNITY): Payer: Self-pay

## 2024-02-06 ENCOUNTER — Ambulatory Visit (HOSPITAL_COMMUNITY): Admission: RE | Admit: 2024-02-06 | Source: Ambulatory Visit

## 2024-02-29 ENCOUNTER — Other Ambulatory Visit: Payer: Self-pay | Admitting: *Deleted

## 2024-02-29 DIAGNOSIS — K432 Incisional hernia without obstruction or gangrene: Secondary | ICD-10-CM

## 2024-03-01 ENCOUNTER — Ambulatory Visit: Admitting: General Surgery

## 2024-03-30 ENCOUNTER — Other Ambulatory Visit (HOSPITAL_COMMUNITY): Payer: Self-pay | Admitting: Adult Health

## 2024-03-30 DIAGNOSIS — F17210 Nicotine dependence, cigarettes, uncomplicated: Secondary | ICD-10-CM

## 2024-04-25 ENCOUNTER — Emergency Department (HOSPITAL_COMMUNITY)
Admission: EM | Admit: 2024-04-25 | Discharge: 2024-04-25 | Disposition: A | Discharge status: Home / Self Care | Attending: Emergency Medicine | Admitting: Emergency Medicine

## 2024-04-25 ENCOUNTER — Emergency Department (HOSPITAL_COMMUNITY)

## 2024-04-25 ENCOUNTER — Encounter (HOSPITAL_COMMUNITY): Payer: Self-pay

## 2024-04-25 DIAGNOSIS — J189 Pneumonia, unspecified organism: Secondary | ICD-10-CM | POA: Diagnosis not present

## 2024-04-25 DIAGNOSIS — F17210 Nicotine dependence, cigarettes, uncomplicated: Secondary | ICD-10-CM | POA: Insufficient documentation

## 2024-04-25 DIAGNOSIS — J4489 Other specified chronic obstructive pulmonary disease: Secondary | ICD-10-CM | POA: Diagnosis not present

## 2024-04-25 DIAGNOSIS — R059 Cough, unspecified: Secondary | ICD-10-CM | POA: Diagnosis present

## 2024-04-25 LAB — COMPREHENSIVE METABOLIC PANEL WITH GFR
ALT: 10 U/L (ref 0–44)
AST: 14 U/L — ABNORMAL LOW (ref 15–41)
Albumin: 4 g/dL (ref 3.5–5.0)
Alkaline Phosphatase: 80 U/L (ref 38–126)
Anion gap: 9 (ref 5–15)
BUN: 15 mg/dL (ref 8–23)
CO2: 29 mmol/L (ref 22–32)
Calcium: 9.3 mg/dL (ref 8.9–10.3)
Chloride: 101 mmol/L (ref 98–111)
Creatinine, Ser: 0.9 mg/dL (ref 0.61–1.24)
GFR, Estimated: >60 mL/min
Glucose, Bld: 115 mg/dL — ABNORMAL HIGH (ref 70–99)
Potassium: 4.1 mmol/L (ref 3.5–5.1)
Sodium: 139 mmol/L (ref 135–145)
Total Bilirubin: 0.4 mg/dL (ref 0.0–1.2)
Total Protein: 7.7 g/dL (ref 6.5–8.1)

## 2024-04-25 LAB — TROPONIN T, HIGH SENSITIVITY
Troponin T High Sensitivity: <15 ng/L (ref 0–19)
Troponin T High Sensitivity: <15 ng/L (ref 0–19)

## 2024-04-25 LAB — CBC WITH DIFFERENTIAL/PLATELET
Abs Immature Granulocytes: 0.02 K/uL (ref 0.00–0.07)
Basophils Absolute: 0.1 K/uL (ref 0.0–0.1)
Basophils Relative: 1 %
Eosinophils Absolute: 0.1 K/uL (ref 0.0–0.5)
Eosinophils Relative: 1 %
HCT: 40.7 % (ref 39.0–52.0)
Hemoglobin: 13.2 g/dL (ref 13.0–17.0)
Immature Granulocytes: 0 %
Lymphocytes Relative: 21 %
Lymphs Abs: 1.6 K/uL (ref 0.7–4.0)
MCH: 29.5 pg (ref 26.0–34.0)
MCHC: 32.4 g/dL (ref 30.0–36.0)
MCV: 91.1 fL (ref 80.0–100.0)
Monocytes Absolute: 0.6 K/uL (ref 0.1–1.0)
Monocytes Relative: 7 %
Neutro Abs: 5.3 K/uL (ref 1.7–7.7)
Neutrophils Relative %: 70 %
Platelets: 329 K/uL (ref 150–400)
RBC: 4.47 MIL/uL (ref 4.22–5.81)
RDW: 14.1 % (ref 11.5–15.5)
WBC: 7.7 K/uL (ref 4.0–10.5)
nRBC: 0 % (ref 0.0–0.2)

## 2024-04-25 LAB — PRO BRAIN NATRIURETIC PEPTIDE: Pro Brain Natriuretic Peptide: 55.8 pg/mL

## 2024-04-25 LAB — D-DIMER, QUANTITATIVE: D-Dimer, Quant: 0.33 ug{FEU}/mL (ref 0.00–0.50)

## 2024-04-25 MED ORDER — OXYCODONE-ACETAMINOPHEN 5-325 MG PO TABS
1.0000 | ORAL_TABLET | Freq: Once | ORAL | Status: AC
  Administered 2024-04-25: 1 via ORAL
  Filled 2024-04-25: qty 1

## 2024-04-25 MED ORDER — DOXYCYCLINE HYCLATE 100 MG PO CAPS: 100.0000 mg | ORAL_CAPSULE | Freq: Two times a day (BID) | ORAL | 0 refills | Status: AC

## 2024-04-25 MED ORDER — AMOXICILLIN-POT CLAVULANATE 875-125 MG PO TABS: 1.0000 | ORAL_TABLET | Freq: Two times a day (BID) | ORAL | 0 refills | Status: AC

## 2024-04-25 MED ORDER — IPRATROPIUM-ALBUTEROL 0.5-2.5 (3) MG/3ML IN SOLN
3.0000 mL | Freq: Once | RESPIRATORY_TRACT | Status: AC
  Administered 2024-04-25: 3 mL via RESPIRATORY_TRACT
  Filled 2024-04-25: qty 3

## 2024-04-25 MED ORDER — PREDNISONE 50 MG PO TABS: 50.0000 mg | ORAL_TABLET | Freq: Every day | ORAL | 0 refills | Status: AC

## 2024-04-25 NOTE — ED Notes (Signed)
 ED Provider at bedside.
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
## 2024-04-25 NOTE — Discharge Instructions (Signed)
 We evaluated you for your cough.  Your CT scan shows an area of inflammation in your left lower lung.  This could be a pneumonia but it could also be inflammation.  We did not see any mass but it is possible this could an unusual type of tumor.  We have treated you with antibiotics.  Please take this as prescribed.  It is very important to follow-up with a lung specialist.  Please call [place] [facility] for further follow-up.  You may need a biopsy.  Please also follow-up with your primary doctor.  We also evaluated you for your foot wound.  You have a type of callus called a corn.  You may need to have this removed.  You can follow-up with Dr. Malvin with podiatry.  Please return for any new or worsening symptoms.

## 2024-04-25 NOTE — ED Triage Notes (Addendum)
 Pt c/o SOB, cough, congestion, bilateral foot pain, and lower pain xa couple weeks.  Pain score 8/10.    Pt reports he thinks he has something stuck in his L foot.       Pt was told at Urgent Care that he has a mass in his lung.

## 2024-05-12 ENCOUNTER — Encounter: Admitting: Internal Medicine

## 2024-05-12 NOTE — Progress Notes (Unsigned)
 "   [name], male    DOB: [birth year]    MRN: [id number]   Brief patient profile:  64  yo*** *** referred to pulmonary clinic in [place]  05/12/2024 by *** for ***   Pt   previously seen by PCCM service 05/07/21 [place] for nec pna  History of Present Illness  05/12/2024  Pulmonary/ 1st office eval/ [place] / [place] [facility]  No chief complaint on file.    Dyspnea:  *** Cough: *** Sleep: *** SABA use: *** 02: *** LDSCT:***  No obvious day to day or daytime pattern/variability or assoc excess/ purulent sputum or mucus plugs or hemoptysis or cp or chest tightness, subjective wheeze or overt sinus or hb symptoms.    Also denies any obvious fluctuation of symptoms with weather or environmental changes or other aggravating or alleviating factors except as outlined above   No unusual exposure hx or h/o childhood pna/ asthma or knowledge of premature birth.  Current Allergies, Complete Past Medical History, Past Surgical History, Family History, and Social History were reviewed in [employer] record.  ROS  The following are not active complaints unless bolded Hoarseness, sore throat, dysphagia, dental problems, itching, sneezing,  nasal congestion or discharge of excess mucus or purulent secretions, ear ache,   fever, chills, sweats, unintended wt loss or wt gain, classically pleuritic or exertional cp,  orthopnea pnd or arm/hand swelling  or leg swelling, presyncope, palpitations, abdominal pain, anorexia, nausea, vomiting, diarrhea  or change in bowel habits or change in bladder habits, change in stools or change in urine, dysuria, hematuria,  rash, arthralgias, visual complaints, headache, numbness, weakness or ataxia or problems with walking or coordination,  change in mood or  memory.            Outpatient Medications Prior to Visit  Medication Sig Dispense Refill   albuterol  (VENTOLIN  HFA) 108 (90 Base) MCG/ACT inhaler Inhale 1-2 puffs into the lungs every 6  (six) hours as needed for wheezing or shortness of breath. (Patient not taking: Reported on 12/02/2023) 8 g 0   amoxicillin -clavulanate (AUGMENTIN ) 875-125 MG tablet Take 1 tablet by mouth every 12 (twelve) hours. 14 tablet 0   atorvastatin  (LIPITOR) 10 MG tablet Take 10 mg by mouth daily.     BREZTRI AEROSPHERE 160-9-4.8 MCG/ACT AERO inhaler Inhale 2 puffs into the lungs daily as needed (shortness of breath).     cyanocobalamin (VITAMIN B12) 500 MCG tablet Take 500 mcg by mouth daily.     milk thistle 175 MG tablet Take 175 mg by mouth daily.     mupirocin  cream (BACTROBAN ) 2 % Apply 1 Application topically 2 (two) times daily. 15 g 1   omeprazole (PRILOSEC) 40 MG capsule Take 40 mg by mouth daily.     rivaroxaban  (XARELTO ) 20 MG TABS tablet Take 20 mg by mouth daily.     No facility-administered medications prior to visit.    Past Medical History:  Diagnosis Date   Anxiety    Arthritis    Asthma    Bipolar disorder (HCC)    Bursitis    Chronic pain    Cirrhosis (HCC)    COPD (chronic obstructive pulmonary disease) (HCC)    Degenerative disk disease    GERD (gastroesophageal reflux disease)    Hepatitis B    Hepatitis C    Hiatal hernia    History of pneumonia    PE (pulmonary embolism) 2012   Pneumonia    Protein S deficiency  Schizophrenia (HCC)    Stroke [facility])    TIA   2015      Objective:     There were no vitals taken for this visit.         Assessment       "

## 2024-06-08 ENCOUNTER — Ambulatory Visit: Admitting: Internal Medicine
